# Patient Record
Sex: Male | Born: 1935 | Race: White | Hispanic: No | Marital: Married | Smoking: Former smoker
Health system: Southern US, Community
[De-identification: ages and names within clinical notes are randomized; demographics above are authoritative.]

## PROBLEM LIST (undated history)

## (undated) ENCOUNTER — Emergency Department (HOSPITAL_COMMUNITY): Admission: EM | Payer: Medicare Other | Source: Home / Self Care

## (undated) DIAGNOSIS — G4733 Obstructive sleep apnea (adult) (pediatric): Secondary | ICD-10-CM

## (undated) DIAGNOSIS — I219 Acute myocardial infarction, unspecified: Secondary | ICD-10-CM

## (undated) DIAGNOSIS — G4730 Sleep apnea, unspecified: Secondary | ICD-10-CM

## (undated) DIAGNOSIS — Z8719 Personal history of other diseases of the digestive system: Secondary | ICD-10-CM

## (undated) DIAGNOSIS — I82A12 Acute embolism and thrombosis of left axillary vein: Secondary | ICD-10-CM

## (undated) DIAGNOSIS — E7800 Pure hypercholesterolemia, unspecified: Secondary | ICD-10-CM

## (undated) DIAGNOSIS — I5030 Unspecified diastolic (congestive) heart failure: Secondary | ICD-10-CM

## (undated) DIAGNOSIS — G709 Myoneural disorder, unspecified: Secondary | ICD-10-CM

## (undated) DIAGNOSIS — I4891 Unspecified atrial fibrillation: Secondary | ICD-10-CM

## (undated) DIAGNOSIS — J069 Acute upper respiratory infection, unspecified: Secondary | ICD-10-CM

## (undated) DIAGNOSIS — N183 Chronic kidney disease, stage 3 unspecified: Secondary | ICD-10-CM

## (undated) DIAGNOSIS — J449 Chronic obstructive pulmonary disease, unspecified: Secondary | ICD-10-CM

## (undated) DIAGNOSIS — I2699 Other pulmonary embolism without acute cor pulmonale: Secondary | ICD-10-CM

## (undated) DIAGNOSIS — K254 Chronic or unspecified gastric ulcer with hemorrhage: Secondary | ICD-10-CM

## (undated) DIAGNOSIS — T7840XA Allergy, unspecified, initial encounter: Secondary | ICD-10-CM

## (undated) DIAGNOSIS — H269 Unspecified cataract: Secondary | ICD-10-CM

## (undated) DIAGNOSIS — R0602 Shortness of breath: Secondary | ICD-10-CM

## (undated) DIAGNOSIS — E119 Type 2 diabetes mellitus without complications: Secondary | ICD-10-CM

## (undated) DIAGNOSIS — E782 Mixed hyperlipidemia: Secondary | ICD-10-CM

## (undated) DIAGNOSIS — K219 Gastro-esophageal reflux disease without esophagitis: Secondary | ICD-10-CM

## (undated) DIAGNOSIS — C61 Malignant neoplasm of prostate: Secondary | ICD-10-CM

## (undated) DIAGNOSIS — Z5189 Encounter for other specified aftercare: Secondary | ICD-10-CM

## (undated) DIAGNOSIS — I5042 Chronic combined systolic (congestive) and diastolic (congestive) heart failure: Secondary | ICD-10-CM

## (undated) DIAGNOSIS — I2510 Atherosclerotic heart disease of native coronary artery without angina pectoris: Secondary | ICD-10-CM

## (undated) DIAGNOSIS — I10 Essential (primary) hypertension: Secondary | ICD-10-CM

## (undated) DIAGNOSIS — IMO0001 Reserved for inherently not codable concepts without codable children: Secondary | ICD-10-CM

## (undated) DIAGNOSIS — K269 Duodenal ulcer, unspecified as acute or chronic, without hemorrhage or perforation: Secondary | ICD-10-CM

## (undated) DIAGNOSIS — N200 Calculus of kidney: Secondary | ICD-10-CM

## (undated) HISTORY — DX: Obstructive sleep apnea (adult) (pediatric): G47.33

## (undated) HISTORY — DX: Allergy, unspecified, initial encounter: T78.40XA

## (undated) HISTORY — DX: Sleep apnea, unspecified: G47.30

## (undated) HISTORY — DX: Mixed hyperlipidemia: E78.2

## (undated) HISTORY — DX: Other pulmonary embolism without acute cor pulmonale: I26.99

## (undated) HISTORY — DX: Encounter for other specified aftercare: Z51.89

## (undated) HISTORY — DX: Gastro-esophageal reflux disease without esophagitis: K21.9

## (undated) HISTORY — DX: Chronic kidney disease, stage 3 unspecified: N18.30

## (undated) HISTORY — DX: Type 2 diabetes mellitus without complications: E11.9

## (undated) HISTORY — PX: CATARACT EXTRACTION W/ INTRAOCULAR LENS  IMPLANT, BILATERAL: SHX1307

## (undated) HISTORY — DX: Acute myocardial infarction, unspecified: I21.9

## (undated) HISTORY — PX: MELANOMA EXCISION: SHX5266

## (undated) HISTORY — DX: Chronic kidney disease, stage 3 (moderate): N18.3

## (undated) HISTORY — PX: SINUS ENDO W/FUSION: SHX777

## (undated) HISTORY — DX: Unspecified cataract: H26.9

## (undated) HISTORY — DX: Malignant neoplasm of prostate: C61

## (undated) HISTORY — DX: Chronic or unspecified gastric ulcer with hemorrhage: K25.4

## (undated) HISTORY — DX: Duodenal ulcer, unspecified as acute or chronic, without hemorrhage or perforation: K26.9

## (undated) HISTORY — DX: Unspecified atrial fibrillation: I48.91

## (undated) HISTORY — DX: Calculus of kidney: N20.0

---

## 1979-01-30 HISTORY — PX: URETEROSCOPY: SHX842

## 1984-05-31 HISTORY — PX: LOBECTOMY: SHX5089

## 1984-05-31 HISTORY — PX: LUNG SURGERY: SHX703

## 1990-05-31 DIAGNOSIS — C61 Malignant neoplasm of prostate: Secondary | ICD-10-CM

## 1990-05-31 HISTORY — DX: Malignant neoplasm of prostate: C61

## 2005-05-31 DIAGNOSIS — G4733 Obstructive sleep apnea (adult) (pediatric): Secondary | ICD-10-CM

## 2005-05-31 HISTORY — DX: Obstructive sleep apnea (adult) (pediatric): G47.33

## 2005-10-14 ENCOUNTER — Ambulatory Visit (HOSPITAL_COMMUNITY): Admission: RE | Admit: 2005-10-14 | Discharge: 2005-10-14 | Payer: Self-pay | Admitting: Urology

## 2007-10-20 ENCOUNTER — Encounter: Payer: Self-pay | Admitting: Internal Medicine

## 2008-01-18 ENCOUNTER — Encounter: Payer: Self-pay | Admitting: Internal Medicine

## 2008-02-29 ENCOUNTER — Encounter: Payer: Self-pay | Admitting: Internal Medicine

## 2008-04-04 ENCOUNTER — Encounter: Payer: Self-pay | Admitting: Internal Medicine

## 2008-04-11 ENCOUNTER — Encounter: Admission: RE | Admit: 2008-04-11 | Discharge: 2008-04-11 | Payer: Self-pay | Admitting: Internal Medicine

## 2008-05-13 ENCOUNTER — Ambulatory Visit: Payer: Self-pay | Admitting: Internal Medicine

## 2008-05-13 DIAGNOSIS — R599 Enlarged lymph nodes, unspecified: Secondary | ICD-10-CM | POA: Insufficient documentation

## 2008-05-13 DIAGNOSIS — K219 Gastro-esophageal reflux disease without esophagitis: Secondary | ICD-10-CM | POA: Insufficient documentation

## 2008-05-13 DIAGNOSIS — R0602 Shortness of breath: Secondary | ICD-10-CM | POA: Insufficient documentation

## 2008-05-13 DIAGNOSIS — R05 Cough: Secondary | ICD-10-CM

## 2008-05-13 DIAGNOSIS — G4733 Obstructive sleep apnea (adult) (pediatric): Secondary | ICD-10-CM | POA: Insufficient documentation

## 2008-05-13 DIAGNOSIS — R058 Other specified cough: Secondary | ICD-10-CM | POA: Insufficient documentation

## 2008-06-17 ENCOUNTER — Ambulatory Visit: Payer: Self-pay | Admitting: Gastroenterology

## 2008-06-17 ENCOUNTER — Ambulatory Visit: Payer: Self-pay | Admitting: Internal Medicine

## 2008-06-17 LAB — CONVERTED CEMR LAB
BUN: 17 mg/dL (ref 6–23)
Chloride: 105 meq/L (ref 96–112)
GFR calc non Af Amer: 58 mL/min
Glucose, Bld: 108 mg/dL — ABNORMAL HIGH (ref 70–99)
Potassium: 4.3 meq/L (ref 3.5–5.1)

## 2008-07-02 ENCOUNTER — Ambulatory Visit: Payer: Self-pay | Admitting: Gastroenterology

## 2008-07-03 ENCOUNTER — Telehealth: Payer: Self-pay | Admitting: Gastroenterology

## 2008-07-31 ENCOUNTER — Ambulatory Visit: Payer: Self-pay | Admitting: Gastroenterology

## 2008-07-31 DIAGNOSIS — K222 Esophageal obstruction: Secondary | ICD-10-CM | POA: Insufficient documentation

## 2008-08-05 ENCOUNTER — Ambulatory Visit: Payer: Self-pay | Admitting: Internal Medicine

## 2008-08-12 ENCOUNTER — Ambulatory Visit: Payer: Self-pay | Admitting: Internal Medicine

## 2008-08-12 DIAGNOSIS — J019 Acute sinusitis, unspecified: Secondary | ICD-10-CM | POA: Insufficient documentation

## 2008-09-03 ENCOUNTER — Encounter: Payer: Self-pay | Admitting: Internal Medicine

## 2008-10-15 ENCOUNTER — Encounter: Payer: Self-pay | Admitting: Internal Medicine

## 2008-11-11 ENCOUNTER — Telehealth: Payer: Self-pay | Admitting: Gastroenterology

## 2008-11-11 ENCOUNTER — Encounter: Payer: Self-pay | Admitting: Internal Medicine

## 2009-03-25 ENCOUNTER — Ambulatory Visit: Payer: Self-pay | Admitting: Internal Medicine

## 2009-03-25 LAB — CONVERTED CEMR LAB
BUN: 21 mg/dL (ref 6–23)
CO2: 27 meq/L (ref 19–32)
Chloride: 102 meq/L (ref 96–112)
Creatinine, Ser: 1.1 mg/dL (ref 0.4–1.5)

## 2009-03-26 ENCOUNTER — Ambulatory Visit: Payer: Self-pay | Admitting: Internal Medicine

## 2009-04-03 ENCOUNTER — Ambulatory Visit: Payer: Self-pay | Admitting: Internal Medicine

## 2009-04-15 ENCOUNTER — Encounter: Payer: Self-pay | Admitting: Internal Medicine

## 2009-06-17 ENCOUNTER — Ambulatory Visit (HOSPITAL_COMMUNITY): Admission: RE | Admit: 2009-06-17 | Discharge: 2009-06-17 | Payer: Self-pay | Admitting: Urology

## 2009-07-10 ENCOUNTER — Encounter: Payer: Self-pay | Admitting: Internal Medicine

## 2009-07-18 ENCOUNTER — Telehealth: Payer: Self-pay | Admitting: Internal Medicine

## 2009-07-18 ENCOUNTER — Encounter: Payer: Self-pay | Admitting: Internal Medicine

## 2009-09-10 ENCOUNTER — Ambulatory Visit: Payer: Self-pay | Admitting: Internal Medicine

## 2009-09-30 ENCOUNTER — Emergency Department (HOSPITAL_COMMUNITY): Admission: EM | Admit: 2009-09-30 | Discharge: 2009-09-30 | Payer: Self-pay | Admitting: Emergency Medicine

## 2009-10-08 ENCOUNTER — Encounter: Payer: Self-pay | Admitting: Internal Medicine

## 2009-10-10 ENCOUNTER — Telehealth: Payer: Self-pay | Admitting: Internal Medicine

## 2010-06-21 ENCOUNTER — Encounter: Payer: Self-pay | Admitting: Urology

## 2010-06-30 NOTE — Consult Note (Signed)
Summary: [facility]  [facility]   Imported By: Lester Melvindale 07/24/2009 07:46:47  _____________________________________________________________________  External Attachment:    Type:   Image     Comment:   External Document

## 2010-06-30 NOTE — Letter (Signed)
Summary: [facility]  [facility]   Imported By: Sherian Rein 11/03/2009 13:52:18  _____________________________________________________________________  External Attachment:    Type:   Image     Comment:   External Document

## 2010-06-30 NOTE — Progress Notes (Signed)
Summary: check on pt after ER visit  Phone Note Outgoing Call   Call placed by: Carron Curie CMA,  Oct 10, 2009 1:31 PM Call placed to: Patient Summary of Call: MR received an ER note stating pt came into [facility] ER on 10-01-09. MR wanted to check on pt. Pt states he went to ER because he had broken out in a rash and was itching, and thinks it was due to some meds. He was palced on prednisone and an antihist and staes he is doing much better. He also states his cough has imrpved since last OV as well.  Initial call taken by: Carron Curie CMA,  Oct 10, 2009 1:32 PM  Follow-up for Phone Call        thanks Follow-up by: Kalman Shan MD,  Oct 11, 2009 6:36 PM

## 2010-06-30 NOTE — Letter (Signed)
Summary: Flo Shanks MD  Flo Shanks MD   Imported By: Lester [place] 07/24/2009 07:51:13  _____________________________________________________________________  External Attachment:    Type:   Image     Comment:   External Document

## 2010-06-30 NOTE — Progress Notes (Signed)
Summary: return Dr.'s call  Phone Note From Other Clinic Call back at [phone]   Caller: Dr. Lazarus Salines Call For: Jon White Summary of Call: please call Dr. Lazarus Salines at office on Monday 07-21-2009 after 1:30pm.  Dr.wolicki aware MR not in office until Monday pm.  Offered to page MR for him. Initial call taken by: Eugene Gavia,  July 18, 2009 1:37 PM  Follow-up for Phone Call        will forward message to MR box to make him aware [address] [facility]  July 18, 2009 2:03 PM   Additional Follow-up for Phone Call Additional follow up Details #1::        I spoke to Dr. Lazarus Salines. His questionwas if sinus surgery would help his lungs. I reviewd past med hx on patient. His PFTs were normal but hi main issue sinus drainage exacerbating dyspna. DR Lazarus Salines said he will put that in context and decide on sinus surgery Additional Follow-up by: Kalman Shan MD,  July 18, 2009 4:54 PM

## 2010-06-30 NOTE — Assessment & Plan Note (Signed)
Summary: F/U pt requested this date///kp   Visit Type:  Follow-up Copy to:  Dr. Jolene Provost Primary Provider/Referring Provider:  Dr Georgianne Fick PMD, DR [facility], Dr. Arlyce Dice GI, Dr Bonneau Callas - Allergy  CC:  6 month follow-up.  Pt states sob and cough has improved. He states he is 90% better. [name] Kitchen  History of Present Illness: OV 09/02/2009: Followup for Multifactorial Cough (significant sinus drainage, GERD, and cough variant asthma),  Last visits March and November 2010. Since then has seen Dr. Lazarus Salines who is contemplating sinus surgery if medical Rx fails. He has continued nasal steroid ciclesonide, daily saline wash via nettpot. However, he did see Dr. Artesia Callas for allergy eval. Reportedly skin testing mildly positive for mold but otherwise negative. Was started on singulair. Afer that sinus drainage is "90% better". Able to cope with current levels of sinus drainage. Using tissue only 4-5 times per day as oppsed to >10-20 times per day. Re: GERD. He is not on PPI. Still having some cough and tickle in throat after he eats. Wants to get rid of it. Does not think this element of cough is related to sinus drainage. No other complaints. No dyspnea. Contnues with symbicort. Due to go back to [facility] for 5 months starting October 29, 2009  Current Medications (verified): 1)  Adult Aspirin Ec Low Strength 81 Mg Tbec (Aspirin) .... Once Daily 2)  Lipitor 10 Mg Tabs (Atorvastatin Calcium) .... Once Daily 3)  Symbicort 160-4.5 Mcg/act Aero (Budesonide-Formoterol Fumarate) .... 2 Puffs Twice Daily 4)  Omnaris 50 Mcg/act Susp (Ciclesonide) .... As Directed 5)  Singulair 10 Mg Tabs (Montelukast Sodium) .... Take 1 Tab By Mouth At Bedtime  Allergies (verified): 1)  ! 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  Spends winters in [place], [place] Has 2 kids in [place] Quit smoking only in 2009 Alcohol Use - yes Daily Caffeine Use  Risk Factors: Caffeine Use: 2 (06/17/2008)  Risk Factors: Smoking Status: quit (05/13/2008)  Past Medical History: #Sleep Apnea x on CPAP. Diagnosed in 2007. -> improved tolerance with chlorpheniramine 04/2008 #Prostate cancer 1992 x in remission. s/p XRT #Renal stones #Mediastinal Nodes > CT chest: 04/11/2008 at [place] imaging : Centrilobular emphysema,   Mild mediastinal and bihilar lymph nodes (9mm), 1cm subcarinal node (im 31), 2nd subcarinal node 9mm,  small hiatal hernia,  diffuse esophageal thickening > mediastinal nodes resolved on CT 03/26/2009  #LLL subpleural nodule > unchanged on CT 03/26/2009. 1 year stability  #Creat 1.3mg % on 04/04/2008  #Normal stress echo  #Allergy skin testing negative per hx...[name] KitchenMarland KitchenFeb 2011.[name] KitchenMarland KitchenDr Astor Callas  Past Surgical History: Reviewed history from 05/13/2008 and no changes required. Llung surgery 1986 - presumed from RLL. Segmentectomy. Hx is that of Solitary Nodule. Benign Nodule   Family History: Reviewed history from 06/17/2008 and no changes required. Family History of Prostate Cancer: 3 brothers/self No FH of Colon Cancer:  Social History: Reviewed history from 06/17/2008 and no changes required. 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  Spends winters in [place], [place] Has 2 kids in [place] Quit smoking only in 2009 Alcohol Use - yes Daily Caffeine Use  Review of Systems       The patient complains of shortness of breath with activity and non-productive cough.  The  patient denies shortness of breath at rest, productive cough, coughing up blood, chest pain, irregular heartbeats, acid heartburn, indigestion, loss of appetite, weight change, abdominal pain, difficulty swallowing, sore throat,  tooth/dental problems, headaches, nasal congestion/difficulty breathing through nose, sneezing, itching, ear ache, anxiety, depression, hand/feet swelling, joint stiffness or pain, rash, change in color of mucus, and fever.    Vital Signs:  Patient profile:   77 year old male Height:      68 inches Weight:      159.50 pounds BMI:     24.34 O2 Sat:      97 % on Room air Temp:     97.8 degrees F oral Pulse rate:   69 / minute BP sitting:   116 / 68  (right arm) Cuff size:   regular  Vitals Entered By: Carron Curie CMA (September 10, 2009 9:55 AM)  O2 Flow:  Room air CC: 6 month follow-up.  Pt states sob and cough has improved. He states he is 90% better.  Comments Medications reviewed with patient Carron Curie CMA  September 10, 2009 9:58 AM Daytime phone number verified with patient.    Physical Exam  General:  well developed, well nourished, in no acute distress Head:  normocephalic and atraumatic Eyes:  PERRLA/EOM intact; conjunctiva and sclera clear Ears:  TMs intact and clear with normal canals Nose:  no deformity, discharge, inflammation, or lesions Mouth:  dentures Melampatti Class II.   Neck:  no masses, thyromegaly, or abnormal cervical nodes Chest Wall:  no deformities noted Lungs:  clear bilaterally to auscultation and percussion Heart:  regular rate and rhythm, S1, S2 without murmurs, rubs, gallops, or clicks Abdomen:  bowel sounds positive; abdomen soft and non-tender without masses, or organomegaly Msk:  no deformity or scoliosis noted with normal posture Pulses:  pulses normal Extremities:  no clubbing, cyanosis, edema, or deformity noted Neurologic:  CN II-XII grossly intact with normal reflexes, coordination, muscle strength and tone Skin:  intact without lesions or rashes Cervical Nodes:  no significant adenopathy Axillary Nodes:  no significant adenopathy Psych:  alert and cooperative; normal mood and affect; normal attention span and  concentration   Impression & Recommendations:  Problem # 1:  COUGH (ICD-786.2) Assessment Improved  Etiologys a) elated to sinus drainage and mucus; b) Silent GERD  (he has hiatal hernia & esophagitis on ct chest 02/2008 + hx of hiccups + hx of intermittent dysphagia -.sp eso dilataition 07/2008; and c)  Cough variant asthma coexisting based on recurrence of chest tightness after dc of symbicort 07/2008  > On 09/10/2009: improved control after Dr. [place] Callas added singulair to regimen in Mid -feb 2011. Stil lhaving some cough that is likely due to GERD versus Habit cough. He is not on PPI and wonder why. He is not concerned about aspirin as cause and is continuing it  plan Sinus per Dr Lazarus Salines nad Dr. [place] Callas GI per Dr Arlyce Dice but will add PPI nexium Cough Variant asthma - contnue symbicort He will call in 1 month. If element of cough that is thought as due to GERD is betteer then we will continue PPI. Otherwise, will consider neurontin for habit cough Will get records from DR sharma  Orders: Est. Patient Level III (60454)  Medications Added to Medication List This Visit: 1)  Omnaris 50 Mcg/act Susp (Ciclesonide) .... As directed 2)  Singulair 10 Mg Tabs (Montelukast sodium) .... Take 1 tab by mouth at bedtime 3)  Nexium 40 Mg Cpdr (Esomeprazole magnesium) .... By  mouth daily. take one half hour before eating.  Other Orders: Prescription Created Electronically [phone])  Patient Instructions: 1)  continue your medicines 2)  add nexium to your current regimen 3)  take it on empty stomach 4)  follow GERD diet advice 5)  Call me in 1 month to see if cough even better 6)  Depending on your call I will advise you next step Prescriptions: NEXIUM 40 MG  CPDR (ESOMEPRAZOLE MAGNESIUM) By mouth daily. Take one half hour before eating.  #30 x 3   Entered and Authorized by:   Kalman Shan MD   Signed by:   Kalman Shan MD on 09/10/2009   Method used:   Electronically to        [facility]  [phone]* (retail)       [address]       [place], [place]  [address]       Ph: [phone] or [phone]       Fax: [phone]   RxID:   [id number]    Immunization History:  Pneumovax Immunization History:    Pneumovax:  pneumovax (08/30/2003)

## 2011-05-11 ENCOUNTER — Encounter: Payer: Self-pay | Admitting: Pulmonary Disease

## 2011-05-12 ENCOUNTER — Encounter: Payer: Self-pay | Admitting: Pulmonary Disease

## 2011-05-12 ENCOUNTER — Ambulatory Visit (INDEPENDENT_AMBULATORY_CARE_PROVIDER_SITE_OTHER): Payer: Medicare Other | Admitting: Pulmonary Disease

## 2011-05-12 VITALS — BP 108/68 | HR 67 | Temp 97.5°F | Ht 68.0 in | Wt 161.4 lb

## 2011-05-12 DIAGNOSIS — G4733 Obstructive sleep apnea (adult) (pediatric): Secondary | ICD-10-CM

## 2011-05-12 NOTE — Patient Instructions (Signed)
Will have your DME check your machine, mask fit, and possibly re-optimize your pressure with an "auto device".  Please call me after they download the auto device so that we can call and get them to fax the report.

## 2011-05-12 NOTE — Progress Notes (Signed)
Subjective:    Patient ID: Jon White, male    DOB: [birth year], 77 y.o.   MRN: [id number]  HPI The patient is a 77 year old male who I've been asked to see for management of obstructive sleep apnea.  The patient was diagnosed with mild sleep apnea in 2008, where he had an AHI of 11 events per hour.  He was started on CPAP optimized to 8 cm of water pressure, and did fairly well with the device.  About a year ago or more, his wife began to notice breakthrough snoring, and the patient began to feel unrested with inappropriate daytime sleepiness.  He believes that his machine is functioning properly, and he uses a full face mask with appropriate changing of cushions.  He does have a mask leak at times, but does not feel this is a big issue.  He typically will wear the CPAP at least 8 hours a night.  He does not feel rested in the mornings upon awakening, and notes significant daytime sleepiness with periods of inactivity during the day.  He will get sleepy reading or watching television if not stimulated.  He denies any sleepiness with driving.  The patient states that his weight is unchanged since his last study, and his Epworth score today is 8.  Sleep Questionnaire: What time do you typically go to bed?( Between what hours) 10 pm to 11 pm How long does it take you to fall asleep? 15 to 30 mins How many times during the night do you wake up? 2 What time do you get out of bed to start your day? 0900 Do you drive or operate heavy machinery in your occupation? No How much has your weight changed (up or down) over the past two years? (In pounds) 0 oz (0 kg) Have you ever had a sleep study before? Yes If yes, location of study? [facility] If yes, date of study? Feb 2008 Do you currently use CPAP? Yes If so, what pressure? 8 cm Do you wear oxygen at any time? No    Review of Systems  Constitutional: Negative for fever and unexpected weight change.  HENT: Positive for congestion, sneezing and  trouble swallowing. Negative for ear pain, nosebleeds, sore throat, rhinorrhea, dental problem, postnasal drip and sinus pressure.   Eyes: Negative for redness and itching.  Respiratory: Positive for shortness of breath. Negative for cough, chest tightness and wheezing.   Cardiovascular: Negative for palpitations and leg swelling.  Gastrointestinal: Negative for nausea and vomiting.  Genitourinary: Negative for dysuria.  Musculoskeletal: Positive for joint swelling.  Skin: Positive for rash.  Neurological: Negative for headaches.  Hematological: Does not bruise/bleed easily.  Psychiatric/Behavioral: Negative for dysphoric mood. The patient is not nervous/anxious.        Objective:   Physical Exam Constitutional:  Well developed, no acute distress  HENT:  Nares with narrowing, some septal deviation, obstruction due to debris and ?polyps?  Oropharynx without exudate, palate and uvula are elongated and thickened.   Eyes:  Perrla, eomi, no scleral icterus  Neck:  No JVD, no TMG  Cardiovascular:  Normal rate, regular rhythm, no rubs or gallops.  No murmurs        Intact distal pulses  Pulmonary :  Normal breath sounds, no stridor or respiratory distress   No rales, rhonchi, or wheezing  Abdominal:  Soft, nondistended, bowel sounds present.  No tenderness noted.   Musculoskeletal:  No significant lower extremity edema noted.  Lymph Nodes:  No cervical  lymphadenopathy noted  Skin:  No cyanosis noted  Neurologic:  Appears sleepy, but appropriate, moves all 4 extremities without obvious deficit.         Assessment & Plan:

## 2011-05-12 NOTE — Assessment & Plan Note (Signed)
The patient has a history of mild sleep apnea, and is being treated with CPAP.  He initially did very well with the device, but for at least the last year his wife has noticed breakthrough snoring, as well as increased daytime sleepiness.  It is unclear if this is due to a malfunction in his machine, a leak in the system, mask leaks, or possibly that he needs a higher pressure.  We'll have his DME check the functioning of his machine, as well as his mask fit.  If everything appears to be in order, will optimize his pressure for him at home with an automatic device for 2-3 weeks.  If he continues to have sleepiness during the day despite optimization of his sleep apnea treatment, would consider whether there are other issues leading to his daytime sleepiness.  He was found to have large numbers of leg jerks on his sleep study, and raises the question of a primary movement disorder of sleep.

## 2011-05-18 ENCOUNTER — Telehealth: Payer: Self-pay | Admitting: Pulmonary Disease

## 2011-05-18 NOTE — Telephone Encounter (Signed)
LMOAM for [facility] to return my call on status of order.

## 2011-05-18 NOTE — Telephone Encounter (Signed)
Per rhonda send to her and she will call [facility]

## 2011-05-19 NOTE — Telephone Encounter (Signed)
Spoke with Fayrene Fearing at [employer]. [facility] has been having problems with their fax. According to Fayrene Fearing he did not receive our order. I re-faxed the order and asked him to contact Mr. Cecena today to get this arranged. Fayrene Fearing stated that he would get this taken care of today. Called patient at the below number and went straight to voice mail. LMOAM for Mr. Coppedge to call me at [phone] if he didn't hear from [facility] by 3:00 pm today. Will follow back up with patient after 3:00. Fayrene Fearing with [facility] will send [place] a fax once the order has been addressed.

## 2011-05-20 NOTE — Telephone Encounter (Signed)
Spoke to pt he did hear from [facility]

## 2011-06-10 ENCOUNTER — Telehealth: Payer: Self-pay | Admitting: Pulmonary Disease

## 2011-06-10 DIAGNOSIS — G4733 Obstructive sleep apnea (adult) (pediatric): Secondary | ICD-10-CM

## 2011-06-10 NOTE — Telephone Encounter (Signed)
Pt is calling me to inform me [facility] did auto download so i can be on the look out for this.  LMOM for pt informing him I got this message and I will keep an eye out for his results and we will call him back.

## 2011-06-15 NOTE — Telephone Encounter (Signed)
Called [facility] at [phone] and [facility] requesting download as I still have not seen it come across my desk yet.

## 2011-06-16 NOTE — Telephone Encounter (Signed)
Received download from [facility] and put in [facility] VIP folder.   Called and spoke with pt's wife and informed her we received results and we will call her back once he reviews the results and let her know KC's recs.  Wife was ok with this.

## 2011-06-16 NOTE — Telephone Encounter (Signed)
Calling again in reference to previous message can be reached at [phone].Jon White

## 2011-06-17 NOTE — Telephone Encounter (Signed)
Please let pt know that his optimal pressure is 12cm.  Please let pcc know so they can send order to dme. Pt is to call us if he is not seeing improvement in his sleep on this pressure after 4 weeks.

## 2011-06-17 NOTE — Telephone Encounter (Signed)
Order sent to [facility] Spoke with pt and notified of recs per [facility] Pt verbalized understanding and is aware to call us after 4 wks if not improving with sleep.

## 2011-06-22 ENCOUNTER — Other Ambulatory Visit: Payer: Self-pay | Admitting: Pulmonary Disease

## 2011-06-22 DIAGNOSIS — G4733 Obstructive sleep apnea (adult) (pediatric): Secondary | ICD-10-CM

## 2011-06-30 ENCOUNTER — Other Ambulatory Visit (INDEPENDENT_AMBULATORY_CARE_PROVIDER_SITE_OTHER): Payer: Self-pay | Admitting: Otolaryngology

## 2011-06-30 DIAGNOSIS — J310 Chronic rhinitis: Secondary | ICD-10-CM | POA: Diagnosis not present

## 2011-06-30 DIAGNOSIS — J343 Hypertrophy of nasal turbinates: Secondary | ICD-10-CM | POA: Diagnosis not present

## 2011-06-30 DIAGNOSIS — J320 Chronic maxillary sinusitis: Secondary | ICD-10-CM | POA: Diagnosis not present

## 2011-06-30 DIAGNOSIS — J330 Polyp of nasal cavity: Secondary | ICD-10-CM | POA: Diagnosis not present

## 2011-07-05 ENCOUNTER — Ambulatory Visit
Admission: RE | Admit: 2011-07-05 | Discharge: 2011-07-05 | Disposition: A | Discharge status: Home / Self Care | Payer: Medicare Other | Source: Ambulatory Visit | Attending: Otolaryngology | Admitting: Otolaryngology

## 2011-07-05 DIAGNOSIS — J328 Other chronic sinusitis: Secondary | ICD-10-CM | POA: Diagnosis not present

## 2011-07-19 DIAGNOSIS — C61 Malignant neoplasm of prostate: Secondary | ICD-10-CM | POA: Diagnosis not present

## 2011-07-23 DIAGNOSIS — J343 Hypertrophy of nasal turbinates: Secondary | ICD-10-CM | POA: Diagnosis not present

## 2011-07-23 DIAGNOSIS — J342 Deviated nasal septum: Secondary | ICD-10-CM | POA: Diagnosis not present

## 2011-07-23 DIAGNOSIS — J330 Polyp of nasal cavity: Secondary | ICD-10-CM | POA: Diagnosis not present

## 2011-07-23 DIAGNOSIS — J320 Chronic maxillary sinusitis: Secondary | ICD-10-CM | POA: Diagnosis not present

## 2011-07-26 DIAGNOSIS — C61 Malignant neoplasm of prostate: Secondary | ICD-10-CM | POA: Diagnosis not present

## 2011-08-03 ENCOUNTER — Encounter (HOSPITAL_BASED_OUTPATIENT_CLINIC_OR_DEPARTMENT_OTHER): Payer: Self-pay | Admitting: *Deleted

## 2011-08-03 NOTE — Pre-Procedure Instructions (Signed)
BRING CPAP WITH YOU DOS AND ALL MEDS

## 2011-08-05 ENCOUNTER — Encounter (HOSPITAL_BASED_OUTPATIENT_CLINIC_OR_DEPARTMENT_OTHER)
Admission: RE | Admit: 2011-08-05 | Discharge: 2011-08-05 | Disposition: A | Discharge status: Home / Self Care | Payer: Medicare Other | Source: Ambulatory Visit | Attending: Otolaryngology | Admitting: Otolaryngology

## 2011-08-05 ENCOUNTER — Other Ambulatory Visit (HOSPITAL_COMMUNITY): Payer: Medicare Other

## 2011-08-05 ENCOUNTER — Other Ambulatory Visit: Payer: Self-pay

## 2011-08-05 ENCOUNTER — Ambulatory Visit
Admission: RE | Admit: 2011-08-05 | Discharge: 2011-08-05 | Disposition: A | Discharge status: Home / Self Care | Payer: Medicare Other | Source: Ambulatory Visit | Attending: Otolaryngology | Admitting: Otolaryngology

## 2011-08-05 ENCOUNTER — Other Ambulatory Visit (INDEPENDENT_AMBULATORY_CARE_PROVIDER_SITE_OTHER): Payer: Self-pay | Admitting: Otolaryngology

## 2011-08-05 DIAGNOSIS — Z01811 Encounter for preprocedural respiratory examination: Secondary | ICD-10-CM | POA: Diagnosis not present

## 2011-08-05 DIAGNOSIS — J342 Deviated nasal septum: Secondary | ICD-10-CM | POA: Diagnosis not present

## 2011-08-05 DIAGNOSIS — Z538 Procedure and treatment not carried out for other reasons: Secondary | ICD-10-CM | POA: Diagnosis not present

## 2011-08-05 DIAGNOSIS — Z01810 Encounter for preprocedural cardiovascular examination: Secondary | ICD-10-CM | POA: Diagnosis not present

## 2011-08-09 ENCOUNTER — Ambulatory Visit (HOSPITAL_BASED_OUTPATIENT_CLINIC_OR_DEPARTMENT_OTHER)
Admission: RE | Admit: 2011-08-09 | Discharge: 2011-08-09 | Disposition: A | Discharge status: Home / Self Care | Payer: Medicare Other | Source: Ambulatory Visit | Attending: Otolaryngology | Admitting: Otolaryngology

## 2011-08-09 ENCOUNTER — Encounter (HOSPITAL_BASED_OUTPATIENT_CLINIC_OR_DEPARTMENT_OTHER): Payer: Self-pay | Admitting: Anesthesiology

## 2011-08-09 ENCOUNTER — Other Ambulatory Visit: Payer: Self-pay | Admitting: Otolaryngology

## 2011-08-09 ENCOUNTER — Encounter (HOSPITAL_BASED_OUTPATIENT_CLINIC_OR_DEPARTMENT_OTHER)
Admission: RE | Disposition: A | Discharge status: Home / Self Care | Payer: Self-pay | Source: Ambulatory Visit | Attending: Otolaryngology

## 2011-08-09 ENCOUNTER — Encounter (HOSPITAL_BASED_OUTPATIENT_CLINIC_OR_DEPARTMENT_OTHER): Payer: Self-pay | Admitting: *Deleted

## 2011-08-09 DIAGNOSIS — Z538 Procedure and treatment not carried out for other reasons: Secondary | ICD-10-CM | POA: Insufficient documentation

## 2011-08-09 DIAGNOSIS — J342 Deviated nasal septum: Secondary | ICD-10-CM | POA: Insufficient documentation

## 2011-08-09 DIAGNOSIS — Z01810 Encounter for preprocedural cardiovascular examination: Secondary | ICD-10-CM | POA: Diagnosis not present

## 2011-08-09 HISTORY — DX: Chronic obstructive pulmonary disease, unspecified: J44.9

## 2011-08-09 SURGERY — SEPTOPLASTY, NOSE, WITH NASAL TURBINATE REDUCTION
Anesthesia: General | Laterality: Bilateral

## 2011-08-09 MED ORDER — LACTATED RINGERS IV SOLN: INTRAVENOUS | Status: DC

## 2011-08-09 SURGICAL SUPPLY — 65 items
ATTRACTOMAT 16X20 MAGNETIC DRP (DRAPES) IMPLANT
BLADE ROTATE RAD 12 4 M4 (BLADE) IMPLANT
BLADE ROTATE RAD 40 4 M4 (BLADE) IMPLANT
BLADE ROTATE TRICUT 4X13 M4 (BLADE) IMPLANT
BLADE SURG 15 STRL LF DISP TIS (BLADE) IMPLANT
BLADE SURG 15 STRL SS (BLADE)
BLADE TRICUT ROTATE M4 4 5PK (BLADE) IMPLANT
BUR HS RAD FRONTAL 3 (BURR) IMPLANT
CANISTER SUC SOCK COL 7 IN (MISCELLANEOUS) IMPLANT
CANISTER SUCTION 1200CC (MISCELLANEOUS) IMPLANT
CATH SINUS BALLN 7X16 (CATHETERS) IMPLANT
CATH SINUS BALLN RELIEV 6X16 (SINUPLASTY) IMPLANT
CATH SINUS GUIDE F-70 (CATHETERS) IMPLANT
CATH SINUS GUIDE M/110 (CATHETERS) IMPLANT
CATH SINUS IRRIGATION 2.0 (CATHETERS) IMPLANT
CLOTH BEACON ORANGE TIMEOUT ST (SAFETY) IMPLANT
COAGULATOR SUCT 8FR VV (MISCELLANEOUS) IMPLANT
DECANTER SPIKE VIAL GLASS SM (MISCELLANEOUS) IMPLANT
DEVICE INFLATION 20/61 (MISCELLANEOUS) IMPLANT
DRAPE SURG 17X23 STRL (DRAPES) IMPLANT
DRSG NASAL KENNEDY LMNT 8CM (GAUZE/BANDAGES/DRESSINGS) IMPLANT
DRSG NASOPORE 8CM (GAUZE/BANDAGES/DRESSINGS) IMPLANT
ELECT REM PT RETURN 9FT ADLT (ELECTROSURGICAL)
ELECTRODE REM PT RTRN 9FT ADLT (ELECTROSURGICAL) IMPLANT
GLOVE BIO SURGEON STRL SZ7.5 (GLOVE) IMPLANT
GOWN PREVENTION PLUS XLARGE (GOWN DISPOSABLE) IMPLANT
HANDLE SINUS GUIDE LP (INSTRUMENTS) IMPLANT
HANDPIECE HYDRODEBRIDER FRONT (BLADE) IMPLANT
HEMOSTAT SURGICEL 2X14 (HEMOSTASIS) IMPLANT
IV NS 1000ML (IV SOLUTION)
IV NS 1000ML BAXH (IV SOLUTION) IMPLANT
IV NS 500ML (IV SOLUTION)
IV NS 500ML BAXH (IV SOLUTION) IMPLANT
NEEDLE 27GAX1X1/2 (NEEDLE) IMPLANT
NEEDLE HYPO 25X1 1.5 SAFETY (NEEDLE) IMPLANT
NEEDLE SPNL 25GX3.5 QUINCKE BL (NEEDLE) IMPLANT
NS IRRIG 1000ML POUR BTL (IV SOLUTION) IMPLANT
PACK BASIN DAY SURGERY FS (CUSTOM PROCEDURE TRAY) IMPLANT
PACK ENT DAY SURGERY (CUSTOM PROCEDURE TRAY) IMPLANT
PAD ENT ADHESIVE 25PK (MISCELLANEOUS) IMPLANT
SHEATH ENDOSCRUB 0 DEG (SHEATH) IMPLANT
SHEATH ENDOSCRUB 30 DEG (SHEATH) IMPLANT
SLEEVE SCD COMPRESS KNEE MED (MISCELLANEOUS) IMPLANT
SOLUTION BUTLER CLEAR DIP (MISCELLANEOUS) IMPLANT
SPLINT NASAL DOYLE BI-VL (GAUZE/BANDAGES/DRESSINGS) IMPLANT
SPONGE GAUZE 2X2 8PLY STRL LF (GAUZE/BANDAGES/DRESSINGS) IMPLANT
SPONGE NEURO XRAY DETECT 1X3 (DISPOSABLE) IMPLANT
SUT CHROMIC 4 0 P 3 18 (SUTURE) IMPLANT
SUT ETHILON 3 0 PS 1 (SUTURE) IMPLANT
SUT PLAIN 4 0 ~~LOC~~ 1 (SUTURE) IMPLANT
SUT PROLENE 3 0 PS 2 (SUTURE) IMPLANT
SUT VIC AB 4-0 P-3 18XBRD (SUTURE) IMPLANT
SUT VIC AB 4-0 P3 18 (SUTURE)
SYR 3ML 23GX1 SAFETY (SYRINGE) IMPLANT
SYSTEM HYDRODEBRIDER (MISCELLANEOUS) IMPLANT
SYSTEM RELIEVA LUMA ILLUM (SINUPLASTY) IMPLANT
TOWEL OR 17X24 6PK STRL BLUE (TOWEL DISPOSABLE) IMPLANT
TRACKER ENT INSTRUMENT (MISCELLANEOUS) IMPLANT
TRACKER ENT PATIENT (MISCELLANEOUS) IMPLANT
TUBE CONNECTING 20X1/4 (TUBING) IMPLANT
TUBE SALEM SUMP 12R W/ARV (TUBING) IMPLANT
TUBE SALEM SUMP 16 FR W/ARV (TUBING) IMPLANT
TUBING STRAIGHTSHOT EPS 5PK (TUBING) IMPLANT
WATER STERILE IRR 1000ML POUR (IV SOLUTION) IMPLANT
YANKAUER SUCT BULB TIP NO VENT (SUCTIONS) IMPLANT

## 2011-08-09 NOTE — Progress Notes (Signed)
Patient cancelled at our facility by Dr. Gelene Mink due to airway safety.  Issues and concerns explained to patient and wife by Dr. Gelene Mink.  Patient and  Family understanding. Dr. Suszanne Conners informed by Dr. Gelene Mink.  Attempt to move patient to main OR schedule unsuccessful due to Dr. Suszanne Conners schedule.  Patient advised to call Dr. Suszanne Conners office later in day to reschedule surgery.

## 2011-08-09 NOTE — Consult Note (Signed)
In the course of my pre-op evaluation I concluded that Mr. Blann would require close post-op monitoring. Therefore, he is reccommended to be rescheduled at the main hospital where an appropriate unit exists for this need. Case discussed over the phone with Dr. Suszanne Conners. Family informed of need to delay to provide the patient with optimal care.  Aldona Lento, MD

## 2011-08-11 ENCOUNTER — Encounter (HOSPITAL_BASED_OUTPATIENT_CLINIC_OR_DEPARTMENT_OTHER): Payer: Self-pay | Admitting: Otolaryngology

## 2011-08-25 ENCOUNTER — Telehealth: Payer: Self-pay | Admitting: Pulmonary Disease

## 2011-08-25 DIAGNOSIS — G4733 Obstructive sleep apnea (adult) (pediatric): Secondary | ICD-10-CM

## 2011-08-25 NOTE — Telephone Encounter (Signed)
LMTCB for James 

## 2011-08-26 NOTE — Telephone Encounter (Signed)
Order sent to [facility] and Fayrene Fearing with [facility] aware.

## 2011-08-26 NOTE — Telephone Encounter (Signed)
Please have pcc check to see if insurance will allow a new machine.  If so, have them order my usual machine.

## 2011-08-26 NOTE — Telephone Encounter (Signed)
Pt is requesting a new cpap machine. States his is old and is not working well. Please advise if ok to place order. Carron Curie, CMA

## 2011-09-01 ENCOUNTER — Encounter (HOSPITAL_COMMUNITY): Payer: Self-pay | Admitting: Pharmacy Technician

## 2011-09-08 ENCOUNTER — Encounter (HOSPITAL_COMMUNITY): Payer: Self-pay

## 2011-09-08 ENCOUNTER — Encounter (HOSPITAL_COMMUNITY)
Admission: RE | Admit: 2011-09-08 | Discharge: 2011-09-08 | Disposition: A | Discharge status: Home / Self Care | Payer: Medicare Other | Source: Ambulatory Visit | Attending: Otolaryngology | Admitting: Otolaryngology

## 2011-09-08 DIAGNOSIS — J342 Deviated nasal septum: Secondary | ICD-10-CM | POA: Diagnosis not present

## 2011-09-08 DIAGNOSIS — Z01812 Encounter for preprocedural laboratory examination: Secondary | ICD-10-CM | POA: Diagnosis not present

## 2011-09-08 DIAGNOSIS — J339 Nasal polyp, unspecified: Secondary | ICD-10-CM | POA: Diagnosis not present

## 2011-09-08 DIAGNOSIS — J328 Other chronic sinusitis: Secondary | ICD-10-CM | POA: Diagnosis not present

## 2011-09-08 DIAGNOSIS — K219 Gastro-esophageal reflux disease without esophagitis: Secondary | ICD-10-CM | POA: Diagnosis not present

## 2011-09-08 DIAGNOSIS — J449 Chronic obstructive pulmonary disease, unspecified: Secondary | ICD-10-CM | POA: Diagnosis not present

## 2011-09-08 HISTORY — DX: Myoneural disorder, unspecified: G70.9

## 2011-09-08 HISTORY — DX: Acute upper respiratory infection, unspecified: J06.9

## 2011-09-08 HISTORY — DX: Reserved for inherently not codable concepts without codable children: IMO0001

## 2011-09-08 LAB — BASIC METABOLIC PANEL
BUN: 29 mg/dL — ABNORMAL HIGH (ref 6–23)
CO2: 24 mEq/L (ref 19–32)
Calcium: 9.7 mg/dL (ref 8.4–10.5)
Creatinine, Ser: 1.16 mg/dL (ref 0.50–1.35)
Glucose, Bld: 121 mg/dL — ABNORMAL HIGH (ref 70–99)
Sodium: 140 mEq/L (ref 135–145)

## 2011-09-08 LAB — CBC
MCH: 29.6 pg (ref 26.0–34.0)
MCHC: 33.9 g/dL (ref 30.0–36.0)
MCV: 87.4 fL (ref 78.0–100.0)
Platelets: 298 10*3/uL (ref 150–400)
RBC: 4.83 MIL/uL (ref 4.22–5.81)

## 2011-09-08 NOTE — Progress Notes (Signed)
Call to Selena Batten at Dr. Avel Sensor office,  Clarified order for consent.

## 2011-09-08 NOTE — Pre-Procedure Instructions (Signed)
20 Jon White  09/08/2011   Your procedure is scheduled on:  09/15/2011  Report to [facility] at 6:30 AM.  Call this number if you have problems the morning of surgery: [phone]   Remember:   Do not eat food:After Midnight.  May have clear liquids: up to 4 Hours before arrival.  Nothing after 2:30a.m.  Clear liquids include soda, tea, black coffee, apple or grape juice, broth.  Take these medicines the morning of surgery with A SIP OF WATER: inhaler & nasal spray   Do not wear jewelry, make-up or nail polish.  Do not wear lotions, powders, or perfumes. You may wear deodorant.  Do not shave 48 hours prior to surgery.  Do not bring valuables to the hospital.  Contacts, dentures or bridgework may not be worn into surgery.  Leave suitcase in the car. After surgery it may be brought to your room.  For patients admitted to the hospital, checkout time is 11:00 AM the day of discharge.   Patients discharged the day of surgery will not be allowed to drive home.  Name and phone number of your driver: with WIFE  Special Instructions: CHG Shower Use Special Wash: 1/2 bottle night before surgery and 1/2 bottle morning of surgery.   Please read over the following fact sheets that you were given: Pain Booklet, Coughing and Deep Breathing, MRSA Information and Surgical Site Infection Prevention

## 2011-09-08 NOTE — Progress Notes (Signed)
Call to AHannah Beat, PAC, reviewed case related to Dr. Avel Sensor order for anesth. Consult.

## 2011-09-09 ENCOUNTER — Emergency Department (HOSPITAL_COMMUNITY): Payer: Medicare Other

## 2011-09-09 ENCOUNTER — Emergency Department (HOSPITAL_COMMUNITY)
Admission: EM | Admit: 2011-09-09 | Discharge: 2011-09-10 | Disposition: A | Discharge status: Home / Self Care | Payer: Medicare Other | Attending: Emergency Medicine | Admitting: Emergency Medicine

## 2011-09-09 ENCOUNTER — Telehealth: Payer: Self-pay | Admitting: Pulmonary Disease

## 2011-09-09 ENCOUNTER — Encounter (HOSPITAL_COMMUNITY): Payer: Self-pay | Admitting: Emergency Medicine

## 2011-09-09 DIAGNOSIS — G4733 Obstructive sleep apnea (adult) (pediatric): Secondary | ICD-10-CM | POA: Diagnosis not present

## 2011-09-09 DIAGNOSIS — J449 Chronic obstructive pulmonary disease, unspecified: Secondary | ICD-10-CM | POA: Diagnosis not present

## 2011-09-09 DIAGNOSIS — D72829 Elevated white blood cell count, unspecified: Secondary | ICD-10-CM | POA: Diagnosis not present

## 2011-09-09 DIAGNOSIS — K5730 Diverticulosis of large intestine without perforation or abscess without bleeding: Secondary | ICD-10-CM | POA: Diagnosis not present

## 2011-09-09 DIAGNOSIS — Z87442 Personal history of urinary calculi: Secondary | ICD-10-CM | POA: Insufficient documentation

## 2011-09-09 DIAGNOSIS — Z87891 Personal history of nicotine dependence: Secondary | ICD-10-CM | POA: Insufficient documentation

## 2011-09-09 DIAGNOSIS — R1084 Generalized abdominal pain: Secondary | ICD-10-CM | POA: Insufficient documentation

## 2011-09-09 DIAGNOSIS — N269 Renal sclerosis, unspecified: Secondary | ICD-10-CM | POA: Diagnosis not present

## 2011-09-09 DIAGNOSIS — J438 Other emphysema: Secondary | ICD-10-CM | POA: Diagnosis not present

## 2011-09-09 DIAGNOSIS — J4489 Other specified chronic obstructive pulmonary disease: Secondary | ICD-10-CM | POA: Insufficient documentation

## 2011-09-09 DIAGNOSIS — R935 Abnormal findings on diagnostic imaging of other abdominal regions, including retroperitoneum: Secondary | ICD-10-CM | POA: Diagnosis not present

## 2011-09-09 LAB — URINALYSIS, ROUTINE W REFLEX MICROSCOPIC
Bilirubin Urine: NEGATIVE
Glucose, UA: NEGATIVE mg/dL
Ketones, ur: NEGATIVE mg/dL
Leukocytes, UA: NEGATIVE
Nitrite: NEGATIVE
Protein, ur: 30 mg/dL — AB
Specific Gravity, Urine: 1.023 (ref 1.005–1.030)
Urobilinogen, UA: 0.2 mg/dL (ref 0.0–1.0)
pH: 5.5 (ref 5.0–8.0)

## 2011-09-09 LAB — COMPREHENSIVE METABOLIC PANEL
ALT: 45 U/L (ref 0–53)
AST: 36 U/L (ref 0–37)
Albumin: 3.5 g/dL (ref 3.5–5.2)
Alkaline Phosphatase: 105 U/L (ref 39–117)
BUN: 26 mg/dL — ABNORMAL HIGH (ref 6–23)
CO2: 30 mEq/L (ref 19–32)
Calcium: 10.1 mg/dL (ref 8.4–10.5)
Chloride: 99 mEq/L (ref 96–112)
Creatinine, Ser: 1.18 mg/dL (ref 0.50–1.35)
GFR calc Af Amer: 67 mL/min — ABNORMAL LOW (ref >90)
GFR calc non Af Amer: 58 mL/min — ABNORMAL LOW (ref >90)
Glucose, Bld: 89 mg/dL (ref 70–99)
Potassium: 4.4 mEq/L (ref 3.5–5.1)
Sodium: 139 mEq/L (ref 135–145)
Total Bilirubin: 0.4 mg/dL (ref 0.3–1.2)
Total Protein: 8 g/dL (ref 6.0–8.3)

## 2011-09-09 LAB — URINE MICROSCOPIC-ADD ON

## 2011-09-09 LAB — CBC
HCT: 45.1 % (ref 39.0–52.0)
Hemoglobin: 15.2 g/dL (ref 13.0–17.0)
MCH: 30 pg (ref 26.0–34.0)
MCHC: 33.7 g/dL (ref 30.0–36.0)
MCV: 89.1 fL (ref 78.0–100.0)
Platelets: 345 10*3/uL (ref 150–400)
RBC: 5.06 MIL/uL (ref 4.22–5.81)
RDW: 15.8 % — ABNORMAL HIGH (ref 11.5–15.5)
WBC: 13.8 10*3/uL — ABNORMAL HIGH (ref 4.0–10.5)

## 2011-09-09 LAB — LIPASE, BLOOD: Lipase: 19 U/L (ref 11–59)

## 2011-09-09 MED ORDER — ONDANSETRON HCL 4 MG/2ML IJ SOLN
4.0000 mg | Freq: Once | INTRAMUSCULAR | Status: AC
  Administered 2011-09-09: 4 mg via INTRAVENOUS
  Filled 2011-09-09: qty 2

## 2011-09-09 MED ORDER — SODIUM CHLORIDE 0.9 % IV BOLUS (SEPSIS)
1000.0000 mL | Freq: Once | INTRAVENOUS | Status: AC
  Administered 2011-09-09: 1000 mL via INTRAVENOUS

## 2011-09-09 MED ORDER — MORPHINE SULFATE 4 MG/ML IJ SOLN
6.0000 mg | Freq: Once | INTRAMUSCULAR | Status: AC
  Administered 2011-09-09: 6 mg via INTRAVENOUS
  Filled 2011-09-09: qty 2

## 2011-09-09 NOTE — Telephone Encounter (Signed)
Will forward to Alida 

## 2011-09-09 NOTE — ED Notes (Signed)
MD at bedside. Dr. Kohut at bedside.  

## 2011-09-09 NOTE — ED Notes (Signed)
Pt alert, nad, c/o gen abd pain, onset several days ago, pt states "i took [place] a week aog, had a heck of a reaction, vomited everywhere", pt abd firm distended, denies no changes in bowel or bladder, pt resp even unlabored, skin pwd, denies n/v

## 2011-09-09 NOTE — ED Notes (Signed)
Pt states he has been a little nauseous, but denies vomiting.

## 2011-09-10 DIAGNOSIS — K5730 Diverticulosis of large intestine without perforation or abscess without bleeding: Secondary | ICD-10-CM | POA: Diagnosis not present

## 2011-09-10 DIAGNOSIS — N269 Renal sclerosis, unspecified: Secondary | ICD-10-CM | POA: Diagnosis not present

## 2011-09-10 DIAGNOSIS — R935 Abnormal findings on diagnostic imaging of other abdominal regions, including retroperitoneum: Secondary | ICD-10-CM | POA: Diagnosis not present

## 2011-09-10 MED ORDER — MORPHINE SULFATE 4 MG/ML IJ SOLN
4.0000 mg | Freq: Once | INTRAMUSCULAR | Status: AC
  Administered 2011-09-10: 4 mg via INTRAVENOUS
  Filled 2011-09-10: qty 1

## 2011-09-10 MED ORDER — IOHEXOL 300 MG/ML  SOLN
100.0000 mL | Freq: Once | INTRAMUSCULAR | Status: AC | PRN
  Administered 2011-09-10: 100 mL via INTRAVENOUS

## 2011-09-10 MED ORDER — FAMOTIDINE 20 MG PO TABS: 20.0000 mg | ORAL_TABLET | Freq: Two times a day (BID) | ORAL | Status: DC

## 2011-09-10 MED ORDER — ONDANSETRON HCL 4 MG/2ML IJ SOLN
4.0000 mg | Freq: Once | INTRAMUSCULAR | Status: AC
  Administered 2011-09-10: 4 mg via INTRAVENOUS
  Filled 2011-09-10: qty 2

## 2011-09-10 NOTE — ED Provider Notes (Signed)
History    78yM with abdominal pain. Gradual onset about 3 ago. Initially intermittent and now more or less constant. Achy and diffuse. No appreciable exacerbating or relieving factors. No fever or chills. Nausea, but no vomiting aside from about a week ago after taking an aleve and this was several days prior to presenting complaint. No urinary complaints. No diarrhea. No cp or sob. Denies hx of abdominal surgery. No fever or chills.  CSN: [id number]  Arrival date & time 09/09/11  1836   First MD Initiated Contact with Patient 09/09/11 2130      Chief Complaint  Patient presents with  . Abdominal Pain    (Consider location/radiation/quality/duration/timing/severity/associated sxs/prior treatment) HPI  Past Medical History  Diagnosis Date  . Prostate cancer 1992    in remission.  s/p XRT  . Emphysema   . Allergic rhinitis   . Asthma   . COPD (chronic obstructive pulmonary disease)   . Shortness of breath   . Recurrent upper respiratory infection (URI)     coughing from chronic sinusitis   . Normal cardiac stress test     reports stress test was several yrs. ago /w [employer]. Dr. Darlin Priestly, WNL   . OSA (obstructive sleep apnea) 2007    CPAP- report of settings on paper chart, seen by [facility] - seen 07/2011   . Renal stone     some passed spontaneous, & also had cystoscopy  . Arthritis     hands  . Neuromuscular disorder     reports shaking, somedays "can't hold spoon" - generalized  shakiness, is going to talk to MD about it at next appt.     Past Surgical History  Procedure Date  . Lung surgery 1986    presumed from RLL. Segmentectomy. Benign Nodule.   . Cataract extraction, bilateral     /w IOL  . Nasal septoplasty w/ turbinoplasty 09/15/2011    Procedure: NASAL SEPTOPLASTY WITH TURBINATE REDUCTION;  Surgeon: Darletta Moll, MD;  Location: [facility];  Service: ENT;  Laterality: Bilateral;  . Sinus endo w/fusion sch. 09/15/2011    Procedure: ENDOSCOPIC  SINUS SURGERY WITH FUSION NAVIGATION;  Surgeon: Darletta Moll, MD;  Location: [facility];  Service: ENT;  Laterality: Bilateral;  bilateral maxillary antrostomy, bilateral endoscopic ethmoidectomy, bilateral sphenoidectomy, bilateral frontal recess exploration    Family History  Problem Relation Age of Onset  . Prostate cancer Brother   . Prostate cancer Brother   . Prostate cancer Brother   . Asthma Father   . Heart disease Mother   . Anesthesia problems Neg Hx     History  Substance Use Topics  . Smoking status: Former Smoker -- 1.0 packs/day for 60 years    Types: Cigarettes    Quit date: 08/06/2011  . Smokeless tobacco: Not on file   Comment: states now he rarely smokes.  approx 2 to 3 cigs daily.   . Alcohol Use: Yes     SOCIAL      Review of Systems   Review of symptoms negative unless otherwise noted in HPI.   Allergies  Aleve and Nasonex  Home Medications   Current Outpatient Rx  Name Route Sig Dispense Refill  . ALBUTEROL SULFATE HFA 108 (90 BASE) MCG/ACT IN AERS Inhalation Inhale 2 puffs into the lungs every 6 (six) hours as needed. For shortness of breath    . ATORVASTATIN CALCIUM 10 MG PO TABS Oral Take 10 mg by mouth daily. Last taken 09/01/2011  to see if he went off med. If the rash- legs & arms would resolve.  Since stopping med., rash has cleared    . BUDESONIDE-FORMOTEROL FUMARATE 160-4.5 MCG/ACT IN AERO Inhalation Inhale 2 puffs into the lungs 2 (two) times daily.     [name] Kitchen HYDROXYZINE HCL 25 MG PO TABS Oral Take 25 mg by mouth 3 (three) times daily.     [name] Kitchen PREDNISONE (PAK) 10 MG PO TABS Oral Take 10 mg by mouth as directed. First dose was on 09/07/11    . AZELASTINE HCL 137 MCG/SPRAY NA SOLN Nasal Place 1 spray into the nose 2 (two) times daily. Use in each nostril as directed      BP 120/61  Pulse 94  Temp(Src) 98.3 F (36.8 C) (Oral)  Resp 15  Wt 157 lb (71.215 kg)  SpO2 96%  Physical Exam  Nursing note and vitals  reviewed. Constitutional: He appears well-developed and well-nourished. No distress.  HENT:  Head: Normocephalic and atraumatic.  Eyes: Conjunctivae are normal. Right eye exhibits no discharge. Left eye exhibits no discharge.  Neck: Neck supple.  Cardiovascular: Normal rate, regular rhythm and normal heart sounds.  Exam reveals no gallop and no friction rub.   No murmur heard. Pulmonary/Chest: Effort normal and breath sounds normal. No respiratory distress.  Abdominal: Soft. He exhibits no distension.       Abdomen is soft. Not firm as mentioned in triage note. Possibly somewhat distended, but per pt is usual appearance. No surgical scars noted or concerning skin lesions. Nontender. No mass palpated.  Genitourinary:       No cva tenderness.  Musculoskeletal: He exhibits no edema and no tenderness.  Neurological: He is alert.  Skin: Skin is warm and dry.  Psychiatric: He has a normal mood and affect. His behavior is normal. Thought content normal.    ED Course  Procedures (including critical care time)  Labs Reviewed  CBC - Abnormal; Notable for the following:    WBC 13.8 (*)    RDW 15.8 (*)    All other components within normal limits  URINALYSIS, ROUTINE W REFLEX MICROSCOPIC - Abnormal; Notable for the following:    Hgb urine dipstick TRACE (*)    Protein, ur 30 (*)    All other components within normal limits  COMPREHENSIVE METABOLIC PANEL - Abnormal; Notable for the following:    BUN 26 (*)    GFR calc non Af Amer 58 (*)    GFR calc Af Amer 67 (*)    All other components within normal limits  URINE MICROSCOPIC-ADD ON - Abnormal; Notable for the following:    Bacteria, UA FEW (*)    All other components within normal limits  LIPASE, BLOOD   Ct Abdomen Pelvis W Contrast  09/10/2011  *RADIOLOGY REPORT*  Clinical Data: Generalized abdominal pain onset several days ago. History prostate cancer and renal stones.  CT ABDOMEN AND PELVIS WITH CONTRAST  Technique:  Multidetector CT  imaging of the abdomen and pelvis was performed following the standard protocol during bolus administration of intravenous contrast.  Contrast: OMNIPAQUE IOHEXOL 300 MG/ML  SOLN  Comparison: No comparison abdominal/pelvic CT.  Comparison chest CT 03/26/2009.  Findings: Scarring lung bases with tiny nodule peripheral left lung base stable.  Small hiatal hernia.  Mild fullness at the level of the hiatal hernia may be related to changes of reflux rather than mass as this does not appear significantly different than the prior exam.  Mild thickening of the second portion  of the duodenum may be related to duodenitis with inflammation.  Diverticula lateral aspect of the second portion of the duodenum.  No findings of perforation.  Fatty liver without focal hepatic lesion.  No calcified gallstones.  Very small spleen without focal mass.  No pancreatic mass or adrenal lesion.  Scarring of the left kidney.  Fullness of the left collecting system.  No obstructing lesion identified.  This mild left renal collecting system fullness may be chronic.  Clinical correlation recommended.  Nonobstructing left renal calculus.  Bilateral renal cysts.  Sigmoid and descending colon diverticula without extraluminal inflammation.  Cecum displaced medially.  Appendix not visualized.  Atherosclerotic type changes of the aorta with mild ectasia without aneurysmal dilation.  Mild narrowing celiac artery. Atherosclerotic type changes iliac arteries.  Noncontrast filled views of the urinary bladder unremarkable. Prostate gland slightly enlarged.  Clinical and laboratory correlation recommended.  No adenopathy.  No bony destructive lesion.  IMPRESSION: Mild thickening of the second portion of the duodenum may be related to duodenitis with inflammation.  Scarring of the left kidney.  Fullness of the left collecting system.  No obstructing lesion identified.  This mild left renal collecting system fullness may be chronic.  Small hiatal hernia  with mild mucosal thickening may be related to reflux and unchanged from prior exam.  Descending colon and sigmoid colon diverticulosis.  Appendix not visualized.  Atherosclerotic type changes as detailed above.  Original Report Authenticated By: Fuller Canada, M.D.     1. Abdominal pain     1:23 AM  pt and wife updated on results. Mild pain returning but not as bad when presented. Small dose pain meds prior to DC. Repeat exam benign.   MDM  [phone] with abdominal pain. Labs relatively unremarkable aside from leukocytosis which improved from yesterday.  Apparently done as pre-op for upcoming sinus surgery. Benign exam but scanned given age and at increased risk for morbidity/mortality. Possible mild duodenitis. Will give trial of h2 blocker. Repeat abdominal exam prior to DC remains benign. Return precautions discussed. Outpt fu.         Raeford Razor, MD 09/10/11 [phone]

## 2011-09-10 NOTE — Discharge Instructions (Signed)
Abdominal Pain Abdominal pain can be caused by many things. Your caregiver decides the seriousness of your pain by an examination and possibly blood tests and X-rays. Many cases can be observed and treated at home. Most abdominal pain is not caused by a disease and will probably improve without treatment. However, in many cases, more time must pass before a clear cause of the pain can be found. Before that point, it may not be known if you need more testing, or if hospitalization or surgery is needed. HOME CARE INSTRUCTIONS   Do not take laxatives unless directed by your caregiver.   Take pain medicine only as directed by your caregiver.   Only take over-the-counter or prescription medicines for pain, discomfort, or fever as directed by your caregiver.   Try a clear liquid diet (broth, tea, or water) for as long as directed by your caregiver. Slowly move to a bland diet as tolerated.  SEEK IMMEDIATE MEDICAL CARE IF:   The pain does not go away.   You have a fever.   You keep throwing up (vomiting).   The pain is felt only in portions of the abdomen. Pain in the right side could possibly be appendicitis. In an adult, pain in the left lower portion of the abdomen could be colitis or diverticulitis.   You pass bloody or black tarry stools.  MAKE SURE YOU:   Understand these instructions.   Will watch your condition.   Will get help right away if you are not doing well or get worse.  Document Released: 02/24/2005 Document Revised: 05/06/2011 Document Reviewed: 01/03/2008 ExitCare Patient Information 2012 [employer].  RESOURCE GUIDE  Dental Problems  Patients with Medicaid: [facility]                      Dental [address]                                           [address] Phone:  [phone]                                                  Phone:  [phone]  If unable to pay or uninsured, contact:  [facility] or [place] [facility] to become qualified for the adult dental clinic.  Chronic Pain Problems Contact [facility]  [phone] Patients need to be referred by their primary care doctor.  Insufficient Money for Medicine Contact [employer]:  call "211" or [facility] 271-5999.  No Primary Care Doctor Call [facility]  832-8000 Other agencies that provide inexpensive medical care    [facility]  [phone]    [facility]  [phone]    [facility]  [phone]    Women[facility] Clinic  [phone]    [facility]  [phone]    [facility]  [phone]  Psychological Services [facility]  [phone] [facility]  [phone] [facility]   800 853-5163 (emergency services 641-4993)  Substance Abuse Resources [facility]  336-882-2125 [facility] 336-784-9470 [facility] 336-285-9073 [facility] 336-845-3988 Residential & Outpatient Substance Abuse Program  800-659-3381    Abuse/Neglect [place] Child Abuse Hotline (336) 641-3795 [place] Child Abuse Hotline 800-378-5315 (After Hours)  Emergency Shelter  [facility] (336) 271-5985  [facility] (336) 275-9566 [facility] (704) 372-4663  MRSA Hotline #:   832-7006    [place] Resources  [facility] [place]     [employer]                          [facility] [address] [place]                       [address]      [address]  [place]                                                [place]                            [place] Phone:  [phone]                                   Phone:  [phone]                 Phone:  [phone]  [facility] Phone:  [phone]  [place] Child Abuse Hotline (336) 342-1394 (336) 342-3537 (After  Hours)   

## 2011-09-10 NOTE — Consult Note (Signed)
Anesthesia:  Patient is a 77 year old male scheduled for nasal septoplasty, bilateral turbinate reduction, endoscopic sinus surgery on 09/15/11.  He was initially scheduled to have this done at [facility], but due to his pulmonary history was referred to the main OR at [facility].  Additional history includes former smoker, prostate cancer, COPD/emphysema, OSA with CPAP use (Dr. Shelle Iron), arthritis, kidney stones, RLL segmentectomy for a benign nodule '86.  Of note he was seen in the ED yesterday, 09/09/10, for abdominal pain and diagnosed with mild duodenitis by CT and was discharged home on H2 blocker.  PCP is Dr. Georgianne Fick.    Labs reviewed from 09/08/11 and 09/09/11.  WBC on 09/09/11 was 13.8K, but down from 15.1 on 09/08/11. Cr, H/H, PLT WNL.  CXR finding on 08/05/11 showed Resection of the posterior aspect of the right sixth rib and sutures from wedge resection in the lateral aspect of the right lower lobe are again noted. Lungs are hyperexpanded with flattening of the hemidiaphragms, increased retrosternal air space  and slight pruning of the pulmonary vasculature in the periphery, suggestive of underlying COPD. No focal airspace consolidation. No definite suspicious appearing pulmonary nodules or masses are identified. Heart size is normal. No evidence of pulmonary edema. Mediastinal contours are unremarkable.    EKG on 08/05/11 showed NSR, right axis deviation, pulmonary disease pattern.  It was felt stable from prior.  Reportedly he had a normal stress test at his PCP's office "several" years ago.  If no acute changes or worsening abdominal pain and remains stable from a cardiopulmonary standpoint, then anticipate he can proceed.

## 2011-09-10 NOTE — ED Notes (Signed)
Patient transported to CT 

## 2011-09-10 NOTE — Telephone Encounter (Signed)
I do not have a cmn for this patient, called [facility] and left msg for Fayrene Fearing to resend cmn,.  Pt sees Dr Shelle Iron for sleep.

## 2011-09-13 DIAGNOSIS — R109 Unspecified abdominal pain: Secondary | ICD-10-CM | POA: Diagnosis not present

## 2011-09-13 DIAGNOSIS — J329 Chronic sinusitis, unspecified: Secondary | ICD-10-CM | POA: Diagnosis not present

## 2011-09-13 DIAGNOSIS — K2980 Duodenitis without bleeding: Secondary | ICD-10-CM | POA: Diagnosis not present

## 2011-09-15 ENCOUNTER — Encounter (HOSPITAL_COMMUNITY): Payer: Self-pay | Admitting: Vascular Surgery

## 2011-09-15 ENCOUNTER — Ambulatory Visit (HOSPITAL_COMMUNITY)
Admission: RE | Admit: 2011-09-15 | Discharge: 2011-09-16 | Disposition: A | Discharge status: Home / Self Care | Payer: Medicare Other | Source: Ambulatory Visit | Attending: Otolaryngology | Admitting: Otolaryngology

## 2011-09-15 ENCOUNTER — Encounter (HOSPITAL_COMMUNITY): Payer: Self-pay | Admitting: *Deleted

## 2011-09-15 ENCOUNTER — Encounter (HOSPITAL_COMMUNITY): Payer: Self-pay | Admitting: General Practice

## 2011-09-15 ENCOUNTER — Encounter (HOSPITAL_COMMUNITY)
Admission: RE | Disposition: A | Discharge status: Home / Self Care | Payer: Self-pay | Source: Ambulatory Visit | Attending: Otolaryngology

## 2011-09-15 ENCOUNTER — Ambulatory Visit (HOSPITAL_COMMUNITY): Payer: Medicare Other | Admitting: Vascular Surgery

## 2011-09-15 DIAGNOSIS — J343 Hypertrophy of nasal turbinates: Secondary | ICD-10-CM | POA: Diagnosis not present

## 2011-09-15 DIAGNOSIS — J342 Deviated nasal septum: Secondary | ICD-10-CM | POA: Diagnosis not present

## 2011-09-15 DIAGNOSIS — J321 Chronic frontal sinusitis: Secondary | ICD-10-CM | POA: Diagnosis not present

## 2011-09-15 DIAGNOSIS — J328 Other chronic sinusitis: Secondary | ICD-10-CM | POA: Diagnosis not present

## 2011-09-15 DIAGNOSIS — J329 Chronic sinusitis, unspecified: Secondary | ICD-10-CM | POA: Diagnosis not present

## 2011-09-15 DIAGNOSIS — Z01812 Encounter for preprocedural laboratory examination: Secondary | ICD-10-CM | POA: Insufficient documentation

## 2011-09-15 DIAGNOSIS — J4489 Other specified chronic obstructive pulmonary disease: Secondary | ICD-10-CM | POA: Insufficient documentation

## 2011-09-15 DIAGNOSIS — J019 Acute sinusitis, unspecified: Secondary | ICD-10-CM

## 2011-09-15 DIAGNOSIS — J339 Nasal polyp, unspecified: Secondary | ICD-10-CM | POA: Diagnosis not present

## 2011-09-15 DIAGNOSIS — J449 Chronic obstructive pulmonary disease, unspecified: Secondary | ICD-10-CM | POA: Diagnosis not present

## 2011-09-15 DIAGNOSIS — J338 Other polyp of sinus: Secondary | ICD-10-CM | POA: Diagnosis not present

## 2011-09-15 DIAGNOSIS — K219 Gastro-esophageal reflux disease without esophagitis: Secondary | ICD-10-CM | POA: Diagnosis not present

## 2011-09-15 DIAGNOSIS — J320 Chronic maxillary sinusitis: Secondary | ICD-10-CM | POA: Diagnosis not present

## 2011-09-15 DIAGNOSIS — J323 Chronic sphenoidal sinusitis: Secondary | ICD-10-CM | POA: Diagnosis not present

## 2011-09-15 DIAGNOSIS — J322 Chronic ethmoidal sinusitis: Secondary | ICD-10-CM | POA: Diagnosis not present

## 2011-09-15 HISTORY — DX: Shortness of breath: R06.02

## 2011-09-15 HISTORY — PX: SPHENOIDECTOMY: SHX2421

## 2011-09-15 HISTORY — DX: Personal history of other diseases of the digestive system: Z87.19

## 2011-09-15 HISTORY — PX: MAXILLARY ANTROSTOMY: SHX2003

## 2011-09-15 HISTORY — PX: SEPTOPLASTY: SHX2393

## 2011-09-15 HISTORY — PX: SEPTOPLASTY: SUR1290

## 2011-09-15 HISTORY — PX: SINUS EXPLORATION: SHX5214

## 2011-09-15 HISTORY — PX: SINUS ENDO W/FUSION: SHX777

## 2011-09-15 HISTORY — DX: Pure hypercholesterolemia, unspecified: E78.00

## 2011-09-15 SURGERY — SINUS SURGERY, ENDOSCOPIC, USING COMPUTER-ASSISTED NAVIGATION
Anesthesia: General | Site: Nose | Laterality: Bilateral | Wound class: Clean Contaminated

## 2011-09-15 MED ORDER — ZOLPIDEM TARTRATE 5 MG PO TABS: 5.0000 mg | ORAL_TABLET | Freq: Every evening | ORAL | Status: DC | PRN

## 2011-09-15 MED ORDER — PROPOFOL 10 MG/ML IV BOLUS
INTRAVENOUS | Status: DC | PRN
  Administered 2011-09-15: 150 mg via INTRAVENOUS

## 2011-09-15 MED ORDER — KCL IN DEXTROSE-NACL 20-5-0.45 MEQ/L-%-% IV SOLN
INTRAVENOUS | Status: DC
  Administered 2011-09-15: 18:00:00 via INTRAVENOUS
  Filled 2011-09-15 (×3): qty 1000

## 2011-09-15 MED ORDER — ALBUTEROL SULFATE HFA 108 (90 BASE) MCG/ACT IN AERS
2.0000 | INHALATION_SPRAY | Freq: Four times a day (QID) | RESPIRATORY_TRACT | Status: DC | PRN
  Filled 2011-09-15: qty 6.7

## 2011-09-15 MED ORDER — FENTANYL CITRATE 0.05 MG/ML IJ SOLN: 50.0000 ug | INTRAMUSCULAR | Status: DC | PRN

## 2011-09-15 MED ORDER — PROMETHAZINE HCL 25 MG PO TABS: 25.0000 mg | ORAL_TABLET | Freq: Four times a day (QID) | ORAL | Status: DC | PRN

## 2011-09-15 MED ORDER — HYDROCODONE-ACETAMINOPHEN 5-325 MG PO TABS: 1.0000 | ORAL_TABLET | ORAL | Status: DC | PRN

## 2011-09-15 MED ORDER — SODIUM CHLORIDE 0.9 % IV SOLN
500.0000 mg | Freq: Four times a day (QID) | INTRAVENOUS | Status: DC
  Administered 2011-09-15 – 2011-09-16 (×3): 500 mg via INTRAVENOUS
  Filled 2011-09-15 (×6): qty 500

## 2011-09-15 MED ORDER — SODIUM CHLORIDE 0.9 % IR SOLN
Status: DC | PRN
  Administered 2011-09-15: 1000 mL

## 2011-09-15 MED ORDER — MORPHINE SULFATE 4 MG/ML IJ SOLN: 2.0000 mg | INTRAMUSCULAR | Status: DC | PRN

## 2011-09-15 MED ORDER — OXYMETAZOLINE HCL 0.05 % NA SOLN
NASAL | Status: DC | PRN
  Administered 2011-09-15: 1 via NASAL

## 2011-09-15 MED ORDER — PROMETHAZINE HCL 25 MG RE SUPP: 25.0000 mg | Freq: Four times a day (QID) | RECTAL | Status: DC | PRN

## 2011-09-15 MED ORDER — FAMOTIDINE 20 MG PO TABS
20.0000 mg | ORAL_TABLET | Freq: Two times a day (BID) | ORAL | Status: DC
  Administered 2011-09-15 – 2011-09-16 (×2): 20 mg via ORAL
  Filled 2011-09-15 (×3): qty 1

## 2011-09-15 MED ORDER — GLYCOPYRROLATE 0.2 MG/ML IJ SOLN
INTRAMUSCULAR | Status: DC | PRN
  Administered 2011-09-15: .6 mg via INTRAVENOUS

## 2011-09-15 MED ORDER — ONDANSETRON HCL 4 MG/2ML IJ SOLN
INTRAMUSCULAR | Status: DC | PRN
  Administered 2011-09-15: 4 mg via INTRAVENOUS

## 2011-09-15 MED ORDER — CEFAZOLIN SODIUM 1-5 GM-% IV SOLN
INTRAVENOUS | Status: AC
  Filled 2011-09-15: qty 100

## 2011-09-15 MED ORDER — LORAZEPAM 2 MG/ML IJ SOLN: 1.0000 mg | Freq: Once | INTRAMUSCULAR | Status: DC | PRN

## 2011-09-15 MED ORDER — BUDESONIDE-FORMOTEROL FUMARATE 160-4.5 MCG/ACT IN AERO
2.0000 | INHALATION_SPRAY | Freq: Two times a day (BID) | RESPIRATORY_TRACT | Status: DC
  Administered 2011-09-15 – 2011-09-16 (×2): 2 via RESPIRATORY_TRACT
  Filled 2011-09-15: qty 6

## 2011-09-15 MED ORDER — HYDROXYZINE HCL 25 MG PO TABS
25.0000 mg | ORAL_TABLET | Freq: Three times a day (TID) | ORAL | Status: DC
  Administered 2011-09-15 – 2011-09-16 (×3): 25 mg via ORAL
  Filled 2011-09-15 (×5): qty 1

## 2011-09-15 MED ORDER — ACETAMINOPHEN 650 MG RE SUPP: 650.0000 mg | RECTAL | Status: DC | PRN

## 2011-09-15 MED ORDER — ACETAMINOPHEN 160 MG/5ML PO SOLN
650.0000 mg | ORAL | Status: DC | PRN
Start: 2011-09-15 — End: 2011-09-16

## 2011-09-15 MED ORDER — MIDAZOLAM HCL 2 MG/2ML IJ SOLN: 1.0000 mg | INTRAMUSCULAR | Status: DC | PRN

## 2011-09-15 MED ORDER — BACITRACIN ZINC 500 UNIT/GM EX OINT
TOPICAL_OINTMENT | CUTANEOUS | Status: DC | PRN
  Administered 2011-09-15: 1 via TOPICAL

## 2011-09-15 MED ORDER — NEOSTIGMINE METHYLSULFATE 1 MG/ML IJ SOLN
INTRAMUSCULAR | Status: DC | PRN
  Administered 2011-09-15: 3 mg via INTRAVENOUS

## 2011-09-15 MED ORDER — LACTATED RINGERS IV SOLN
INTRAVENOUS | Status: DC | PRN
  Administered 2011-09-15 (×2): via INTRAVENOUS

## 2011-09-15 MED ORDER — HYDROMORPHONE HCL PF 1 MG/ML IJ SOLN
0.2500 mg | INTRAMUSCULAR | Status: DC | PRN
  Administered 2011-09-15 (×2): 0.5 mg via INTRAVENOUS

## 2011-09-15 MED ORDER — FENTANYL CITRATE 0.05 MG/ML IJ SOLN
INTRAMUSCULAR | Status: DC | PRN
  Administered 2011-09-15 (×2): 50 ug via INTRAVENOUS
  Administered 2011-09-15: 150 ug via INTRAVENOUS

## 2011-09-15 MED ORDER — CEFAZOLIN SODIUM 1-5 GM-% IV SOLN
INTRAVENOUS | Status: DC | PRN
  Administered 2011-09-15: 2 g via INTRAVENOUS

## 2011-09-15 MED ORDER — ROCURONIUM BROMIDE 100 MG/10ML IV SOLN
INTRAVENOUS | Status: DC | PRN
  Administered 2011-09-15: 40 mg via INTRAVENOUS

## 2011-09-15 MED ORDER — LIDOCAINE-EPINEPHRINE 1 %-1:100000 IJ SOLN
INTRAMUSCULAR | Status: DC | PRN
  Administered 2011-09-15: 20 mL

## 2011-09-15 MED ORDER — ATORVASTATIN CALCIUM 10 MG PO TABS
10.0000 mg | ORAL_TABLET | Freq: Every day | ORAL | Status: DC
  Administered 2011-09-15: 10 mg via ORAL
  Filled 2011-09-15 (×2): qty 1

## 2011-09-15 SURGICAL SUPPLY — 64 items
ADHESIVE PAD, ENT ×3 IMPLANT
BLADE RAD40 ROTATE 4M 4 5PK (BLADE) IMPLANT
BLADE RAD60 ROTATE M4 4 5PK (BLADE) IMPLANT
BLADE ROTATE TRICUT 4X13 M4 (BLADE) ×3 IMPLANT
BLADE TRICUT 4MM (BLADE) IMPLANT
BLADE TRICUT ROTATE M4 4 5PK (BLADE) IMPLANT
CANISTER SUCTION 2500CC (MISCELLANEOUS) ×3 IMPLANT
CATH SINUS BALLN 7X16 (CATHETERS) IMPLANT
CATH SINUS BALLN RELIEV 6X16 (SINUPLASTY) IMPLANT
CATH SINUS GUIDE F-70 (CATHETERS) IMPLANT
CATH SINUS GUIDE M/110 (CATHETERS) IMPLANT
CATH SINUS IRRIGATION 2.0 (CATHETERS) IMPLANT
CLOTH BEACON ORANGE TIMEOUT ST (SAFETY) ×3 IMPLANT
COAGULATOR SUCT SWTCH 10FR 6 (ELECTROSURGICAL) ×3 IMPLANT
COVER SURGICAL LIGHT HANDLE (MISCELLANEOUS) ×3 IMPLANT
DECANTER SPIKE VIAL GLASS SM (MISCELLANEOUS) ×3 IMPLANT
DEVICE INFLATION 20/61 (MISCELLANEOUS) IMPLANT
DRAPE CAMERA CLOSED 9X96 (DRAPES) IMPLANT
DRSG NASOPORE 8CM (GAUZE/BANDAGES/DRESSINGS) ×6 IMPLANT
ELECT COATED BLADE 2.86 ST (ELECTRODE) IMPLANT
ELECT REM PT RETURN 9FT ADLT (ELECTROSURGICAL) ×3
ELECTRODE REM PT RTRN 9FT ADLT (ELECTROSURGICAL) ×2 IMPLANT
FILTER ARTHROSCOPY CONVERTOR (FILTER) IMPLANT
FLOSEAL 10ML (HEMOSTASIS) IMPLANT
GAUZE SPONGE 2X2 8PLY STRL LF (GAUZE/BANDAGES/DRESSINGS) IMPLANT
GAUZE SPONGE 4X4 16PLY XRAY LF (GAUZE/BANDAGES/DRESSINGS) ×6 IMPLANT
GLOVE BIO SURGEON STRL SZ8 (GLOVE) ×3 IMPLANT
GLOVE BIOGEL PI IND STRL 8 (GLOVE) ×2 IMPLANT
GLOVE BIOGEL PI INDICATOR 8 (GLOVE) ×1
GLOVE ECLIPSE 6.5 STRL STRAW (GLOVE) ×3 IMPLANT
GLOVE ECLIPSE 7.5 STRL STRAW (GLOVE) ×3 IMPLANT
GOWN PREVENTION PLUS XLARGE (GOWN DISPOSABLE) ×3 IMPLANT
GOWN STRL NON-REIN LRG LVL3 (GOWN DISPOSABLE) ×6 IMPLANT
HANDLE SINUS GUIDE LP (INSTRUMENTS) IMPLANT
KIT BASIN OR (CUSTOM PROCEDURE TRAY) ×3 IMPLANT
KIT ROOM TURNOVER OR (KITS) ×3 IMPLANT
NEEDLE 27GAX1X1/2 (NEEDLE) ×3 IMPLANT
NEEDLE HYPO 25GX1X1/2 BEV (NEEDLE) IMPLANT
NEEDLE SPNL 25GX3.5 QUINCKE BL (NEEDLE) ×3 IMPLANT
NS IRRIG 1000ML POUR BTL (IV SOLUTION) ×3 IMPLANT
PACK EENT II TURBAN DRAPE (CUSTOM PROCEDURE TRAY) ×3 IMPLANT
PAD ARMBOARD 7.5X6 YLW CONV (MISCELLANEOUS) ×6 IMPLANT
PENCIL BUTTON HOLSTER BLD 10FT (ELECTRODE) IMPLANT
SOLUTION ANTI FOG 6CC (MISCELLANEOUS) ×3 IMPLANT
SPECIMEN JAR SMALL (MISCELLANEOUS) ×6 IMPLANT
SPLINT NASAL DOYLE BI-VL (GAUZE/BANDAGES/DRESSINGS) ×3 IMPLANT
SPONGE GAUZE 2X2 STER 10/PKG (GAUZE/BANDAGES/DRESSINGS)
SPONGE NEURO XRAY DETECT 1X3 (DISPOSABLE) ×3 IMPLANT
SUT CHROMIC 3 0 SH 27 (SUTURE) IMPLANT
SUT CHROMIC 4 0 S 4 (SUTURE) ×3 IMPLANT
SUT PLAIN 4 0 ~~LOC~~ 1 (SUTURE) ×3 IMPLANT
SUT PROLENE 2 0 FS (SUTURE) ×3 IMPLANT
SYR CONTROL 10ML LL (SYRINGE) IMPLANT
SYSTEM RELIEVA LUMA ILLUM (SINUPLASTY) IMPLANT
TOWEL OR 17X24 6PK STRL BLUE (TOWEL DISPOSABLE) ×3 IMPLANT
TOWEL OR 17X26 10 PK STRL BLUE (TOWEL DISPOSABLE) ×3 IMPLANT
TRACKER ENT INSTRUMENT (MISCELLANEOUS) ×3 IMPLANT
TRACKER ENT PATIENT (MISCELLANEOUS) ×3 IMPLANT
TRAY ENT MC OR (CUSTOM PROCEDURE TRAY) ×3 IMPLANT
TUBE SALEM SUMP 16 FR W/ARV (TUBING) ×3 IMPLANT
TUBING EXTENTION W/L.L. (IV SETS) ×3 IMPLANT
TUBING STRAIGHTSHOT EPS 5PK (TUBING) ×3 IMPLANT
WATER STERILE IRR 1000ML POUR (IV SOLUTION) ×3 IMPLANT
YANKAUER SUCT BULB TIP NO VENT (SUCTIONS) ×3 IMPLANT

## 2011-09-15 NOTE — Transfer of Care (Signed)
Immediate Anesthesia Transfer of Care Note  Patient: Jon White  Procedure(s) Performed: Procedure(s) (LRB): ENDOSCOPIC SINUS SURGERY WITH FUSION NAVIGATION (Bilateral) MAXILLARY ANTROSTOMY (Bilateral) SINUS EXPLORATION (Bilateral) SPHENOIDECTOMY (Bilateral) SEPTOPLASTY (Bilateral)  Patient Location: PACU  Anesthesia Type: General  Level of Consciousness: awake, alert  and oriented  Airway & Oxygen Therapy: Patient Spontanous Breathing and Patient connected to face mask oxygen  Post-op Assessment: Report given to PACU RN, Post -op Vital signs reviewed and stable and Patient moving all extremities X 4  Post vital signs: Reviewed and stable  Complications: No apparent anesthesia complications

## 2011-09-15 NOTE — Preoperative (Signed)
Beta Blockers   Reason not to administer Beta Blockers:Not Applicable 

## 2011-09-15 NOTE — H&P (Signed)
CC: Chronic nasal obstruction, pansinusitis, sinus and nasal polyps.  HPI: The patient presents today with his wife for a follow up evaluation. He was previously seen on 06/30/11. At that time, he was noted to have bilateral nasal polyposis with likely chronic rhinosinusitis. He was treated with nasal saline irrigation, Flonase nasal spray, and Prednisone dose pak for 6 days. He also underwent a paranasal sinus CT scan to evaluate the extent of the nasal polyposis and chronic sinusitis. The results show pan-opacification of all sinuses. The patient returns today reporting transient improvement in his nasal congestion while taking Prednisone. However, his chronic nasal obstruction recurred after completing the Prednisone. He has been treated with nasal saine irrigation, Flonase, Nasonex, and OTC antihistamines, but without significant improvement in his symptoms.  No facial pain, fever or purulent drainage noted. No other ENT, GI, or respiratory issue noted since the last visit.  No history of sinonasal surgery.  The patient's review of systems (constitutional, eyes, ENT, cardiovascular, respiratory, GI, musculoskeletal, skin, neurologic, psychiatric, endocrine, hematologic, allergic) is noted in the ROS questionnaire.  It is reviewed with the patient.    Past Medical History (Major events, hospitalizations, surgeries):  Lung surgery.     Known allergies: NKDA.     Ongoing medical problems: Cataracts, nasal congestion, chronic bronchitis, Prostate Cancer.     Family medical history: Heart disease.     Social history: The patient is married. He does drink alcohol ocassionally. He does smoke tobacco.  He denies the use of illegal drugs.  Exam: General: Communicates without difficulty, well nourished, no acute distress.  Head: Normocephalic, no evidence injury, no tenderness, facial buttresses intact without stepoff. Eyes: PERRL, EOMI. No scleral icterus, conjunctivae clear. Neuro: CN II exam reveals  vision grossly intact. No nystagmus at any point of gaze. Ears: Auricles well formed without lesions. Ear canals are intact without mass or lesion. No erythema or edema is appreciated. The TMs are intact without fluid. Nose: External evaluation reveals normal support and skin without lesions. Dorsum is intact. Anterior rhinoscopy reveals congested mucosa over anterior aspect of inferior turbinates and intact septum. No purulence noted. Oral:  Oral cavity and oropharynx are intact, symmetric, without erythema or edema.  Mucosa is moist without lesions. Neck: Full range of motion without pain. There is no significant lymphadenopathy. No masses palpable. Thyroid bed within normal limits to palpation. Parotid glands and submandibular glands equal bilaterally without mass. Trachea is midline. Neuro:  CN 2-12 grossly intact. Gait normal.   Procedure: Flexible Nasal Endoscopy: Risks, benefits, and alternatives of flexible endoscopy were explained to the patient. Specific mention was made of the risk of throat numbness with difficulty swallowing, possible bleeding from the nose and mouth, and pain from the procedure. The patient gave oral consent to proceed. The nasal cavities were decongested and anesthetised with a combination of oxymetazoline and 4% lidocaine solution. The flexible scope was inserted into the right nasal cavity. Endoscopy of the inferior and middle meatus was performed. The congested mucosa was as described above. Polyps were noted to be emanating from the middle meatus, nearly completely obstructing the nasal cavity. Olfactory cleft was obstructed by the polyps. Nasopharynx was clear. Turbinates were hypertrophied but without mass. Incomplete response to decongestion. The procedure was repeated on the contralateral side with similar findings. The patient tolerated the procedure well. Instructions were given to avoid eating or drinking for 2 hours.  Assessment: Persistent bilateral nasal/sinus  polyposis with severe nasoseptal deviation to the right and bilateral inferior turbinate hypertropy,  not responding to allergy treatment and systemic/topical steroids.  Follow up: In light of the patient's persistent symptoms, he will likely benefit from undergoing bilateral functional endoscopic sinus surgery with septoplasty and bilateral partial inferior turbinate resection. The risks, benefits, alternatives, and details of the procedure are also reviewed with the patient. Questions are invited and answered. The patient is interested in proceeding with the procedure.   Rosangela Fehrenbach Philomena Doheny, MD

## 2011-09-15 NOTE — Brief Op Note (Signed)
09/15/2011  12:12 PM  PATIENT:  [name]  78 y.o. male  PRE-OPERATIVE DIAGNOSIS:  Septal Deviation, Maxillary Sinusitis, Ethmoid Sinusitis, Frontal Sinusitis, Sphenoid Sinusitis, nasal and sinus polyposis  POST-OPERATIVE DIAGNOSIS:  Septal Deviation, Maxillary Sinusitis, Ethmoid Sinusitis, Frontal Sinusitis, Sphenoid Sinusitis, nasal and sinus polyposis  PROCEDURE:  Procedure(s) (LRB): 1) Bilateral endoscopic frontal sinusotomy with polyp removal 2) SEPTOPLASTY 3) Bilateral endoscopic maxillary antrostomy with polyp removal 4) Bilateral endoscopic total ethmoidectomy 5) Bilateral endoscopic sphenoidotomy with polyp removal 6) Fusion image-guided navigation system  SURGEON:  Surgeon(s) and Role:    * Darletta Moll, MD - Primary  PHYSICIAN ASSISTANT:   ASSISTANTS: none   ANESTHESIA:   general  EBL:  Total I/O In: 1100 [I.V.:1100] Out: 250 [Blood:250]  BLOOD ADMINISTERED:none  DRAINS: none   LOCAL MEDICATIONS USED:  LIDOCAINE   SPECIMEN:  Source of Specimen:  Bilateral nasal and sinus polyps  DISPOSITION OF SPECIMEN:  PATHOLOGY  COUNTS:  YES  TOURNIQUET:  * No tourniquets in log *  DICTATION: .Other Dictation: Dictation Number   PLAN OF CARE: Admit for overnight observation  PATIENT DISPOSITION:  PACU - hemodynamically stable.   Delay start of Pharmacological VTE agent (>24hrs) due to surgical blood loss or risk of bleeding: not applicable

## 2011-09-15 NOTE — Anesthesia Postprocedure Evaluation (Signed)
  Anesthesia Post-op Note  Patient: CANNEN DUPRAS  Procedure(s) Performed: Procedure(s) (LRB): ENDOSCOPIC SINUS SURGERY WITH FUSION NAVIGATION (Bilateral) MAXILLARY ANTROSTOMY (Bilateral) SINUS EXPLORATION (Bilateral) SPHENOIDECTOMY (Bilateral) SEPTOPLASTY (Bilateral)  Patient Location: PACU  Anesthesia Type: General  Level of Consciousness: awake  Airway and Oxygen Therapy: Patient Spontanous Breathing  Post-op Pain: mild  Post-op Assessment: Post-op Vital signs reviewed  Post-op Vital Signs: stable  Complications: No apparent anesthesia complications

## 2011-09-15 NOTE — Anesthesia Preprocedure Evaluation (Addendum)
Anesthesia Evaluation  Patient identified by MRN, date of birth, ID band Patient awake    Airway Mallampati: I TM Distance: >3 FB Neck ROM: Limited    Dental  (+) Edentulous Upper, Upper Dentures, Partial Lower, Poor Dentition and Missing   Pulmonary shortness of breath, asthma , sleep apnea and Continuous Positive Airway Pressure Ventilation , COPD COPD inhaler,    Pulmonary exam normal       Cardiovascular     Neuro/Psych  Neuromuscular disease    GI/Hepatic GERD-  ,  Endo/Other    Renal/GU      Musculoskeletal   Abdominal   Peds  Hematology   Anesthesia Other Findings   Reproductive/Obstetrics                          Anesthesia Physical Anesthesia Plan  ASA: III  Anesthesia Plan: General   Post-op Pain Management:    Induction: Intravenous  Airway Management Planned: Oral ETT  Additional Equipment:   Intra-op Plan:   Post-operative Plan: Extubation in OR  Informed Consent: I have reviewed the patients History and Physical, chart, labs and discussed the procedure including the risks, benefits and alternatives for the proposed anesthesia with the patient or authorized representative who has indicated his/her understanding and acceptance.     Plan Discussed with: CRNA and Surgeon  Anesthesia Plan Comments:        Anesthesia Quick Evaluation

## 2011-09-15 NOTE — Op Note (Signed)
Jon White, Jon White              ACCOUNT NO.:  [id number]  MEDICAL RECORD NO.:  [id number]  LOCATION:  5123                         FACILITY:  [facility]  PHYSICIAN:  Newman Pies, MD            DATE OF BIRTH:  [birth year]  DATE OF PROCEDURE:  09/15/2011 DATE OF DISCHARGE:                              OPERATIVE REPORT   SURGEON:  Newman Pies, MD  PREOPERATIVE DIAGNOSES: 1. Bilateral chronic pansinusitis. 2. Bilateral sinonasal polyposis. 3. Chronic nasal obstruction. 4. Severe nasal septal deviation.  POSTOPERATIVE DIAGNOSES: 1. Bilateral chronic pansinusitis. 2. Bilateral sinonasal polyposis. 2. Chronic nasal obstruction. 3. Severe nasal septal deviation.  PROCEDURE PERFORMED: 1. Bilateral endoscopic frontal sinusotomy with polyp removal. 2. Septoplasty. 3. Bilateral endoscopic total ethmoidectomy. 4. Bilateral endoscopic maxillary antrostomy with polyp removal. 5. Bilateral endoscopic sphenoidotomy with polyp removal. 6. Fusion image-guided navigation system.  ANESTHESIA:  General endotracheal tube anesthesia.  COMPLICATIONS:  None.  ESTIMATED BLOOD LOSS:  250 mL.  INDICATION FOR PROCEDURE:  The patient is a 78 year old male with a history of bilateral chronic sinusitis and bilateral chronic nasal obstructions.  On examination, he was noted to have a large nasal polyps bilaterally, nearly completely obstructed his nasal cavities bilaterally.  The patient was previously treated with antihistamine, topical and systemic steroids, multiple antibiotics, decongestant, as well as allergy evaluation and treatment by Dr. [place] Callas.  On CT scan, he continues to have bilateral pansinusitis with near-complete opacifications of all his sinuses.  Based on the above findings, the decision was made for the patient to undergo the above-stated procedures.  The risks, benefits, alternatives, and details of the procedure were discussed with the patient.  Questions were invited and answered.  Informed  consent was obtained.  DESCRIPTION OF PROCEDURE:  The patient was taken to the operating room and placed supine on the operating table.  General endotracheal tube anesthesia was administered by the anesthesiologist.  Preop IV antibiotics was given.  The patient was positioned and prepped and draped in a standard fashion for sinus surgery.  The Fusion navigation landmark was placed on the forehead.  The registration was successful. The Fusion image guidance system was functional throughout the case. Pledgets soaked with Afrin were placed in both nasal cavities for vasoconstriction.  The pledgets were subsequently removed.  Endoscopic evaluation of both nasal cavities revealed large nasal polyps bilaterally.  The septum was severely deviated to the right.  1% lidocaine with 1:100000 epinephrine was injected onto the nasal septum bilaterally.  A standard hemitransfixion incision was made on the left side.  The mucosal flap was elevated without difficulty.  A cartilaginous incision was made 1 cm superior to the caudal margin of the septum.  The mucosal flap was also elevated on the contralateral side.  The deviated portion of the cartilage and bony septum were removed.  The cartilage was morselized and replaced.  The septum was quilted with 4-0 plain gut sutures.  The hemitransfixion incision was closed with interrupted 4-0 chromic sutures.  Attention was then focused on the left nasal cavity.  Under the guidance of the rigid endoscope, large polyps were removed from the nasal cavity and the middle meatus.  The  polyps were removed with a combination of Blakesley forceps, Tru-Cut forceps, and microdebrider.  All polyps were sent to the Pathology Department for permanent histologic identifications.  The maxillary opening was then enlarged with a combination of backbiters and Tru-Cut forceps.  Large number of polyps were also removed from the maxillary sinus.  Both anterior and  posterior ethmoid cavities were then opened widely.  Polypoid tissue was also removed from both anterior and posterior ethmoid cavities.  It should be noted that the polyps were also completely filled up superior nasal cavity.  They were carefully removed.  The frontal recess was then enlarged.  The left frontal sinus was also filled with polypoid tissue. All polyps were removed with an angle-rigid endoscope and frontal sinus forceps.  Attention was then focused on the sphenoid sinus.  The sphenoid antrum was enlarged.  Polyp was removed from the sphenoid cavity as well.  The surgical sites were copiously irrigated.  Nasopore packing was placed lateral to the middle turbinate.  The same procedure was then repeated on the right side without exception.  Similar findings were also noted on the right side.  It should be noted that the right middle turbinate was missing, likely secondary to previous surgery.  Polyps were removed from the right nasal cavity, maxillary sinus, anterior and posterior ethmoids, frontal sinus, and sphenoid sinus.  All polyps were sent to the Pathology Department for permanent histologic identification.  Doyle splints were applied to both sides of the nasal septum.  They were secured in place with 2-0 Prolene sutures.  That concluded procedure for the patient.  The care of the patient was turned over to the anesthesiologist.  The patient was awakened from anesthesia without difficulty.  He was extubated and transferred to the recovery room in good condition.  OPERATIVE FINDINGS: 1. Severe nasal septal deviation to the right. 2. Bilateral pansinusitis with polyps filling both nasal cavities,     maxillary, ethmoid, frontal, and sphenoid sinuses.  SPECIMENS:  Bilateral nasal and sinus polyps.  FOLLOWUP CARE:  The patient will be observed overnight in the hospital secondary to his history of obstructive sleep apnea.  He will most likely be discharged home on  postop day #1.  He will be placed on Vicodin 1-2 tablets p.o. q.4-6 hours p.r.n. pain and amoxicillin 875 mg p.o. b.i.d. for 5 days.  The patient will follow up in my office in 5 days for splint removal.     Newman Pies, MD     ST/MEDQ  D:  09/15/2011  T:  09/15/2011  Job:  [id number]  cc:   Sidney Ace, M.D. [facility]

## 2011-09-16 ENCOUNTER — Telehealth: Payer: Self-pay | Admitting: Pulmonary Disease

## 2011-09-16 DIAGNOSIS — J328 Other chronic sinusitis: Secondary | ICD-10-CM | POA: Diagnosis not present

## 2011-09-16 DIAGNOSIS — K219 Gastro-esophageal reflux disease without esophagitis: Secondary | ICD-10-CM | POA: Diagnosis not present

## 2011-09-16 DIAGNOSIS — J339 Nasal polyp, unspecified: Secondary | ICD-10-CM | POA: Diagnosis not present

## 2011-09-16 DIAGNOSIS — J342 Deviated nasal septum: Secondary | ICD-10-CM | POA: Diagnosis not present

## 2011-09-16 DIAGNOSIS — Z01812 Encounter for preprocedural laboratory examination: Secondary | ICD-10-CM | POA: Diagnosis not present

## 2011-09-16 DIAGNOSIS — J449 Chronic obstructive pulmonary disease, unspecified: Secondary | ICD-10-CM | POA: Diagnosis not present

## 2011-09-16 NOTE — Discharge Summary (Signed)
Physician Discharge Summary  Patient ID: Jon White MRN: [id number] DOB/AGE: 1932/12/20 78 y.o.  Admit date: 09/15/2011 Discharge date: 09/16/2011  Admission Diagnoses: Chronic pansinusitis and polyposis, NSD, OSA  Discharge Diagnoses: Chronic pansinusitis and polyposis, NSD, OSA Active Problems:  * No active hospital problems. *    Discharged Condition: good  Hospital Course: Pt had an uneventful recovery overnight. D/C home on POD #1.  Consults: None  Significant Diagnostic Studies: none  Treatments: surgery: septoplasty and bilateral FESS  Discharge Exam: Blood pressure 118/70, pulse 63, temperature 97.5 F (36.4 C), temperature source Oral, resp. rate 17, height 5\' 8"  (1.727 m), weight 71.25 kg (157 lb 1.2 oz), SpO2 100.00%. Vision intact bilaterally No active bleeding from nose or mouth. Septal splints in place. No stridor  Disposition: 01-Home or Self Care  Discharge Orders    Future Orders Please Complete By Expires   Diet general      Increase activity slowly        Medication List  As of 09/16/2011  8:05 AM   STOP taking these medications         azelastine 137 MCG/SPRAY nasal spray               Resume these medications         atorvastatin 10 MG tablet   Commonly known as: LIPITOR   Take 10 mg by mouth daily. Last taken 09/01/2011 to see if he went off med. If the rash- legs & arms would resolve.  Since stopping med., rash has cleared      budesonide-formoterol 160-4.5 MCG/ACT inhaler   Commonly known as: SYMBICORT   Inhale 2 puffs into the lungs 2 (two) times daily.      famotidine 20 MG tablet   Commonly known as: PEPCID   Take 1 tablet (20 mg total) by mouth 2 (two) times daily.      hydrOXYzine 25 MG tablet   Commonly known as: ATARAX/VISTARIL   Take 25 mg by mouth 3 (three) times daily.      VENTOLIN HFA 108 (90 BASE) MCG/ACT inhaler   Generic drug: albuterol   Inhale 2 puffs into the lungs every 6 (six) hours as needed. For  shortness of breath            predniSONE 10 MG tablet         Signed: Miyako Oelke,SUI W 09/16/2011, 8:05 AM

## 2011-09-16 NOTE — Telephone Encounter (Signed)
I spoke with Jon White and she is going to look into this since order was sent 08/26/11. Pt aware we are working on this

## 2011-09-16 NOTE — Telephone Encounter (Signed)
Spoke with Fayrene Fearing @ [facility], who says pt has been evaluted for new cpap and ins is ok for new cpap, they have sent a smn for signature by MD. Burgess Amor is in Dr Shelle Iron to sign folder.  Pt does have a working cpap and is not going without, per [facility] this  has been explained that will be setup with new cpap when order is signed.  I called patient and informed pt of the same, he understands.  Will pull out smn to see if Dr Shelle Iron can sign so I can fax. Kandice Hams

## 2011-09-16 NOTE — Discharge Instructions (Signed)
POSTOPERATIVE INSTRUCTIONS FOR PATIENTS HAVING NASAL OR SINUS OPERATIONS ACTIVITY: Restrict activity at home for the first two days, resting as much as possible. Light activity is best. You may usually return to work within a week. You should refrain from nose blowing, strenuous activity, or heavy lifting greater than 20lbs for a total of three weeks after your operation.  If sneezing cannot be avoided, sneeze with your mouth open. DISCOMFORT: You may experience a dull headache and pressure along with nasal congestion and discharge. These symptoms may be worse during the first week after the operation but may last as long as two to four weeks.  Please take Tylenol or the pain medication that has been prescribed for you. Do not take aspirin or aspirin containing medications since they may cause bleeding.  You may experience symptoms of post nasal drainage, nasal congestion, headaches and fatigue for two or three months after your operation.  BLEEDING: You may have some blood tinged nasal drainage for approximately two weeks after the operation.  The discharge will be worse for the first week.  Please call our office at [phone] or go to the nearest hospital emergency room if you experience any of the following: heavy, bright red blood from your nose or mouth that lasts longer than ten minutes or coughing up or vomiting bright red blood or blood clots. GENERAL CONSIDERATIONS: 1. A gauze dressing will be placed on your upper lip to absorb any drainage after the operation. You may need to change this several times a day.  If you do not have very much drainage, you may remove the dressing.  Remember that you may gently wipe your nose with a tissue and sniff in, but DO NOT blow your nose. 2. Please keep all of your postoperative appointments.  Your final results after the operation will depend on proper follow-up.  The initial visit is usually four to seven days after the operation.  During this visit, the  remaining nasal packing and internal septal splints will be removed.  Your nasal and sinus cavities will be cleaned.  During the second visit, your nasal and sinus cavities will be cleaned again. Have someone drive you to your first two postoperative appointments. We suggest that you take your prescribed pain medication about  hour prior to each of these two appointments.  3. How you care for your nose after the operation will influence the results that you obtain.  You should follow all directions, take your medication as prescribed, and call our office [phone] with any problems or questions. 4. You may be more comfortable sleeping with your head elevated on two pillows. 5. Do not take any medications that we have not prescribed or recommended. WARNING SIGNS: if any of the following should occur, please call our office: 1. Bright red bleeding which lasts more than 10 minutes. 2. Persistent fever greater than 102F. 3. Persistent vomiting. 4. Severe and constant pain that is not relieved by prescribed pain medication. 5. Trauma to the nose. 6. Rash or unusual side effects from any medicines.  YOUR RETURN APPOINTMENT: 09/20/11

## 2011-09-16 NOTE — Progress Notes (Signed)
Discharge instructions/Med Rec Sheet reviewed w/ pt. Pt expressed understanding and copies given w/ prescriptions. Pt d/c'd in stable condition via w/c, accompanied by NT  

## 2011-09-17 ENCOUNTER — Encounter (HOSPITAL_COMMUNITY): Payer: Self-pay | Admitting: Otolaryngology

## 2011-09-20 DIAGNOSIS — E785 Hyperlipidemia, unspecified: Secondary | ICD-10-CM | POA: Diagnosis not present

## 2011-09-20 DIAGNOSIS — J323 Chronic sphenoidal sinusitis: Secondary | ICD-10-CM | POA: Diagnosis not present

## 2011-09-20 DIAGNOSIS — J320 Chronic maxillary sinusitis: Secondary | ICD-10-CM | POA: Diagnosis not present

## 2011-09-20 DIAGNOSIS — J322 Chronic ethmoidal sinusitis: Secondary | ICD-10-CM | POA: Diagnosis not present

## 2011-09-20 DIAGNOSIS — M549 Dorsalgia, unspecified: Secondary | ICD-10-CM | POA: Diagnosis not present

## 2011-09-20 DIAGNOSIS — M1990 Unspecified osteoarthritis, unspecified site: Secondary | ICD-10-CM | POA: Diagnosis not present

## 2011-09-20 DIAGNOSIS — R109 Unspecified abdominal pain: Secondary | ICD-10-CM | POA: Diagnosis not present

## 2011-09-20 DIAGNOSIS — J321 Chronic frontal sinusitis: Secondary | ICD-10-CM | POA: Diagnosis not present

## 2011-10-01 DIAGNOSIS — J323 Chronic sphenoidal sinusitis: Secondary | ICD-10-CM | POA: Diagnosis not present

## 2011-10-01 DIAGNOSIS — J321 Chronic frontal sinusitis: Secondary | ICD-10-CM | POA: Diagnosis not present

## 2011-10-01 DIAGNOSIS — J338 Other polyp of sinus: Secondary | ICD-10-CM | POA: Diagnosis not present

## 2011-10-01 DIAGNOSIS — J322 Chronic ethmoidal sinusitis: Secondary | ICD-10-CM | POA: Diagnosis not present

## 2011-10-12 DIAGNOSIS — R5383 Other fatigue: Secondary | ICD-10-CM | POA: Diagnosis not present

## 2011-10-12 DIAGNOSIS — R5381 Other malaise: Secondary | ICD-10-CM | POA: Diagnosis not present

## 2011-10-15 DIAGNOSIS — J3089 Other allergic rhinitis: Secondary | ICD-10-CM | POA: Diagnosis not present

## 2011-10-15 DIAGNOSIS — L259 Unspecified contact dermatitis, unspecified cause: Secondary | ICD-10-CM | POA: Diagnosis not present

## 2011-10-15 DIAGNOSIS — J45909 Unspecified asthma, uncomplicated: Secondary | ICD-10-CM | POA: Diagnosis not present

## 2011-10-15 DIAGNOSIS — R21 Rash and other nonspecific skin eruption: Secondary | ICD-10-CM | POA: Diagnosis not present

## 2011-10-19 DIAGNOSIS — L501 Idiopathic urticaria: Secondary | ICD-10-CM | POA: Diagnosis not present

## 2011-10-19 DIAGNOSIS — E785 Hyperlipidemia, unspecified: Secondary | ICD-10-CM | POA: Diagnosis not present

## 2011-10-19 DIAGNOSIS — J329 Chronic sinusitis, unspecified: Secondary | ICD-10-CM | POA: Diagnosis not present

## 2011-10-19 DIAGNOSIS — G4733 Obstructive sleep apnea (adult) (pediatric): Secondary | ICD-10-CM | POA: Diagnosis not present

## 2011-10-20 DIAGNOSIS — H6120 Impacted cerumen, unspecified ear: Secondary | ICD-10-CM | POA: Diagnosis not present

## 2011-10-20 DIAGNOSIS — J338 Other polyp of sinus: Secondary | ICD-10-CM | POA: Diagnosis not present

## 2011-10-20 DIAGNOSIS — J320 Chronic maxillary sinusitis: Secondary | ICD-10-CM | POA: Diagnosis not present

## 2011-10-20 DIAGNOSIS — J322 Chronic ethmoidal sinusitis: Secondary | ICD-10-CM | POA: Diagnosis not present

## 2011-10-30 DIAGNOSIS — I2699 Other pulmonary embolism without acute cor pulmonale: Secondary | ICD-10-CM

## 2011-10-30 HISTORY — DX: Other pulmonary embolism without acute cor pulmonale: I26.99

## 2011-11-18 DIAGNOSIS — Z87891 Personal history of nicotine dependence: Secondary | ICD-10-CM | POA: Diagnosis not present

## 2011-11-18 DIAGNOSIS — G4733 Obstructive sleep apnea (adult) (pediatric): Secondary | ICD-10-CM | POA: Diagnosis not present

## 2011-11-18 DIAGNOSIS — J4489 Other specified chronic obstructive pulmonary disease: Secondary | ICD-10-CM | POA: Diagnosis not present

## 2011-11-18 DIAGNOSIS — J90 Pleural effusion, not elsewhere classified: Secondary | ICD-10-CM | POA: Diagnosis not present

## 2011-11-18 DIAGNOSIS — I2699 Other pulmonary embolism without acute cor pulmonale: Secondary | ICD-10-CM | POA: Diagnosis not present

## 2011-11-18 DIAGNOSIS — J984 Other disorders of lung: Secondary | ICD-10-CM | POA: Diagnosis not present

## 2011-11-18 DIAGNOSIS — J441 Chronic obstructive pulmonary disease with (acute) exacerbation: Secondary | ICD-10-CM | POA: Diagnosis not present

## 2011-11-18 DIAGNOSIS — L501 Idiopathic urticaria: Secondary | ICD-10-CM | POA: Diagnosis present

## 2011-11-18 DIAGNOSIS — R0602 Shortness of breath: Secondary | ICD-10-CM | POA: Diagnosis not present

## 2011-11-18 DIAGNOSIS — M79609 Pain in unspecified limb: Secondary | ICD-10-CM | POA: Diagnosis not present

## 2011-11-18 DIAGNOSIS — I2789 Other specified pulmonary heart diseases: Secondary | ICD-10-CM | POA: Diagnosis present

## 2011-11-18 DIAGNOSIS — I824Y9 Acute embolism and thrombosis of unspecified deep veins of unspecified proximal lower extremity: Secondary | ICD-10-CM | POA: Diagnosis not present

## 2011-11-18 DIAGNOSIS — I82409 Acute embolism and thrombosis of unspecified deep veins of unspecified lower extremity: Secondary | ICD-10-CM | POA: Diagnosis not present

## 2011-11-18 DIAGNOSIS — R918 Other nonspecific abnormal finding of lung field: Secondary | ICD-10-CM | POA: Diagnosis not present

## 2011-11-18 DIAGNOSIS — R062 Wheezing: Secondary | ICD-10-CM | POA: Diagnosis not present

## 2011-11-18 DIAGNOSIS — J449 Chronic obstructive pulmonary disease, unspecified: Secondary | ICD-10-CM | POA: Diagnosis not present

## 2011-11-26 DIAGNOSIS — I2699 Other pulmonary embolism without acute cor pulmonale: Secondary | ICD-10-CM | POA: Diagnosis not present

## 2011-11-26 DIAGNOSIS — Z7901 Long term (current) use of anticoagulants: Secondary | ICD-10-CM | POA: Diagnosis not present

## 2011-12-03 DIAGNOSIS — Z7901 Long term (current) use of anticoagulants: Secondary | ICD-10-CM | POA: Diagnosis not present

## 2011-12-17 DIAGNOSIS — Z7901 Long term (current) use of anticoagulants: Secondary | ICD-10-CM | POA: Diagnosis not present

## 2011-12-22 DIAGNOSIS — I2699 Other pulmonary embolism without acute cor pulmonale: Secondary | ICD-10-CM | POA: Diagnosis not present

## 2011-12-31 DIAGNOSIS — Z7901 Long term (current) use of anticoagulants: Secondary | ICD-10-CM | POA: Diagnosis not present

## 2012-01-14 DIAGNOSIS — Z7901 Long term (current) use of anticoagulants: Secondary | ICD-10-CM | POA: Diagnosis not present

## 2012-01-18 ENCOUNTER — Telehealth: Payer: Self-pay | Admitting: Pulmonary Disease

## 2012-01-18 NOTE — Telephone Encounter (Signed)
Forward 3 pages from [facility] to Dr. Marcelyn Bruins for review on 01-18-12 ym

## 2012-01-25 DIAGNOSIS — Z961 Presence of intraocular lens: Secondary | ICD-10-CM | POA: Diagnosis not present

## 2012-01-25 DIAGNOSIS — H04129 Dry eye syndrome of unspecified lacrimal gland: Secondary | ICD-10-CM | POA: Diagnosis not present

## 2012-01-28 DIAGNOSIS — Z7901 Long term (current) use of anticoagulants: Secondary | ICD-10-CM | POA: Diagnosis not present

## 2012-02-01 DIAGNOSIS — L299 Pruritus, unspecified: Secondary | ICD-10-CM | POA: Diagnosis not present

## 2012-02-01 DIAGNOSIS — L500 Allergic urticaria: Secondary | ICD-10-CM | POA: Diagnosis not present

## 2012-02-01 DIAGNOSIS — Z8679 Personal history of other diseases of the circulatory system: Secondary | ICD-10-CM | POA: Diagnosis not present

## 2012-02-01 DIAGNOSIS — Z7901 Long term (current) use of anticoagulants: Secondary | ICD-10-CM | POA: Diagnosis not present

## 2012-02-01 DIAGNOSIS — L503 Dermatographic urticaria: Secondary | ICD-10-CM | POA: Diagnosis not present

## 2012-02-18 DIAGNOSIS — Z7901 Long term (current) use of anticoagulants: Secondary | ICD-10-CM | POA: Diagnosis not present

## 2012-02-24 DIAGNOSIS — L509 Urticaria, unspecified: Secondary | ICD-10-CM | POA: Diagnosis not present

## 2012-02-24 DIAGNOSIS — I2510 Atherosclerotic heart disease of native coronary artery without angina pectoris: Secondary | ICD-10-CM | POA: Diagnosis not present

## 2012-02-24 DIAGNOSIS — I10 Essential (primary) hypertension: Secondary | ICD-10-CM | POA: Diagnosis not present

## 2012-02-24 DIAGNOSIS — L503 Dermatographic urticaria: Secondary | ICD-10-CM | POA: Diagnosis not present

## 2012-02-24 DIAGNOSIS — Z23 Encounter for immunization: Secondary | ICD-10-CM | POA: Diagnosis not present

## 2012-02-29 DIAGNOSIS — Z7901 Long term (current) use of anticoagulants: Secondary | ICD-10-CM | POA: Diagnosis not present

## 2012-02-29 DIAGNOSIS — I2699 Other pulmonary embolism without acute cor pulmonale: Secondary | ICD-10-CM | POA: Diagnosis not present

## 2012-03-07 DIAGNOSIS — I2699 Other pulmonary embolism without acute cor pulmonale: Secondary | ICD-10-CM | POA: Diagnosis not present

## 2012-03-07 DIAGNOSIS — E785 Hyperlipidemia, unspecified: Secondary | ICD-10-CM | POA: Diagnosis not present

## 2012-03-07 DIAGNOSIS — I10 Essential (primary) hypertension: Secondary | ICD-10-CM | POA: Diagnosis not present

## 2012-04-14 DIAGNOSIS — E785 Hyperlipidemia, unspecified: Secondary | ICD-10-CM | POA: Diagnosis not present

## 2012-04-14 DIAGNOSIS — Z1331 Encounter for screening for depression: Secondary | ICD-10-CM | POA: Diagnosis not present

## 2012-04-14 DIAGNOSIS — G4733 Obstructive sleep apnea (adult) (pediatric): Secondary | ICD-10-CM | POA: Diagnosis not present

## 2012-04-14 DIAGNOSIS — Z0000 Encounter for general adult medical examination without abnormal findings: Secondary | ICD-10-CM | POA: Diagnosis not present

## 2012-04-14 DIAGNOSIS — M810 Age-related osteoporosis without current pathological fracture: Secondary | ICD-10-CM | POA: Diagnosis not present

## 2012-04-14 DIAGNOSIS — M1990 Unspecified osteoarthritis, unspecified site: Secondary | ICD-10-CM | POA: Diagnosis not present

## 2012-04-14 DIAGNOSIS — M549 Dorsalgia, unspecified: Secondary | ICD-10-CM | POA: Diagnosis not present

## 2012-04-17 DIAGNOSIS — C61 Malignant neoplasm of prostate: Secondary | ICD-10-CM | POA: Diagnosis not present

## 2012-04-24 DIAGNOSIS — M1990 Unspecified osteoarthritis, unspecified site: Secondary | ICD-10-CM | POA: Diagnosis not present

## 2012-04-24 DIAGNOSIS — E782 Mixed hyperlipidemia: Secondary | ICD-10-CM | POA: Diagnosis not present

## 2012-04-24 DIAGNOSIS — Z0000 Encounter for general adult medical examination without abnormal findings: Secondary | ICD-10-CM | POA: Diagnosis not present

## 2012-04-24 DIAGNOSIS — H908 Mixed conductive and sensorineural hearing loss, unspecified: Secondary | ICD-10-CM | POA: Diagnosis not present

## 2012-04-24 DIAGNOSIS — G4733 Obstructive sleep apnea (adult) (pediatric): Secondary | ICD-10-CM | POA: Diagnosis not present

## 2012-04-24 DIAGNOSIS — I2699 Other pulmonary embolism without acute cor pulmonale: Secondary | ICD-10-CM | POA: Diagnosis not present

## 2012-05-17 DIAGNOSIS — L259 Unspecified contact dermatitis, unspecified cause: Secondary | ICD-10-CM | POA: Diagnosis not present

## 2012-05-17 DIAGNOSIS — J45909 Unspecified asthma, uncomplicated: Secondary | ICD-10-CM | POA: Diagnosis not present

## 2012-05-17 DIAGNOSIS — R21 Rash and other nonspecific skin eruption: Secondary | ICD-10-CM | POA: Diagnosis not present

## 2012-05-17 DIAGNOSIS — J3089 Other allergic rhinitis: Secondary | ICD-10-CM | POA: Diagnosis not present

## 2012-05-17 NOTE — Progress Notes (Signed)
Subjective:    Patient ID: Jon White, male    DOB: 1933/05/10, 78 y.o.   MRN: [id number]  HPI Visit Type:  Follow-up Copy to:  Dr. Jolene Provost Primary Provider/Referring Provider:  Dr Georgianne Fick PMD, DR [facility] ENT, Dr. Arlyce Dice GI, Dr [place] Callas - Allergy   OV April 2011:  Fu copd/asthma: chronic cough and cough variant asthma.,  Last visits March and November 2010. Since then has seen Dr. Lazarus Salines who is contemplating sinus surgery if medical Rx fails. He has continued nasal steroid ciclesonide, daily saline wash via nettpot. However, he did see Dr. [place] Callas for allergy eval. Reportedly skin testing mildly positive for mold but otherwise negative. Was started on singulair. Afer that sinus drainage is "90% better". Able to cope with current levels of sinus drainage. Using tissue only 4-5 times per day as oppsed to >10-20 times per day. Re: GERD. He is not on PPI. Still having some cough and tickle in throat after he eats. Wants to get rid of it. Does not think this element of cough is related to sinus drainage. No other complaints. No dyspnea. Contnues with symbicort. Due to go back to [facility] for 5 months starting October 29, 2009  Problem # 1:  COUGH (ICD-786.2)   Etiologys a) elated to sinus drainage and mucus; b) Silent GERD  (he has hiatal hernia & esophagitis on ct chest 02/2008 + hx of hiccups + hx of intermittent dysphagia -.sp eso dilataition 07/2008; and c)  Cough variant asthma coexisting based on recurrence of chest tightness after dc of symbicort 07/2008  > On 09/10/2009: improved control after Dr. [place] Callas added singulair to regimen in Mid -feb 2011. Stil lhaving some cough that is likely due to GERD versus Habit cough. He is not on PPI and wonder why. He is not concerned about aspirin as cause and is continuing it  plan Sinus per Dr Lazarus Salines nad Dr. [place] Callas GI per Dr Arlyce Dice but will add PPI nexium Cough Variant asthma - contnue symbicort He will call in 1 month. If element of  cough that is thought as due to GERD is betteer then we will continue PPI. Otherwise, will consider neurontin for habit cough Will get records from DR sharma     Patient Instructions: 1)  continue your medicines 2)  add nexium to your current regimen 3)  take it on empty stomach 4)  follow GERD diet advice 5)  Call me in 1 month to see if cough even better 6)  Depending on your call I will advise you next step   OV 05/18/2012  Not seen in > 2 years. He has been referred back for new problem of PE. IN November 18, 2011 one week after long car trip to [place] he was helping someone out with some heavy lifting at camp site and then became acutely unwell. Rushed to [facility] ER. Where per hx andreview of recoords: He was diagnosed with DVT left popliteal veint and RLL Sup Segment PE on CT June 201, 2013. Other tests showed: trop < 0.04, ddimer 5.6,  ECHO 11/19/11 - Normal LVEF. Mild TR, Mild PHA. Mild LVH. Rx as PE for 3-4 months with coumadin. Currently dyspneic ever since PE - simple activitis make him dyspneic. Gained 8#. Also 6 days in hospital. Walking desaturation test on 05/18/2012 185 feet x 3 laps:  did NOT desaturate. Rest pulse ox was 96%, final pulse ox was 91%. HR response 71/min at rest to 89/min at peak exertion.  Of note, CT June 2013 was significant for other findings that include: a) emphysema; b)   abnormal esophagus, hiatal hernia - rec endoscopy, and c) RML and R Anterior Base infiltrates - rad recommending followup    Review of Systems  Constitutional: Negative for fever and unexpected weight change.  HENT: Negative for ear pain, nosebleeds, congestion, sore throat, rhinorrhea, sneezing, trouble swallowing, dental problem, postnasal drip and sinus pressure.   Eyes: Negative for redness and itching.  Respiratory: Positive for shortness of breath. Negative for cough, chest tightness and wheezing.   Cardiovascular: Negative for palpitations and leg swelling.   Gastrointestinal: Negative for nausea and vomiting.  Genitourinary: Negative for dysuria.  Musculoskeletal: Negative for joint swelling.  Skin: Negative for rash.  Neurological: Negative for headaches.  Hematological: Does not bruise/bleed easily.  Psychiatric/Behavioral: Negative for dysphoric mood. The patient is not nervous/anxious.       Current outpatient prescriptions:albuterol (VENTOLIN HFA) 108 (90 BASE) MCG/ACT inhaler, Inhale 2 puffs into the lungs every 6 (six) hours as needed. For shortness of breath, Disp: , Rfl: ;  atorvastatin (LIPITOR) 10 MG tablet, Take 10 mg by mouth daily. Last taken 09/01/2011 to see if he went off med. If the rash- legs & arms would resolve.  Since stopping med., rash has cleared, Disp: , Rfl:  budesonide-formoterol (SYMBICORT) 160-4.5 MCG/ACT inhaler, Inhale 2 puffs into the lungs 2 (two) times daily. , Disp: , Rfl: ;  cetirizine (ZYRTEC) 10 MG tablet, Take 10 mg by mouth daily., Disp: , Rfl: ;  diphenhydrAMINE (SOMINEX) 25 MG tablet, Take 25 mg by mouth at bedtime as needed., Disp: , Rfl: ;  famotidine (PEPCID) 20 MG tablet, Take 1 tablet (20 mg total) by mouth 2 (two) times daily., Disp: 30 tablet, Rfl: 0  Objective:   Physical Exam Physical Exam Filed Vitals:   05/18/12 1522  BP: 140/80  Pulse: 83  Temp: 98.2 F (36.8 C)  TempSrc: Oral  Height: 5\' 8"  (1.727 m)  Weight: 165 lb (74.844 kg)  SpO2: 97%     General:  well developed, well nourished, in no acute distress Head:  normocephalic and atraumatic Eyes:  PERRLA/EOM intact; conjunctiva and sclera clear Ears:  TMs intact and clear with normal canals Nose:  no deformity, discharge, inflammation, or lesions Mouth:  dentures Melampatti Class II.   Neck:  no masses, thyromegaly, or abnormal cervical nodes Chest Wall:  no deformities noted Lungs:  clear bilaterally to auscultation and percussion Heart:  regular rate and rhythm, S1, S2 without murmurs, rubs, gallops, or clicks Abdomen:  bowel  sounds positive; abdomen soft and non-tender without masses, or organomegaly Msk:  no deformity or scoliosis noted with normal posture Pulses:  pulses normal Extremities:  no clubbing, cyanosis, edema, or deformity noted Neurologic:  CN II-XII grossly intact with normal reflexes, coordination, muscle strength and tone Skin:  intact without lesions or rashes Cervical Nodes:  no significant adenopathy Axillary Nodes:  no significant adenopathy Psych:  alert and cooperative; normal mood and affect; normal attention span and concentration       Assessment & Plan:

## 2012-05-18 ENCOUNTER — Other Ambulatory Visit (INDEPENDENT_AMBULATORY_CARE_PROVIDER_SITE_OTHER): Payer: Medicare Other

## 2012-05-18 ENCOUNTER — Encounter: Payer: Self-pay | Admitting: Internal Medicine

## 2012-05-18 ENCOUNTER — Ambulatory Visit (INDEPENDENT_AMBULATORY_CARE_PROVIDER_SITE_OTHER): Payer: Medicare Other | Admitting: Internal Medicine

## 2012-05-18 VITALS — BP 140/80 | HR 83 | Temp 98.2°F | Ht 68.0 in | Wt 165.0 lb

## 2012-05-18 DIAGNOSIS — I2699 Other pulmonary embolism without acute cor pulmonale: Secondary | ICD-10-CM

## 2012-05-18 DIAGNOSIS — R0602 Shortness of breath: Secondary | ICD-10-CM | POA: Diagnosis not present

## 2012-05-18 DIAGNOSIS — J449 Chronic obstructive pulmonary disease, unspecified: Secondary | ICD-10-CM | POA: Diagnosis not present

## 2012-05-18 DIAGNOSIS — R0609 Other forms of dyspnea: Secondary | ICD-10-CM | POA: Diagnosis not present

## 2012-05-18 DIAGNOSIS — R0600 Dyspnea, unspecified: Secondary | ICD-10-CM

## 2012-05-18 DIAGNOSIS — I2782 Chronic pulmonary embolism: Secondary | ICD-10-CM

## 2012-05-18 DIAGNOSIS — R0989 Other specified symptoms and signs involving the circulatory and respiratory systems: Secondary | ICD-10-CM | POA: Diagnosis not present

## 2012-05-18 LAB — BASIC METABOLIC PANEL
CO2: 27 mEq/L (ref 19–32)
Chloride: 103 mEq/L (ref 96–112)
Creatinine, Ser: 1.5 mg/dL (ref 0.4–1.5)
Potassium: 4.3 mEq/L (ref 3.5–5.1)
Sodium: 139 mEq/L (ref 135–145)

## 2012-05-18 NOTE — Patient Instructions (Addendum)
In order to sort out your shortness of breath have CT angio chest, echo and PFT  REturn for followup after above Will refer you to coumadin clinic

## 2012-05-19 ENCOUNTER — Ambulatory Visit (INDEPENDENT_AMBULATORY_CARE_PROVIDER_SITE_OTHER)
Admission: RE | Admit: 2012-05-19 | Discharge: 2012-05-19 | Disposition: A | Discharge status: Home / Self Care | Payer: Medicare Other | Source: Ambulatory Visit | Attending: Internal Medicine | Admitting: Internal Medicine

## 2012-05-19 ENCOUNTER — Ambulatory Visit (INDEPENDENT_AMBULATORY_CARE_PROVIDER_SITE_OTHER): Payer: Medicare Other | Admitting: General Practice

## 2012-05-19 DIAGNOSIS — Z7901 Long term (current) use of anticoagulants: Secondary | ICD-10-CM | POA: Diagnosis not present

## 2012-05-19 DIAGNOSIS — I2699 Other pulmonary embolism without acute cor pulmonale: Secondary | ICD-10-CM

## 2012-05-19 DIAGNOSIS — R0600 Dyspnea, unspecified: Secondary | ICD-10-CM

## 2012-05-19 DIAGNOSIS — I2782 Chronic pulmonary embolism: Secondary | ICD-10-CM | POA: Insufficient documentation

## 2012-05-19 DIAGNOSIS — R0609 Other forms of dyspnea: Secondary | ICD-10-CM

## 2012-05-19 DIAGNOSIS — J449 Chronic obstructive pulmonary disease, unspecified: Secondary | ICD-10-CM

## 2012-05-19 LAB — POCT INR: INR: 1

## 2012-05-19 MED ORDER — WARFARIN SODIUM 2 MG PO TABS: 2.0000 mg | ORAL_TABLET | Freq: Every day | ORAL | Status: DC

## 2012-05-19 MED ORDER — IOHEXOL 350 MG/ML SOLN
80.0000 mL | Freq: Once | INTRAVENOUS | Status: AC | PRN
  Administered 2012-05-19: 80 mL via INTRAVENOUS

## 2012-05-22 ENCOUNTER — Telehealth: Payer: Self-pay | Admitting: Internal Medicine

## 2012-05-22 NOTE — Telephone Encounter (Signed)
I spoke with the pt and advised of results. He states he has had stones in the past and will call his urologists today. Carron Curie, CMA

## 2012-05-22 NOTE — Telephone Encounter (Signed)
CT shows a) no clot but b) has small node in chest and emphysema - all old and unchnaged. However, has 2 mm stone in upper pole left kidney with minimal left renal collecting system dilatation noted; This is called hydronephrosis and he should talk to his urologist. If he is having urinary symptoms, fever then he should call them urgently

## 2012-05-23 ENCOUNTER — Ambulatory Visit (HOSPITAL_COMMUNITY): Payer: Medicare Other | Attending: Internal Medicine | Admitting: Radiology

## 2012-05-23 DIAGNOSIS — K219 Gastro-esophageal reflux disease without esophagitis: Secondary | ICD-10-CM | POA: Diagnosis not present

## 2012-05-23 DIAGNOSIS — R0609 Other forms of dyspnea: Secondary | ICD-10-CM | POA: Diagnosis not present

## 2012-05-23 DIAGNOSIS — J449 Chronic obstructive pulmonary disease, unspecified: Secondary | ICD-10-CM

## 2012-05-23 DIAGNOSIS — G4733 Obstructive sleep apnea (adult) (pediatric): Secondary | ICD-10-CM | POA: Diagnosis not present

## 2012-05-23 DIAGNOSIS — Z87891 Personal history of nicotine dependence: Secondary | ICD-10-CM | POA: Diagnosis not present

## 2012-05-23 DIAGNOSIS — R0600 Dyspnea, unspecified: Secondary | ICD-10-CM

## 2012-05-23 DIAGNOSIS — I2699 Other pulmonary embolism without acute cor pulmonale: Secondary | ICD-10-CM

## 2012-05-23 DIAGNOSIS — R0989 Other specified symptoms and signs involving the circulatory and respiratory systems: Secondary | ICD-10-CM | POA: Insufficient documentation

## 2012-05-23 NOTE — Progress Notes (Signed)
Echocardiogram performed.  

## 2012-05-26 ENCOUNTER — Ambulatory Visit (INDEPENDENT_AMBULATORY_CARE_PROVIDER_SITE_OTHER): Payer: Medicare Other | Admitting: General Practice

## 2012-05-26 DIAGNOSIS — Z7901 Long term (current) use of anticoagulants: Secondary | ICD-10-CM

## 2012-05-26 DIAGNOSIS — I2699 Other pulmonary embolism without acute cor pulmonale: Secondary | ICD-10-CM

## 2012-05-26 DIAGNOSIS — I2782 Chronic pulmonary embolism: Secondary | ICD-10-CM | POA: Diagnosis not present

## 2012-05-26 LAB — POCT INR: INR: 2.2

## 2012-05-29 NOTE — Assessment & Plan Note (Signed)
HE is still dyspneic following PE. IS this due to recurrent PE, or CTEPH or deconditioning ?  To sort this, get ECHO, CT chest and reassess

## 2012-05-29 NOTE — Assessment & Plan Note (Signed)
He has been treated only for 3-4 months with anti coagulation following first episode of PE. Review of records shows negative hypercoag profile. Etipology likely was travel related but there wwere several breaks during trip. However, I have explained to him if etiology was idiopathic then there is 25% recurrence rate in first four years and any recurrence would commit him to life long anticoagulation. BAsed on this I have recommended 1-4 years of anticoagulation with first 1-2 years at INR Goal > 2 and the final part of it at INR goal > 1.5. We will reassess risk q6 months. He is agreeable to plan. Understands risk of bleed with prolonged coumadin  > 50% of this > 25 min visit spent in face to face counseling (15 min visit converted to 25 min)

## 2012-06-02 ENCOUNTER — Ambulatory Visit: Payer: Medicare Other | Admitting: Nurse Practitioner

## 2012-06-05 ENCOUNTER — Other Ambulatory Visit: Payer: Self-pay | Admitting: General Practice

## 2012-06-07 ENCOUNTER — Encounter: Payer: Self-pay | Admitting: *Deleted

## 2012-06-07 ENCOUNTER — Ambulatory Visit (INDEPENDENT_AMBULATORY_CARE_PROVIDER_SITE_OTHER): Payer: Medicare Other | Admitting: Physician Assistant

## 2012-06-07 VITALS — BP 136/68 | HR 72 | Ht 68.0 in | Wt 163.0 lb

## 2012-06-07 DIAGNOSIS — Z7901 Long term (current) use of anticoagulants: Secondary | ICD-10-CM | POA: Diagnosis not present

## 2012-06-07 DIAGNOSIS — J449 Chronic obstructive pulmonary disease, unspecified: Secondary | ICD-10-CM | POA: Diagnosis not present

## 2012-06-07 DIAGNOSIS — Z1211 Encounter for screening for malignant neoplasm of colon: Secondary | ICD-10-CM | POA: Diagnosis not present

## 2012-06-07 NOTE — Progress Notes (Signed)
Subjective:    Patient ID: Jon White, male    DOB: 1933/01/24, 79 y.o.   MRN: [id number]  HPI  Terrelle is a very nice 79 year old white male known previously to Dr. Oscar La with history of chronic GERD and esophageal stricture. He was last seen in 2012. He had EGD with esophageal dilation in 2010 for a distal stricture. He is referred today by his primary care physician Dr. Nicholos Johns  regarding colon neoplasia screening. Patient relates that he had one colonoscopy done approximately 14 years ago while living in [place] and was told this was a normal exam with no polyps found He currently has no GI symptoms, specifically no problems with changes in bowel habits constipation diarrhea melena or hematochezia. His history is pertinent for diagnosis of pulmonary embolism apparently bilateral in June of 2013. This occurred while he was in [place], he was hospitalized and placed on Coumadin. Apparently the Coumadin was stopped in October. He has since had reevaluation with pulmonologist Dr. Marchelle Gearing  and was placed back on Coumadin, he is advised to stay on Coumadin for one to 2 years at least and will be followed on a every 6 month basis . Patient relates that he has had any increase in shortness of breath since the pulmonary embolism which has been fairly persistent. He also has a diagnosis of COPD and says he has not been doing as well over the past several days with increased shortness of breath with exertion increase in sputum etc.  Family history is negative for colon cancer    Review of Systems  Constitutional: Negative.   HENT: Negative.   Eyes: Negative.   Respiratory: Positive for cough, shortness of breath and wheezing.   Cardiovascular: Negative.   Gastrointestinal: Negative.   Genitourinary: Negative.   Musculoskeletal: Negative.   Neurological: Negative.   Hematological: Negative.   Psychiatric/Behavioral: Negative.    Outpatient Prescriptions Prior to Visit  Medication Sig  Dispense Refill  . albuterol (VENTOLIN HFA) 108 (90 BASE) MCG/ACT inhaler Inhale 2 puffs into the lungs every 6 (six) hours as needed. For shortness of breath      . atorvastatin (LIPITOR) 10 MG tablet Take 10 mg by mouth daily. Last taken 09/01/2011 to see if he went off med. If the rash- legs & arms would resolve.  Since stopping med., rash has cleared      . budesonide-formoterol (SYMBICORT) 160-4.5 MCG/ACT inhaler Inhale 2 puffs into the lungs 2 (two) times daily.       . cetirizine (ZYRTEC) 10 MG tablet Take 10 mg by mouth daily.      . famotidine (PEPCID) 20 MG tablet Take 1 tablet (20 mg total) by mouth 2 (two) times daily.  30 tablet  0  . warfarin (COUMADIN) 2 MG tablet Take 1 tablet (2 mg total) by mouth daily. Take as directed by coumadin clinic.  60 tablet  3  . [DISCONTINUED] diphenhydrAMINE (SOMINEX) 25 MG tablet Take 25 mg by mouth at bedtime as needed.       Last reviewed on 06/07/2012 11:27 AM by Sammuel Cooper, PA  Allergies  Allergen Reactions  . Mometasone Furoate Other (See Comments)    CAUSED RED,ITCHY EYES; "lost my eyelashes" and Omnaris--nose bleed.  . Naproxen Sodium Nausea And Vomiting    "thought I was going to die"   Patient Active Problem List  Diagnosis  . ACUTE SINUSITIS, UNSPECIFIED  . STRICTURE AND STENOSIS OF ESOPHAGUS  . G E R D  .  OSA (obstructive sleep apnea)  . ENLARGEMENT OF LYMPH NODES  . SHORTNESS OF BREATH (SOB)  . COUGH  . PE (pulmonary thromboembolism)  . Chronic pulmonary embolism  . COPD (chronic obstructive pulmonary disease)   family history includes Asthma in his father; Heart disease in his mother; and Prostate cancer in his brothers.  There is no history of Anesthesia problems.     History  Substance Use Topics  . Smoking status: Former Smoker -- 1.0 packs/day for 60 years    Types: Cigarettes    Quit date: 08/06/2011  . Smokeless tobacco: Never Used  . Alcohol Use: 2.4 oz/week    4 Cans of beer per week     Comment: 09/15/11  "during summer"     Objective:   Physical Exam  well-developed elderly white male in no acute distress, accompanied by his wife, both quite pl; nontraumatic normocephalic EOMI PERRLA sclera anicteric, Neck; supple no JVD, Cardiovascular; regular rate and rhythm with S1-S2 no murmur or gallop, Pulmonary; bilateral wheezes, Abdomen soft nontender nondistended bowel sounds active there is no palpable mass or hepatosplenomegaly, Rectal; exam not done, Extremities; no clubbing cyanosis or edema skin warm and dry, Psych; mood and affect normal and appropriate        Assessment & Plan:  #3  79 year old male referred for colon neoplasia screening. Patient had a normal colonoscopy for 13-14 years ago and has no family history of colon cancer. He is currently asymptomatic.  #2 recent pulmonary embolism-June 2013, with plan for anticoagulation with Coumadin for one to 2 years. #3 COPD-increased exertional dyspnea- #4 chronic GERD with history of esophageal stricture-currently asymptomatic  Plan; Discussion with patient and his wife regarding risk/ benefit of colonoscopy, and particularly of increased risk  With stopping anti-  coagulation at this point for a screening exam. Patient will not be scheduled for colonoscopy at this time, would wait at least a year out from his pulmonary embolism  Before considering interrupting his anticoagulation. Pt  and wife are in agreement with this plan and we will plan a followup office visit in the fall of 2014.

## 2012-06-07 NOTE — Progress Notes (Signed)
Reviewed and agree with management. Conlee Sliter D. Brendt Dible, M.D., FACG  

## 2012-06-07 NOTE — Patient Instructions (Addendum)
We will send you a reminder letter to have a follow up visit with Dr. Melvia Heaps in the fall of this year, 2014. We will not be scheduling a colonoscopy at this time.

## 2012-06-09 ENCOUNTER — Ambulatory Visit (INDEPENDENT_AMBULATORY_CARE_PROVIDER_SITE_OTHER): Payer: Medicare Other | Admitting: General Practice

## 2012-06-09 DIAGNOSIS — Z7901 Long term (current) use of anticoagulants: Secondary | ICD-10-CM | POA: Diagnosis not present

## 2012-06-09 DIAGNOSIS — I2699 Other pulmonary embolism without acute cor pulmonale: Secondary | ICD-10-CM

## 2012-06-09 DIAGNOSIS — I2782 Chronic pulmonary embolism: Secondary | ICD-10-CM | POA: Diagnosis not present

## 2012-06-09 LAB — POCT INR: INR: 2.9

## 2012-06-12 ENCOUNTER — Ambulatory Visit (INDEPENDENT_AMBULATORY_CARE_PROVIDER_SITE_OTHER): Payer: Medicare Other | Admitting: Internal Medicine

## 2012-06-12 ENCOUNTER — Encounter: Payer: Self-pay | Admitting: Internal Medicine

## 2012-06-12 VITALS — BP 100/64 | HR 74 | Temp 97.8°F | Ht 68.0 in | Wt 161.0 lb

## 2012-06-12 DIAGNOSIS — R0602 Shortness of breath: Secondary | ICD-10-CM

## 2012-06-12 DIAGNOSIS — R0989 Other specified symptoms and signs involving the circulatory and respiratory systems: Secondary | ICD-10-CM | POA: Diagnosis not present

## 2012-06-12 DIAGNOSIS — R0600 Dyspnea, unspecified: Secondary | ICD-10-CM

## 2012-06-12 DIAGNOSIS — J449 Chronic obstructive pulmonary disease, unspecified: Secondary | ICD-10-CM

## 2012-06-12 DIAGNOSIS — R0609 Other forms of dyspnea: Secondary | ICD-10-CM

## 2012-06-12 DIAGNOSIS — I2699 Other pulmonary embolism without acute cor pulmonale: Secondary | ICD-10-CM

## 2012-06-12 LAB — PULMONARY FUNCTION TEST

## 2012-06-12 MED ORDER — DOXYCYCLINE HYCLATE 100 MG PO TABS: 100.0000 mg | ORAL_TABLET | Freq: Two times a day (BID) | ORAL | Status: DC

## 2012-06-12 MED ORDER — PREDNISONE 10 MG PO TABS: ORAL_TABLET | ORAL | Status: DC

## 2012-06-12 NOTE — Progress Notes (Signed)
PFT done today. 

## 2012-06-12 NOTE — Progress Notes (Signed)
Subjective:    Patient ID: Jon White, male    DOB: 03/02/1933, 79 y.o.   MRN: [id number]  HPI Visit Type:  Follow-up Copy to:  Dr. Jolene Provost Primary Provider/Referring Provider:  Dr Georgianne Fick PMD, DR [facility] ENT, Dr. Arlyce Dice GI, Dr Preston Callas - Allergy   OV April 2011:  Fu copd/asthma: chronic cough and cough variant asthma.,  Last visits March and November 2010. Since then has seen Dr. Lazarus Salines who is contemplating sinus surgery if medical Rx fails. He has continued nasal steroid ciclesonide, daily saline wash via nettpot. However, he did see Dr. [place] Callas for allergy eval. Reportedly skin testing mildly positive for mold but otherwise negative. Was started on singulair. Afer that sinus drainage is "90% better". Able to cope with current levels of sinus drainage. Using tissue only 4-5 times per day as oppsed to >10-20 times per day. Re: GERD. He is not on PPI. Still having some cough and tickle in throat after he eats. Wants to get rid of it. Does not think this element of cough is related to sinus drainage. No other complaints. No dyspnea. Contnues with symbicort. Due to go back to [facility] for 5 months starting October 29, 2009  Problem # 1:  COUGH (ICD-786.2)   Etiologys a) elated to sinus drainage and mucus; b) Silent GERD  (he has hiatal hernia & esophagitis on ct chest 02/2008 + hx of hiccups + hx of intermittent dysphagia -.sp eso dilataition 07/2008; and c)  Cough variant asthma coexisting based on recurrence of chest tightness after dc of symbicort 07/2008  > On 09/10/2009: improved control after Dr. [place] Callas added singulair to regimen in Mid -feb 2011. Stil lhaving some cough that is likely due to GERD versus Habit cough. He is not on PPI and wonder why. He is not concerned about aspirin as cause and is continuing it  plan Sinus per Dr Lazarus Salines nad Dr.  Callas GI per Dr Arlyce Dice but will add PPI nexium Cough Variant asthma - contnue symbicort He will call in 1 month. If element of  cough that is thought as due to GERD is betteer then we will continue PPI. Otherwise, will consider neurontin for habit cough Will get records from DR sharma     Patient Instructions: 1)  continue your medicines 2)  add nexium to your current regimen 3)  take it on empty stomach 4)  follow GERD diet advice 5)  Call me in 1 month to see if cough even better 6)  Depending on your call I will advise you next step   OV 05/18/2012  Not seen in > 2 years. He has been referred back for new problem of PE. IN November 18, 2011 one week after long car trip to [place] he was helping someone out with some heavy lifting at camp site and then became acutely unwell. Rushed to [facility] ER. Where per hx andreview of recoords: He was diagnosed with DVT left popliteal veint and RLL Sup Segment PE on CT June 201, 2013. Other tests showed: trop < 0.04, ddimer 5.6,  ECHO 11/19/11 - Normal LVEF. Mild TR, Mild PHA. Mild LVH. Rx as PE for 3-4 months with coumadin. Currently dyspneic ever since PE - simple activitis make him dyspneic. Gained 8#. Also 6 days in hospital. Walking desaturation test on 05/18/2012 185 feet x 3 laps:  did NOT desaturate. Rest pulse ox was 96%, final pulse ox was 91%. HR response 71/min at rest to 89/min at peak exertion.  Of note, CT June 2013 in [facility] per their report (image n/a)  was significant for other findings that include: a) emphysema; b)   abnormal esophagus, hiatal hernia - rec endoscopy, and c) RML and R Anterior Base infiltrates - rad recommending followup   REC In order to sort out your shortness of breath have CT angio chest, echo and PFT  REturn for followup after above  Will refer you to coumadin clinic  OV 06/12/12 Presents with wife. Here to review test results. He is now back on coumadin with INR > 2. Still no change in dyspnea. This is the dyspnea that got worse after the PE diagnosis. Reports no new complaints but main issue is persistent dyspnea class 2-3 along  with cough and a severe inability / struggle to bring out sputumwhich is scanty and yellopw. This has made voice hoarse past few weeks. Otherwise all same and he is frustrated. Reviewed meds: compliant with symbicort. He is not on spiriva; tried it bfore but quit due to urinary incontinence. He feels nebs would be better choice for him but willing to try tudorza    CT shows a) no clot but b) has small node in chest and emphysema - all old and unchnaged. However, has 2 mm stone in upper pole left kidney with minimal left renal collecting system dilatation noted; This is called hydronephrosis and he has contacted his urologist about it.   ECHO 1224/13  - PA pressure could not be estimated but essentially nromal Echo so PAP thought to be normal  PFT  06/12/12 PFT FVC fev1 ratio BD fev1 TLC DLCO coments  06/12/2012  2.7L/70% 1.1L/42% 39(67) 38% 6.8L/114% 9.7/56% Copd with asthma component. Huge BD response    Past, Family, Social reviewed: no change since last visit  Review of Systems  Constitutional: Negative for fever and unexpected weight change.  HENT: Negative for ear pain, nosebleeds, congestion, sore throat, rhinorrhea, sneezing, trouble swallowing, dental problem, postnasal drip and sinus pressure.   Eyes: Negative for redness and itching.  Respiratory: Positive for cough. Negative for chest tightness, shortness of breath and wheezing.   Cardiovascular: Negative for palpitations and leg swelling.  Gastrointestinal: Negative for nausea and vomiting.  Genitourinary: Negative for dysuria.  Musculoskeletal: Negative for joint swelling.  Skin: Negative for rash.  Neurological: Negative for headaches.  Hematological: Does not bruise/bleed easily.  Psychiatric/Behavioral: Negative for dysphoric mood. The patient is not nervous/anxious.   Current outpatient prescriptions:albuterol (VENTOLIN HFA) 108 (90 BASE) MCG/ACT inhaler, Inhale 2 puffs into the lungs every 6 (six) hours as needed. For  shortness of breath, Disp: , Rfl: ;  atorvastatin (LIPITOR) 10 MG tablet, Take 10 mg by mouth daily. Last taken 09/01/2011 to see if he went off med. If the rash- legs & arms would resolve.  Since stopping med., rash has cleared, Disp: , Rfl:  budesonide-formoterol (SYMBICORT) 160-4.5 MCG/ACT inhaler, Inhale 2 puffs into the lungs 2 (two) times daily. , Disp: , Rfl: ;  Calcium Carbonate-Vitamin D (CALTRATE 600+D) 600-400 MG-UNIT per tablet, Take 1 tablet by mouth 2 (two) times daily. With meals., Disp: , Rfl: ;  cetirizine (ZYRTEC) 10 MG tablet, Take 10 mg by mouth daily., Disp: , Rfl:  famotidine (PEPCID) 20 MG tablet, Take 1 tablet (20 mg total) by mouth 2 (two) times daily., Disp: 30 tablet, Rfl: 0;  fluocinonide cream (LIDEX) 0.05 %, Apply 1 application topically daily. To infected area., Disp: , Rfl: ;  fluticasone (FLONASE) 50 MCG/ACT nasal spray, Place 2  sprays into the nose daily. Each nostril once a day., Disp: , Rfl:  warfarin (COUMADIN) 2 MG tablet, Take 1 tablet (2 mg total) by mouth daily. Take as directed by coumadin clinic., Disp: 60 tablet, Rfl: 3      Objective:   Physical Exam General:  well developed, well nourished, in no acute distress Head:  normocephalic and atraumatic Eyes:  PERRLA/EOM intact; conjunctiva and sclera clear Ears:  TMs intact and clear with normal canals Nose:  no deformity, discharge, inflammation, or lesions Mouth:  dentures Melampatti Class II.   Neck:  no masses, thyromegaly, or abnormal cervical nodes Chest Wall:  no deformities noted Lungs:  Some wheeze + Heart:  regular rate and rhythm, S1, S2 without murmurs, rubs, gallops, or clicks Abdomen:  bowel sounds positive; abdomen soft and non-tender without masses, or organomegaly Msk:  no deformity or scoliosis noted with normal posture Pulses:  pulses normal Extremities:  no clubbing, cyanosis, edema, or deformity noted Neurologic:  CN II-XII grossly intact with normal reflexes, coordination, muscle  strength and tone Skin:  intact without lesions or rashes Cervical Nodes:  no significant adenopathy Axillary Nodes:  no significant adenopathy Psych:  alert and cooperative; normal mood and affect; normal attention span and concentration         Assessment & Plan:

## 2012-06-12 NOTE — Patient Instructions (Addendum)
The reasoon you got more short of breath after blood clot treatment is not because of blood clot issues but becasuse asthma/copd is acting up significantly Take doxycycline 100mg po twice daily x 5 days; take after meals and avoid sunlight Please take Take prednisone 40mg once daily x 3 days, then 30mg once daily x 3 days, then 20mg once daily x 3 days, then prednisone 10mg once daily  x 3 days and stop Continue symbicort 2 puff twice daily Try tudorza 1 puff twice a day  - learn technique. Monitor for urinary retention. IF this is a problem, call us immediately and stop the drug Call me in 2 weeks to report response I will see you in 4 weeks - 6 weeks to see progress IF above strategy does not work , we can try BREO mdi or nebs At followup do spirometry 

## 2012-06-13 NOTE — Assessment & Plan Note (Signed)
The reasoon you got more short of breath after blood clot treatment is not because of blood clot issues but becasuse asthma/copd is acting up significantly Take doxycycline 100mg  po twice daily x 5 days; take after meals and avoid sunlight Please take Take prednisone 40mg  once daily x 3 days, then 30mg  once daily x 3 days, then 20mg  once daily x 3 days, then prednisone 10mg  once daily  x 3 days and stop Continue symbicort 2 puff twice daily Try tudorza 1 puff twice a day  - learn technique. Monitor for urinary retention. IF this is a problem, call us immediately and stop the drug Call me in 2 weeks to report response I will see you in 4 weeks - 6 weeks to see progress IF above strategy does not work , we can try BREO mdi or nebs At followup do spirometry

## 2012-06-26 ENCOUNTER — Telehealth: Payer: Self-pay | Admitting: Internal Medicine

## 2012-06-26 MED ORDER — ACLIDINIUM BROMIDE 400 MCG/ACT IN AEPB: 1.0000 | INHALATION_SPRAY | Freq: Two times a day (BID) | RESPIRATORY_TRACT | Status: DC

## 2012-06-26 NOTE — Telephone Encounter (Signed)
Spoke with patient, patient told to give a 2week update on how meds were working for him.  Patient states that the [employer] is working very well, decrease in his SOB and so far it is not causing any urinary problems.  Patient also states that the prednisone is working well and "most of the phlegm is gone."   Last OV: 06/12/12 Patient Instructions     The reasoon you got more short of breath after blood clot treatment is not because of blood clot issues but becasuse asthma/copd is acting up significantly  Take doxycycline 100mg  po twice daily x 5 days; take after meals and avoid sunlight  Please take Take prednisone 40mg  once daily x 3 days, then 30mg  once daily x 3 days, then 20mg  once daily x 3 days, then prednisone 10mg  once daily x 3 days and stop  Continue symbicort 2 puff twice daily  Try tudorza 1 puff twice a day - learn technique. Monitor for urinary retention. IF this is a problem, call us immediately and stop the drug  Call me in 2 weeks to report response  I will see you in 4 weeks - 6 weeks to see progress  IF above strategy does not work , we can try BREO mdi or nebs  At followup do spirometry    Patient scheduled for F/U:07/11/11 @1130am   Will forward to MR as [facility]

## 2012-06-26 NOTE — Telephone Encounter (Signed)
Pt advised and rx sent. Jennifer Castillo, CMA  

## 2012-06-26 NOTE — Telephone Encounter (Signed)
Ok will need to prescribe tudorza for him. I think he went with sample. Will likely need priuor auth - sprivia caused urinary retention

## 2012-06-28 ENCOUNTER — Telehealth: Payer: Self-pay | Admitting: Internal Medicine

## 2012-06-28 NOTE — Telephone Encounter (Signed)
I spoke with pharmacy and they provided PA # [phone]. I will call and initiate PA.Makina Skow Yancey Flemings, CMA

## 2012-06-28 NOTE — Telephone Encounter (Signed)
He had urinary retention with Spiriva and therefore cannot use it. He needs TUDORZA.

## 2012-06-28 NOTE — Telephone Encounter (Signed)
Insurance does not cover tudorza but will cover spiriva, is this an ok alternative? Pt states tudorza has been working well for him and has noticed an improvement in his breathing.  Please advise. Carron Curie, CMA

## 2012-06-30 NOTE — Telephone Encounter (Signed)
Called for PA at [phone].  Member # [id number]. Jon White has been APPROVED until 05/30/13.  Pharmacy has been notified. Patient has been notified.

## 2012-07-04 ENCOUNTER — Ambulatory Visit (INDEPENDENT_AMBULATORY_CARE_PROVIDER_SITE_OTHER): Payer: Medicare Other | Admitting: General Practice

## 2012-07-04 DIAGNOSIS — I2782 Chronic pulmonary embolism: Secondary | ICD-10-CM

## 2012-07-04 DIAGNOSIS — I2699 Other pulmonary embolism without acute cor pulmonale: Secondary | ICD-10-CM

## 2012-07-04 DIAGNOSIS — Z7901 Long term (current) use of anticoagulants: Secondary | ICD-10-CM | POA: Diagnosis not present

## 2012-07-04 LAB — POCT INR: INR: 3.3

## 2012-07-10 ENCOUNTER — Ambulatory Visit (INDEPENDENT_AMBULATORY_CARE_PROVIDER_SITE_OTHER): Payer: Medicare Other | Admitting: Internal Medicine

## 2012-07-10 ENCOUNTER — Encounter: Payer: Self-pay | Admitting: Internal Medicine

## 2012-07-10 VITALS — BP 120/72 | HR 74 | Temp 98.4°F | Ht 68.0 in | Wt 167.4 lb

## 2012-07-10 DIAGNOSIS — R0602 Shortness of breath: Secondary | ICD-10-CM

## 2012-07-10 DIAGNOSIS — I2699 Other pulmonary embolism without acute cor pulmonale: Secondary | ICD-10-CM

## 2012-07-10 DIAGNOSIS — I2782 Chronic pulmonary embolism: Secondary | ICD-10-CM | POA: Diagnosis not present

## 2012-07-10 DIAGNOSIS — J449 Chronic obstructive pulmonary disease, unspecified: Secondary | ICD-10-CM | POA: Diagnosis not present

## 2012-07-10 NOTE — Patient Instructions (Addendum)
#  COPD Continue symbicort 2 puff twice daily  tudorza 1 puff twice a day   Use albuterol as needed I recommend you  take N acetylcysteine 600 mg twice a day.   - There is research from [place] published in [place] journal that shows if you takes N-acetylcysteine  cuts down acute exacerbation COPD by 20%.  The drug functions by being an anti-oxidant.  This drug will not make Jon White  feel any better but will make the chances of this having an exacerbation less likely The drug is cheap and is over-the-counter at [facility] vitamin store or [employer]. There are no side effects. I have experience with with the drug with pulmonary fibrosis patients so I recommend it strongly  REturn in October 2014 or sooner if needed Ensure fu in [place] during summer 2014  #Sinus draingae  - use netti pot or 3% neill-med saline spray daily at night for nose - continue other measures advised by Dr Suszanne Conners  #[place]ol[place]up  - Oct 2014

## 2012-07-10 NOTE — Progress Notes (Signed)
Subjective:    Patient ID: Jon White, male    DOB: [birth year], 79 y.o.   MRN: [id number]  HPI Visit Type:  Follow-up Copy to:  Dr. Jolene Provost Primary Provider/Referring Provider:  Dr Georgianne Fick PMD, DR [facility] ENT, Dr. Arlyce Dice GI, Dr [place] Callas - Allergy   OV April 2011:  Fu copd/asthma: chronic cough and cough variant asthma.,  Last visits March and November 2010. Since then has seen Dr. Lazarus Salines who is contemplating sinus surgery if medical Rx fails. He has continued nasal steroid ciclesonide, daily saline wash via nettpot. However, he did see Dr. [place] Callas for allergy eval. Reportedly skin testing mildly positive for mold but otherwise negative. Was started on singulair. Afer that sinus drainage is "90% better". Able to cope with current levels of sinus drainage. Using tissue only 4-5 times per day as oppsed to >10-20 times per day. Re: GERD. He is not on PPI. Still having some cough and tickle in throat after he eats. Wants to get rid of it. Does not think this element of cough is related to sinus drainage. No other complaints. No dyspnea. Contnues with symbicort. Due to go back to [facility] for 5 months starting October 29, 2009  Problem # 1:  COUGH (ICD-786.2)   Etiologys a) elated to sinus drainage and mucus; b) Silent GERD  (he has hiatal hernia & esophagitis on ct chest 02/2008 + hx of hiccups + hx of intermittent dysphagia -.sp eso dilataition 07/2008; and c)  Cough variant asthma coexisting based on recurrence of chest tightness after dc of symbicort 07/2008  > On 09/10/2009: improved control after Dr. [place] Callas added singulair to regimen in Mid -feb 2011. Stil lhaving some cough that is likely due to GERD versus Habit cough. He is not on PPI and wonder why. He is not concerned about aspirin as cause and is continuing it  plan Sinus per Dr Lazarus Salines nad Dr.  Callas GI per Dr Arlyce Dice but will add PPI nexium Cough Variant asthma - contnue symbicort He will call in 1 month. If element of  cough that is thought as due to GERD is betteer then we will continue PPI. Otherwise, will consider neurontin for habit cough Will get records from DR sharma     Patient Instructions: 1)  continue your medicines 2)  add nexium to your current regimen 3)  take it on empty stomach 4)  follow GERD diet advice 5)  Call me in 1 month to see if cough even better 6)  Depending on your call I will advise you next step   OV 05/18/2012  Not seen in > 2 years. He has been referred back for new problem of PE. IN November 18, 2011 one week after long car trip to [place] he was helping someone out with some heavy lifting at camp site and then became acutely unwell. Rushed to [facility] ER. Where per hx andreview of recoords: He was diagnosed with DVT left popliteal veint and RLL Sup Segment PE on CT June 201, 2013. Other tests showed: trop < 0.04, ddimer 5.6,  ECHO 11/19/11 - Normal LVEF. Mild TR, Mild PHA. Mild LVH. Rx as PE for 3-4 months with coumadin. Currently dyspneic ever since PE - simple activitis make him dyspneic. Gained 8#. Also 6 days in hospital. Walking desaturation test on 05/18/2012 185 feet x 3 laps:  did NOT desaturate. Rest pulse ox was 96%, final pulse ox was 91%. HR response 71/min at rest to 89/min at peak exertion.  Of note, CT June 2013 in [facility] per their report (image n/a)  was significant for other findings that include: a) emphysema; b)   abnormal esophagus, hiatal hernia - rec endoscopy, and c) RML and R Anterior Base infiltrates - rad recommending followup   REC In order to sort out your shortness of breath have CT angio chest, echo and PFT  REturn for followup after above  Will refer you to coumadin clinic  OV 06/12/12 Presents with wife. Here to review test results. He is now back on coumadin with INR > 2. Still no change in dyspnea. This is the dyspnea that got worse after the PE diagnosis. Reports no new complaints but main issue is persistent dyspnea class 2-3 along  with cough and a severe inability / struggle to bring out sputumwhich is scanty and yellopw. This has made voice hoarse past few weeks. Otherwise all same and he is frustrated. Reviewed meds: compliant with symbicort. He is not on spiriva; tried it bfore but quit due to urinary incontinence. He feels nebs would be better choice for him but willing to try tudorza    CT shows a) no clot but b) has small node in chest and emphysema - all old and unchnaged. However, has 2 mm stone in upper pole left kidney with minimal left renal collecting system dilatation noted; This is called hydronephrosis and he has contacted his urologist about it.   ECHO 1224/13  - PA pressure could not be estimated but essentially nromal Echo so PAP thought to be normal  PFT  06/12/12 PFT FVC fev1 ratio BD fev1 TLC DLCO coments  06/12/2012  2.7L/70% 1.1L/42% 39(67) 38% 6.8L/114% 9.7/56% Copd with asthma component. Huge BD response  07/10/2012   3.8 L/92%   1.97 L/64%   52      almost 1 L improvement since starting  tudorza    Past, Family, Social reviewed: no change since last visit  REC The reasoon you got more short of breath after blood clot treatment is not because of blood clot issues but becasuse asthma/copd is acting up significantly  Take doxycycline 100mg  po twice daily x 5 days; take after meals and avoid sunlight  Please take Take prednisone 40mg  once daily x 3 days, then 30mg  once daily x 3 days, then 20mg  once daily x 3 days, then prednisone 10mg  once daily x 3 days and stop  Continue symbicort 2 puff twice daily  Try tudorza 1 puff twice a day - learn technique. Monitor for urinary retention. IF this is a problem, call us immediately and stop the drug  Call me in 2 weeks to report response  I will see you in 4 weeks - 6 weeks to see progress  IF above strategy does not work , we can try BREO mdi or nebs  At followup do spirometry    OV 07/10/2012 - [name] returns for followup. After startinng  Carlos American he feels 80% better. On his spirometry his FEV1 has improved almost a liter from 1.1 L to 1.97 L/64%. He continue Symbicort. He still has some residual mucus production that is blotchy postnasal drip and yellow color early in the morning. He uses his nasal steroids diligently but does not use nasal saline spray regularly. He never used Nettie pot or hypertonic saline spray   - There no new issues and for the spring 2014 he plans to go to [place] as part of his annual spring to summer visit  PFT  06/12/12 PFT FVC fev1 ratio BD fev1 TLC DLCO coments  06/12/2012  2.7L/70% 1.1L/42% 39(67) 38% 6.8L/114% 9.7/56% Copd with asthma component. Huge BD response  07/10/2012   3.8 L/92%   1.97 L/64%   52      almost 1 L improvement since starting  tudorza        Review of Systems  Constitutional: Negative for fever and unexpected weight change.  HENT: Negative for ear pain, nosebleeds, congestion, sore throat, rhinorrhea, sneezing, trouble swallowing, dental problem, postnasal drip and sinus pressure.   Eyes: Negative for redness and itching.  Respiratory: Negative for cough, chest tightness, shortness of breath and wheezing.   Cardiovascular: Negative for palpitations and leg swelling.  Gastrointestinal: Negative for nausea and vomiting.  Genitourinary: Negative for dysuria.  Musculoskeletal: Negative for joint swelling.  Skin: Negative for rash.  Neurological: Negative for headaches.  Hematological: Does not bruise/bleed easily.  Psychiatric/Behavioral: Negative for dysphoric mood. The patient is not nervous/anxious.        Objective:   Physical Exam General:  well developed, well nourished, in no acute distress Head:  normocephalic and atraumatic Eyes:  PERRLA/EOM intact; conjunctiva and sclera clear Ears:  TMs intact and clear with normal canals Nose:  no deformity, discharge, inflammation, or lesions Mouth:  dentures Melampatti Class II.   Neck:  no masses, thyromegaly, or  abnormal cervical nodes Chest Wall:  no deformities noted Lungs: No wheezes this visit all resolved  Heart:  regular rate and rhythm, S1, S2 without murmurs, rubs, gallops, or clicks Abdomen:  bowel sounds positive; abdomen soft and non-tender without masses, or organomegaly Msk:  no deformity or scoliosis noted with normal posture Pulses:  pulses normal Extremities:  no clubbing, cyanosis, edema, or deformity noted Neurologic:  CN II-XII grossly intact with normal reflexes, coordination, muscle strength and tone Skin:  intact without lesions or rashes Cervical Nodes:  no significant adenopathy Axillary Nodes:  no significant adenopathy Psych:  alert and cooperative; normal mood and affect; normal attention span and concentration         Assessment & Plan:

## 2012-07-11 NOTE — Assessment & Plan Note (Signed)
#  COPD Continue symbicort 2 puff twice daily  tudorza 1 puff twice a day   Use albuterol as needed I recommend you  take N acetylcysteine 600 mg twice a day.   - There is research from [place] published in [place] journal that shows if you takes N-acetylcysteine  cuts down acute exacerbation COPD by 20%.  The drug functions by being an anti-oxidant.  This drug will not make Jon White  feel any better but will make the chances of this having an exacerbation less likely The drug is cheap and is over-the-counter at [facility] vitamin store or [employer]. There are no side effects. I have experience with with the drug with pulmonary fibrosis patients so I recommend it strongly  REturn in October 2014 or sooner if needed Ensure fu in [place] during summer 2014 #Folllowup  - Oct 2014

## 2012-07-11 NOTE — Assessment & Plan Note (Signed)
I have recommended 1-4 years of anticoagulation with first 1-2 years at INR Goal > 2 and the final part of it at INR goal > 1.5. We will reassess risk q6 months

## 2012-07-25 ENCOUNTER — Ambulatory Visit (INDEPENDENT_AMBULATORY_CARE_PROVIDER_SITE_OTHER): Payer: Medicare Other | Admitting: General Practice

## 2012-07-25 DIAGNOSIS — Z7901 Long term (current) use of anticoagulants: Secondary | ICD-10-CM | POA: Diagnosis not present

## 2012-07-25 DIAGNOSIS — I2782 Chronic pulmonary embolism: Secondary | ICD-10-CM | POA: Diagnosis not present

## 2012-07-25 DIAGNOSIS — I2699 Other pulmonary embolism without acute cor pulmonale: Secondary | ICD-10-CM

## 2012-08-15 ENCOUNTER — Ambulatory Visit (INDEPENDENT_AMBULATORY_CARE_PROVIDER_SITE_OTHER): Payer: Medicare Other | Admitting: General Practice

## 2012-08-15 DIAGNOSIS — Z7901 Long term (current) use of anticoagulants: Secondary | ICD-10-CM

## 2012-08-15 DIAGNOSIS — I2782 Chronic pulmonary embolism: Secondary | ICD-10-CM | POA: Diagnosis not present

## 2012-08-15 DIAGNOSIS — I2699 Other pulmonary embolism without acute cor pulmonale: Secondary | ICD-10-CM

## 2012-08-21 ENCOUNTER — Other Ambulatory Visit: Payer: Self-pay | Admitting: General Practice

## 2012-08-21 MED ORDER — WARFARIN SODIUM 2 MG PO TABS: ORAL_TABLET | ORAL | Status: DC

## 2012-09-05 ENCOUNTER — Ambulatory Visit (INDEPENDENT_AMBULATORY_CARE_PROVIDER_SITE_OTHER): Payer: Medicare Other | Admitting: General Practice

## 2012-09-05 DIAGNOSIS — I2699 Other pulmonary embolism without acute cor pulmonale: Secondary | ICD-10-CM

## 2012-09-05 DIAGNOSIS — I2782 Chronic pulmonary embolism: Secondary | ICD-10-CM | POA: Diagnosis not present

## 2012-09-05 DIAGNOSIS — Z7901 Long term (current) use of anticoagulants: Secondary | ICD-10-CM

## 2012-09-19 ENCOUNTER — Other Ambulatory Visit: Payer: Self-pay | Admitting: Internal Medicine

## 2012-09-21 ENCOUNTER — Other Ambulatory Visit: Payer: Self-pay | Admitting: General Practice

## 2012-09-21 MED ORDER — WARFARIN SODIUM 2 MG PO TABS: ORAL_TABLET | ORAL | Status: DC

## 2012-10-03 ENCOUNTER — Ambulatory Visit (INDEPENDENT_AMBULATORY_CARE_PROVIDER_SITE_OTHER): Payer: Medicare Other | Admitting: General Practice

## 2012-10-03 DIAGNOSIS — I2699 Other pulmonary embolism without acute cor pulmonale: Secondary | ICD-10-CM

## 2012-10-03 DIAGNOSIS — Z7901 Long term (current) use of anticoagulants: Secondary | ICD-10-CM | POA: Diagnosis not present

## 2012-10-03 DIAGNOSIS — G4733 Obstructive sleep apnea (adult) (pediatric): Secondary | ICD-10-CM | POA: Diagnosis not present

## 2012-10-03 DIAGNOSIS — M1990 Unspecified osteoarthritis, unspecified site: Secondary | ICD-10-CM | POA: Diagnosis not present

## 2012-10-03 DIAGNOSIS — E782 Mixed hyperlipidemia: Secondary | ICD-10-CM | POA: Diagnosis not present

## 2012-10-03 DIAGNOSIS — I2782 Chronic pulmonary embolism: Secondary | ICD-10-CM | POA: Diagnosis not present

## 2012-10-03 LAB — POCT INR: INR: 2

## 2012-10-04 DIAGNOSIS — J330 Polyp of nasal cavity: Secondary | ICD-10-CM | POA: Diagnosis not present

## 2012-10-04 DIAGNOSIS — H6120 Impacted cerumen, unspecified ear: Secondary | ICD-10-CM | POA: Diagnosis not present

## 2012-10-04 DIAGNOSIS — J320 Chronic maxillary sinusitis: Secondary | ICD-10-CM | POA: Diagnosis not present

## 2012-10-04 DIAGNOSIS — D2320 Other benign neoplasm of skin of unspecified ear and external auricular canal: Secondary | ICD-10-CM | POA: Diagnosis not present

## 2012-10-10 DIAGNOSIS — C61 Malignant neoplasm of prostate: Secondary | ICD-10-CM | POA: Diagnosis not present

## 2012-10-10 DIAGNOSIS — R635 Abnormal weight gain: Secondary | ICD-10-CM | POA: Diagnosis not present

## 2012-10-10 DIAGNOSIS — G4733 Obstructive sleep apnea (adult) (pediatric): Secondary | ICD-10-CM | POA: Diagnosis not present

## 2012-10-10 DIAGNOSIS — E782 Mixed hyperlipidemia: Secondary | ICD-10-CM | POA: Diagnosis not present

## 2012-10-12 DIAGNOSIS — C44221 Squamous cell carcinoma of skin of unspecified ear and external auricular canal: Secondary | ICD-10-CM | POA: Diagnosis not present

## 2012-10-12 DIAGNOSIS — D485 Neoplasm of uncertain behavior of skin: Secondary | ICD-10-CM | POA: Diagnosis not present

## 2012-10-16 DIAGNOSIS — C61 Malignant neoplasm of prostate: Secondary | ICD-10-CM | POA: Diagnosis not present

## 2012-10-16 DIAGNOSIS — N200 Calculus of kidney: Secondary | ICD-10-CM | POA: Diagnosis not present

## 2012-10-31 ENCOUNTER — Telehealth: Payer: Self-pay | Admitting: Internal Medicine

## 2012-10-31 ENCOUNTER — Ambulatory Visit (INDEPENDENT_AMBULATORY_CARE_PROVIDER_SITE_OTHER): Payer: Medicare Other | Admitting: Family Medicine

## 2012-10-31 DIAGNOSIS — I2699 Other pulmonary embolism without acute cor pulmonale: Secondary | ICD-10-CM

## 2012-10-31 DIAGNOSIS — Z7901 Long term (current) use of anticoagulants: Secondary | ICD-10-CM | POA: Diagnosis not present

## 2012-10-31 NOTE — Telephone Encounter (Signed)
LMTCB

## 2012-11-01 MED ORDER — ACLIDINIUM BROMIDE 400 MCG/ACT IN AEPB: 1.0000 | INHALATION_SPRAY | Freq: Two times a day (BID) | RESPIRATORY_TRACT | Status: DC

## 2012-11-01 MED ORDER — BUDESONIDE-FORMOTEROL FUMARATE 160-4.5 MCG/ACT IN AERO: 2.0000 | INHALATION_SPRAY | Freq: Two times a day (BID) | RESPIRATORY_TRACT | Status: DC

## 2012-11-01 NOTE — Telephone Encounter (Signed)
Pt aware samples left upfront for pick up. Nothing further was needed

## 2012-11-30 DIAGNOSIS — Z7901 Long term (current) use of anticoagulants: Secondary | ICD-10-CM | POA: Diagnosis not present

## 2012-12-07 ENCOUNTER — Encounter: Payer: Self-pay | Admitting: Gastroenterology

## 2012-12-19 DIAGNOSIS — Z7901 Long term (current) use of anticoagulants: Secondary | ICD-10-CM | POA: Diagnosis not present

## 2013-01-02 DIAGNOSIS — Z7901 Long term (current) use of anticoagulants: Secondary | ICD-10-CM | POA: Diagnosis not present

## 2013-01-23 DIAGNOSIS — Z961 Presence of intraocular lens: Secondary | ICD-10-CM | POA: Diagnosis not present

## 2013-01-25 DIAGNOSIS — Z5181 Encounter for therapeutic drug level monitoring: Secondary | ICD-10-CM | POA: Diagnosis not present

## 2013-01-30 ENCOUNTER — Other Ambulatory Visit: Payer: Self-pay | Admitting: General Practice

## 2013-01-30 DIAGNOSIS — Z7901 Long term (current) use of anticoagulants: Secondary | ICD-10-CM | POA: Diagnosis not present

## 2013-01-30 DIAGNOSIS — Z23 Encounter for immunization: Secondary | ICD-10-CM | POA: Diagnosis not present

## 2013-01-30 MED ORDER — WARFARIN SODIUM 2 MG PO TABS: ORAL_TABLET | ORAL | Status: DC

## 2013-02-13 DIAGNOSIS — Z7901 Long term (current) use of anticoagulants: Secondary | ICD-10-CM | POA: Diagnosis not present

## 2013-02-27 DIAGNOSIS — Z7901 Long term (current) use of anticoagulants: Secondary | ICD-10-CM | POA: Diagnosis not present

## 2013-03-19 ENCOUNTER — Ambulatory Visit (INDEPENDENT_AMBULATORY_CARE_PROVIDER_SITE_OTHER): Payer: Medicare Other | Admitting: Internal Medicine

## 2013-03-19 ENCOUNTER — Encounter: Payer: Self-pay | Admitting: Internal Medicine

## 2013-03-19 VITALS — BP 108/62 | HR 62 | Ht 68.0 in | Wt 175.0 lb

## 2013-03-19 DIAGNOSIS — J310 Chronic rhinitis: Secondary | ICD-10-CM

## 2013-03-19 DIAGNOSIS — I2699 Other pulmonary embolism without acute cor pulmonale: Secondary | ICD-10-CM | POA: Diagnosis not present

## 2013-03-19 DIAGNOSIS — J329 Chronic sinusitis, unspecified: Secondary | ICD-10-CM | POA: Diagnosis not present

## 2013-03-19 DIAGNOSIS — J449 Chronic obstructive pulmonary disease, unspecified: Secondary | ICD-10-CM

## 2013-03-19 NOTE — Progress Notes (Signed)
Subjective:    Patient ID: Jon White, male    DOB: 1933/04/19, 79 y.o.   MRN: [id number]  HPI Visit Type:  Follow-up Copy to:  Dr. Jolene White Primary Provider/Referring Provider:  Dr Jon White PMD, DR Jon White ENT, Dr. Arlyce White GI, Dr Jon White - Allergy   OV April 2011:  Fu copd/asthma: chronic cough and cough variant asthma.,  Last visits March and November 2010. Since then has seen Dr. Lazarus White who is contemplating sinus surgery if medical Rx fails. He has continued nasal steroid ciclesonide, daily saline wash via nettpot. However, he did see Dr. Ranger White for allergy eval. Reportedly skin testing mildly positive for mold but otherwise negative. Was started on singulair. Afer that sinus drainage is "90% better". Able to cope with current levels of sinus drainage. Using tissue only 4-5 times per day as oppsed to >10-20 times per day. Re: GERD. He is not on PPI. Still having some cough and tickle in throat after he eats. Wants to get rid of it. Does not think this element of cough is related to sinus drainage. No other complaints. No dyspnea. Contnues with symbicort. Due to go back to [facility] for 5 months starting October 29, 2009  Problem # 1:  COUGH (ICD-786.2)   Etiologys a) elated to sinus drainage and mucus; b) Silent GERD  (he has hiatal hernia & esophagitis on ct chest 02/2008 + hx of hiccups + hx of intermittent dysphagia -.sp eso dilataition 07/2008; and c)  Cough variant asthma coexisting based on recurrence of chest tightness after dc of symbicort 07/2008  > On 09/10/2009: improved control after Dr. [place] White added singulair to regimen in Mid -feb 2011. Stil lhaving some cough that is likely due to GERD versus Habit cough. He is not on PPI and wonder why. He is not concerned about aspirin as cause and is continuing it  plan Sinus per Dr Jon White nad Dr. [place] White GI per Dr Jon White but will add PPI nexium Cough Variant asthma - contnue symbicort He will call in 1 month. If element of  cough that is thought as due to GERD is betteer then we will continue PPI. Otherwise, will consider neurontin for habit cough Will get records from DR Jon White     Patient Instructions: 1)  continue your medicines 2)  add nexium to your current regimen 3)  take it on empty stomach 4)  follow GERD diet advice 5)  Call me in 1 month to see if cough even better 6)  Depending on your call I will advise you next step   OV 05/18/2012  Not seen in > 2 years. He has been referred back for new problem of PE. IN November 18, 2011 one week after long car trip to [place] he was helping someone out with some heavy lifting at camp site and then became acutely unwell. Rushed to [facility] ER. Where per hx andreview of recoords: He was diagnosed with DVT left popliteal veint and RLL Sup Segment PE on CT June 201, 2013. Other tests showed: trop < 0.04, ddimer 5.6,  ECHO 11/19/11 - Normal LVEF. Mild TR, Mild PHA. Mild LVH. Rx as PE for 3-4 months with coumadin. Currently dyspneic ever since PE - simple activitis make him dyspneic. Gained 8#. Also 6 days in hospital. Walking desaturation test on 05/18/2012 185 feet x 3 laps:  did NOT desaturate. Rest pulse ox was 96%, final pulse ox was 91%. HR response 71/min at rest to 89/min at peak exertion.  Of note, CT June 2013 in [place] per their report (image n/a)  was significant for other findings that include: a) emphysema; b)   abnormal esophagus, hiatal hernia - rec endoscopy, and c) RML and R Anterior Base infiltrates - rad recommending followup   REC In order to sort out your shortness of breath have CT angio chest, echo and PFT  REturn for followup after above  Will refer you to coumadin clinic  OV 06/12/12 Presents with wife. Here to review test results. He is now back on coumadin with INR > 2. Still no change in dyspnea. This is the dyspnea that got worse after the PE diagnosis. Reports no new complaints but main issue is persistent dyspnea class 2-3 along  with cough and a severe inability / struggle to bring out sputumwhich is scanty and yellopw. This has made voice hoarse past few weeks. Otherwise all same and he is frustrated. Reviewed meds: compliant with symbicort. He is not on spiriva; tried it bfore but quit due to urinary incontinence. He feels nebs would be better choice for him but willing to try tudorza    CT shows a) no clot but b) has small node in chest and emphysema - all old and unchnaged. However, has 2 mm stone in upper pole left kidney with minimal left renal collecting system dilatation noted; This is called hydronephrosis and he has contacted his urologist about it.   ECHO 1224/13  - PA pressure could not be estimated but essentially nromal Echo so PAP thought to be normal  PFT  06/12/12 PFT FVC fev1 ratio BD fev1 TLC DLCO coments  06/12/2012  2.7L/70% 1.1L/42% 39(67) 38% 6.8L/114% 9.7/56% Copd with asthma component. Huge BD response  07/10/2012   3.8 L/92%   1.97 L/64%   52      almost 1 L improvement since starting  tudorza    Past, Family, Social reviewed: no change since last visit  REC The reasoon you got more short of breath after blood clot treatment is not because of blood clot issues but becasuse asthma/copd is acting up significantly  Take doxycycline 100mg  po twice daily x 5 days; take after meals and avoid sunlight  Please take Take prednisone 40mg  once daily x 3 days, then 30mg  once daily x 3 days, then 20mg  once daily x 3 days, then prednisone 10mg  once daily x 3 days and stop  Continue symbicort 2 puff twice daily  Try tudorza 1 puff twice a day - learn technique. Monitor for urinary retention. IF this is a problem, call us immediately and stop the drug  Call me in 2 weeks to report response  I will see you in 4 weeks - 6 weeks to see progress  IF above strategy does not work , we can try BREO mdi or nebs  At followup do spirometry    OV 07/10/2012 - Jon White returns for followup. After startinng  Carlos American he feels 80% better. On his spirometry his FEV1 has improved almost a liter from 1.1 L to 1.97 L/64%. He continue Symbicort. He still has some residual mucus production that is blotchy postnasal drip and yellow color early in the morning. He uses his nasal steroids diligently but does not use nasal saline spray regularly. He never used Nettie pot or hypertonic saline spray   - There no new issues and for the spring 2014 he plans to go to [place] as part of his annual spring to summer visit  PFT  06/12/12 PFT FVC fev1 ratio BD fev1 TLC DLCO coments  06/12/2012  2.7L/70% 1.1L/42% 39(67) 38% 6.8L/114% 9.7/56% Copd with asthma component. Huge BD response  07/10/2012   3.8 L/92%   1.97 L/64%   52      almost 1 L improvement since starting  tudorza    #COPD  Continue symbicort 2 puff twice daily  tudorza 1 puff twice a day  Use albuterol as needed  I recommend you take N acetylcysteine 600 mg twice a day.  - There is research from [place] published in [place] journal that shows if you takes N-acetylcysteine cuts down acute exacerbation COPD by 20%. The drug functions by being an anti-oxidant. This drug will not make Jon White feel any better but will make the chances of this having an exacerbation less likely The drug is cheap and is over-the-counter at [facility] vitamin store or [employer]. There are no side effects. I have experience with with the drug with pulmonary fibrosis patients so I recommend it strongly  REturn in October 2014 or sooner if needed  Ensure fu in [place] during summer 2014  #Sinus draingae  - use netti pot or 3% neill-med saline spray daily at night for nose  - continue other measures advised by Dr Suszanne Conners  #Folllowup  - Oct 2014     OV 03/19/2013  COPD/asthma,, pulmonary embolism, chronic rhinosinusitis followup  He is back from [place] for the winter. He has been doing well. He developed urinary retention again with the second anti-cholinergic  inhaler namely tud orza. So he is discontinuing himself on Symbicort. No deterioration in respiratory symptoms. He's had a flu shot. Overall COPD stable and cat Score is 14  In terms of chronic rhinosinusitis this is unchanged. He is not fully compliant with his saline and nasal steroids.   Labs: for pulm embolism he continues on Coumadin. Last checked in June 2014 at our Coumadin clinic INR was therapeutic. He is been checking it in [place] and according to his history it has been therapeutic. He plans to re\re register with our Coumadin clinic  CAT COPD Symptom & Quality of Life Score ([employer] trademark) 0 is no burden. 5 is highest burden 03/19/2013   Never Cough -> Cough all the time 2  No phlegm in chest -> Chest is full of phlegm 4  No chest tightness -> Chest feels very tight 2  No dyspnea for 1 flight stairs/hill -> Very dyspneic for 1 flight of stairs 2  No limitations for ADL at home -> Very limited with ADL at home 1  Confident leaving home -> Not at all confident leaving home 0  Sleep soundly -> Do not sleep soundly because of lung condition 1  Lots of Energy -> No energy at all 2  TOTAL Score (max 40)  14      Review of Systems  Constitutional: Negative for fever and unexpected weight change.  HENT: Negative for congestion, dental problem, ear pain, nosebleeds, postnasal drip, rhinorrhea, sinus pressure, sneezing, sore throat and trouble swallowing.   Eyes: Negative for redness and itching.  Respiratory: Negative for cough, chest tightness, shortness of breath and wheezing.   Cardiovascular: Negative for palpitations and leg swelling.  Gastrointestinal: Negative for nausea and vomiting.  Genitourinary: Negative for dysuria.  Musculoskeletal: Negative for joint swelling.  Skin: Negative for rash.  Neurological: Negative for headaches.  Hematological: Does not bruise/bleed easily.  Psychiatric/Behavioral: Negative for dysphoric mood. The patient is not nervous/anxious.  Objective:   Physical Exam General:  well developed, well nourished, in no acute distress Head:  normocephalic and atraumatic Eyes:  PERRLA/EOM intact; conjunctiva and sclera clear Ears:  TMs intact and clear with normal canals Nose:  no deformity, discharge, inflammation, or lesions Mouth:  dentures Melampatti Class II.   Neck:  no masses, thyromegaly, or abnormal cervical nodes Chest Wall:  no deformities noted Lungs: No wheezes Heart:  regular rate and rhythm, S1, S2 without murmurs, rubs, gallops, or clicks Abdomen:  bowel sounds positive; abdomen soft and non-tender without masses, or organomegaly Msk:  no deformity or scoliosis noted with normal posture Pulses:  pulses normal Extremities:  no clubbing, cyanosis, edema, or deformity noted Neurologic:  CN II-XII grossly intact with normal reflexes, coordination, muscle strength and tone Skin:  intact without lesions or rashes Cervical Nodes:  no significant adenopathy Axillary Nodes:  no significant adenopathy Psych:  alert and cooperative; normal mood and affect; normal attention span and concentration         Assessment & Plan:

## 2013-03-19 NOTE — Patient Instructions (Addendum)
#  COPD/asthma - too bad you had urinary retention with tudorza, CMA Wil mark it as allergy - STop symbicort  - Try ANORO 1 puff once daily, learn technique - Add QVAR 2 puff, twice daily -Use albuterol as needed -I recommend you  take N acetylcysteine 600 mg twice a day.  - glad you had flu shot already   #Sinus draingae  - use netti pot or 3% neill-med saline spray daily at night for nose - continue other measures advised by Dr Suszanne Conners - you need to be regular about this  #Pulmonray embolism  - continue coumadin per coumadin clinikc - you need to re-register with them - at followup will need to establish stop date plan but my inclination is 2-4 years full dose  #Folllowup  - 3 weeks with NP with spirometry at followup to see response to new regimen and to monitor for urinary retentions

## 2013-03-21 DIAGNOSIS — J310 Chronic rhinitis: Secondary | ICD-10-CM | POA: Insufficient documentation

## 2013-03-21 DIAGNOSIS — J329 Chronic sinusitis, unspecified: Secondary | ICD-10-CM | POA: Insufficient documentation

## 2013-03-21 NOTE — Assessment & Plan Note (Signed)
#  Sinus draingae  - use netti pot or 3% neill-med saline spray daily at night for nose - continue other measures advised by Dr Suszanne Conners - you need to be regular about this

## 2013-03-21 NOTE — Assessment & Plan Note (Signed)
#  Pulmonray embolism  - continue coumadin per coumadin clinikc - you need to re-register with them - at followup will need to establish stop date plan but my inclination is 2-4 years full dose

## 2013-03-21 NOTE — Assessment & Plan Note (Addendum)
#  COPD/asthma - too bad you had urinary retention with tudorza, CMA Wil mark it as allergy - STop symbicort  - Try ANORO 1 puff once daily, learn technique - Add QVAR 2 puff, twice daily -Use albuterol as needed   #Folllowup  - 3 weeks with NP with spirometry at followup to see response to new regimen and to monitor for urinary retentions  -I recommend you  take N acetylcysteine 600 mg twice a day.  - glad you had flu shot already

## 2013-03-27 ENCOUNTER — Ambulatory Visit (INDEPENDENT_AMBULATORY_CARE_PROVIDER_SITE_OTHER): Payer: Medicare Other | Admitting: General Practice

## 2013-03-27 DIAGNOSIS — I2699 Other pulmonary embolism without acute cor pulmonale: Secondary | ICD-10-CM | POA: Diagnosis not present

## 2013-03-27 DIAGNOSIS — Z7901 Long term (current) use of anticoagulants: Secondary | ICD-10-CM

## 2013-04-06 ENCOUNTER — Telehealth: Payer: Self-pay | Admitting: Internal Medicine

## 2013-04-06 MED ORDER — BECLOMETHASONE DIPROPIONATE 80 MCG/ACT IN AERS: 2.0000 | INHALATION_SPRAY | Freq: Two times a day (BID) | RESPIRATORY_TRACT | Status: DC

## 2013-04-06 NOTE — Telephone Encounter (Signed)
Pt is calling back to check on status of the QVar Rx.  Pt is completely out of inhalers & needs something done TODAY, please.  Antionette Fairy

## 2013-04-06 NOTE — Telephone Encounter (Signed)
I spoke with pt. Aware rx has been called in. Nothing further needed

## 2013-04-09 ENCOUNTER — Encounter: Payer: Self-pay | Admitting: Adult Health

## 2013-04-09 ENCOUNTER — Ambulatory Visit (INDEPENDENT_AMBULATORY_CARE_PROVIDER_SITE_OTHER): Payer: Medicare Other | Admitting: Adult Health

## 2013-04-09 VITALS — BP 134/86 | HR 83 | Temp 98.3°F | Ht 68.0 in | Wt 171.2 lb

## 2013-04-09 DIAGNOSIS — R0602 Shortness of breath: Secondary | ICD-10-CM

## 2013-04-09 DIAGNOSIS — J449 Chronic obstructive pulmonary disease, unspecified: Secondary | ICD-10-CM

## 2013-04-09 NOTE — Patient Instructions (Signed)
#  COPD/asthma Continue on ANORO 1 puff once daily Continue on QVAR 2 puff, twice daily Use albuterol as needed follow up Dr. Marchelle Gearing in 3 months and As needed

## 2013-04-09 NOTE — Assessment & Plan Note (Signed)
Compensated on present regimen  No urinary retention on ANORO  Spirometry with minimal change  Plan  COPD/asthma Continue on ANORO 1 puff once daily Continue on QVAR 2 puff, twice daily Use albuterol as needed follow up Dr. Marchelle Gearing in 3 months and As needed

## 2013-04-09 NOTE — Progress Notes (Signed)
Subjective:    Patient ID: Jon White, male    DOB: 03/12/1933, 80 .o.   MRN: [id number]  HPI Visit Type:  Follow-up Copy to:  Dr. Jolene Provost Primary Provider/Referring Provider:  Dr Georgianne Fick PMD, DR [facility] ENT, Dr. Arlyce Dice GI, Dr Norco Callas - Allergy   OV April 2011:  Fu copd/asthma: chronic cough and cough variant asthma.,  Last visits March and November 2010. Since then has seen Dr. Lazarus Salines who is contemplating sinus surgery if medical Rx fails. He has continued nasal steroid ciclesonide, daily saline wash via nettpot. However, he did see Dr. Cromwell Callas for allergy eval. Reportedly skin testing mildly positive for mold but otherwise negative. Was started on singulair. Afer that sinus drainage is "90% better". Able to cope with current levels of sinus drainage. Using tissue only 4-5 times per day as oppsed to >10-20 times per day. Re: GERD. He is not on PPI. Still having some cough and tickle in throat after he eats. Wants to get rid of it. Does not think this element of cough is related to sinus drainage. No other complaints. No dyspnea. Contnues with symbicort. Due to go back to [facility] for 5 months starting October 29, 2009  Problem # 1:  COUGH (ICD-786.2)   Etiologys a) elated to sinus drainage and mucus; b) Silent GERD  (he has hiatal hernia & esophagitis on ct chest 02/2008 + hx of hiccups + hx of intermittent dysphagia -.sp eso dilataition 07/2008; and c)  Cough variant asthma coexisting based on recurrence of chest tightness after dc of symbicort 07/2008  > On 09/10/2009: improved control after Dr. Rudy Callas added singulair to regimen in Mid -feb 2011. Stil lhaving some cough that is likely due to GERD versus Habit cough. He is not on PPI and wonder why. He is not concerned about aspirin as cause and is continuing it  plan Sinus per Dr Lazarus Salines nad Dr.  Callas GI per Dr Arlyce Dice but will add PPI nexium Cough Variant asthma - contnue symbicort He will call in 1 month. If element of  cough that is thought as due to GERD is betteer then we will continue PPI. Otherwise, will consider neurontin for habit cough Will get records from DR sharma     Patient Instructions: 1)  continue your medicines 2)  add nexium to your current regimen 3)  take it on empty stomach 4)  follow GERD diet advice 5)  Call me in 1 month to see if cough even better 6)  Depending on your call I will advise you next step   OV 05/18/2012  Not seen in > 2 years. He has been referred back for new problem of PE. IN November 18, 2011 one week after long car trip to [place] he was helping someone out with some heavy lifting at camp site and then became acutely unwell. Rushed to [facility] ER. Where per hx andreview of recoords: He was diagnosed with DVT left popliteal veint and RLL Sup Segment PE on CT June 201, 2013. Other tests showed: trop < 0.04, ddimer 5.6,  ECHO 11/19/11 - Normal LVEF. Mild TR, Mild PHA. Mild LVH. Rx as PE for 3-4 months with coumadin. Currently dyspneic ever since PE - simple activitis make him dyspneic. Gained 8#. Also 6 days in hospital. Walking desaturation test on 05/18/2012 185 feet x 3 laps:  did NOT desaturate. Rest pulse ox was 96%, final pulse ox was 91%. HR response 71/min at rest to 89/min at peak exertion.  Of note, [facility] June 2013 [facility] per their report (image n/a)  was significant for other findings that include: a) emphysema; b)   abnormal esophagus, hiatal hernia - rec endoscopy, and c) RML and R Anterior Base infiltrates - rad recommending followup   REC In order to sort out your shortness of breath have CT angio chest, echo and PFT  REturn for followup after above  Will refer you to coumadin clinic  OV 06/12/12 Presents with wife. Here to review test results. He is now back on coumadin with INR > 2. Still no change in dyspnea. This is the dyspnea that got worse after the PE diagnosis. Reports no new complaints but main issue is persistent dyspnea class 2-3 along  with cough and a severe inability / struggle to bring out sputumwhich is scanty and yellopw. This has made voice hoarse past few weeks. Otherwise all same and he is frustrated. Reviewed meds: compliant with symbicort. He is not on spiriva; tried it bfore but quit due to urinary incontinence. He feels nebs would be better choice for him but willing to try tudorza    CT shows a) no clot but b) has small node in chest and emphysema - all old and unchnaged. However, has 2 mm stone in upper pole left kidney with minimal left renal collecting system dilatation noted; This is called hydronephrosis and he has contacted his urologist about it.   ECHO 1224/13  - PA pressure could not be estimated but essentially nromal Echo so PAP thought to be normal  PFT  06/12/12 PFT FVC fev1 ratio BD fev1 TLC DLCO coments  06/12/2012  2.7L/70% 1.1L/42% 39(67) 38% 6.8L/114% 9.7/56% Copd with asthma component. Huge BD response  07/10/2012   3.8 L/92%   1.97 L/64%   52      almost 1 L improvement since starting  tudorza    Past, Family, Social reviewed: no change since last visit  REC The reasoon you got more short of breath after blood clot treatment is not because of blood clot issues but becasuse asthma/copd is acting up significantly  Take doxycycline 100mg  po twice daily x 5 days; take after meals and avoid sunlight  Please take Take prednisone 40mg  once daily x 3 days, then 30mg  once daily x 3 days, then 20mg  once daily x 3 days, then prednisone 10mg  once daily x 3 days and stop  Continue symbicort 2 puff twice daily  Try tudorza 1 puff twice a day - learn technique. Monitor for urinary retention. IF this is a problem, call us immediately and stop the drug  Call me in 2 weeks to report response  I will see you in 4 weeks - 6 weeks to see progress  IF above strategy does not work , we can try BREO mdi or nebs  At followup do spirometry    OV 07/10/2012 - [name] returns for followup. After startinng  Carlos [employer] he feels 80% better. On his spirometry his FEV1 has improved almost a liter from 1.1 L to 1.97 L/64%. He continue Symbicort. He still has some residual mucus production that is blotchy postnasal drip and yellow color early in the morning. He uses his nasal steroids diligently but does not use nasal saline spray regularly. He never used Nettie pot or hypertonic saline spray   - There no new issues and for the spring 2014 he plans to go to [place] as part of his annual spring to summer visit  PFT  06/12/12 PFT FVC fev1 ratio BD fev1 TLC DLCO coments  06/12/2012  2.7L/70% 1.1L/42% 39(67) 38% 6.8L/114% 9.7/56% Copd with asthma component. Huge BD response  07/10/2012   3.8 L/92%   1.97 L/64%   52      almost 1 L improvement since starting  tudorza    #COPD  Continue symbicort 2 puff twice daily  tudorza 1 puff twice a day  Use albuterol as needed  I recommend you take N acetylcysteine 600 mg twice a day.  - There is research from [place] published in [employer] journal that shows if you takes N-acetylcysteine cuts down acute exacerbation COPD by 20%. The drug functions by being an anti-oxidant. This drug will not make [name] feel any better but will make the chances of this having an exacerbation less likely The drug is cheap and is over-the-counter at [facility] vitamin store or [employer]. There are no side effects. I have experience with with the drug with pulmonary fibrosis patients so I recommend it strongly  REturn in October 2014 or sooner if needed  Ensure fu in [place] during summer 2014  #Sinus draingae  - use netti pot or 3% neill-med saline spray daily at night for nose  - continue other measures advised by Dr Suszanne Conners  #Folllowup  - Oct 2014     OV 03/19/2013  COPD/asthma,, pulmonary embolism, chronic rhinosinusitis followup  He is back from [place] for the winter. He has been doing well. He developed urinary retention again with the second anti-cholinergic  inhaler namely tud orza. So he is discontinuing himself on Symbicort. No deterioration in respiratory symptoms. He's had a flu shot. Overall COPD stable and cat Score is 14  In terms of chronic rhinosinusitis this is unchanged. He is not fully compliant with his saline and nasal steroids.   Labs: for pulm embolism he continues on Coumadin. Last checked in June 2014 at our Coumadin clinic INR was therapeutic. He is been checking it in [place] and according to his history it has been therapeutic. He plans to re\re register with our Coumadin clinic  CAT COPD Symptom & Quality of Life Score ([employer] trademark) 0 is no burden. 5 is highest burden 03/19/2013   Never Cough -> Cough all the time 2  No phlegm in chest -> Chest is full of phlegm 4  No chest tightness -> Chest feels very tight 2  No dyspnea for 1 flight stairs/hill -> Very dyspneic for 1 flight of stairs 2  No limitations for ADL at home -> Very limited with ADL at home 1  Confident leaving home -> Not at all confident leaving home 0  Sleep soundly -> Do not sleep soundly because of lung condition 1  Lots of Energy -> No energy at all 2  TOTAL Score (max 40)  14    04/09/2013 Follow up for COPD  Patient returns for a followup for COPD. Last visit. Patient was changed from Advair to [facility] and QVAR due to urinary retention on Tudorza  Patient says that he has felt improved with decreased shortness of breath. No flare in cough or shortness of breath. Patient denies any chest pain, orthopnea, PND, or leg swelling.  Patient denies any urinary retention or urinary urgency.  spirometry today shows FEV1 at  1.7 liters, 56% , ratio 52%     Review of Systems  Constitutional: Negative for fever and unexpected weight change.  HENT: Negative for congestion, dental problem, ear pain, nosebleeds, postnasal drip,  rhinorrhea, sinus pressure, sneezing, sore throat and trouble swallowing.   Eyes: Negative for redness and itching.  Respiratory:  Negative for cough, chest tightness, shortness of breath and wheezing.   Cardiovascular: Negative for palpitations and leg swelling.  Gastrointestinal: Negative for nausea and vomiting.  Genitourinary: Negative for dysuria.  Musculoskeletal: Negative for joint swelling.  Skin: Negative for rash.  Neurological: Negative for headaches.  Hematological: Does not bruise/bleed easily.  Psychiatric/Behavioral: Negative for dysphoric mood. The patient is not nervous/anxious.        Objective:   Physical Exam General:  well developed, well nourished, in no acute distress Head:  normocephalic and atraumatic Eyes:  PERRLA/EOM intact; conjunctiva and sclera clear Ears:  TMs intact and clear with normal canals Nose:  no deformity, discharge, inflammation, or lesions Mouth:  dentures Melampatti Class II.   Neck:  no masses, thyromegaly, or abnormal cervical nodes Chest Wall:  no deformities noted Lungs: No wheezes Heart:  regular rate and rhythm, S1, S2 without murmurs, rubs, gallops, or clicks Abdomen:  bowel sounds positive; abdomen soft and non-tender without masses, or organomegaly Msk:  no deformity or scoliosis noted with normal posture Pulses:  pulses normal Extremities:  no clubbing, cyanosis, edema, or deformity noted Neurologic:  CN II-XII grossly intact with normal reflexes, coordination, muscle strength and tone Skin:  intact without lesions or rashes Cervical Nodes:  no significant adenopathy Axillary Nodes:  no significant adenopathy Psych:  alert and cooperative; normal mood and affect; normal attention span and concentration         Assessment & Plan:

## 2013-04-10 ENCOUNTER — Telehealth: Payer: Self-pay | Admitting: Adult Health

## 2013-04-10 DIAGNOSIS — J330 Polyp of nasal cavity: Secondary | ICD-10-CM | POA: Diagnosis not present

## 2013-04-10 DIAGNOSIS — J310 Chronic rhinitis: Secondary | ICD-10-CM | POA: Diagnosis not present

## 2013-04-10 MED ORDER — UMECLIDINIUM-VILANTEROL 62.5-25 MCG/INH IN AEPB: 1.0000 | INHALATION_SPRAY | Freq: Every day | RESPIRATORY_TRACT | Status: DC

## 2013-04-10 MED ORDER — BECLOMETHASONE DIPROPIONATE 80 MCG/ACT IN AERS: 2.0000 | INHALATION_SPRAY | Freq: Two times a day (BID) | RESPIRATORY_TRACT | Status: DC

## 2013-04-10 NOTE — Telephone Encounter (Signed)
Spoke with pt and advised that rx for Anoro and Qvar were sent to pharmacy.

## 2013-04-24 ENCOUNTER — Ambulatory Visit (INDEPENDENT_AMBULATORY_CARE_PROVIDER_SITE_OTHER): Payer: Medicare Other | Admitting: General Practice

## 2013-04-24 DIAGNOSIS — I2699 Other pulmonary embolism without acute cor pulmonale: Secondary | ICD-10-CM

## 2013-04-24 DIAGNOSIS — Z7901 Long term (current) use of anticoagulants: Secondary | ICD-10-CM

## 2013-04-24 LAB — POCT INR: INR: 1.5

## 2013-04-24 NOTE — Progress Notes (Signed)
Pre-visit discussion using our clinic review tool. No additional management support is needed unless otherwise documented below in the visit note.  

## 2013-05-04 ENCOUNTER — Ambulatory Visit (INDEPENDENT_AMBULATORY_CARE_PROVIDER_SITE_OTHER): Payer: Medicare Other | Admitting: General Practice

## 2013-05-04 DIAGNOSIS — Z7901 Long term (current) use of anticoagulants: Secondary | ICD-10-CM

## 2013-05-04 DIAGNOSIS — I2699 Other pulmonary embolism without acute cor pulmonale: Secondary | ICD-10-CM

## 2013-05-04 LAB — POCT INR: INR: 2.3

## 2013-05-04 NOTE — Progress Notes (Signed)
Pre-visit discussion using our clinic review tool. No additional management support is needed unless otherwise documented below in the visit note.  

## 2013-05-15 DIAGNOSIS — J45909 Unspecified asthma, uncomplicated: Secondary | ICD-10-CM | POA: Diagnosis not present

## 2013-05-15 DIAGNOSIS — R21 Rash and other nonspecific skin eruption: Secondary | ICD-10-CM | POA: Diagnosis not present

## 2013-05-15 DIAGNOSIS — L259 Unspecified contact dermatitis, unspecified cause: Secondary | ICD-10-CM | POA: Diagnosis not present

## 2013-05-15 DIAGNOSIS — J3089 Other allergic rhinitis: Secondary | ICD-10-CM | POA: Diagnosis not present

## 2013-05-22 ENCOUNTER — Telehealth: Payer: Self-pay | Admitting: Internal Medicine

## 2013-05-22 NOTE — Telephone Encounter (Signed)
Feno at Dr Gary Fleet office 05/15/13 was 16 and low prob for eos asthma  Dr. Kalman Shan, M.D., [facility].C.P Pulmonary and Critical Care Medicine Staff Physician [facility] [place] [facility] Pager: [phone], If no answer or between  15:00h - 7:00h: call [phone]  05/22/2013 9:24 PM

## 2013-06-01 ENCOUNTER — Ambulatory Visit (INDEPENDENT_AMBULATORY_CARE_PROVIDER_SITE_OTHER): Payer: Medicare Other | Admitting: General Practice

## 2013-06-01 DIAGNOSIS — Z7901 Long term (current) use of anticoagulants: Secondary | ICD-10-CM

## 2013-06-01 DIAGNOSIS — I2699 Other pulmonary embolism without acute cor pulmonale: Secondary | ICD-10-CM

## 2013-06-01 LAB — POCT INR: INR: 2.8

## 2013-06-01 NOTE — Progress Notes (Signed)
Pre-visit discussion using our clinic review tool. No additional management support is needed unless otherwise documented below in the visit note.  

## 2013-06-11 ENCOUNTER — Other Ambulatory Visit: Payer: Self-pay | Admitting: Adult Health

## 2013-06-11 MED ORDER — UMECLIDINIUM-VILANTEROL 62.5-25 MCG/INH IN AEPB: 1.0000 | INHALATION_SPRAY | Freq: Every day | RESPIRATORY_TRACT | Status: DC

## 2013-06-11 NOTE — Telephone Encounter (Signed)
Received faxed refill request from [facility] for [employer] from pharmacy states : "patient is requesting this be changed to the inhaler with 30 inhalations, as this only only provides 7 inhalations.  Please advise and fax back with approval, directions, etc.  Thank you!"  Per pt's chart, he has called b/c rx had not reached the pharmacy from the 11.10.14 ov w/ TP It appears that the sample quantity of the Anoro was sent in error (#14) Rx clarification faxed back to [facility] and med list updated  Will sign off.

## 2013-07-02 ENCOUNTER — Ambulatory Visit (INDEPENDENT_AMBULATORY_CARE_PROVIDER_SITE_OTHER): Payer: Medicare Other | Admitting: General Practice

## 2013-07-02 DIAGNOSIS — Z5181 Encounter for therapeutic drug level monitoring: Secondary | ICD-10-CM

## 2013-07-02 DIAGNOSIS — I2699 Other pulmonary embolism without acute cor pulmonale: Secondary | ICD-10-CM | POA: Diagnosis not present

## 2013-07-02 DIAGNOSIS — Z7901 Long term (current) use of anticoagulants: Secondary | ICD-10-CM | POA: Diagnosis not present

## 2013-07-02 LAB — POCT INR: INR: 2.9

## 2013-07-02 NOTE — Progress Notes (Signed)
Pre-visit discussion using our clinic review tool. No additional management support is needed unless otherwise documented below in the visit note.  

## 2013-07-09 ENCOUNTER — Ambulatory Visit (INDEPENDENT_AMBULATORY_CARE_PROVIDER_SITE_OTHER): Payer: Medicare Other | Admitting: Internal Medicine

## 2013-07-09 ENCOUNTER — Encounter: Payer: Self-pay | Admitting: Internal Medicine

## 2013-07-09 VITALS — BP 120/72 | HR 80 | Ht 68.0 in | Wt 170.4 lb

## 2013-07-09 DIAGNOSIS — J449 Chronic obstructive pulmonary disease, unspecified: Secondary | ICD-10-CM | POA: Diagnosis not present

## 2013-07-09 DIAGNOSIS — I2699 Other pulmonary embolism without acute cor pulmonale: Secondary | ICD-10-CM | POA: Diagnosis not present

## 2013-07-09 NOTE — Progress Notes (Signed)
Subjective:    Patient ID: JULEN TU, male    DOB: 06-Nov-1932, 80 y.o.   MRN: [id number]  HPI  isit Type:  Follow-up Copy to:  Dr. Emilee Hero Primary Provider/Referring Provider:  Dr Merrilee Seashore PMD, DR [facility] ENT, Dr. Deatra Ina GI, Dr Donneta Romberg - Allergy   reports that he quit smoking about 23 months ago. His smoking use included Cigarettes. He has a 60 pack-year smoking history. He has never used smokeless tobacco.   OV April 2011:  Fu copd/asthma: chronic cough and cough variant asthma.,  Last visits March and November 2010. Since then has seen Dr. Erik Obey who is contemplating sinus surgery if medical Rx fails. He has continued nasal steroid ciclesonide, daily saline wash via nettpot. However, he did see Dr. Donneta Romberg for allergy eval. Reportedly skin testing mildly positive for mold but otherwise negative. Was started on singulair. Afer that sinus drainage is "90% better". Able to cope with current levels of sinus drainage. Using tissue only 4-5 times per day as oppsed to >10-20 times per day. Re: GERD. He is not on PPI. Still having some cough and tickle in throat after he eats. Wants to get rid of it. Does not think this element of cough is related to sinus drainage. No other complaints. No dyspnea. Contnues with symbicort. Due to go back to [facility] for 5 months starting October 29, 2009  Problem # 1:  COUGH (ICD-786.2)   Etiologys a) elated to sinus drainage and mucus; b) Silent GERD  (he has hiatal hernia & esophagitis on ct chest 02/2008 + hx of hiccups + hx of intermittent dysphagia -.sp eso dilataition 07/2008; and c)  Cough variant asthma coexisting based on recurrence of chest tightness after dc of symbicort 07/2008  > On 09/10/2009: improved control after Dr. Donneta Romberg added singulair to regimen in Mid -feb 2011. Stil lhaving some cough that is likely due to GERD versus Habit cough. He is not on PPI and wonder why. He is not concerned about aspirin as cause and is continuing  it  plan Sinus per Dr Erik Obey nad Dr. Donneta Romberg GI per Dr Deatra Ina but will add PPI nexium Cough Variant asthma - contnue symbicort He will call in 1 month. If element of cough that is thought as due to GERD is betteer then we will continue PPI. Otherwise, will consider neurontin for habit cough Will get records from DR sharma     Patient Instructions: 1)  continue your medicines 2)  add nexium to your current regimen 3)  take it on empty stomach 4)  follow GERD diet advice 5)  Call me in 1 month to see if cough even better 6)  Depending on your call I will advise you next step   OV 05/18/2012  Not seen in > 2 years. He has been referred back for new problem of PE. IN November 18, 2011 one week after long car trip to [place] he was helping someone out with some heavy lifting at camp site and then became acutely unwell. Rushed to [facility] ER. Where per hx andreview of recoords: He was diagnosed with DVT left popliteal veint and RLL Sup Segment PE on CT June 201, 2013. Other tests showed: trop < 0.04, ddimer 5.6,  ECHO 11/19/11 - Normal LVEF. Mild TR, Mild PHA. Mild LVH. Rx as PE for 3-4 months with coumadin. Currently dyspneic ever since PE - simple activitis make him dyspneic. Gained 8#. Also 6 days in hospital. Walking desaturation test  on 05/18/2012 185 feet x 3 laps:  did NOT desaturate. Rest pulse ox was 96%, final pulse ox was 91%. HR response 71/min at rest to 89/min at peak exertion.   Of note, CT June 2013 in [facility] per their report (image n/a)  was significant for other findings that include: a) emphysema; b)   abnormal esophagus, hiatal hernia - rec endoscopy, and c) RML and R Anterior Base infiltrates - rad recommending followup   REC In order to sort out your shortness of breath have CT angio chest, echo and PFT  REturn for followup after above  Will refer you to coumadin clinic  OV 06/12/12 Presents with wife. Here to review test results. He is now back on coumadin with INR >  2. Still no change in dyspnea. This is the dyspnea that got worse after the PE diagnosis. Reports no new complaints but main issue is persistent dyspnea class 2-3 along with cough and a severe inability / struggle to bring out sputumwhich is scanty and yellopw. This has made voice hoarse past few weeks. Otherwise all same and he is frustrated. Reviewed meds: compliant with symbicort. He is not on spiriva; tried it bfore but quit due to urinary incontinence. He feels nebs would be better choice for him but willing to try tudorza    CT shows a) no clot but b) has small node in chest and emphysema - all old and unchnaged. However, has 2 mm stone in upper pole left kidney with minimal left renal collecting system dilatation noted; This is called hydronephrosis and he has contacted his urologist about it.   ECHO 1224/13  - PA pressure could not be estimated but essentially nromal Echo so PAP thought to be normal  PFT  06/12/12 PFT FVC fev1 ratio BD fev1 TLC DLCO coments  06/12/2012  2.7L/70% 1.1L/42% 39(67) 38% 6.8L/114% 9.7/56% Copd with asthma component. Huge BD response  07/10/2012   3.8 L/92%   1.97 L/64%   52      almost 1 L improvement since starting  tudorza    Past, Family, Social reviewed: no change since last visit  REC The reasoon you got more short of breath after blood clot treatment is not because of blood clot issues but becasuse asthma/copd is acting up significantly  Take doxycycline 100mg  po twice daily x 5 days; take after meals and avoid sunlight  Please take Take prednisone 40mg  once daily x 3 days, then 30mg  once daily x 3 days, then 20mg  once daily x 3 days, then prednisone 10mg  once daily x 3 days and stop  Continue symbicort 2 puff twice daily  Try tudorza 1 puff twice a day - learn technique. Monitor for urinary retention. IF this is a problem, call us immediately and stop the drug  Call me in 2 weeks to report response  I will see you in 4 weeks - 6 weeks to see progress   IF above strategy does not work , we can try BREO mdi or nebs  At followup do spirometry    OV 07/10/2012 - [name] returns for followup. After startinng [name] he feels 80% better. On his spirometry his FEV1 has improved almost a liter from 1.1 L to 1.97 L/64%. He continue Symbicort. He still has some residual mucus production that is blotchy postnasal drip and yellow color early in the morning. He uses his nasal steroids diligently but does not use nasal saline spray regularly. He never used Nettie pot or hypertonic  saline spray   - There no new issues and for the spring 2014 he plans to go to [place] as part of his annual spring to summer visit  PFT  06/12/12 PFT FVC fev1 ratio BD fev1 TLC DLCO coments  06/12/2012  2.7L/70% 1.1L/42% 39(67) 38% 6.8L/114% 9.7/56% Copd with asthma component. Huge BD response  07/10/2012   3.8 L/92%   1.97 L/64%   52      almost 1 L improvement since starting  tudorza    #COPD  Continue symbicort 2 puff twice daily  tudorza 1 puff twice a day  Use albuterol as needed  I recommend you take N acetylcysteine 600 mg twice a day.  - There is research from [place] published in [place] that shows if you takes N-acetylcysteine cuts down acute exacerbation COPD by 20%. The drug functions by being an anti-oxidant. This drug will not make [name] feel any better but will make the chances of this having an exacerbation less likely The drug is cheap and is over-the-counter at [facility] vitamin store or [employer]. There are no side effects. I have experience with with the drug with pulmonary fibrosis patients so I recommend it strongly  REturn in October 2014 or sooner if needed  Ensure fu in [place] during summer 2014  #Sinus draingae  - use netti pot or 3% neill-med saline spray daily at night for nose  - continue other measures advised by Dr Benjamine Mola  #Folllowup  - Oct 2014     OV 03/19/2013  COPD/asthma,, pulmonary embolism,  chronic rhinosinusitis followup  He is back from [place] for the winter. He has been doing well. He developed urinary retention again with the second anti-cholinergic inhaler namely tud orza. So he is discontinuing himself on Symbicort. No deterioration in respiratory symptoms. He's had a flu shot. Overall COPD stable and cat Score is 14  In terms of chronic rhinosinusitis this is unchanged. He is not fully compliant with his saline and nasal steroids.   Labs: for pulm embolism he continues on Coumadin. Last checked in June 2014 at our Coumadin clinic INR was therapeutic. He is been checking it in [place] and according to his history it has been therapeutic. He plans to re\re register with our Coumadin clinic     04/09/2013 Follow up for COPD  Patient returns for a followup for COPD. Last visit. Patient was changed from [place] to [place] due to urinary retention on Tudorza  Patient says that he has felt improved with decreased shortness of breath. No flare in cough or shortness of breath. Patient denies any chest pain, orthopnea, PND, or leg swelling.  Patient denies any urinary retention or urinary urgency.  spirometry today shows FEV1 at  1.7 liters, 56% , ratio 52%   OV 07/09/2013  Chief Complaint  Patient presents with  . COPD    follow-up. Pt denies any increase in SOB or cough at this time.     Followup COPD:: Feno at Dr Remus Blake office 05/15/13 was 73 and low prob for eos asthma. He did not tolerate  ANORO DUE to double vision and is back on [place]  and Symbicort. He feels this combination was really well for him. COPD cat score is currently 12 and his symptoms are detailed below  Pulmonary embolism: In June 2015 it'll be 2 years and she suffered pulmonary embolus and following road from [place] to [place]. He is been compliant with Coumadin. Most  recent INR 07/02/2013 is greater than 2. There no bleeding episodes. However he is frustrated taking anti-coagulation and  would like to come off. He is agreeable to taking Coumadin for INR greater than 1.5 starting June 2015 to continue to keep the risk of recurrent PE low  Chronic rhinosinusitis:: This is due to a polyp. He is followed by ENT Dr. Elgie Congo. He did have recent prednisone burst and this really helped. He says that his quality-of-life is mostly restricted because of rhinosinusitis. However he is at baseline it is not any worse  Imaging:  Last CT chest 05/19/12  - IMPRESSION:  No evidence of pulmonary embolism.  Changes of COPD and prior right lower lobe surgery.  Stable 4 mm left lower lobe nodule.  Question right renal cyst with 2 mm left kidney stone and minimal  left hydronephrosis.  Original Report Authenticated By: Lavonia Dana, M.D.     CAT COPD Symptom & Quality of Life Score ([place]) 0 is no burden. 5 is highest burden 03/19/2013  07/09/2013   Never Cough -> Cough all the time 2 2  No phlegm in chest -> Chest is full of phlegm 4 2  No chest tightness -> Chest feels very tight 2 1  No dyspnea for 1 flight stairs/hill -> Very dyspneic for 1 flight of stairs 2 2  No limitations for ADL at home -> Very limited with ADL at home 1 1  Confident leaving home -> Not at all confident leaving home 0 0  Sleep soundly -> Do not sleep soundly because of lung condition 1 1  Lots of Energy -> No energy at all 2 3  TOTAL Score (max 40)  14 12   Review of Systems  Constitutional: Negative for fever and unexpected weight change.  HENT: Negative for congestion, dental problem, ear pain, nosebleeds, postnasal drip, rhinorrhea, sinus pressure, sneezing, sore throat and trouble swallowing.   Eyes: Negative for redness and itching.  Respiratory: Negative for cough, chest tightness, shortness of breath and wheezing.   Cardiovascular: Negative for palpitations and leg swelling.  Gastrointestinal: Negative for nausea and vomiting.  Genitourinary: Negative for dysuria.  Musculoskeletal: Negative for  joint swelling.  Skin: Negative for rash.  Neurological: Negative for headaches.  Hematological: Does not bruise/bleed easily.  Psychiatric/Behavioral: Negative for dysphoric mood. The patient is not nervous/anxious.    Current outpatient prescriptions:Aclidinium Bromide (TUDORZA PRESSAIR) 400 MCG/ACT AEPB, Inhale 1 puff into the lungs 2 (two) times daily., Disp: , Rfl: ;  albuterol (VENTOLIN HFA) 108 (90 BASE) MCG/ACT inhaler, Inhale 2 puffs into the lungs every 6 (six) hours as needed. For shortness of breath, Disp: , Rfl:  atorvastatin (LIPITOR) 10 MG tablet, Take 10 mg by mouth daily. Last taken 09/01/2011 to see if he went off med. If the rash- legs & arms would resolve.  Since stopping med., rash has cleared, Disp: , Rfl: ;  budesonide-formoterol (SYMBICORT) 160-4.5 MCG/ACT inhaler, Inhale 2 puffs into the lungs 2 (two) times daily., Disp: 2 Inhaler, Rfl: 0 Calcium Carbonate-Vitamin D (CALTRATE 600+D) 600-400 MG-UNIT per tablet, Take 1 tablet by mouth 2 (two) times daily. With meals., Disp: , Rfl: ;  cetirizine (ZYRTEC) 10 MG tablet, Take 10 mg by mouth daily., Disp: , Rfl: ;  famotidine (PEPCID) 20 MG tablet, Take 1 tablet (20 mg total) by mouth 2 (two) times daily., Disp: 30 tablet, Rfl: 0;  fluocinonide cream (LIDEX) 6.71 %, Apply 1 application topically daily. To infected area., Disp: , Rfl:  fluticasone (  FLONASE) 50 MCG/ACT nasal spray, Place 2 sprays into the nose daily. Each nostril once a day., Disp: , Rfl: ;  warfarin (COUMADIN) 2 MG tablet, Take as directed by coumadin clinic., Disp: 60 tablet, Rfl: 2     Objective:   Physical Exam   eneral:  well developed, well nourished, in no acute distress Head:  normocephalic and atraumatic Eyes:  PERRLA/EOM intact; conjunctiva and sclera clear Ears:  TMs intact and clear with normal canals Nose:  no deformity, discharge, inflammation, or lesions Mouth:  dentures Melampatti Class II.   Neck:  no masses, thyromegaly, or abnormal cervical  nodes Chest Wall:  no deformities noted Lungs: No wheezes Heard:  regular rate and rhythm, S1, S2 without murmurs, rubs, gallops, or clicks Abdomen:  bowel sounds positive; abdomen soft and non-tender without masses, or organomegaly Msk:  no deformity or scoliosis noted with normal posture Pulses:  pulses normal Extremities:  no clubbing, cyanosis, edema, or deformity noted Neurologic:  CN II-XII grossly intact with normal reflexes, coordination, muscle strength and tone Skin:  intact without lesions or rashes Cervical Nodes:  no significant adenopathy Axillary Nodes:  no significant adenopathy Psych:  alert and cooperative; normal mood and affect; normal attention span and concentration       Assessment & Plan:

## 2013-07-09 NOTE — Patient Instructions (Addendum)
Continuye your medications REturn n end May or early June 2015 to discuss reducing your coumadin dose COPD CAT score at followupo AT followup discuss low Dose CT scan chest  - last CT dec 2013 and he is due for one anytime 2015

## 2013-07-10 DIAGNOSIS — H6120 Impacted cerumen, unspecified ear: Secondary | ICD-10-CM | POA: Diagnosis not present

## 2013-07-10 DIAGNOSIS — J310 Chronic rhinitis: Secondary | ICD-10-CM | POA: Diagnosis not present

## 2013-07-10 DIAGNOSIS — J330 Polyp of nasal cavity: Secondary | ICD-10-CM | POA: Diagnosis not present

## 2013-07-18 DIAGNOSIS — E782 Mixed hyperlipidemia: Secondary | ICD-10-CM | POA: Diagnosis not present

## 2013-07-18 DIAGNOSIS — Z0000 Encounter for general adult medical examination without abnormal findings: Secondary | ICD-10-CM | POA: Diagnosis not present

## 2013-07-21 NOTE — Assessment & Plan Note (Signed)
Return June 2015 ro discuss reducing target INR to> 1.5

## 2013-07-21 NOTE — Assessment & Plan Note (Signed)
strable continue meds

## 2013-08-06 ENCOUNTER — Ambulatory Visit (INDEPENDENT_AMBULATORY_CARE_PROVIDER_SITE_OTHER): Payer: Medicare Other | Admitting: General Practice

## 2013-08-06 DIAGNOSIS — I2699 Other pulmonary embolism without acute cor pulmonale: Secondary | ICD-10-CM

## 2013-08-06 DIAGNOSIS — Z7901 Long term (current) use of anticoagulants: Secondary | ICD-10-CM

## 2013-08-06 LAB — POCT INR: INR: 3.3

## 2013-08-06 NOTE — Progress Notes (Signed)
Pre visit review using our clinic review tool, if applicable. No additional management support is needed unless otherwise documented below in the visit note. 

## 2013-08-07 DIAGNOSIS — R7301 Impaired fasting glucose: Secondary | ICD-10-CM | POA: Diagnosis not present

## 2013-08-07 DIAGNOSIS — E782 Mixed hyperlipidemia: Secondary | ICD-10-CM | POA: Diagnosis not present

## 2013-08-14 DIAGNOSIS — G252 Other specified forms of tremor: Secondary | ICD-10-CM | POA: Diagnosis not present

## 2013-08-14 DIAGNOSIS — J449 Chronic obstructive pulmonary disease, unspecified: Secondary | ICD-10-CM | POA: Diagnosis not present

## 2013-08-14 DIAGNOSIS — G250 Essential tremor: Secondary | ICD-10-CM | POA: Diagnosis not present

## 2013-08-14 DIAGNOSIS — R7301 Impaired fasting glucose: Secondary | ICD-10-CM | POA: Diagnosis not present

## 2013-08-14 DIAGNOSIS — E782 Mixed hyperlipidemia: Secondary | ICD-10-CM | POA: Diagnosis not present

## 2013-08-16 ENCOUNTER — Other Ambulatory Visit: Payer: Self-pay

## 2013-08-16 DIAGNOSIS — L821 Other seborrheic keratosis: Secondary | ICD-10-CM | POA: Diagnosis not present

## 2013-08-16 DIAGNOSIS — L819 Disorder of pigmentation, unspecified: Secondary | ICD-10-CM | POA: Diagnosis not present

## 2013-08-16 DIAGNOSIS — D044 Carcinoma in situ of skin of scalp and neck: Secondary | ICD-10-CM | POA: Diagnosis not present

## 2013-08-16 DIAGNOSIS — C44319 Basal cell carcinoma of skin of other parts of face: Secondary | ICD-10-CM | POA: Diagnosis not present

## 2013-08-16 DIAGNOSIS — D485 Neoplasm of uncertain behavior of skin: Secondary | ICD-10-CM | POA: Diagnosis not present

## 2013-08-16 DIAGNOSIS — L570 Actinic keratosis: Secondary | ICD-10-CM | POA: Diagnosis not present

## 2013-08-22 ENCOUNTER — Telehealth: Payer: Self-pay | Admitting: Internal Medicine

## 2013-08-22 NOTE — Telephone Encounter (Signed)
Spoke with the pt  He states wants to know what he can take otc for pain  He hurt his wrist somehow and unsure of what to take since he is on blood thinner I advised tylenol should be fine, but will send msg to MR to see what he would prefer  Pt verbalized understanding  Please advise, thanks!

## 2013-08-23 NOTE — Telephone Encounter (Signed)
Spoke with the pt and notified of recs per MR He verbalized understanding and denied any questions  He will check with CC at next visit  Nothing further needed per pt

## 2013-08-23 NOTE — Telephone Encounter (Signed)
His coumadin clnic should be able to give him more precise advise. So , please forward to them as well   Tylenol ok but should be less than 4gm per day 0 not more than 5-7 days  Tramadol 50mg  tid prn  Is ok  Ibuprofen 400mg  tid prn but not more than 5 - 7 days is iok  Again take them all prn to minimize side effects

## 2013-08-26 ENCOUNTER — Emergency Department (HOSPITAL_COMMUNITY): Payer: Medicare Other

## 2013-08-26 ENCOUNTER — Encounter (HOSPITAL_COMMUNITY): Payer: Self-pay | Admitting: Emergency Medicine

## 2013-08-26 ENCOUNTER — Emergency Department (HOSPITAL_COMMUNITY)
Admission: EM | Admit: 2013-08-26 | Discharge: 2013-08-26 | Disposition: A | Discharge status: Home / Self Care | Payer: Medicare Other | Source: Home / Self Care | Attending: Emergency Medicine | Admitting: Emergency Medicine

## 2013-08-26 DIAGNOSIS — Z86711 Personal history of pulmonary embolism: Secondary | ICD-10-CM | POA: Insufficient documentation

## 2013-08-26 DIAGNOSIS — R109 Unspecified abdominal pain: Secondary | ICD-10-CM | POA: Diagnosis not present

## 2013-08-26 DIAGNOSIS — J438 Other emphysema: Secondary | ICD-10-CM | POA: Diagnosis not present

## 2013-08-26 DIAGNOSIS — Z9981 Dependence on supplemental oxygen: Secondary | ICD-10-CM

## 2013-08-26 DIAGNOSIS — J441 Chronic obstructive pulmonary disease with (acute) exacerbation: Secondary | ICD-10-CM

## 2013-08-26 DIAGNOSIS — R0602 Shortness of breath: Secondary | ICD-10-CM

## 2013-08-26 DIAGNOSIS — E7800 Pure hypercholesterolemia, unspecified: Secondary | ICD-10-CM | POA: Insufficient documentation

## 2013-08-26 DIAGNOSIS — Z79899 Other long term (current) drug therapy: Secondary | ICD-10-CM | POA: Insufficient documentation

## 2013-08-26 DIAGNOSIS — M1990 Unspecified osteoarthritis, unspecified site: Secondary | ICD-10-CM | POA: Insufficient documentation

## 2013-08-26 DIAGNOSIS — IMO0002 Reserved for concepts with insufficient information to code with codable children: Secondary | ICD-10-CM | POA: Insufficient documentation

## 2013-08-26 DIAGNOSIS — Z87891 Personal history of nicotine dependence: Secondary | ICD-10-CM | POA: Insufficient documentation

## 2013-08-26 DIAGNOSIS — Z8669 Personal history of other diseases of the nervous system and sense organs: Secondary | ICD-10-CM | POA: Insufficient documentation

## 2013-08-26 DIAGNOSIS — G4733 Obstructive sleep apnea (adult) (pediatric): Secondary | ICD-10-CM | POA: Insufficient documentation

## 2013-08-26 DIAGNOSIS — E782 Mixed hyperlipidemia: Secondary | ICD-10-CM | POA: Insufficient documentation

## 2013-08-26 DIAGNOSIS — J45901 Unspecified asthma with (acute) exacerbation: Secondary | ICD-10-CM

## 2013-08-26 DIAGNOSIS — Z87442 Personal history of urinary calculi: Secondary | ICD-10-CM | POA: Insufficient documentation

## 2013-08-26 DIAGNOSIS — J439 Emphysema, unspecified: Secondary | ICD-10-CM

## 2013-08-26 DIAGNOSIS — Z7901 Long term (current) use of anticoagulants: Secondary | ICD-10-CM

## 2013-08-26 DIAGNOSIS — Z9889 Other specified postprocedural states: Secondary | ICD-10-CM | POA: Insufficient documentation

## 2013-08-26 DIAGNOSIS — J449 Chronic obstructive pulmonary disease, unspecified: Secondary | ICD-10-CM | POA: Diagnosis not present

## 2013-08-26 DIAGNOSIS — F411 Generalized anxiety disorder: Secondary | ICD-10-CM | POA: Insufficient documentation

## 2013-08-26 DIAGNOSIS — Z8545 Personal history of malignant neoplasm of unspecified male genital organ: Secondary | ICD-10-CM | POA: Insufficient documentation

## 2013-08-26 DIAGNOSIS — L509 Urticaria, unspecified: Secondary | ICD-10-CM | POA: Insufficient documentation

## 2013-08-26 DIAGNOSIS — K265 Chronic or unspecified duodenal ulcer with perforation: Secondary | ICD-10-CM | POA: Diagnosis not present

## 2013-08-26 DIAGNOSIS — N183 Chronic kidney disease, stage 3 unspecified: Secondary | ICD-10-CM | POA: Insufficient documentation

## 2013-08-26 LAB — BASIC METABOLIC PANEL
BUN: 28 mg/dL — AB (ref 6–23)
CALCIUM: 9.3 mg/dL (ref 8.4–10.5)
CO2: 22 mEq/L (ref 19–32)
CREATININE: 1.58 mg/dL — AB (ref 0.50–1.35)
Chloride: 101 mEq/L (ref 96–112)
GFR calc Af Amer: 47 mL/min — ABNORMAL LOW (ref >90)
GFR calc non Af Amer: 40 mL/min — ABNORMAL LOW (ref >90)
GLUCOSE: 166 mg/dL — AB (ref 70–99)
Potassium: 4.5 mEq/L (ref 3.7–5.3)
Sodium: 143 mEq/L (ref 137–147)

## 2013-08-26 LAB — I-STAT TROPONIN, ED: Troponin i, poc: 0 ng/mL (ref 0.00–0.08)

## 2013-08-26 LAB — CBC WITH DIFFERENTIAL/PLATELET
Basophils Absolute: 0 10*3/uL (ref 0.0–0.1)
Basophils Relative: 0 % (ref 0–1)
EOS PCT: 1 % (ref 0–5)
Eosinophils Absolute: 0.1 10*3/uL (ref 0.0–0.7)
HEMATOCRIT: 49.4 % (ref 39.0–52.0)
HEMOGLOBIN: 17.2 g/dL — AB (ref 13.0–17.0)
LYMPHS ABS: 1.7 10*3/uL (ref 0.7–4.0)
LYMPHS PCT: 12 % (ref 12–46)
MCH: 31.9 pg (ref 26.0–34.0)
MCHC: 34.8 g/dL (ref 30.0–36.0)
MCV: 91.7 fL (ref 78.0–100.0)
MONO ABS: 1 10*3/uL (ref 0.1–1.0)
MONOS PCT: 7 % (ref 3–12)
NEUTROS ABS: 11.5 10*3/uL — AB (ref 1.7–7.7)
Neutrophils Relative %: 80 % — ABNORMAL HIGH (ref 43–77)
Platelets: 231 10*3/uL (ref 150–400)
RBC: 5.39 MIL/uL (ref 4.22–5.81)
RDW: 15.5 % (ref 11.5–15.5)
WBC: 14.3 10*3/uL — AB (ref 4.0–10.5)

## 2013-08-26 LAB — PROTIME-INR
INR: 1.68 — ABNORMAL HIGH (ref 0.00–1.49)
PROTHROMBIN TIME: 19.3 s — AB (ref 11.6–15.2)

## 2013-08-26 MED ORDER — PREDNISONE 20 MG PO TABS
60.0000 mg | ORAL_TABLET | Freq: Once | ORAL | Status: AC
  Administered 2013-08-26: 60 mg via ORAL
  Filled 2013-08-26: qty 3

## 2013-08-26 MED ORDER — SODIUM CHLORIDE 0.9 % IV BOLUS (SEPSIS)
1000.0000 mL | Freq: Once | INTRAVENOUS | Status: AC
  Administered 2013-08-26: 1000 mL via INTRAVENOUS

## 2013-08-26 MED ORDER — IOHEXOL 350 MG/ML SOLN
80.0000 mL | Freq: Once | INTRAVENOUS | Status: AC | PRN
  Administered 2013-08-26: 80 mL via INTRAVENOUS

## 2013-08-26 NOTE — ED Provider Notes (Signed)
CSN: [id number]     Arrival date & time 08/26/13  1618 History   First MD Initiated Contact with Patient 08/26/13 1630     No chief complaint on file.    (Consider location/radiation/quality/duration/timing/severity/associated sxs/prior Treatment) HPI Comments: Pt states that he took some ibuprofen and acetaminophen today for arthritis and states that about 30 minutes after had sudden onset SOB and later developed hives. Pt states that he has had hives before and that they didn't worry him. States he had SOB, but no chest pani. No f/c, abd pain, n/v/d. Has hx of PE but is on Coumadin and follows with Pulm. States that he has gotten hives before when he gets anxious and has seen a doctor for them who recommends symptomatic treatment. States initally felt worse than he does now but is now feeling a little better, but not at baseline. Rash is klocalized to arms, legs, and is mildly itchy, not painful, not in mouth or eyes.   Patient is a 80 y.o. male presenting with shortness of breath. The history is provided by the patient.  Shortness of Breath Severity:  Moderate Onset quality:  Sudden Duration:  2 hours Timing:  Constant Progression:  Partially resolved Chronicity:  New (pt has hx of PE in 2013 and has chronic dyspnea after PE, not on home o2, but states that dyspnea acutely worsed today) Context: not activity, not known allergens and not URI   Relieved by:  Oxygen Worsened by:  Nothing tried Ineffective treatments:  Oxygen Associated symptoms: rash   Associated symptoms: no abdominal pain, no chest pain, no cough, no fever, no hemoptysis, no sputum production, no syncope, no vomiting and no wheezing   Risk factors: hx of cancer, hx of PE/DVT, obesity and tobacco use   Risk factors: no recent alcohol use     Past Medical History  Diagnosis Date  . Prostate cancer 1992    in remission.  s/p XRT  . Emphysema   . Allergic rhinitis   . Asthma   . COPD (chronic obstructive pulmonary  disease)   . Recurrent upper respiratory infection (URI)     coughing from chronic sinusitis   . Normal cardiac stress test     reports stress test was several yrs. ago /w [employer]. Dr. Rogelia Mire, WNL   . OSA (obstructive sleep apnea) 2007    CPAP- report of settings on paper chart, seen by [facility] - seen 07/2011   . Renal stone     some passed spontaneous, & also had cystoscopy  . Arthritis     hands  . Neuromuscular disorder     reports shaking, somedays "can't hold spoon" - generalized  shakiness, is going to talk to MD about it at next appt.   . High cholesterol   . Shortness of breath on exertion   . H/O hiatal hernia   . Osteoarthritis, generalized   . Chronic renal insufficiency, stage III (moderate)   . Mixed hyperlipidemia   . PE (pulmonary embolism) 10/2011   Past Surgical History  Procedure Laterality Date  . Lung surgery  1986    presumed from RLL. Segmentectomy. Benign Nodule.   . Sinus endo w/fusion  sch. 09/15/2011    Procedure: ENDOSCOPIC SINUS SURGERY WITH FUSION NAVIGATION;  Surgeon: Ascencion Dike, MD;  Location: [place];  Service: ENT;  Laterality: Bilateral;  bilateral maxillary antrostomy, bilateral endoscopic ethmoidectomy, bilateral sphenoidectomy, bilateral frontal recess exploration  . Septoplasty  09/15/11  . Cataract extraction w/  intraocular lens  implant, bilateral  ~ 2000  . Ureteroscopy  1980's  . Sinus endo w/fusion  09/15/2011    Procedure: ENDOSCOPIC SINUS SURGERY WITH FUSION NAVIGATION;  Surgeon: Ascencion Dike, MD;  Location: [place];  Service: ENT;  Laterality: Bilateral;  . Maxillary antrostomy  09/15/2011    Procedure: MAXILLARY ANTROSTOMY;  Surgeon: Ascencion Dike, MD;  Location: [facility] OR;  Service: ENT;  Laterality: Bilateral;  TOTAL MAXILLARY ANTROSTOMY  . Sinus exploration  09/15/2011    Procedure: SINUS EXPLORATION;  Surgeon: Ascencion Dike, MD;  Location: [facility] OR;  Service: ENT;  Laterality: Bilateral;  FRONTAL RECESS EXPLORATION  .  Sphenoidectomy  09/15/2011    Procedure: Coralee Pesa;  Surgeon: Ascencion Dike, MD;  Location: [place];  Service: ENT;  Laterality: Bilateral;  . Septoplasty  09/15/2011    Procedure: SEPTOPLASTY;  Surgeon: Ascencion Dike, MD;  Location: [facility] OR;  Service: ENT;  Laterality: Bilateral;   Family History  Problem Relation Age of Onset  . Prostate cancer Brother   . Prostate cancer Brother   . Prostate cancer Brother   . Asthma Father   . Heart disease Mother     MI age 34  . Anesthesia problems Neg Hx    History  Substance Use Topics  . Smoking status: Former Smoker -- 1.00 packs/day for 60 years    Types: Cigarettes    Quit date: 08/06/2011  . Smokeless tobacco: Never Used  . Alcohol Use: 2.4 oz/week    4 Cans of beer per week     Comment: 09/15/11 "during summer"    Review of Systems  Constitutional: Negative for fever, activity change and appetite change.  HENT: Negative for congestion and rhinorrhea.   Eyes: Negative for discharge and itching.  Respiratory: Positive for shortness of breath. Negative for cough, hemoptysis, sputum production and wheezing.   Cardiovascular: Negative for chest pain and syncope.  Gastrointestinal: Negative for nausea, vomiting, abdominal pain, diarrhea and constipation.  Genitourinary: Negative for hematuria, decreased urine volume and difficulty urinating.  Skin: Positive for rash. Negative for wound.  Neurological: Negative for syncope, weakness and numbness.  All other systems reviewed and are negative.      Allergies  Mometasone furoate; Naproxen sodium; Anoro ellipta; and Tudorza  Home Medications   Current Outpatient Rx  Name  Route  Sig  Dispense  Refill  . Aclidinium Bromide (TUDORZA PRESSAIR) 400 MCG/ACT AEPB   Inhalation   Inhale 1 puff into the lungs 2 (two) times daily.         [name] Kitchen albuterol (VENTOLIN HFA) 108 (90 BASE) MCG/ACT inhaler   Inhalation   Inhale 2 puffs into the lungs every 6 (six) hours as needed for wheezing or  shortness of breath.          [name] Kitchen atorvastatin (LIPITOR) 10 MG tablet   Oral   Take 10 mg by mouth daily.          . budesonide-formoterol (SYMBICORT) 160-4.5 MCG/ACT inhaler   Inhalation   Inhale 2 puffs into the lungs 2 (two) times daily.   2 Inhaler   0   . Calcium Carbonate-Vitamin D (CALTRATE 600+D) 600-400 MG-UNIT per tablet   Oral   Take 1 tablet by mouth 2 (two) times daily. With meals.         . cetirizine (ZYRTEC) 10 MG tablet   Oral   Take 10 mg by mouth daily as needed for allergies.          [name] Kitchen  famotidine (PEPCID) 20 MG tablet   Oral   Take 20 mg by mouth 2 (two) times daily as needed for heartburn or indigestion.         . fluocinonide cream (LIDEX) 0.05 %   Topical   Apply 1 application topically daily. To infected area.         . fluticasone (FLONASE) 50 MCG/ACT nasal spray   Nasal   Place 2 sprays into the nose daily. Each nostril once a day.         . Multiple Vitamin (MULTIVITAMIN WITH MINERALS) TABS tablet   Oral   Take 1 tablet by mouth daily.         [name] Kitchen warfarin (COUMADIN) 4 MG tablet   Oral   Take 4 mg by mouth every evening.          BP 122/68  Pulse 82  Temp(Src) 98.5 F (36.9 C) (Oral)  Resp 18  SpO2 97% Physical Exam  Vitals reviewed. Constitutional: He is oriented to person, place, and time. He appears well-developed and well-nourished. No distress.  HENT:  Head: Normocephalic and atraumatic.  Mouth/Throat: Oropharynx is clear and moist. No oropharyngeal exudate.  Eyes: Conjunctivae and EOM are normal. Pupils are equal, round, and reactive to light. Right eye exhibits no discharge. Left eye exhibits no discharge. No scleral icterus.  Neck: Normal range of motion. Neck supple.  Cardiovascular: Normal rate, regular rhythm, normal heart sounds and intact distal pulses.  Exam reveals no gallop and no friction rub.   No murmur heard. Pulmonary/Chest: Breath sounds normal. He is in respiratory distress (mild tachypnea). He  has no wheezes. He has no rales.  Lungs CTAB, no wheezing, no stridor   Abdominal: Soft. He exhibits no distension and no mass. There is no tenderness.  Musculoskeletal: Normal range of motion.  Neurological: He is alert and oriented to person, place, and time. No cranial nerve deficit. He exhibits normal muscle tone. Coordination normal.  Skin: Skin is warm. Rash (diffuse urticaria, non tender, blanchable, no desquamification, not in mouth or eyes) noted. He is not diaphoretic.    ED Course  Procedures (including critical care time) Labs Review Labs Reviewed  CBC WITH DIFFERENTIAL - Abnormal; Notable for the following:    WBC 14.3 (*)    Hemoglobin 17.2 (*)    Neutrophils Relative % 80 (*)    Neutro Abs 11.5 (*)    All other components within normal limits  BASIC METABOLIC PANEL - Abnormal; Notable for the following:    Glucose, Bld 166 (*)    BUN 28 (*)    Creatinine, Ser 1.58 (*)    GFR calc non Af Amer 40 (*)    GFR calc Af Amer 47 (*)    All other components within normal limits  PROTIME-INR - Abnormal; Notable for the following:    Prothrombin Time 19.3 (*)    INR 1.68 (*)    All other components within normal limits  I-STAT TROPOININ, ED   Imaging Review Dg Chest 2 View  08/26/2013   CLINICAL DATA:  Shortness of breath. Hiatus and possible allergic reaction. COPD. Right lower lobectomy 20 years ago.  EXAM: CHEST  2 VIEW  COMPARISON:  CT ANGIO CHEST W/CM &/OR WO/CM dated 05/19/2012; DG CHEST 2 VIEW dated 08/05/2011  FINDINGS: Severe emphysema. Prior right lung surgery with osteotomy of the right sixth rib. Cardiac and mediastinal margins appear normal.  No pleural effusion.  The lungs appear otherwise clear.  IMPRESSION: 1. Severe  emphysema. 2. Postoperative findings in the right chest.   Electronically Signed   By: Sherryl Barters M.D.   On: 08/26/2013 17:42   Ct Angio Chest W/cm &/or Wo Cm  08/26/2013   CLINICAL DATA:  Short of breath.  EXAM: CT ANGIOGRAPHY CHEST WITH  CONTRAST  TECHNIQUE: Multidetector CT imaging of the chest was performed using the standard protocol during bolus administration of intravenous contrast. Multiplanar CT image reconstructions and MIPs were obtained to evaluate the vascular anatomy.  CONTRAST:  36mL OMNIPAQUE IOHEXOL 350 MG/ML SOLN  COMPARISON:  Chest CT, 05/19/2012  FINDINGS: There is no evidence of a pulmonary embolus.  The heart is normal in size and configuration. There are mild coronary artery calcifications. The great vessels are normal in caliber. There are multiple prominent mediastinal lymph nodes. The largest is a subcarinal lymph node to the right of the esophagus measuring 13.8 mm short axis. There is mild hilar adenopathy. Reference measurement of a left hilar node is 1 cm in short axis. There is a small hypoattenuating right thyroid nodule.  The esophagus appears thick-walled distally. This is nonspecific and similar to the prior exam. Consider esophagitis in the proper clinical setting.  Lungs show advanced changes of centrilobular emphysema. There is an anastomosis staple line along the lateral right lower lobe. Interstitial thickening is noted most evident in the bases. There is no lung consolidation. There are no lung masses or nodules. No pleural effusion.  Limited evaluation of the upper abdomen shows a small nonobstructing left intrarenal stone and a right renal cyst, stable.  There is a gap in the right posterior 6 rib that is likely postsurgical. This is chronic. No osteoblastic or osteolytic lesions.  Review of the MIP images confirms the above findings.  IMPRESSION: 1. No evidence of a pulmonary embolus. 2. No acute findings. 3. Advanced emphysema. 4. Mild mediastinal and hilar adenopathy, which is reactive and stable from the prior exam. 5. Wall of the distal esophagus appears thickened. Consider esophagitis in the proper clinical setting. 6. Status post wedge resection along the right lateral lower lobe, also stable.    Electronically Signed   By: Lajean Manes M.D.   On: 08/26/2013 20:28     EKG Interpretation   Date/Time:  Sunday August 26 2013 16:21:47 EDT Ventricular Rate:  113 PR Interval:  130 QRS Duration: 86 QT Interval:  350 QTC Calculation: 480 R Axis:   98 Text Interpretation:  Sinus tachycardia (new) Atrial premature complex ST  depression, consider subendocardial injury Right atrial enlargement  Anterior infarct, old Abnormal ekg Confirmed by Audie Pinto  MD, ROBERT ([id number])  on 08/26/2013 5:12:29 PM      MDM   MDM: 80 y.o. WM w/ PMHx of COPD, Hx of PE on Coumadin, Prostate ca in remission w/ cc: of SOB. Pt with chronic dyspnea, had worsenign dyspea suddenly earlier today. States he had some worsening dyspnea, and then developed urticaria. Pt with hx of spontaneous urticaria when anxious and states he saw a doctor for it before. States had some SOB, palced on O2 by EMS and now feels a little better. Has hx of PE and on Coumadin. Denies chest pain, abd pain, n/v/d, f/c  Rash is urticarial, not c/w SJS/TEN/SSSS as not tender, afebrile, no desquamification. INR shows subtherapeutic INR. COncern for PE in patient with hx of PE with sudden onset SOB. CXR with advanced emphysema. D/w radiologist on call since patient has CKD who recommends since patient is not diabetic, that he can safely  receive IV contrast with 1 L IV before receiving. Discussed this with patient and explained possible risks to kidney and patietn is agreeable to contrast. CTA scan obtained without PE or PNA. Pt feels better wihtout intervention stating "I feel 95% better and am ready to go home." Maintaining sats on RA of high 90s. For urticaria, he received Benadryl by EMS and Prednisone while he was here and urticaria improved in ED. Trop negative, EKG negative, with no chest pain, improving SOB, unlikely ACS, not c/w history. D/w patient return precautions including worsening SOB, chest pain, fever. Follow up PCP in 3 days if still having  SOB. Discharged. Care of case d/w my attending.  Final diagnoses:  Emphysema lung  Shortness of breath    Discharged   Sol Passer, MD 08/27/13 [phone]

## 2013-08-26 NOTE — ED Notes (Signed)
Patient presents to ED via EMS from home with complaints of right hand swelling red blotches all over nausea and shortness of breath. Patient called doc and nurse told him to take tylenol and motrin. EMS gave 5mg  of albuterol and 50mg  iv benadryl, piv in left wrist.

## 2013-08-26 NOTE — ED Notes (Signed)
Pt states understanding of discharge instructions 

## 2013-08-26 NOTE — ED Notes (Signed)
PT reported he did not have the current rash this morning. Pt reported he has had the rash during previous episodes of SHOB. EMS repored giving Pt Benadryl for rash.

## 2013-08-28 ENCOUNTER — Encounter (HOSPITAL_COMMUNITY): Admission: EM | Disposition: A | Discharge status: Home / Self Care | Payer: Self-pay | Source: Home / Self Care

## 2013-08-28 ENCOUNTER — Encounter (HOSPITAL_COMMUNITY): Payer: Self-pay | Admitting: Emergency Medicine

## 2013-08-28 ENCOUNTER — Emergency Department (HOSPITAL_COMMUNITY): Payer: Medicare Other | Admitting: Anesthesiology

## 2013-08-28 ENCOUNTER — Inpatient Hospital Stay (HOSPITAL_COMMUNITY)
Admission: EM | Admit: 2013-08-28 | Discharge: 2013-09-07 | DRG: 326 | Disposition: A | Discharge status: Home Health | Payer: Medicare Other | Attending: General Surgery | Admitting: General Surgery

## 2013-08-28 ENCOUNTER — Encounter (HOSPITAL_COMMUNITY): Payer: Medicare Other | Admitting: Anesthesiology

## 2013-08-28 ENCOUNTER — Inpatient Hospital Stay (HOSPITAL_COMMUNITY): Payer: Medicare Other

## 2013-08-28 ENCOUNTER — Emergency Department (HOSPITAL_COMMUNITY): Payer: Medicare Other

## 2013-08-28 DIAGNOSIS — M159 Polyosteoarthritis, unspecified: Secondary | ICD-10-CM | POA: Diagnosis present

## 2013-08-28 DIAGNOSIS — Z8546 Personal history of malignant neoplasm of prostate: Secondary | ICD-10-CM | POA: Diagnosis not present

## 2013-08-28 DIAGNOSIS — J449 Chronic obstructive pulmonary disease, unspecified: Secondary | ICD-10-CM

## 2013-08-28 DIAGNOSIS — K266 Chronic or unspecified duodenal ulcer with both hemorrhage and perforation: Secondary | ICD-10-CM | POA: Diagnosis not present

## 2013-08-28 DIAGNOSIS — K631 Perforation of intestine (nontraumatic): Secondary | ICD-10-CM

## 2013-08-28 DIAGNOSIS — R109 Unspecified abdominal pain: Secondary | ICD-10-CM | POA: Diagnosis not present

## 2013-08-28 DIAGNOSIS — K219 Gastro-esophageal reflux disease without esophagitis: Secondary | ICD-10-CM | POA: Diagnosis present

## 2013-08-28 DIAGNOSIS — Z825 Family history of asthma and other chronic lower respiratory diseases: Secondary | ICD-10-CM

## 2013-08-28 DIAGNOSIS — R69 Illness, unspecified: Secondary | ICD-10-CM | POA: Diagnosis not present

## 2013-08-28 DIAGNOSIS — I2782 Chronic pulmonary embolism: Secondary | ICD-10-CM | POA: Diagnosis not present

## 2013-08-28 DIAGNOSIS — R7401 Elevation of levels of liver transaminase levels: Secondary | ICD-10-CM | POA: Diagnosis not present

## 2013-08-28 DIAGNOSIS — N183 Chronic kidney disease, stage 3 unspecified: Secondary | ICD-10-CM | POA: Diagnosis present

## 2013-08-28 DIAGNOSIS — R198 Other specified symptoms and signs involving the digestive system and abdomen: Secondary | ICD-10-CM | POA: Diagnosis present

## 2013-08-28 DIAGNOSIS — F17200 Nicotine dependence, unspecified, uncomplicated: Secondary | ICD-10-CM | POA: Diagnosis present

## 2013-08-28 DIAGNOSIS — R4182 Altered mental status, unspecified: Secondary | ICD-10-CM

## 2013-08-28 DIAGNOSIS — J9600 Acute respiratory failure, unspecified whether with hypoxia or hypercapnia: Secondary | ICD-10-CM

## 2013-08-28 DIAGNOSIS — G4710 Hypersomnia, unspecified: Secondary | ICD-10-CM | POA: Diagnosis not present

## 2013-08-28 DIAGNOSIS — I129 Hypertensive chronic kidney disease with stage 1 through stage 4 chronic kidney disease, or unspecified chronic kidney disease: Secondary | ICD-10-CM | POA: Diagnosis present

## 2013-08-28 DIAGNOSIS — J310 Chronic rhinitis: Secondary | ICD-10-CM

## 2013-08-28 DIAGNOSIS — K5732 Diverticulitis of large intestine without perforation or abscess without bleeding: Secondary | ICD-10-CM | POA: Diagnosis not present

## 2013-08-28 DIAGNOSIS — G4733 Obstructive sleep apnea (adult) (pediatric): Secondary | ICD-10-CM | POA: Diagnosis present

## 2013-08-28 DIAGNOSIS — J438 Other emphysema: Secondary | ICD-10-CM | POA: Diagnosis not present

## 2013-08-28 DIAGNOSIS — A419 Sepsis, unspecified organism: Secondary | ICD-10-CM | POA: Diagnosis present

## 2013-08-28 DIAGNOSIS — Z86711 Personal history of pulmonary embolism: Secondary | ICD-10-CM

## 2013-08-28 DIAGNOSIS — K659 Peritonitis, unspecified: Secondary | ICD-10-CM | POA: Diagnosis present

## 2013-08-28 DIAGNOSIS — R7989 Other specified abnormal findings of blood chemistry: Secondary | ICD-10-CM

## 2013-08-28 DIAGNOSIS — J329 Chronic sinusitis, unspecified: Secondary | ICD-10-CM

## 2013-08-28 DIAGNOSIS — Z452 Encounter for adjustment and management of vascular access device: Secondary | ICD-10-CM | POA: Diagnosis not present

## 2013-08-28 DIAGNOSIS — Z8042 Family history of malignant neoplasm of prostate: Secondary | ICD-10-CM

## 2013-08-28 DIAGNOSIS — J42 Unspecified chronic bronchitis: Secondary | ICD-10-CM | POA: Diagnosis not present

## 2013-08-28 DIAGNOSIS — K265 Chronic or unspecified duodenal ulcer with perforation: Principal | ICD-10-CM | POA: Diagnosis present

## 2013-08-28 DIAGNOSIS — Z8249 Family history of ischemic heart disease and other diseases of the circulatory system: Secondary | ICD-10-CM | POA: Diagnosis not present

## 2013-08-28 DIAGNOSIS — Z7901 Long term (current) use of anticoagulants: Secondary | ICD-10-CM | POA: Diagnosis not present

## 2013-08-28 DIAGNOSIS — R9431 Abnormal electrocardiogram [ECG] [EKG]: Secondary | ICD-10-CM | POA: Diagnosis not present

## 2013-08-28 DIAGNOSIS — R6520 Severe sepsis without septic shock: Secondary | ICD-10-CM

## 2013-08-28 DIAGNOSIS — D689 Coagulation defect, unspecified: Secondary | ICD-10-CM | POA: Diagnosis not present

## 2013-08-28 DIAGNOSIS — R1033 Periumbilical pain: Secondary | ICD-10-CM | POA: Diagnosis not present

## 2013-08-28 DIAGNOSIS — R7402 Elevation of levels of lactic acid dehydrogenase (LDH): Secondary | ICD-10-CM | POA: Diagnosis not present

## 2013-08-28 DIAGNOSIS — R110 Nausea: Secondary | ICD-10-CM

## 2013-08-28 DIAGNOSIS — N200 Calculus of kidney: Secondary | ICD-10-CM | POA: Diagnosis not present

## 2013-08-28 DIAGNOSIS — K5710 Diverticulosis of small intestine without perforation or abscess without bleeding: Secondary | ICD-10-CM | POA: Diagnosis present

## 2013-08-28 DIAGNOSIS — Z4682 Encounter for fitting and adjustment of non-vascular catheter: Secondary | ICD-10-CM | POA: Diagnosis not present

## 2013-08-28 HISTORY — PX: GASTROSTOMY: SHX5249

## 2013-08-28 HISTORY — PX: REPAIR OF PERFORATED ULCER: SHX6065

## 2013-08-28 HISTORY — PX: ESOPHAGOGASTRODUODENOSCOPY: SHX5428

## 2013-08-28 HISTORY — PX: LAPAROTOMY: SHX154

## 2013-08-28 LAB — POCT I-STAT 3, ART BLOOD GAS (G3+)
Acid-base deficit: 1 mmol/L (ref 0.0–2.0)
Bicarbonate: 25.3 mEq/L — ABNORMAL HIGH (ref 20.0–24.0)
O2 SAT: 99 %
PCO2 ART: 46 mmHg — AB (ref 35.0–45.0)
Patient temperature: 98.2
TCO2: 27 mmol/L (ref 0–100)
pH, Arterial: 7.347 — ABNORMAL LOW (ref 7.350–7.450)
pO2, Arterial: 129 mmHg — ABNORMAL HIGH (ref 80.0–100.0)

## 2013-08-28 LAB — GLUCOSE, CAPILLARY
Glucose-Capillary: 130 mg/dL — ABNORMAL HIGH (ref 70–99)
Glucose-Capillary: 117 mg/dL — ABNORMAL HIGH (ref 70–99)

## 2013-08-28 LAB — URINALYSIS, ROUTINE W REFLEX MICROSCOPIC
BILIRUBIN URINE: NEGATIVE
GLUCOSE, UA: NEGATIVE mg/dL
Ketones, ur: NEGATIVE mg/dL
Leukocytes, UA: NEGATIVE
Nitrite: NEGATIVE
Protein, ur: 100 mg/dL — AB
Specific Gravity, Urine: 1.038 — ABNORMAL HIGH (ref 1.005–1.030)
Urobilinogen, UA: 0.2 mg/dL (ref 0.0–1.0)
pH: 5 (ref 5.0–8.0)

## 2013-08-28 LAB — PROTIME-INR
INR: 2.24 — ABNORMAL HIGH (ref 0.00–1.49)
INR: 1.79 — ABNORMAL HIGH (ref 0.00–1.49)
INR: 1.67 — AB (ref 0.00–1.49)
Prothrombin Time: 24.1 seconds — ABNORMAL HIGH (ref 11.6–15.2)
Prothrombin Time: 20.3 seconds — ABNORMAL HIGH (ref 11.6–15.2)
Prothrombin Time: 19.2 seconds — ABNORMAL HIGH (ref 11.6–15.2)

## 2013-08-28 LAB — CREATININE, SERUM
CREATININE: 1.21 mg/dL (ref 0.50–1.35)
GFR calc Af Amer: 64 mL/min — ABNORMAL LOW (ref >90)
GFR calc non Af Amer: 56 mL/min — ABNORMAL LOW (ref >90)

## 2013-08-28 LAB — CBC WITH DIFFERENTIAL/PLATELET
Basophils Absolute: 0 10*3/uL (ref 0.0–0.1)
Basophils Relative: 0 % (ref 0–1)
Eosinophils Absolute: 0 10*3/uL (ref 0.0–0.7)
Eosinophils Relative: 0 % (ref 0–5)
HEMATOCRIT: 44.6 % (ref 39.0–52.0)
Hemoglobin: 15.5 g/dL (ref 13.0–17.0)
LYMPHS ABS: 1.9 10*3/uL (ref 0.7–4.0)
LYMPHS PCT: 13 % (ref 12–46)
MCH: 31.8 pg (ref 26.0–34.0)
MCHC: 34.8 g/dL (ref 30.0–36.0)
MCV: 91.6 fL (ref 78.0–100.0)
MONO ABS: 1.4 10*3/uL — AB (ref 0.1–1.0)
Monocytes Relative: 9 % (ref 3–12)
Neutro Abs: 11.5 10*3/uL — ABNORMAL HIGH (ref 1.7–7.7)
Neutrophils Relative %: 78 % — ABNORMAL HIGH (ref 43–77)
Platelets: 230 10*3/uL (ref 150–400)
RBC: 4.87 MIL/uL (ref 4.22–5.81)
RDW: 15.6 % — ABNORMAL HIGH (ref 11.5–15.5)
WBC: 14.8 10*3/uL — AB (ref 4.0–10.5)

## 2013-08-28 LAB — TYPE AND SCREEN
ABO/RH(D): A POS
Antibody Screen: NEGATIVE

## 2013-08-28 LAB — CBC
HCT: 38.7 % — ABNORMAL LOW (ref 39.0–52.0)
HEMOGLOBIN: 13.2 g/dL (ref 13.0–17.0)
MCH: 31.6 pg (ref 26.0–34.0)
MCHC: 34.1 g/dL (ref 30.0–36.0)
MCV: 92.6 fL (ref 78.0–100.0)
Platelets: 208 10*3/uL (ref 150–400)
RBC: 4.18 MIL/uL — ABNORMAL LOW (ref 4.22–5.81)
RDW: 15.9 % — ABNORMAL HIGH (ref 11.5–15.5)
WBC: 10.5 10*3/uL (ref 4.0–10.5)

## 2013-08-28 LAB — COMPREHENSIVE METABOLIC PANEL
ALT: 24 U/L (ref 0–53)
AST: 28 U/L (ref 0–37)
Albumin: 3.4 g/dL — ABNORMAL LOW (ref 3.5–5.2)
Alkaline Phosphatase: 81 U/L (ref 39–117)
BILIRUBIN TOTAL: 0.4 mg/dL (ref 0.3–1.2)
BUN: 25 mg/dL — ABNORMAL HIGH (ref 6–23)
CO2: 24 meq/L (ref 19–32)
Calcium: 9 mg/dL (ref 8.4–10.5)
Chloride: 103 mEq/L (ref 96–112)
Creatinine, Ser: 1.25 mg/dL (ref 0.50–1.35)
GFR calc Af Amer: 62 mL/min — ABNORMAL LOW (ref >90)
GFR, EST NON AFRICAN AMERICAN: 53 mL/min — AB (ref >90)
GLUCOSE: 122 mg/dL — AB (ref 70–99)
Potassium: 4.3 mEq/L (ref 3.7–5.3)
SODIUM: 142 meq/L (ref 137–147)
Total Protein: 7.1 g/dL (ref 6.0–8.3)

## 2013-08-28 LAB — URINE MICROSCOPIC-ADD ON

## 2013-08-28 LAB — ABO/RH: ABO/RH(D): A POS

## 2013-08-28 LAB — LIPASE, BLOOD: Lipase: 17 U/L (ref 11–59)

## 2013-08-28 LAB — I-STAT CG4 LACTIC ACID, ED: Lactic Acid, Venous: 2.54 mmol/L — ABNORMAL HIGH (ref 0.5–2.2)

## 2013-08-28 LAB — TRIGLYCERIDES: Triglycerides: 56 mg/dL (ref <150)

## 2013-08-28 LAB — MRSA PCR SCREENING: MRSA BY PCR: NEGATIVE

## 2013-08-28 SURGERY — LAPAROTOMY, EXPLORATORY
Anesthesia: General

## 2013-08-28 MED ORDER — BUDESONIDE 0.5 MG/2ML IN SUSP
0.5000 mg | Freq: Two times a day (BID) | RESPIRATORY_TRACT | Status: DC
  Filled 2013-08-28: qty 2

## 2013-08-28 MED ORDER — MORPHINE SULFATE 4 MG/ML IJ SOLN
4.0000 mg | Freq: Once | INTRAMUSCULAR | Status: AC
  Administered 2013-08-28: 4 mg via INTRAVENOUS
  Filled 2013-08-28: qty 1

## 2013-08-28 MED ORDER — HYDROMORPHONE HCL PF 1 MG/ML IJ SOLN: 0.2500 mg | INTRAMUSCULAR | Status: DC | PRN

## 2013-08-28 MED ORDER — 0.9 % SODIUM CHLORIDE (POUR BTL) OPTIME
TOPICAL | Status: DC | PRN
  Administered 2013-08-28 (×2): 1000 mL

## 2013-08-28 MED ORDER — ALBUTEROL SULFATE (2.5 MG/3ML) 0.083% IN NEBU: 2.5000 mg | INHALATION_SOLUTION | RESPIRATORY_TRACT | Status: DC | PRN

## 2013-08-28 MED ORDER — PROPOFOL 10 MG/ML IV BOLUS
INTRAVENOUS | Status: DC | PRN
  Administered 2013-08-28: 160 mg via INTRAVENOUS

## 2013-08-28 MED ORDER — SUCCINYLCHOLINE CHLORIDE 20 MG/ML IJ SOLN
INTRAMUSCULAR | Status: DC | PRN
  Administered 2013-08-28: 120 mg via INTRAVENOUS

## 2013-08-28 MED ORDER — ARTIFICIAL TEARS OP OINT
TOPICAL_OINTMENT | OPHTHALMIC | Status: AC
  Filled 2013-08-28: qty 3.5

## 2013-08-28 MED ORDER — MORPHINE SULFATE 4 MG/ML IJ SOLN
4.0000 mg | INTRAMUSCULAR | Status: DC | PRN
  Administered 2013-08-28: 4 mg via INTRAVENOUS
  Filled 2013-08-28: qty 1

## 2013-08-28 MED ORDER — STERILE WATER FOR INJECTION IJ SOLN
INTRAMUSCULAR | Status: AC
  Filled 2013-08-28: qty 10

## 2013-08-28 MED ORDER — IOHEXOL 300 MG/ML  SOLN
25.0000 mL | INTRAMUSCULAR | Status: AC
  Administered 2013-08-28: 25 mL via ORAL

## 2013-08-28 MED ORDER — FENTANYL CITRATE 0.05 MG/ML IJ SOLN
50.0000 ug | INTRAMUSCULAR | Status: DC | PRN
  Administered 2013-08-28 (×2): 50 ug via INTRAVENOUS
  Filled 2013-08-28 (×2): qty 2

## 2013-08-28 MED ORDER — ONDANSETRON HCL 4 MG/2ML IJ SOLN
4.0000 mg | Freq: Once | INTRAMUSCULAR | Status: AC
  Administered 2013-08-28: 4 mg via INTRAVENOUS
  Filled 2013-08-28: qty 2

## 2013-08-28 MED ORDER — LIDOCAINE HCL (CARDIAC) 20 MG/ML IV SOLN
INTRAVENOUS | Status: AC
Start: 2013-08-28 — End: 2013-08-28
  Filled 2013-08-28: qty 5

## 2013-08-28 MED ORDER — SODIUM CHLORIDE 0.9 % IV SOLN
Freq: Once | INTRAVENOUS | Status: AC
  Administered 2013-08-28: 05:00:00 via INTRAVENOUS

## 2013-08-28 MED ORDER — SODIUM CHLORIDE 0.9 % IJ SOLN
INTRAMUSCULAR | Status: AC
  Filled 2013-08-28: qty 10

## 2013-08-28 MED ORDER — VECURONIUM BROMIDE 10 MG IV SOLR
INTRAVENOUS | Status: AC
  Filled 2013-08-28: qty 10

## 2013-08-28 MED ORDER — PROPOFOL 10 MG/ML IV EMUL
5.0000 ug/kg/min | INTRAVENOUS | Status: DC
  Administered 2013-08-28: 10 ug/kg/min via INTRAVENOUS
  Filled 2013-08-28 (×2): qty 100

## 2013-08-28 MED ORDER — OXYCODONE HCL 5 MG/5ML PO SOLN: 5.0000 mg | Freq: Once | ORAL | Status: DC | PRN

## 2013-08-28 MED ORDER — IOHEXOL 300 MG/ML  SOLN
80.0000 mL | Freq: Once | INTRAMUSCULAR | Status: AC | PRN
  Administered 2013-08-28: 85 mL via INTRAVENOUS

## 2013-08-28 MED ORDER — ALBUTEROL SULFATE HFA 108 (90 BASE) MCG/ACT IN AERS: 2.0000 | INHALATION_SPRAY | Freq: Four times a day (QID) | RESPIRATORY_TRACT | Status: DC | PRN

## 2013-08-28 MED ORDER — BIOTENE DRY MOUTH MT LIQD
15.0000 mL | Freq: Four times a day (QID) | OROMUCOSAL | Status: DC
  Administered 2013-08-28 – 2013-08-29 (×3): 15 mL via OROMUCOSAL

## 2013-08-28 MED ORDER — PROPOFOL 10 MG/ML IV BOLUS
INTRAVENOUS | Status: AC
  Filled 2013-08-28: qty 20

## 2013-08-28 MED ORDER — SODIUM CHLORIDE 0.9 % IV SOLN
10.0000 mg | INTRAVENOUS | Status: DC | PRN
  Administered 2013-08-28: 10 ug/min via INTRAVENOUS

## 2013-08-28 MED ORDER — ONDANSETRON HCL 4 MG/2ML IJ SOLN: 4.0000 mg | INTRAMUSCULAR | Status: DC | PRN

## 2013-08-28 MED ORDER — FENTANYL CITRATE 0.05 MG/ML IJ SOLN
INTRAMUSCULAR | Status: DC | PRN
  Administered 2013-08-28: 50 ug via INTRAVENOUS
  Administered 2013-08-28: 100 ug via INTRAVENOUS
  Administered 2013-08-28: 50 ug via INTRAVENOUS
  Administered 2013-08-28: 100 ug via INTRAVENOUS
  Administered 2013-08-28: 50 ug via INTRAVENOUS
  Administered 2013-08-28: 100 ug via INTRAVENOUS
  Administered 2013-08-28: 50 ug via INTRAVENOUS

## 2013-08-28 MED ORDER — FLUCONAZOLE 100MG IVPB
100.0000 mg | INTRAVENOUS | Status: DC
  Administered 2013-08-28 – 2013-09-06 (×10): 100 mg via INTRAVENOUS
  Filled 2013-08-28 (×11): qty 50

## 2013-08-28 MED ORDER — FENTANYL CITRATE 0.05 MG/ML IJ SOLN
INTRAMUSCULAR | Status: AC
  Filled 2013-08-28: qty 5

## 2013-08-28 MED ORDER — LACTATED RINGERS IV SOLN
INTRAVENOUS | Status: DC | PRN
  Administered 2013-08-28: 12:00:00 via INTRAVENOUS

## 2013-08-28 MED ORDER — LIDOCAINE HCL (CARDIAC) 20 MG/ML IV SOLN
INTRAVENOUS | Status: DC | PRN
  Administered 2013-08-28: 100 mg via INTRAVENOUS

## 2013-08-28 MED ORDER — PIPERACILLIN-TAZOBACTAM 3.375 G IVPB 30 MIN
3.3750 g | Freq: Once | INTRAVENOUS | Status: AC
  Administered 2013-08-28: 3.375 g via INTRAVENOUS

## 2013-08-28 MED ORDER — ONDANSETRON HCL 4 MG/2ML IJ SOLN
INTRAMUSCULAR | Status: AC
  Filled 2013-08-28: qty 2

## 2013-08-28 MED ORDER — OXYCODONE HCL 5 MG PO TABS: 5.0000 mg | ORAL_TABLET | Freq: Once | ORAL | Status: DC | PRN

## 2013-08-28 MED ORDER — PIPERACILLIN-TAZOBACTAM 3.375 G IVPB
3.3750 g | Freq: Three times a day (TID) | INTRAVENOUS | Status: DC
Start: 2013-08-28 — End: 2013-08-28
  Filled 2013-08-28: qty 50

## 2013-08-28 MED ORDER — ALBUMIN HUMAN 5 % IV SOLN
INTRAVENOUS | Status: DC | PRN
  Administered 2013-08-28: 14:00:00 via INTRAVENOUS

## 2013-08-28 MED ORDER — TIOTROPIUM BROMIDE MONOHYDRATE 18 MCG IN CAPS
18.0000 ug | ORAL_CAPSULE | Freq: Every day | RESPIRATORY_TRACT | Status: DC
  Filled 2013-08-28: qty 5

## 2013-08-28 MED ORDER — KCL IN DEXTROSE-NACL 20-5-0.45 MEQ/L-%-% IV SOLN
INTRAVENOUS | Status: DC
  Administered 2013-08-29 – 2013-08-30 (×3): via INTRAVENOUS
  Administered 2013-08-31: 100 mL/h via INTRAVENOUS
  Administered 2013-08-31 – 2013-09-01 (×2): via INTRAVENOUS
  Administered 2013-09-01: 1000 mL via INTRAVENOUS
  Administered 2013-09-01 – 2013-09-05 (×6): via INTRAVENOUS
  Filled 2013-08-28 (×21): qty 1000

## 2013-08-28 MED ORDER — ROCURONIUM BROMIDE 100 MG/10ML IV SOLN
INTRAVENOUS | Status: DC | PRN
  Administered 2013-08-28 (×2): 10 mg via INTRAVENOUS
  Administered 2013-08-28: 40 mg via INTRAVENOUS

## 2013-08-28 MED ORDER — ROCURONIUM BROMIDE 50 MG/5ML IV SOLN
INTRAVENOUS | Status: AC
  Filled 2013-08-28: qty 1

## 2013-08-28 MED ORDER — IOHEXOL 300 MG/ML  SOLN: 20.0000 mL | INTRAMUSCULAR | Status: DC

## 2013-08-28 MED ORDER — EPHEDRINE SULFATE 50 MG/ML IJ SOLN
INTRAMUSCULAR | Status: AC
  Filled 2013-08-28: qty 1

## 2013-08-28 MED ORDER — ENOXAPARIN SODIUM 40 MG/0.4ML ~~LOC~~ SOLN
40.0000 mg | SUBCUTANEOUS | Status: DC
  Administered 2013-08-30 – 2013-09-06 (×8): 40 mg via SUBCUTANEOUS
  Filled 2013-08-28 (×10): qty 0.4

## 2013-08-28 MED ORDER — CHLORHEXIDINE GLUCONATE 0.12 % MT SOLN
15.0000 mL | Freq: Two times a day (BID) | OROMUCOSAL | Status: DC
  Administered 2013-08-28 – 2013-08-29 (×2): 15 mL via OROMUCOSAL
  Filled 2013-08-28 (×2): qty 15

## 2013-08-28 MED ORDER — PANTOPRAZOLE SODIUM 40 MG IV SOLR
40.0000 mg | Freq: Every day | INTRAVENOUS | Status: DC
  Administered 2013-08-28: 40 mg via INTRAVENOUS
  Filled 2013-08-28 (×2): qty 40

## 2013-08-28 MED ORDER — PIPERACILLIN-TAZOBACTAM 3.375 G IVPB
3.3750 g | Freq: Three times a day (TID) | INTRAVENOUS | Status: DC
  Administered 2013-08-28 – 2013-09-07 (×29): 3.375 g via INTRAVENOUS
  Filled 2013-08-28 (×35): qty 50

## 2013-08-28 MED ORDER — FAMOTIDINE IN NACL 20-0.9 MG/50ML-% IV SOLN
20.0000 mg | Freq: Two times a day (BID) | INTRAVENOUS | Status: DC
  Administered 2013-08-28 – 2013-09-04 (×15): 20 mg via INTRAVENOUS
  Filled 2013-08-28 (×19): qty 50

## 2013-08-28 MED ORDER — SODIUM CHLORIDE 0.9 % IV SOLN
INTRAVENOUS | Status: DC
  Administered 2013-08-28: 16:00:00 via INTRAVENOUS

## 2013-08-28 MED ORDER — PANTOPRAZOLE SODIUM 40 MG IV SOLR
40.0000 mg | Freq: Once | INTRAVENOUS | Status: AC
  Administered 2013-08-28: 40 mg via INTRAVENOUS
  Filled 2013-08-28: qty 40

## 2013-08-28 MED ORDER — METOCLOPRAMIDE HCL 5 MG/ML IJ SOLN
10.0000 mg | Freq: Once | INTRAMUSCULAR | Status: DC | PRN
  Filled 2013-08-28: qty 2

## 2013-08-28 MED ORDER — ARFORMOTEROL TARTRATE 15 MCG/2ML IN NEBU
15.0000 ug | INHALATION_SOLUTION | Freq: Two times a day (BID) | RESPIRATORY_TRACT | Status: DC
  Filled 2013-08-28: qty 2

## 2013-08-28 MED ORDER — FENTANYL CITRATE 0.05 MG/ML IJ SOLN: 50.0000 ug | INTRAMUSCULAR | Status: DC | PRN

## 2013-08-28 SURGICAL SUPPLY — 56 items
APPLIER CLIP 11 MED OPEN (CLIP) ×2
BLADE SURG ROTATE 9660 (MISCELLANEOUS) ×2 IMPLANT
CANISTER SUCTION 2500CC (MISCELLANEOUS) ×2 IMPLANT
CHLORAPREP W/TINT 26ML (MISCELLANEOUS) ×2 IMPLANT
CLIP APPLIE 11 MED OPEN (CLIP) ×1 IMPLANT
COVER MAYO STAND STRL (DRAPES) IMPLANT
COVER SURGICAL LIGHT HANDLE (MISCELLANEOUS) ×2 IMPLANT
DRAIN CHANNEL 19F RND (DRAIN) ×4 IMPLANT
DRAPE LAPAROSCOPIC ABDOMINAL (DRAPES) ×2 IMPLANT
DRAPE PROXIMA HALF (DRAPES) IMPLANT
DRAPE UTILITY 15X26 W/TAPE STR (DRAPE) ×4 IMPLANT
DRAPE WARM FLUID 44X44 (DRAPE) ×2 IMPLANT
DRSG OPSITE POSTOP 4X10 (GAUZE/BANDAGES/DRESSINGS) IMPLANT
DRSG OPSITE POSTOP 4X8 (GAUZE/BANDAGES/DRESSINGS) IMPLANT
ELECT BLADE 6.5 EXT (BLADE) ×2 IMPLANT
ELECT CAUTERY BLADE 6.4 (BLADE) ×4 IMPLANT
ELECT REM PT RETURN 9FT ADLT (ELECTROSURGICAL) ×2
ELECTRODE REM PT RTRN 9FT ADLT (ELECTROSURGICAL) ×1 IMPLANT
EVACUATOR SILICONE 100CC (DRAIN) ×4 IMPLANT
GLOVE BIO SURGEON STRL SZ7 (GLOVE) ×2 IMPLANT
GLOVE BIOGEL M STRL SZ7.5 (GLOVE) ×4 IMPLANT
GLOVE BIOGEL PI IND STRL 7.5 (GLOVE) ×1 IMPLANT
GLOVE BIOGEL PI IND STRL 8 (GLOVE) ×2 IMPLANT
GLOVE BIOGEL PI INDICATOR 7.5 (GLOVE) ×1
GLOVE BIOGEL PI INDICATOR 8 (GLOVE) ×2
GOWN L4 XXLG W/PAP TWL (GOWN DISPOSABLE) ×4 IMPLANT
GOWN STRL REUS W/ TWL LRG LVL3 (GOWN DISPOSABLE) ×3 IMPLANT
GOWN STRL REUS W/TWL LRG LVL3 (GOWN DISPOSABLE) ×3
KIT BASIN OR (CUSTOM PROCEDURE TRAY) ×2 IMPLANT
KIT ROOM TURNOVER OR (KITS) ×2 IMPLANT
LIGASURE IMPACT 36 18CM CVD LR (INSTRUMENTS) ×2 IMPLANT
NS IRRIG 1000ML POUR BTL (IV SOLUTION) ×4 IMPLANT
PACK GENERAL/GYN (CUSTOM PROCEDURE TRAY) ×2 IMPLANT
PAD ABD 8X10 STRL (GAUZE/BANDAGES/DRESSINGS) ×6 IMPLANT
PAD ARMBOARD 7.5X6 YLW CONV (MISCELLANEOUS) ×2 IMPLANT
PENCIL BUTTON HOLSTER BLD 10FT (ELECTRODE) IMPLANT
SPECIMEN JAR LARGE (MISCELLANEOUS) IMPLANT
SPONGE GAUZE 4X4 12PLY STER LF (GAUZE/BANDAGES/DRESSINGS) ×4 IMPLANT
SPONGE LAP 18X18 X RAY DECT (DISPOSABLE) ×2 IMPLANT
STAPLER VISISTAT 35W (STAPLE) ×2 IMPLANT
SUCTION POOLE TIP (SUCTIONS) ×2 IMPLANT
SUT ETHILON 2 0 FS 18 (SUTURE) ×8 IMPLANT
SUT PDS AB 1 TP1 96 (SUTURE) ×8 IMPLANT
SUT SILK 2 0 (SUTURE) ×1
SUT SILK 2 0 SH CR/8 (SUTURE) ×4 IMPLANT
SUT SILK 2-0 18XBRD TIE 12 (SUTURE) ×1 IMPLANT
SUT SILK 3 0 (SUTURE) ×1
SUT SILK 3 0 SH CR/8 (SUTURE) ×2 IMPLANT
SUT SILK 3-0 18XBRD TIE 12 (SUTURE) ×1 IMPLANT
SUT VIC AB 3-0 SH 27 (SUTURE)
SUT VIC AB 3-0 SH 27X BRD (SUTURE) IMPLANT
TAPE CLOTH SURG 6X10 WHT LF (GAUZE/BANDAGES/DRESSINGS) ×2 IMPLANT
TOWEL OR 17X26 10 PK STRL BLUE (TOWEL DISPOSABLE) ×2 IMPLANT
TRAY FOLEY CATH 16FRSI W/METER (SET/KITS/TRAYS/PACK) ×2 IMPLANT
TUBE CONNECTING 12X1/4 (SUCTIONS) ×2 IMPLANT
YANKAUER SUCT BULB TIP NO VENT (SUCTIONS) IMPLANT

## 2013-08-28 NOTE — H&P (Signed)
Agree with above, discussed procedure with likely patch as well as finding of possible intraluminal lesion.  Risks discussed including failure of patch, bleeding, postop infection, reoperation, g tube, dvt/pe, cardiac complications, stroke and death. Will proceed asap after ffp

## 2013-08-28 NOTE — ED Notes (Signed)
General surgery, PA notified re: FFP completion, aware of need for Diflucan IVPB to be administered in OR

## 2013-08-28 NOTE — Preoperative (Signed)
Beta Blockers   Reason not to administer Beta Blockers:Not Applicable 

## 2013-08-28 NOTE — Anesthesia Preprocedure Evaluation (Addendum)
Anesthesia Evaluation  Patient identified by MRN, date of birth, ID band Patient awake    Reviewed: Allergy & Precautions, H&P , NPO status , Patient's Chart, lab work & pertinent test results, reviewed documented beta blocker date and time   Airway Mallampati: II TM Distance: >3 FB Neck ROM: full    Dental  (+) Partial Upper, Partial Lower   Pulmonary shortness of breath, asthma , sleep apnea , COPD COPD inhaler, Recent URI , former smoker,  breath sounds clear to auscultation        Cardiovascular negative cardio ROS  Rhythm:regular     Neuro/Psych  Neuromuscular disease negative psych ROS   GI/Hepatic negative GI ROS, Neg liver ROS,   Endo/Other  negative endocrine ROS  Renal/GU Renal InsufficiencyRenal disease  negative genitourinary   Musculoskeletal   Abdominal   Peds  Hematology  (+) Blood dyscrasia, ,   Anesthesia Other Findings See surgeon's H&P   Reproductive/Obstetrics negative OB ROS                          Anesthesia Physical Anesthesia Plan  ASA: III  Anesthesia Plan: General   Post-op Pain Management:    Induction: Intravenous  Airway Management Planned: Oral ETT  Additional Equipment:   Intra-op Plan:   Post-operative Plan: Extubation in OR  Informed Consent: I have reviewed the patients History and Physical, chart, labs and discussed the procedure including the risks, benefits and alternatives for the proposed anesthesia with the patient or authorized representative who has indicated his/her understanding and acceptance.   Dental Advisory Given  Plan Discussed with: CRNA and Surgeon  Anesthesia Plan Comments:         Anesthesia Quick Evaluation

## 2013-08-28 NOTE — Op Note (Signed)
Jon White [id number] 08/29/1932 08/28/2013  Preoperative diagnosis: Perforated duodenal ulcer  Postoperative diagnosis: Same   Procedure: Upper endoscopy   Surgeon: Leighton Ruff. Mozell Haber M.D., FACS   Anesthesia: Gen.   Indications for procedure: 80 year old Caucasian male Is being taken urgently to the operating room by Dr. Donne Hazel will for exploratory laparotomy because of perforated viscus. On his preoperative CT scan there was concern that there may be filling defect suspicious for mass at the gastric antrum or pylorus which may be the etiology for the perforated ulcer. An interoperative upper endoscopy was requested to evaluate the pylorus and proximal duodenum and stomach to exclude a mass  Description of procedure: Please see Dr. Cristal Generous operative note. After confirming the location of a perforated duodenal ulcer which was in the Superior lateral aspect of D1, I scrubbed out and obtained the Olympus endoscope in place it into the patient's mouth and advanced it into his oropharynx and down his esophagus into the stomach. The stomach for the most part were normal. There is no evidence of gastritis or mucosal ulceration. The pylorus itself looked a little bit inflamed however there is no evidence of mass in the antrum or at the level of pylorus. The endoscope was advanced through the pylorus into the first section the duodenum. The perforation was visible endoscopically. The perforation was approximately 2-3 mm in size. The edges of the perforation appeared healthy. There is no evidence of necrosis. The scope was pulled back into the stomach and the stomach was decompressed and the scope was withdrawn. Please see Dr Cristal Generous operative  note for the remainder of the surgery   Leighton Ruff. Redmond Pulling, MD, FACS General, Bariatric, & Minimally Invasive [facility], [place]

## 2013-08-28 NOTE — Progress Notes (Signed)
Returned to ICU s/p lap on vent. Just off pressors. Still sedated.  Impression  Acute resp failure s/p exploratory lap for per duodenal ulcer w/ repair and G tube placement Severe sepsis in setting of peritonitis  GOLD stage II/ class B COPD OSA Remote PE (2013)  Plan -rest on vent overnight -vent bundle -PAD protocol -scheduled BDs and ICS -f/u cxr and abg -cont ABX -wound rx per surg -assess for readiness to wean in am.   Marni Griffon ACNP-BC [place] Pager # [phone] OR # [phone] if no answer

## 2013-08-28 NOTE — Progress Notes (Signed)
ANTIBIOTIC CONSULT NOTE - INITIAL  Pharmacy Consult for Zosyn Indication: Sepsis  Allergies  Allergen Reactions  . Mometasone Furoate Other (See Comments)    CAUSED RED,ITCHY EYES; "lost my eyelashes" and Omnaris--nose bleed.  . Naproxen Sodium Nausea And Vomiting    "thought I was going to die"  . Anoro Ellipta [Umeclidinium-Vilanterol]     Double vision  . [name] [Aclidinium Bromide]     Urination issues   Patient Measurements: Height: 5\' 8"  (172.7 cm) Weight: 172 lb (78.019 kg) IBW/kg (Calculated) : 68.4  Vital Signs: Temp: 98.2 F (36.8 C) (03/31 1521) Temp src: Oral (03/31 1521) BP: 132/71 mmHg (03/31 1122) Pulse Rate: 93 (03/31 1521)  Total I/O In: 2654 [I.V.:1922; Blood:482; IV Piggyback:250] Out: 700 [Urine:400; Blood:300]  Labs:  Recent Labs  08/26/13 1701 08/28/13 0443  WBC 14.3* 14.8*  HGB 17.2* 15.5  PLT 231 230  CREATININE 1.58* 1.25   Estimated Creatinine Clearance: 47.1 ml/min (by C-G formula based on Cr of 1.25).  Microbiology: No results found for this or any previous visit (from the past 720 hour(s)).  Medical History: Past Medical History  Diagnosis Date  . Prostate cancer 1992    in remission.  s/p XRT  . Emphysema   . Allergic rhinitis   . Asthma   . COPD (chronic obstructive pulmonary disease)   . Recurrent upper respiratory infection (URI)     coughing from chronic sinusitis   . Normal cardiac stress test     reports stress test was several yrs. ago /w [employer]. Dr. Rogelia Mire, WNL   . OSA (obstructive sleep apnea) 2007    CPAP- report of settings on paper chart, seen by [facility] - seen 07/2011   . Renal stone     some passed spontaneous, & also had cystoscopy  . Arthritis     hands  . Neuromuscular disorder     reports shaking, somedays "can't hold spoon" - generalized  shakiness, is going to talk to MD about it at next appt.   . High cholesterol   . Shortness of breath on exertion   . H/O hiatal hernia   .  Osteoarthritis, generalized   . Chronic renal insufficiency, stage III (moderate)   . Mixed hyperlipidemia   . PE (pulmonary embolism) 10/2011   Medications:  Anti-infectives   Start     Dose/Rate Route Frequency Ordered Stop   08/28/13 1200  fluconazole (DIFLUCAN) IVPB 100 mg     100 mg 50 mL/hr over 60 Minutes Intravenous Every 24 hours 08/28/13 0927     08/28/13 1030  piperacillin-tazobactam (ZOSYN) IVPB 3.375 g     3.375 g 100 mL/hr over 30 Minutes Intravenous  Once 08/28/13 1025 08/28/13 1122   08/28/13 1000  piperacillin-tazobactam (ZOSYN) IVPB 3.375 g  Status:  Discontinued     3.375 g 12.5 mL/hr over 240 Minutes Intravenous 3 times per day 08/28/13 1610 08/28/13 1026     Assessment: 47 yom who is s/p exploratory lap for perforated duodenal ulcer.  He received one dose of IV piperacillin-tazobactam at ~ 10:30AM without noted complications.  He remains intubated and plans for potential weaning tomorrow.  We have been asked to continue his IV Zosyn due to necrosis and severe sepsis in the setting of his peritonitis.  His creatinine has trended down (1.58 > 1.25), and he has an estimated clearance of 47 ml/min.  This should provide adequate broad spectrum coverage for him.  Goal of Therapy:  Therapeutic response to IV antibiotics  Plan:  1.  Zosyn 3.375 gm IV every 8 hours. 2.  Monitor renal function and UOP 3.  F/U culture data and de-escalate as appropriate.  Rober Minion, PharmD., MS Clinical Pharmacist Pager:  [phone] Thank you for allowing pharmacy to be part of this patients care team. 08/28/2013,3:59 PM

## 2013-08-28 NOTE — ED Notes (Signed)
i stat lactic results given to Dr. Roxanne Mins by B. Yolanda Bonine, EMT

## 2013-08-28 NOTE — ED Provider Notes (Signed)
Care discussed with Dr. Roxanne Mins.  Patient seen and examined. History and physical exam reviewed.  Preliminary call from radiology shows extravasation of contrast in the upper abdomen. Working diagnosis of perforated duodenal ulcer. On exam he is tender with peritoneal irritation in his right upper abdomen. He is not tachycardic or hypotensive. He continues to [employer] well. He is anticoagulated with Coumadin for a distant history of PE 2 years ago. I've obtained consent for blood transfusion from he and wife. 2 units of FFP were ordered. I placed a call to our on-call general surgeon. This was returned immediately By Dr. Donne Hazel. Case was discussed. Patient will be seen by Dr. Callie Fielding surgery for admission.  Tanna Furry, MD 08/28/13 [phone]

## 2013-08-28 NOTE — ED Notes (Signed)
Patient transported to CT 

## 2013-08-28 NOTE — H&P (Signed)
Jon White May 12, 1933  [id number].   Primary Care MD: Dr. Ashby Dawes Primary Pulmonologist: Dr. Brand Males Chief Complaint/Reason for Consult: abdominal pain HPI: This is a very pleasant 80 yo white male who has a history of COPD due to tobacco abuse (quit 2 yrs ago), OSA (uses CPAP), h/o PE on coumadin who developed acute upper abdominal pain last night around midnight.  He admits to some nausea, but no emesis.  He denies fevers or chills.  He denies heartburn or reflux.  He had a normal BM yesterday with no blood.  He had a normal colonoscopy 10ish years ago.  He states he took ibuprofen for the first time in his life 2 days ago for some pain in his hand and had an allergic reaction.  It actually brought him to the [facility] for SOB.  He had a CTA at that time to rule out a PE.  He denies any other use of NSAIDs.  Due to continued severe pain, he came to the [facility] this morning.  He was noted to have a WBC of 14.8K and an INR of 2.24.  He had a CT of his abd/pel that revealed some esophageal thickening and free air likely c/w duodenal ulcer perforation.  We have been called to evaluate the patient for surgical intervention.  ROS: Please see HPI, otherwise all other systems have been reviewed and are negative.  Family History  Problem Relation Age of Onset  . Prostate cancer Brother   . Prostate cancer Brother   . Prostate cancer Brother   . Asthma Father   . Heart disease Mother     MI age 6  . Anesthesia problems Neg Hx     Past Medical History  Diagnosis Date  . Prostate cancer 1992    in remission.  s/p XRT  . Emphysema   . Allergic rhinitis   . Asthma   . COPD (chronic obstructive pulmonary disease)   . Recurrent upper respiratory infection (URI)     coughing from chronic sinusitis   . Normal cardiac stress test     reports stress test was several yrs. ago /w [employer]. Dr. Rogelia Mire, WNL   . OSA (obstructive sleep apnea) 2007    CPAP- report of settings on paper  chart, seen by [facility] - seen 07/2011   . Renal stone     some passed spontaneous, & also had cystoscopy  . Arthritis     hands  . Neuromuscular disorder     reports shaking, somedays "can't hold spoon" - generalized  shakiness, is going to talk to MD about it at next appt.   . High cholesterol   . Shortness of breath on exertion   . H/O hiatal hernia   . Osteoarthritis, generalized   . Chronic renal insufficiency, stage III (moderate)   . Mixed hyperlipidemia   . PE (pulmonary embolism) 10/2011    Past Surgical History  Procedure Laterality Date  . Lung surgery  1986    presumed from RLL. Segmentectomy. Benign Nodule.   . Sinus endo w/fusion  sch. 09/15/2011    Procedure: ENDOSCOPIC SINUS SURGERY WITH FUSION NAVIGATION;  Surgeon: Ascencion Dike, MD;  Location: [place];  Service: ENT;  Laterality: Bilateral;  bilateral maxillary antrostomy, bilateral endoscopic ethmoidectomy, bilateral sphenoidectomy, bilateral frontal recess exploration  . Septoplasty  09/15/11  . Cataract extraction w/ intraocular lens  implant, bilateral  ~ 2000  . Ureteroscopy  1980's  . Sinus endo w/fusion  09/15/2011  Procedure: ENDOSCOPIC SINUS SURGERY WITH FUSION NAVIGATION;  Surgeon: Ascencion Dike, MD;  Location: [place];  Service: ENT;  Laterality: Bilateral;  . Maxillary antrostomy  09/15/2011    Procedure: MAXILLARY ANTROSTOMY;  Surgeon: Ascencion Dike, MD;  Location: [facility] OR;  Service: ENT;  Laterality: Bilateral;  TOTAL MAXILLARY ANTROSTOMY  . Sinus exploration  09/15/2011    Procedure: SINUS EXPLORATION;  Surgeon: Ascencion Dike, MD;  Location: [facility] OR;  Service: ENT;  Laterality: Bilateral;  FRONTAL RECESS EXPLORATION  . Sphenoidectomy  09/15/2011    Procedure: Coralee Pesa;  Surgeon: Ascencion Dike, MD;  Location: [place];  Service: ENT;  Laterality: Bilateral;  . Septoplasty  09/15/2011    Procedure: SEPTOPLASTY;  Surgeon: Ascencion Dike, MD;  Location: [facility] OR;  Service: ENT;  Laterality: Bilateral;    Social  History:  reports that he quit smoking about 2 years ago. His smoking use included Cigarettes. He has a 60 pack-year smoking history. He has never used smokeless tobacco. He reports that he drinks alcohol. He reports that he does not use illicit drugs.  Allergies:  Allergies  Allergen Reactions  . Mometasone Furoate Other (See Comments)    CAUSED RED,ITCHY EYES; "lost my eyelashes" and Omnaris--nose bleed.  . Naproxen Sodium Nausea And Vomiting    "thought I was going to die"  . Anoro Ellipta [Umeclidinium-Vilanterol]     Double vision  . [name] [Aclidinium Bromide]     Urination issues     (Not in a hospital admission)  Blood pressure 149/75, pulse 86, temperature 99.4 F (37.4 C), temperature source Oral, resp. rate 26, height 5' 8" (1.727 m), weight 172 lb (78.019 kg), SpO2 95.00%. Physical Exam: General: pleasant, WD, WN white male who is laying in bed in mild distress at times due to pain, otherwise NAD HEENT: head is normocephalic, atraumatic.  Sclera are noninjected.  PERRL.  Ears and nose without any masses or lesions.  Mouth is pink and moist Heart: regular, rate, and rhythm.  Normal s1,s2. No obvious murmurs, gallops, or rubs noted.  Palpable radial and pedal pulses bilaterally Lungs: CTAB, no wheezes, rhonchi, or rales noted.  Respiratory effort mildly labored due to pain.  O2 in place. Abd: tense, tender to palpation mildly in RLQ and more severe in the upper abdomen.  No rebounding, but he does have peritoneal signs and tympany.  Absent BS. no masses, hernias, or organomegaly MS: all 4 extremities are symmetrical with no cyanosis, clubbing.  Trace lower extremity edema Skin: warm and dry with no masses, lesions, or rashes Psych: A&Ox3 with an appropriate affect.    Results for orders placed during the hospital encounter of 08/28/13 (from the past 48 hour(s))  CBC WITH DIFFERENTIAL     Status: Abnormal   Collection Time    08/28/13  4:43 AM      Result Value Ref Range    WBC 14.8 (*) 4.0 - 10.5 K/uL   RBC 4.87  4.22 - 5.81 MIL/uL   Hemoglobin 15.5  13.0 - 17.0 g/dL   HCT 44.6  39.0 - 52.0 %   MCV 91.6  78.0 - 100.0 fL   MCH 31.8  26.0 - 34.0 pg   MCHC 34.8  30.0 - 36.0 g/dL   RDW 15.6 (*) 11.5 - 15.5 %   Platelets 230  150 - 400 K/uL   Neutrophils Relative % 78 (*) 43 - 77 %   Neutro Abs 11.5 (*) 1.7 - 7.7 K/uL   Lymphocytes  Relative 13  12 - 46 %   Lymphs Abs 1.9  0.7 - 4.0 K/uL   Monocytes Relative 9  3 - 12 %   Monocytes Absolute 1.4 (*) 0.1 - 1.0 K/uL   Eosinophils Relative 0  0 - 5 %   Eosinophils Absolute 0.0  0.0 - 0.7 K/uL   Basophils Relative 0  0 - 1 %   Basophils Absolute 0.0  0.0 - 0.1 K/uL  COMPREHENSIVE METABOLIC PANEL     Status: Abnormal   Collection Time    08/28/13  4:43 AM      Result Value Ref Range   Sodium 142  137 - 147 mEq/L   Potassium 4.3  3.7 - 5.3 mEq/L   Chloride 103  96 - 112 mEq/L   CO2 24  19 - 32 mEq/L   Glucose, Bld 122 (*) 70 - 99 mg/dL   BUN 25 (*) 6 - 23 mg/dL   Creatinine, Ser 1.25  0.50 - 1.35 mg/dL   Calcium 9.0  8.4 - 10.5 mg/dL   Total Protein 7.1  6.0 - 8.3 g/dL   Albumin 3.4 (*) 3.5 - 5.2 g/dL   AST 28  0 - 37 U/L   ALT 24  0 - 53 U/L   Alkaline Phosphatase 81  39 - 117 U/L   Total Bilirubin 0.4  0.3 - 1.2 mg/dL   GFR calc non Af Amer 53 (*) >90 mL/min   GFR calc Af Amer 62 (*) >90 mL/min   Comment: (NOTE)     The eGFR has been calculated using the CKD EPI equation.     This calculation has not been validated in all clinical situations.     eGFR's persistently <90 mL/min signify possible Chronic Kidney     Disease.  LIPASE, BLOOD     Status: None   Collection Time    08/28/13  4:43 AM      Result Value Ref Range   Lipase 17  11 - 59 U/L  PROTIME-INR     Status: Abnormal   Collection Time    08/28/13  4:43 AM      Result Value Ref Range   Prothrombin Time 24.1 (*) 11.6 - 15.2 seconds   INR 2.24 (*) 0.00 - 1.49  I-STAT CG4 LACTIC ACID, ED     Status: Abnormal   Collection Time     08/28/13  5:09 AM      Result Value Ref Range   Lactic Acid, Venous 2.54 (*) 0.5 - 2.2 mmol/L  TYPE AND SCREEN     Status: None   Collection Time    08/28/13  8:21 AM      Result Value Ref Range   ABO/RH(D) A POS     Antibody Screen NEG     Sample Expiration 08/31/2013    PREPARE FRESH FROZEN PLASMA     Status: None   Collection Time    08/28/13  8:40 AM      Result Value Ref Range   Unit Number [id number]     Blood Component Type THAWED PLASMA     Unit division 00     Status of Unit ISSUED     Transfusion Status OK TO TRANSFUSE     Unit Number [id number]     Blood Component Type THAWED PLASMA     Unit division 00     Status of Unit ALLOCATED     Transfusion Status OK TO TRANSFUSE  Dg Chest 2 View  08/26/2013   CLINICAL DATA:  Shortness of breath. Hiatus and possible allergic reaction. COPD. Right lower lobectomy 20 years ago.  EXAM: CHEST  2 VIEW  COMPARISON:  CT ANGIO CHEST W/CM &/OR WO/CM dated 05/19/2012; DG CHEST 2 VIEW dated 08/05/2011  FINDINGS: Severe emphysema. Prior right lung surgery with osteotomy of the right sixth rib. Cardiac and mediastinal margins appear normal.  No pleural effusion.  The lungs appear otherwise clear.  IMPRESSION: 1. Severe emphysema. 2. Postoperative findings in the right chest.   Electronically Signed   By: Sherryl Barters M.D.   On: 08/26/2013 17:42   Ct Angio Chest W/cm &/or Wo Cm  08/26/2013   CLINICAL DATA:  Short of breath.  EXAM: CT ANGIOGRAPHY CHEST WITH CONTRAST  TECHNIQUE: Multidetector CT imaging of the chest was performed using the standard protocol during bolus administration of intravenous contrast. Multiplanar CT image reconstructions and MIPs were obtained to evaluate the vascular anatomy.  CONTRAST:  52m OMNIPAQUE IOHEXOL 350 MG/ML SOLN  COMPARISON:  Chest CT, 05/19/2012  FINDINGS: There is no evidence of a pulmonary embolus.  The heart is normal in size and configuration. There are mild coronary artery calcifications. The  great vessels are normal in caliber. There are multiple prominent mediastinal lymph nodes. The largest is a subcarinal lymph node to the right of the esophagus measuring 13.8 mm short axis. There is mild hilar adenopathy. Reference measurement of a left hilar node is 1 cm in short axis. There is a small hypoattenuating right thyroid nodule.  The esophagus appears thick-walled distally. This is nonspecific and similar to the prior exam. Consider esophagitis in the proper clinical setting.  Lungs show advanced changes of centrilobular emphysema. There is an anastomosis staple line along the lateral right lower lobe. Interstitial thickening is noted most evident in the bases. There is no lung consolidation. There are no lung masses or nodules. No pleural effusion.  Limited evaluation of the upper abdomen shows a small nonobstructing left intrarenal stone and a right renal cyst, stable.  There is a gap in the right posterior 6 rib that is likely postsurgical. This is chronic. No osteoblastic or osteolytic lesions.  Review of the MIP images confirms the above findings.  IMPRESSION: 1. No evidence of a pulmonary embolus. 2. No acute findings. 3. Advanced emphysema. 4. Mild mediastinal and hilar adenopathy, which is reactive and stable from the prior exam. 5. Wall of the distal esophagus appears thickened. Consider esophagitis in the proper clinical setting. 6. Status post wedge resection along the right lateral lower lobe, also stable.   Electronically Signed   By: DLajean ManesM.D.   On: 08/26/2013 20:28   Ct Abdomen Pelvis W Contrast  08/28/2013   CLINICAL DATA:  Abdominal pain.  EXAM: CT ABDOMEN AND PELVIS WITH CONTRAST  TECHNIQUE: Multidetector CT imaging of the abdomen and pelvis was performed using the standard protocol following bolus administration of intravenous contrast.  CONTRAST:  85 cc Omnipaque 300.  COMPARISON:  Chest CT 08/26/2013.  FINDINGS: New from the limited imaging of the upper abdomen performed  on 08/26/2013 is the presence of extra luminal contrast/fluid centered at the level of the proximal duodenum. Additionally, loculated gas and fluid collection extends into the porta hepatis. The proximal duodenum appears thickened with focal gas containing collection. Findings are suspicious for proximal duodenal ulceration with perforation. Within the gastric antrum/ pylorus, polypoid filling defect is noted. Mass at this level therefore cannot be completely excluded contributing to  the perforation.  Circumferential thickening of the distal esophagus. Although this may be related to reflux and esophagitis, mass not excluded.  Small amount of peri hepatic fluid is present.  Scattered colonic diverticula most notable sigmoid colon region. There is a tiny amount of fluid in the pelvis which I suspect is related to the duodenal perforation rather than diverticulitis.  Fatty containing inguinal hernias. Loop of small bowel extends towards the upper aspect the right inguinal hernia however, no bowel extends into the hernia.  Mild fatty infiltration of the liver suspected. No focal worrisome hepatic lesion. Tiny spleen without worrisome mass. No worrisome pancreatic or adrenal lesion.  Renal lesions larger ones which are cysts whereas some of the small ones are too small to adequately characterize. Lobulated contour or of the left kidney with renal parenchymal thinning may reflect result of prior episodes of obstruction. 3 mm nonobstructing upper pole left renal calculi. Mild fullness of the left renal collecting system on delayed imaging without point of obstruction noted.  Atherosclerotic type changes abdominal aorta without aneurysmal dilation. Atherosclerotic type changes abdominal aortic branch vessels including iliac arteries.  No adenopathy.  Mild degenerative changes lumbar spine without bony destructive lesion.  Sludge within gallbladder. Limited for evaluating for gallbladder inflammation by CT.  No obvious  bladder mass although evaluation limited. Calcified aspects of the prostate gland.  IMPRESSION: Findings are suspicious for duodenal ulceration with perforation as detailed above. Within the gastric antrum/ pylorus, polypoid filling defect is noted. Mass at this level therefore cannot be completely excluded contributing to the perforation.  Circumferential thickening of the distal esophagus. Although this may be related to reflux and esophagitis, mass not excluded.  Scattered colonic diverticula most notable sigmoid colon region. There is a tiny amount of fluid in the pelvis which I suspect is related to the duodenal perforation rather than diverticulitis.  Renal lesions larger ones which are cysts whereas some of the small ones are too small to adequately characterize. Lobulated contour or of the left kidney with renal parenchymal thinning may reflect result of prior episodes of obstruction. 3 mm nonobstructing upper pole left renal calculi. Mild fullness of the left renal collecting system on delayed imaging without point of obstruction noted.  Calcification portions of minimally asymmetric the prostate gland without adjacent adenopathy or sclerotic lesion detected to suggest spread of known prostate cancer. Correlation with PSA recommended.  These results were called by telephone at the time of interpretation on 08/28/2013 at 7:57 AM to Dr. Delora Fuel , who verbally acknowledged these results.   Electronically Signed   By: Chauncey Cruel M.D.   On: 08/28/2013 08:16       Assessment/Plan 1. Perforated viscus, likely duodenal ulcer, ? Mass 2. COPD, secondary to tobacco abuse 3. OSA 4. H/o PE on coumadin 5. Therapeutic INR 6. H/o prostate cancer 7. Hyperlipidemia   Plan: 1. We will plan on admission for the patient with emergent surgical intervention.  He will be given 2 units of FFP prior to surgery to reverse his INR.  He has been started on zosyn and fluconazole.  I have contacted CCM to assist [place]  with post operative management of possible ventilator vs COPD management.  Dr. Donne Hazel and I have thoroughly discussed the procedure along with possible risks and complications with the patient and his wife.  We have also discussed expected outcome.  They both understand and are agreeable to proceed with surgical intervention.  OSBORNE,KELLY E 08/28/2013, 9:40 AM Pager: [phone]

## 2013-08-28 NOTE — Op Note (Signed)
Preoperative diagnosis: Perforated duodenal ulcer Postoperative diagnosis: Same as above Procedure: #1 exploratory laparotomy with Jon White patch of perforated duodenal ulcer #224 French Malecot gastrostomy tube placement #3 EGD Surgeon: Dr. Serita White Asst.: Dr. Greer White Anesthesia: Gen. Estimated blood loss: 50 cc Drains: #119 Jon White drain that is the lateral drain posterior to the perforation #219 [name] drain to the medial drain anterior to the perforation Complications: None Sponge needle count was correct the completion Disposition to ICU stable  Indications: This is a 80 year old male who is on Coumadin for a prior history of pulmonary emboli who presents with a less than 24-hour history of upper abdominal pain. He is undergoing evaluation shows an elevated lactic acid, tenderness on his examination, and a CT scan consistent with appears to be a duodenal ulcer with a possible filling defect in what is pyloric region. I discussed with he and his wife goingto the operating room. He was administered 4 units FFP prior to going to the operating room.  Procedure: After informed consent was obtained the patient was taken to the operating room. He was given antibiotics and had sequential compression devices in place. His last INR was 1.79. He was receiving 2 additional units of FFP while we were beginning for this. He was placed under general anesthesia without complication. His abdomen was then prepped and draped in the standard sterile surgical fashion. A surgical timeout was then performed.  I made an upper midline incision which initially carried a little bit below his umbilicus. I ligated his falciform ligament. I then inserted a self-retaining retractor system. He was noted near his duodenum to have some bilious material. This was evacuated. Once I was able to expose this area there was a fair amount of necrotic tissue and was difficult to find the hole. We did a partial  rotation of his duodenum I was unable to really do this completely as I could not tell certain structures apart. I was nervous that I would cause an injury to his porta if I continued with this. This was due to the inflammation in this area. The colon was packed away. I eventually was able to identify the perforation in the anterolateral position almost right at the superior aspect of the duodenum with a very small hole that was leaking bile and air. Due to the fact that there was a potential mass on a CT scan Dr. Redmond White performed an EGD. There was noted to have a perforation but there was no mass present in this area. This also allowed [place] to confirm the position of the perforation. This is dictated separately as an operative report.  A nasogastric tube was placed into position. I then made a tongue of omentum off of the colon. I rotated this overlying where the perforation was. I then used 2-0 silk and patched this in a Graham patch fashion. This was secured upon completion. I placed a 48 [name] drain posterior and a 31 [name] drain anterior to this perforation. Most of the necrosis that we have seen ande inflammation was the surrounding tissue and not the actual perforation itself. I then elected to place a gastrostomy tube. I placed 2 pursestring 2-0 silk sutures. I then made a gastrotomy. A 24 French Malecot tube was then inserted. I then brought this to the skin on the left side. I then used 2-0 silk to secure this to the abdominal wall in a Stamm fashion. This was then secured. This ended well as the other 2  drains were then secured with 2-0 nylon sutures. I then closed with #1 loop PDS. The wound was left open and a dressing was placed due to the contamination. He tolerated this well will be transferred to the ICU intubated.

## 2013-08-28 NOTE — ED Notes (Signed)
MD at bedside. 

## 2013-08-28 NOTE — Consult Note (Signed)
Name: Jon White MRN: [id number] DOB: August 21, 1932    ADMISSION DATE:  08/28/2013 CONSULTATION DATE:  3/31  REFERRING MD :  Donne Hazel  PRIMARY SERVICE: General surgery   CHIEF COMPLAINT:  COPD/PE, assist with post-op management   BRIEF PATIENT DESCRIPTION:  80 yo former smoker, f/b MR for COPD Stage II/Class B and remote PE (still on coumadin), also KC for OSA (CPAP 12 cm H2O). Admitted on 3/31 w/ perforated viscus. PCCM asked to see pre/post-op given concern about his multiple pulmonary comorbids.   SIGNIFICANT EVENTS / STUDIES:  CT abd/pelvis 3/31: Findings are suspicious for duodenal ulceration with perforation as detailed above. Within the gastric antrum/ pylorus   LINES / TUBES: ET Tube 3/31>>>  CULTURES: Blood 3/31>>> Urine 3/31>>> Sputum 3/31>>>  ANTIBIOTICS: Zosyn 3/31>>> Diflucan 3/31>>>  HISTORY OF PRESENT ILLNESS:   This is a delightful 80 year old male who is followed by Dr Chase Caller at [facility] for COPD (last CAT score 12/ FEV1: 56%: GOLD st II/ Class B, was GOLD stage III before starting Tudorza) & PE. He also sees K Clance in our office for OSA (on pressure of 12 cmH20).Presented to the ER at [facility] w/ CC: severe abd pain and associated shortness of breath. ED evaluation showed WBC ct 14.8 CT abd/pelvis: esophageal thickening and free air in abd concerning for duodenal ulcer perforation. He was seen by surgery. INR was corrected w/ FFP. PCCM has been asked to evaluate and assist with his pulmonary management given concern about his underlying pulmonary co morbidities.   PAST MEDICAL HISTORY :  Past Medical History  Diagnosis Date  . Prostate cancer 1992    in remission.  s/p XRT  . Emphysema   . Allergic rhinitis   . Asthma   . COPD (chronic obstructive pulmonary disease)   . Recurrent upper respiratory infection (URI)     coughing from chronic sinusitis   . Normal cardiac stress test     reports stress test was several yrs. ago /w [employer].  Dr. Rogelia Mire, WNL   . OSA (obstructive sleep apnea) 2007    CPAP- report of settings on paper chart, seen by [facility] - seen 07/2011   . Renal stone     some passed spontaneous, & also had cystoscopy  . Arthritis     hands  . Neuromuscular disorder     reports shaking, somedays "can't hold spoon" - generalized  shakiness, is going to talk to MD about it at next appt.   . High cholesterol   . Shortness of breath on exertion   . H/O hiatal hernia   . Osteoarthritis, generalized   . Chronic renal insufficiency, stage III (moderate)   . Mixed hyperlipidemia   . PE (pulmonary embolism) 10/2011   Past Surgical History  Procedure Laterality Date  . Lung surgery  1986    presumed from RLL. Segmentectomy. Benign Nodule.   . Sinus endo w/fusion  sch. 09/15/2011    Procedure: ENDOSCOPIC SINUS SURGERY WITH FUSION NAVIGATION;  Surgeon: Ascencion Dike, MD;  Location: [place];  Service: ENT;  Laterality: Bilateral;  bilateral maxillary antrostomy, bilateral endoscopic ethmoidectomy, bilateral sphenoidectomy, bilateral frontal recess exploration  . Septoplasty  09/15/11  . Cataract extraction w/ intraocular lens  implant, bilateral  ~ 2000  . Ureteroscopy  1980's  . Sinus endo w/fusion  09/15/2011    Procedure: ENDOSCOPIC SINUS SURGERY WITH FUSION NAVIGATION;  Surgeon: Ascencion Dike, MD;  Location: [place];  Service: ENT;  Laterality: Bilateral;  . Maxillary antrostomy  09/15/2011    Procedure: MAXILLARY ANTROSTOMY;  Surgeon: Ascencion Dike, MD;  Location: [facility] OR;  Service: ENT;  Laterality: Bilateral;  TOTAL MAXILLARY ANTROSTOMY  . Sinus exploration  09/15/2011    Procedure: SINUS EXPLORATION;  Surgeon: Ascencion Dike, MD;  Location: [facility] OR;  Service: ENT;  Laterality: Bilateral;  FRONTAL RECESS EXPLORATION  . Sphenoidectomy  09/15/2011    Procedure: Coralee Pesa;  Surgeon: Ascencion Dike, MD;  Location: [place];  Service: ENT;  Laterality: Bilateral;  . Septoplasty  09/15/2011    Procedure: SEPTOPLASTY;   Surgeon: Ascencion Dike, MD;  Location: [address];  Service: ENT;  Laterality: Bilateral;   Prior to Admission medications   Medication Sig Start Date End Date Taking? Authorizing Provider  Aclidinium Bromide (TUDORZA PRESSAIR) 400 MCG/ACT AEPB Inhale 1 puff into the lungs 2 (two) times daily.   Yes Historical Provider, MD  albuterol (VENTOLIN HFA) 108 (90 BASE) MCG/ACT inhaler Inhale 2 puffs into the lungs every 6 (six) hours as needed for wheezing or shortness of breath.    Yes Historical Provider, MD  atorvastatin (LIPITOR) 10 MG tablet Take 10 mg by mouth daily.    Yes Historical Provider, MD  budesonide-formoterol (SYMBICORT) 160-4.5 MCG/ACT inhaler Inhale 2 puffs into the lungs 2 (two) times daily. 11/01/12  Yes Brand Males, MD  Calcium Carbonate-Vitamin D (CALTRATE 600+D) 600-400 MG-UNIT per tablet Take 1 tablet by mouth 2 (two) times daily. With meals.   Yes Historical Provider, MD  cetirizine (ZYRTEC) 10 MG tablet Take 10 mg by mouth daily as needed for allergies.    Yes Historical Provider, MD  famotidine (PEPCID) 20 MG tablet Take 20 mg by mouth 2 (two) times daily as needed for heartburn or indigestion.   Yes Historical Provider, MD  fluocinonide cream (LIDEX) 8.09 % Apply 1 application topically daily. To infected area.   Yes Historical Provider, MD  fluticasone (FLONASE) 50 MCG/ACT nasal spray Place 2 sprays into the nose daily. Each nostril once a day.   Yes Historical Provider, MD  Multiple Vitamin (MULTIVITAMIN WITH MINERALS) TABS tablet Take 1 tablet by mouth daily.   Yes Historical Provider, MD  warfarin (COUMADIN) 4 MG tablet Take 4 mg by mouth every evening.   Yes Historical Provider, MD   Allergies  Allergen Reactions  . Mometasone Furoate Other (See Comments)    CAUSED RED,ITCHY EYES; "lost my eyelashes" and Omnaris--nose bleed.  . Naproxen Sodium Nausea And Vomiting    "thought I was going to die"  . Anoro Ellipta [Umeclidinium-Vilanterol]     Double vision  . [name]  [Aclidinium Bromide]     Urination issues    FAMILY HISTORY:  Family History  Problem Relation Age of Onset  . Prostate cancer Brother   . Prostate cancer Brother   . Prostate cancer Brother   . Asthma Father   . Heart disease Mother     MI age 1  . Anesthesia problems Neg Hx    SOCIAL HISTORY:  reports that he quit smoking about 2 years ago. His smoking use included Cigarettes. He has a 60 pack-year smoking history. He has never used smokeless tobacco. He reports that he drinks alcohol. He reports that he does not use illicit drugs.  Review of Systems:   Bolds are positive  Constitutional: weight loss, gain, night sweats, Fevers, chills, fatigue .  HEENT: headaches, Sore throat, sneezing, nasal congestion, post nasal drip, Difficulty swallowing, Tooth/dental  problems, visual complaints visual changes, ear ache CV:  chest pain, radiates: ,Orthopnea, PND, swelling in lower extremities, dizziness, palpitations, syncope.  GI  heartburn, indigestion, abdominal pain, severe, affecting breathing 10/10, nausea, vomiting, diarrhea, change in bowel habits, loss of appetite, bloody stools.  Resp: cough, productive: , hemoptysis, dyspnea, associated w/ abd pain, chest pain, pleuritic.  Skin: rash or itching or icterus GU: dysuria, change in color of urine, urgency or frequency. flank pain, hematuria  MS: joint pain or swelling. decreased range of motion  Psych: change in mood or affect. depression or anxiety.  Neuro: difficulty with speech, weakness, numbness, ataxia    SUBJECTIVE:  C/o abd pain  VITAL SIGNS: Temp:  [98.1 F (36.7 C)-99.6 F (37.6 C)] 99.6 F (37.6 C) (03/31 1033) Pulse Rate:  [69-88] 88 (03/31 1017) Resp:  [18-26] 21 (03/31 1017) BP: (118-151)/(72-83) 139/81 mmHg (03/31 1000) SpO2:  [93 %-99 %] 96 % (03/31 1017) Weight:  [78.019 kg (172 lb)] 78.019 kg (172 lb) (03/31 0525) HEMODYNAMICS:   VENTILATOR SETTINGS:   INTAKE / OUTPUT: Intake/Output   None      PHYSICAL EXAMINATION: General:  80 year old white male. Guarding abd. Not in acute distress, but not comfortable  Neuro:  Awake, alert, no focal def HEENT:  [place], no JVD  Cardiovascular:  rrr Lungs: Fine Crackles posterior bases, + upper airway wheeze  Abdomen:  Rigid, tender to gentle palp, hypoactive bowel sounds  Musculoskeletal:  Intact  Skin:  Dry and intact   LABS:  CBC  Recent Labs Lab 08/26/13 1701 08/28/13 0443  WBC 14.3* 14.8*  HGB 17.2* 15.5  HCT 49.4 44.6  PLT 231 230   Coag's  Recent Labs Lab 08/26/13 1701 08/28/13 0443  INR 1.68* 2.24*   BMET  Recent Labs Lab 08/26/13 1701 08/28/13 0443  NA 143 142  K 4.5 4.3  CL 101 103  CO2 22 24  BUN 28* 25*  CREATININE 1.58* 1.25  GLUCOSE 166* 122*   Electrolytes  Recent Labs Lab 08/26/13 1701 08/28/13 0443  CALCIUM 9.3 9.0   Sepsis Markers  Recent Labs Lab 08/28/13 0509  LATICACIDVEN 2.54*   ABG No results found for this basename: PHART, PCO2ART, PO2ART,  in the last 168 hours Liver Enzymes  Recent Labs Lab 08/28/13 0443  AST 28  ALT 24  ALKPHOS 81  BILITOT 0.4  ALBUMIN 3.4*   Cardiac Enzymes No results found for this basename: TROPONINI, PROBNP,  in the last 168 hours Glucose No results found for this basename: GLUCAP,  in the last 168 hours  Imaging Dg Chest 2 View  08/26/2013   CLINICAL DATA:  Shortness of breath. Hiatus and possible allergic reaction. COPD. Right lower lobectomy 20 years ago.  EXAM: CHEST  2 VIEW  COMPARISON:  CT ANGIO CHEST W/CM &/OR WO/CM dated 05/19/2012; DG CHEST 2 VIEW dated 08/05/2011  FINDINGS: Severe emphysema. Prior right lung surgery with osteotomy of the right sixth rib. Cardiac and mediastinal margins appear normal.  No pleural effusion.  The lungs appear otherwise clear.  IMPRESSION: 1. Severe emphysema. 2. Postoperative findings in the right chest.   Electronically Signed   By: Sherryl Barters M.D.   On: 08/26/2013 17:42   Ct Angio Chest W/cm &/or  Wo Cm  08/26/2013   CLINICAL DATA:  Short of breath.  EXAM: CT ANGIOGRAPHY CHEST WITH CONTRAST  TECHNIQUE: Multidetector CT imaging of the chest was performed using the standard protocol during bolus administration of intravenous contrast. Multiplanar CT image  reconstructions and MIPs were obtained to evaluate the vascular anatomy.  CONTRAST:  57mL OMNIPAQUE IOHEXOL 350 MG/ML SOLN  COMPARISON:  Chest CT, 05/19/2012  FINDINGS: There is no evidence of a pulmonary embolus.  The heart is normal in size and configuration. There are mild coronary artery calcifications. The great vessels are normal in caliber. There are multiple prominent mediastinal lymph nodes. The largest is a subcarinal lymph node to the right of the esophagus measuring 13.8 mm short axis. There is mild hilar adenopathy. Reference measurement of a left hilar node is 1 cm in short axis. There is a small hypoattenuating right thyroid nodule.  The esophagus appears thick-walled distally. This is nonspecific and similar to the prior exam. Consider esophagitis in the proper clinical setting.  Lungs show advanced changes of centrilobular emphysema. There is an anastomosis staple line along the lateral right lower lobe. Interstitial thickening is noted most evident in the bases. There is no lung consolidation. There are no lung masses or nodules. No pleural effusion.  Limited evaluation of the upper abdomen shows a small nonobstructing left intrarenal stone and a right renal cyst, stable.  There is a gap in the right posterior 6 rib that is likely postsurgical. This is chronic. No osteoblastic or osteolytic lesions.  Review of the MIP images confirms the above findings.  IMPRESSION: 1. No evidence of a pulmonary embolus. 2. No acute findings. 3. Advanced emphysema. 4. Mild mediastinal and hilar adenopathy, which is reactive and stable from the prior exam. 5. Wall of the distal esophagus appears thickened. Consider esophagitis in the proper clinical setting.  6. Status post wedge resection along the right lateral lower lobe, also stable.   Electronically Signed   By: Lajean Manes M.D.   On: 08/26/2013 20:28   Ct Abdomen Pelvis W Contrast  08/28/2013   CLINICAL DATA:  Abdominal pain.  EXAM: CT ABDOMEN AND PELVIS WITH CONTRAST  TECHNIQUE: Multidetector CT imaging of the abdomen and pelvis was performed using the standard protocol following bolus administration of intravenous contrast.  CONTRAST:  85 cc Omnipaque 300.  COMPARISON:  Chest CT 08/26/2013.  FINDINGS: New from the limited imaging of the upper abdomen performed on 08/26/2013 is the presence of extra luminal contrast/fluid centered at the level of the proximal duodenum. Additionally, loculated gas and fluid collection extends into the porta hepatis. The proximal duodenum appears thickened with focal gas containing collection. Findings are suspicious for proximal duodenal ulceration with perforation. Within the gastric antrum/ pylorus, polypoid filling defect is noted. Mass at this level therefore cannot be completely excluded contributing to the perforation.  Circumferential thickening of the distal esophagus. Although this may be related to reflux and esophagitis, mass not excluded.  Small amount of peri hepatic fluid is present.  Scattered colonic diverticula most notable sigmoid colon region. There is a tiny amount of fluid in the pelvis which I suspect is related to the duodenal perforation rather than diverticulitis.  Fatty containing inguinal hernias. Loop of small bowel extends towards the upper aspect the right inguinal hernia however, no bowel extends into the hernia.  Mild fatty infiltration of the liver suspected. No focal worrisome hepatic lesion. Tiny spleen without worrisome mass. No worrisome pancreatic or adrenal lesion.  Renal lesions larger ones which are cysts whereas some of the small ones are too small to adequately characterize. Lobulated contour or of the left kidney with renal  parenchymal thinning may reflect result of prior episodes of obstruction. 3 mm nonobstructing upper pole left renal calculi.  Mild fullness of the left renal collecting system on delayed imaging without point of obstruction noted.  Atherosclerotic type changes abdominal aorta without aneurysmal dilation. Atherosclerotic type changes abdominal aortic branch vessels including iliac arteries.  No adenopathy.  Mild degenerative changes lumbar spine without bony destructive lesion.  Sludge within gallbladder. Limited for evaluating for gallbladder inflammation by CT.  No obvious bladder mass although evaluation limited. Calcified aspects of the prostate gland.  IMPRESSION: Findings are suspicious for duodenal ulceration with perforation as detailed above. Within the gastric antrum/ pylorus, polypoid filling defect is noted. Mass at this level therefore cannot be completely excluded contributing to the perforation.  Circumferential thickening of the distal esophagus. Although this may be related to reflux and esophagitis, mass not excluded.  Scattered colonic diverticula most notable sigmoid colon region. There is a tiny amount of fluid in the pelvis which I suspect is related to the duodenal perforation rather than diverticulitis.  Renal lesions larger ones which are cysts whereas some of the small ones are too small to adequately characterize. Lobulated contour or of the left kidney with renal parenchymal thinning may reflect result of prior episodes of obstruction. 3 mm nonobstructing upper pole left renal calculi. Mild fullness of the left renal collecting system on delayed imaging without point of obstruction noted.  Calcification portions of minimally asymmetric the prostate gland without adjacent adenopathy or sclerotic lesion detected to suggest spread of known prostate cancer. Correlation with PSA recommended.  These results were called by telephone at the time of interpretation on 08/28/2013 at 7:57 AM to Dr.  Delora Fuel , who verbally acknowledged these results.   Electronically Signed   By: Chauncey Cruel M.D.   On: 08/28/2013 08:16     CXR: see above, no acute pulm process on 3/29 cxr, CT abd cuts lung component unremarkable   ASSESSMENT / PLAN:  PULMONARY A:  COPD (stage II/Class B) Remote PE (2013: provoked by long travel to [place], still maintained on coumadin given freq long travel) OSA (maintained on CPAP 12 cmH20) >he has been doing well from pulmonary standpoint but the addition of abd pain and resultant decreased abd compliance always presents a challenge to people w/ underlying airflow limitations. He will be at high risk for prolonged need for mechanical ventilation and post-op pulmonary complications. None-the-less needs emergent surgery.  P:   -would recover in ICU regardless if extubated in PACU or not.  -will schedule brovana and budesonide for now w/ daily [name] (if able to take) -hold coumadin. Start heparin/coumadin bridge when ok with surgery post-op -post-op analgesia -pulm hygiene measures -CPAP at HS when extubated. If has nasogastric tube this may be an issue.  -cont pulse ox   CARDIOVASCULAR A: SIRS/sepsis due to perf abd viscous P:  IVFs. Avoid hypotension Ck lactate See ID section   RENAL A:  No acute  P:   Avoid hypotension Cont IVFs F/u chemistry   GASTROINTESTINAL A:   Perforated abd viscous, Likely perforated duodenal ulcer.  GERD  h/o esophageal strictures  P:   PPI  NPO For emergent exploratory lap  HEMATOLOGIC A:  supra therapeutic INR (coumadin): INR corrected in ER w/ FFP       Remote PE (2013)      Remote (>20 yrs ago) h/o prostate CA  P:  PAS Trend CBC Will defer to surgery as to timing to resume therapeutic heparin/coumadin. No rush as he PE was provoked in past and Dr Chase Caller was treating w/ goal INR >1.5 to  decrease risk of future PE, no longer actively treating acute PE.   INFECTIOUS A:  abd sepsis  P:   Cont zosyn &  diflucan  Needs washout of abd  ENDOCRINE A:  No acute  P:   Trend cbg ssi IF glucose remain > 150   NEUROLOGIC A:  Pain  P:   PRN analgesia   TODAY'S SUMMARY: He is high risk for prolonged ventilation and post-op pulmonary complications, however he needs surgery and think his dyspnea is worse than baseline, is more related to his acute abd process than his underlying airflow limitations. Have adjusted his broncho-dilator regimen. He should recover in the ICU regardless if extubated in PACU or not. We will follow along.   Marni Griffon, NP  Upon my evaluation of the patient, he ad exited the OR and now is sedated and intubated, physical exam different only in that the patient is sedated and intubated, coarse BS diffusely, paralyzed so no longer guarding.  Vent setting were adjusted, will wait til AM to extubate unless wakes up and follows command and is weaning great.  Agree with abx use, will f/u on cultures.  With regards to the PE, coumadin has been held for now, will not start heparin until ok with surgery.  I have personally obtained a history, examined the patient, evaluated laboratory and imaging results, formulated the assessment and plan and placed orders.  CRITICAL CARE: The patient is critically ill with multiple organ systems failure and requires high complexity decision making for assessment and support, frequent evaluation and titration of therapies, application of advanced monitoring technologies and extensive interpretation of multiple databases. Critical Care Time devoted to patient care services described in this note is 30 minutes.   Rush Farmer, M.D. [facility] Pulmonary/Critical Care Medicine. Pager: [phone]. After hours pager: [phone].

## 2013-08-28 NOTE — Transfer of Care (Signed)
Immediate Anesthesia Transfer of Care Note  Patient: Jon White  Procedure(s) Performed: Procedure(s): EXPLORATORY LAPAROTOMY (N/A) ESOPHAGOGASTRODUODENOSCOPY (EGD) (N/A) DUODENAL ULCER PATCH (N/A) GASTROSTOMY (N/A)  Patient Location: ICU  Anesthesia Type:General  Level of Consciousness: sedated and Patient remains intubated per anesthesia plan  Airway & Oxygen Therapy: Patient remains intubated per anesthesia plan and Patient placed on Ventilator (see vital sign flow sheet for setting)  Post-op Assessment: Report given to PACU RN and Post -op Vital signs reviewed and stable  Post vital signs: Reviewed and stable  Complications: No apparent anesthesia complications

## 2013-08-28 NOTE — ED Notes (Signed)
PT here for abd pain, sts onset Sunday and has been intermittent, sts did take motrin on Sunday on an empty stomach.

## 2013-08-28 NOTE — ED Notes (Signed)
[place], MD at bedside 

## 2013-08-28 NOTE — ED Provider Notes (Signed)
CSN: [id number]     Arrival date & time 08/28/13  0417 History   First MD Initiated Contact with Patient 08/28/13 [phone]     Chief Complaint  Patient presents with  . Abdominal Pain     (Consider location/radiation/quality/duration/timing/severity/associated sxs/prior Treatment) Patient is a 80 y.o. male presenting with abdominal pain. The history is provided by the patient.  Abdominal Pain He has been having severe, sharp pain in the periumbilical area since about midnight. Yesterday, there was some mild fullness in the abdomen which has resolved. His sharp pain is worse when he has a headache up and he states that he has been having chronic problems with hiccups. There is associated nausea but no vomiting. There's no diarrhea. There's been no fever, chills, sweats. Nothing makes the pain any better. When he has a headache up, the pain will be as severe as 10/10 but when at rest pain is proximally 6/10. Of note, he had been in the emergency department 2 days ago because of an episode of hives and had a CT of his chest done at that time. He does have history of pulmonary embolism and is anticoagulated on warfarin.  Past Medical History  Diagnosis Date  . Prostate cancer 1992    in remission.  s/p XRT  . Emphysema   . Allergic rhinitis   . Asthma   . COPD (chronic obstructive pulmonary disease)   . Recurrent upper respiratory infection (URI)     coughing from chronic sinusitis   . Normal cardiac stress test     reports stress test was several yrs. ago /w [employer]. Dr. Rogelia Mire, WNL   . OSA (obstructive sleep apnea) 2007    CPAP- report of settings on paper chart, seen by [facility] - seen 07/2011   . Renal stone     some passed spontaneous, & also had cystoscopy  . Arthritis     hands  . Neuromuscular disorder     reports shaking, somedays "can't hold spoon" - generalized  shakiness, is going to talk to MD about it at next appt.   . High cholesterol   . Shortness of breath on exertion    . H/O hiatal hernia   . Osteoarthritis, generalized   . Chronic renal insufficiency, stage III (moderate)   . Mixed hyperlipidemia   . PE (pulmonary embolism) 10/2011   Past Surgical History  Procedure Laterality Date  . Lung surgery  1986    presumed from RLL. Segmentectomy. Benign Nodule.   . Sinus endo w/fusion  sch. 09/15/2011    Procedure: ENDOSCOPIC SINUS SURGERY WITH FUSION NAVIGATION;  Surgeon: Ascencion Dike, MD;  Location: [place];  Service: ENT;  Laterality: Bilateral;  bilateral maxillary antrostomy, bilateral endoscopic ethmoidectomy, bilateral sphenoidectomy, bilateral frontal recess exploration  . Septoplasty  09/15/11  . Cataract extraction w/ intraocular lens  implant, bilateral  ~ 2000  . Ureteroscopy  1980's  . Sinus endo w/fusion  09/15/2011    Procedure: ENDOSCOPIC SINUS SURGERY WITH FUSION NAVIGATION;  Surgeon: Ascencion Dike, MD;  Location: [place];  Service: ENT;  Laterality: Bilateral;  . Maxillary antrostomy  09/15/2011    Procedure: MAXILLARY ANTROSTOMY;  Surgeon: Ascencion Dike, MD;  Location: [facility] OR;  Service: ENT;  Laterality: Bilateral;  TOTAL MAXILLARY ANTROSTOMY  . Sinus exploration  09/15/2011    Procedure: SINUS EXPLORATION;  Surgeon: Ascencion Dike, MD;  Location: [facility] OR;  Service: ENT;  Laterality: Bilateral;  FRONTAL RECESS EXPLORATION  . Sphenoidectomy  09/15/2011    Procedure: Coralee Pesa;  Surgeon: Ascencion Dike, MD;  Location: [place];  Service: ENT;  Laterality: Bilateral;  . Septoplasty  09/15/2011    Procedure: SEPTOPLASTY;  Surgeon: Ascencion Dike, MD;  Location: [facility] OR;  Service: ENT;  Laterality: Bilateral;   Family History  Problem Relation Age of Onset  . Prostate cancer Brother   . Prostate cancer Brother   . Prostate cancer Brother   . Asthma Father   . Heart disease Mother     MI age 8  . Anesthesia problems Neg Hx    History  Substance Use Topics  . Smoking status: Former Smoker -- 1.00 packs/day for 60 years    Types: Cigarettes    Quit  date: 08/06/2011  . Smokeless tobacco: Never Used  . Alcohol Use: 2.4 oz/week    4 Cans of beer per week     Comment: 09/15/11 "during summer"    Review of Systems  Gastrointestinal: Positive for abdominal pain.  All other systems reviewed and are negative.      Allergies  Mometasone furoate; Naproxen sodium; Anoro ellipta; and Tudorza  Home Medications   Current Outpatient Rx  Name  Route  Sig  Dispense  Refill  . Aclidinium Bromide (TUDORZA PRESSAIR) 400 MCG/ACT AEPB   Inhalation   Inhale 1 puff into the lungs 2 (two) times daily.         [name] Kitchen albuterol (VENTOLIN HFA) 108 (90 BASE) MCG/ACT inhaler   Inhalation   Inhale 2 puffs into the lungs every 6 (six) hours as needed for wheezing or shortness of breath.          [name] Kitchen atorvastatin (LIPITOR) 10 MG tablet   Oral   Take 10 mg by mouth daily.          . budesonide-formoterol (SYMBICORT) 160-4.5 MCG/ACT inhaler   Inhalation   Inhale 2 puffs into the lungs 2 (two) times daily.   2 Inhaler   0   . Calcium Carbonate-Vitamin D (CALTRATE 600+D) 600-400 MG-UNIT per tablet   Oral   Take 1 tablet by mouth 2 (two) times daily. With meals.         . cetirizine (ZYRTEC) 10 MG tablet   Oral   Take 10 mg by mouth daily as needed for allergies.          . famotidine (PEPCID) 20 MG tablet   Oral   Take 20 mg by mouth 2 (two) times daily as needed for heartburn or indigestion.         . fluocinonide cream (LIDEX) 0.05 %   Topical   Apply 1 application topically daily. To infected area.         . fluticasone (FLONASE) 50 MCG/ACT nasal spray   Nasal   Place 2 sprays into the nose daily. Each nostril once a day.         . Multiple Vitamin (MULTIVITAMIN WITH MINERALS) TABS tablet   Oral   Take 1 tablet by mouth daily.         [name] Kitchen warfarin (COUMADIN) 4 MG tablet   Oral   Take 4 mg by mouth every evening.          BP 151/83  Pulse 76  Temp(Src) 98.1 F (36.7 C) (Oral)  Resp 20  SpO2 96% Physical Exam   Nursing note and vitals reviewed.  80 year old male, resting comfortably and in no acute distress. Vital signs are significant for hypertension with blood  pressure 151/83. Oxygen saturation is 96%, which is normal. Head is normocephalic and atraumatic. PERRLA, EOMI. Oropharynx is clear. Neck is nontender and supple without adenopathy or JVD. Back is nontender and there is no CVA tenderness. Lungs are clear without rales, wheezes, or rhonchi. Chest is nontender. Heart has regular rate and rhythm without murmur. Abdomen is soft, and moderately distended. Liver is enlarged to about 2 cm below the costal margin and the liver edge is tender. No other abdominal tenderness is elicited. There is no rebound or guarding. There are no other masses or organ enlargement. Peristalsis is hypoactive. Extremities have no cyanosis or edema, full range of motion is present. Skin is warm and dry without rash. Neurologic: Mental status is normal, cranial nerves are intact, there are no motor or sensory deficits.  ED Course  Procedures (including critical care time) Labs Review Results for orders placed during the hospital encounter of 08/28/13  CBC WITH DIFFERENTIAL      Result Value Ref Range   WBC 14.8 (*) 4.0 - 10.5 K/uL   RBC 4.87  4.22 - 5.81 MIL/uL   Hemoglobin 15.5  13.0 - 17.0 g/dL   HCT 44.6  39.0 - 52.0 %   MCV 91.6  78.0 - 100.0 fL   MCH 31.8  26.0 - 34.0 pg   MCHC 34.8  30.0 - 36.0 g/dL   RDW 15.6 (*) 11.5 - 15.5 %   Platelets 230  150 - 400 K/uL   Neutrophils Relative % 78 (*) 43 - 77 %   Neutro Abs 11.5 (*) 1.7 - 7.7 K/uL   Lymphocytes Relative 13  12 - 46 %   Lymphs Abs 1.9  0.7 - 4.0 K/uL   Monocytes Relative 9  3 - 12 %   Monocytes Absolute 1.4 (*) 0.1 - 1.0 K/uL   Eosinophils Relative 0  0 - 5 %   Eosinophils Absolute 0.0  0.0 - 0.7 K/uL   Basophils Relative 0  0 - 1 %   Basophils Absolute 0.0  0.0 - 0.1 K/uL  COMPREHENSIVE METABOLIC PANEL      Result Value Ref Range   Sodium  142  137 - 147 mEq/L   Potassium 4.3  3.7 - 5.3 mEq/L   Chloride 103  96 - 112 mEq/L   CO2 24  19 - 32 mEq/L   Glucose, Bld 122 (*) 70 - 99 mg/dL   BUN 25 (*) 6 - 23 mg/dL   Creatinine, Ser 1.25  0.50 - 1.35 mg/dL   Calcium 9.0  8.4 - 10.5 mg/dL   Total Protein 7.1  6.0 - 8.3 g/dL   Albumin 3.4 (*) 3.5 - 5.2 g/dL   AST 28  0 - 37 U/L   ALT 24  0 - 53 U/L   Alkaline Phosphatase 81  39 - 117 U/L   Total Bilirubin 0.4  0.3 - 1.2 mg/dL   GFR calc non Af Amer 53 (*) >90 mL/min   GFR calc Af Amer 62 (*) >90 mL/min  LIPASE, BLOOD      Result Value Ref Range   Lipase 17  11 - 59 U/L  PROTIME-INR      Result Value Ref Range   Prothrombin Time 24.1 (*) 11.6 - 15.2 seconds   INR 2.24 (*) 0.00 - 1.49  I-STAT CG4 LACTIC ACID, ED      Result Value Ref Range   Lactic Acid, Venous 2.54 (*) 0.5 - 2.2 mmol/L    Date: 08/28/2013  Rate: 72  Rhythm: normal sinus rhythm  QRS Axis: right  Intervals: normal  ST/T Wave abnormalities: nonspecific T wave changes  Conduction Disutrbances:none  Narrative Interpretation: Right axis deviation, old anteroseptal myocardial infarction, nonspecific T wave changes. When compared with ECG of 08/27/2011, no significant changes are seen.  Old EKG Reviewed: unchanged  MDM   Final diagnoses:  Abdominal pain  Elevated lactic acid level  Anticoagulated on warfarin    Abdominal pain which seems to correlate with the liver margin. I have reviewed his records and on ED visit 2 days ago, CT scan. In the upper abdomen and gallbladder appeared normal and liver appeared grossly normal. He'll be sent for CT of abdomen and pelvis today. Screening labs including liver enzymes will be checked.  CT is still pending. Lactic acid levels come back minimally elevated and he has significant leukocytosis. Case is signed out to Dr. Jeneen Rinks to evaluate results of CT scan.  Delora Fuel, MD [phone]

## 2013-08-29 ENCOUNTER — Inpatient Hospital Stay (HOSPITAL_COMMUNITY): Payer: Medicare Other

## 2013-08-29 ENCOUNTER — Encounter (HOSPITAL_COMMUNITY): Payer: Self-pay | Admitting: General Surgery

## 2013-08-29 DIAGNOSIS — R69 Illness, unspecified: Secondary | ICD-10-CM

## 2013-08-29 LAB — PREPARE FRESH FROZEN PLASMA
UNIT DIVISION: 0
UNIT DIVISION: 0
Unit division: 0
Unit division: 0

## 2013-08-29 LAB — CBC
HCT: 35 % — ABNORMAL LOW (ref 39.0–52.0)
Hemoglobin: 11.8 g/dL — ABNORMAL LOW (ref 13.0–17.0)
MCH: 30.7 pg (ref 26.0–34.0)
MCHC: 33.7 g/dL (ref 30.0–36.0)
MCV: 91.1 fL (ref 78.0–100.0)
PLATELETS: 179 10*3/uL (ref 150–400)
RBC: 3.84 MIL/uL — ABNORMAL LOW (ref 4.22–5.81)
RDW: 15.7 % — AB (ref 11.5–15.5)
WBC: 9.3 10*3/uL (ref 4.0–10.5)

## 2013-08-29 LAB — BASIC METABOLIC PANEL
BUN: 20 mg/dL (ref 6–23)
CALCIUM: 8 mg/dL — AB (ref 8.4–10.5)
CO2: 23 meq/L (ref 19–32)
CREATININE: 1.19 mg/dL (ref 0.50–1.35)
Chloride: 104 mEq/L (ref 96–112)
GFR calc Af Amer: 66 mL/min — ABNORMAL LOW (ref >90)
GFR, EST NON AFRICAN AMERICAN: 57 mL/min — AB (ref >90)
GLUCOSE: 106 mg/dL — AB (ref 70–99)
Potassium: 4.4 mEq/L (ref 3.7–5.3)
Sodium: 143 mEq/L (ref 137–147)

## 2013-08-29 LAB — PROTIME-INR
INR: 2.06 — ABNORMAL HIGH (ref 0.00–1.49)
INR: 1.93 — AB (ref 0.00–1.49)
PROTHROMBIN TIME: 22.6 s — AB (ref 11.6–15.2)
Prothrombin Time: 21.5 seconds — ABNORMAL HIGH (ref 11.6–15.2)

## 2013-08-29 LAB — GLUCOSE, CAPILLARY
GLUCOSE-CAPILLARY: 106 mg/dL — AB (ref 70–99)
GLUCOSE-CAPILLARY: 121 mg/dL — AB (ref 70–99)
GLUCOSE-CAPILLARY: 95 mg/dL (ref 70–99)
Glucose-Capillary: 133 mg/dL — ABNORMAL HIGH (ref 70–99)
Glucose-Capillary: 136 mg/dL — ABNORMAL HIGH (ref 70–99)
Glucose-Capillary: 84 mg/dL (ref 70–99)

## 2013-08-29 MED ORDER — BIOTENE DRY MOUTH MT LIQD
15.0000 mL | Freq: Two times a day (BID) | OROMUCOSAL | Status: DC
  Administered 2013-08-30 – 2013-09-06 (×15): 15 mL via OROMUCOSAL

## 2013-08-29 MED ORDER — SODIUM CHLORIDE 0.9 % IJ SOLN
9.0000 mL | INTRAMUSCULAR | Status: DC | PRN
Start: 2013-08-29 — End: 2013-09-05

## 2013-08-29 MED ORDER — ALBUTEROL SULFATE (2.5 MG/3ML) 0.083% IN NEBU
2.5000 mg | INHALATION_SOLUTION | RESPIRATORY_TRACT | Status: DC | PRN
  Administered 2013-08-31 – 2013-09-03 (×3): 2.5 mg via RESPIRATORY_TRACT
  Filled 2013-08-29 (×4): qty 3

## 2013-08-29 MED ORDER — BUDESONIDE 0.25 MG/2ML IN SUSP
0.2500 mg | Freq: Four times a day (QID) | RESPIRATORY_TRACT | Status: DC
  Administered 2013-08-29: 0.25 mg via RESPIRATORY_TRACT
  Filled 2013-08-29 (×4): qty 2

## 2013-08-29 MED ORDER — ALBUTEROL SULFATE (2.5 MG/3ML) 0.083% IN NEBU
2.5000 mg | INHALATION_SOLUTION | Freq: Four times a day (QID) | RESPIRATORY_TRACT | Status: DC
  Administered 2013-08-29: 2.5 mg via RESPIRATORY_TRACT
  Filled 2013-08-29: qty 3

## 2013-08-29 MED ORDER — IPRATROPIUM BROMIDE 0.02 % IN SOLN
0.5000 mg | Freq: Four times a day (QID) | RESPIRATORY_TRACT | Status: DC
  Administered 2013-08-29: 0.5 mg via RESPIRATORY_TRACT
  Filled 2013-08-29: qty 2.5

## 2013-08-29 MED ORDER — IPRATROPIUM-ALBUTEROL 0.5-2.5 (3) MG/3ML IN SOLN
3.0000 mL | RESPIRATORY_TRACT | Status: DC
Start: 2013-08-29 — End: 2013-08-29

## 2013-08-29 MED ORDER — NALOXONE HCL 0.4 MG/ML IJ SOLN: 0.4000 mg | INTRAMUSCULAR | Status: DC | PRN

## 2013-08-29 MED ORDER — EPHEDRINE SULFATE 50 MG/ML IJ SOLN
INTRAMUSCULAR | Status: AC
  Filled 2013-08-29: qty 1

## 2013-08-29 MED ORDER — PHENYLEPHRINE HCL 10 MG/ML IJ SOLN
INTRAMUSCULAR | Status: AC
  Filled 2013-08-29: qty 1

## 2013-08-29 MED ORDER — DIPHENHYDRAMINE HCL 12.5 MG/5ML PO ELIX
12.5000 mg | ORAL_SOLUTION | Freq: Four times a day (QID) | ORAL | Status: DC | PRN
  Filled 2013-08-29: qty 5

## 2013-08-29 MED ORDER — IPRATROPIUM-ALBUTEROL 0.5-2.5 (3) MG/3ML IN SOLN
3.0000 mL | Freq: Four times a day (QID) | RESPIRATORY_TRACT | Status: DC
  Administered 2013-08-29 – 2013-09-06 (×31): 3 mL via RESPIRATORY_TRACT
  Filled 2013-08-29 (×32): qty 3

## 2013-08-29 MED ORDER — SUCCINYLCHOLINE CHLORIDE 20 MG/ML IJ SOLN
INTRAMUSCULAR | Status: AC
  Filled 2013-08-29: qty 1

## 2013-08-29 MED ORDER — FENTANYL CITRATE 0.05 MG/ML IJ SOLN
INTRAMUSCULAR | Status: AC
  Administered 2013-08-29: 25 ug via INTRAVENOUS
  Filled 2013-08-29: qty 2

## 2013-08-29 MED ORDER — DIPHENHYDRAMINE HCL 50 MG/ML IJ SOLN: 12.5000 mg | Freq: Four times a day (QID) | INTRAMUSCULAR | Status: DC | PRN

## 2013-08-29 MED ORDER — SODIUM CHLORIDE 0.9 % IJ SOLN
INTRAMUSCULAR | Status: AC
  Filled 2013-08-29: qty 10

## 2013-08-29 MED ORDER — ONDANSETRON HCL 4 MG/2ML IJ SOLN: 4.0000 mg | Freq: Four times a day (QID) | INTRAMUSCULAR | Status: DC | PRN

## 2013-08-29 MED ORDER — FENTANYL CITRATE 0.05 MG/ML IJ SOLN
25.0000 ug | Freq: Once | INTRAMUSCULAR | Status: AC
  Administered 2013-08-29: 25 ug via INTRAVENOUS

## 2013-08-29 MED ORDER — FENTANYL 10 MCG/ML IV SOLN
INTRAVENOUS | Status: DC
  Administered 2013-08-29: 70 ug via INTRAVENOUS
  Administered 2013-08-29: 40 ug via INTRAVENOUS
  Administered 2013-08-29: 14:00:00 via INTRAVENOUS
  Administered 2013-08-30 (×2): 60 ug via INTRAVENOUS
  Administered 2013-08-30: 20 ug via INTRAVENOUS
  Administered 2013-08-30 (×2): 50 ug via INTRAVENOUS
  Administered 2013-08-31: 10 ug via INTRAVENOUS
  Administered 2013-08-31: 80 ug via INTRAVENOUS
  Administered 2013-08-31: 10 ug via INTRAVENOUS
  Administered 2013-09-01: 50 ug via INTRAVENOUS
  Administered 2013-09-01: 20 ug via INTRAVENOUS
  Administered 2013-09-01: 10 ug via INTRAVENOUS
  Administered 2013-09-01: 30 ug via INTRAVENOUS
  Administered 2013-09-02 (×3): 10 ug via INTRAVENOUS
  Administered 2013-09-02: 30 ug via INTRAVENOUS
  Administered 2013-09-03: 40 ug via INTRAVENOUS
  Administered 2013-09-03: 100 ug via INTRAVENOUS
  Administered 2013-09-03: 50 ug via INTRAVENOUS
  Administered 2013-09-03: 30 ug via INTRAVENOUS
  Administered 2013-09-03: 20 ug via INTRAVENOUS
  Administered 2013-09-04 – 2013-09-05 (×2): 10 ug via INTRAVENOUS
  Administered 2013-09-05: via INTRAVENOUS
  Filled 2013-08-29 (×3): qty 50

## 2013-08-29 MED ORDER — IPRATROPIUM-ALBUTEROL 0.5-2.5 (3) MG/3ML IN SOLN: 3.0000 mL | Freq: Four times a day (QID) | RESPIRATORY_TRACT | Status: DC

## 2013-08-29 MED ORDER — BUDESONIDE 0.25 MG/2ML IN SUSP
0.2500 mg | Freq: Four times a day (QID) | RESPIRATORY_TRACT | Status: DC
  Administered 2013-08-29 – 2013-09-04 (×23): 0.25 mg via RESPIRATORY_TRACT
  Filled 2013-08-29 (×27): qty 2

## 2013-08-29 MED ORDER — VITAMIN K1 10 MG/ML IJ SOLN
5.0000 mg | Freq: Once | INTRAVENOUS | Status: AC
  Administered 2013-08-29: 5 mg via INTRAVENOUS
  Filled 2013-08-29: qty 0.5

## 2013-08-29 NOTE — Progress Notes (Signed)
1 Day Post-Op  Subjective: Responsive, intubated  Objective: Vital signs in last 24 hours: Temp:  [98.2 F (36.8 C)-100.2 F (37.9 C)] 100.2 F (37.9 C) (04/01 0700) Pulse Rate:  [65-94] 65 (04/01 0600) Resp:  [14-26] 14 (04/01 0600) BP: (103-196)/(56-93) 134/70 mmHg (04/01 0834) SpO2:  [94 %-100 %] 100 % (04/01 0600) FiO2 (%):  [40 %] 40 % (04/01 0834)    Intake/Output from previous day: 03/31 0701 - 04/01 0700 In: 4836.9 [I.V.:3494.9; Blood:482; IV Piggyback:400] Out: 2075 [Urine:1775; Blood:300] Intake/Output this shift:    General appearance: no distress Resp: coarse bilateral breath sounds Cardio: regular rate and rhythm GI: dressing intact, drains serosang, approp tender  Lab Results:   Recent Labs  08/28/13 1620 08/29/13 0200  WBC 10.5 9.3  HGB 13.2 11.8*  HCT 38.7* 35.0*  PLT 208 179   BMET  Recent Labs  08/28/13 0443 08/28/13 1620 08/29/13 0200  NA 142  --  143  K 4.3  --  4.4  CL 103  --  104  CO2 24  --  23  GLUCOSE 122*  --  106*  BUN 25*  --  20  CREATININE 1.25 1.21 1.19  CALCIUM 9.0  --  8.0*   PT/INR  Recent Labs  08/28/13 1620 08/29/13 0200  LABPROT 19.2* 22.6*  INR 1.67* 2.06*   ABG  Recent Labs  08/28/13 1614  PHART 7.347*  HCO3 25.3*    Studies/Results: Ct Abdomen Pelvis W Contrast  08/28/2013   CLINICAL DATA:  Abdominal pain.  EXAM: CT ABDOMEN AND PELVIS WITH CONTRAST  TECHNIQUE: Multidetector CT imaging of the abdomen and pelvis was performed using the standard protocol following bolus administration of intravenous contrast.  CONTRAST:  85 cc Omnipaque 300.  COMPARISON:  Chest CT 08/26/2013.  FINDINGS: New from the limited imaging of the upper abdomen performed on 08/26/2013 is the presence of extra luminal contrast/fluid centered at the level of the proximal duodenum. Additionally, loculated gas and fluid collection extends into the porta hepatis. The proximal duodenum appears thickened with focal gas containing  collection. Findings are suspicious for proximal duodenal ulceration with perforation. Within the gastric antrum/ pylorus, polypoid filling defect is noted. Mass at this level therefore cannot be completely excluded contributing to the perforation.  Circumferential thickening of the distal esophagus. Although this may be related to reflux and esophagitis, mass not excluded.  Small amount of peri hepatic fluid is present.  Scattered colonic diverticula most notable sigmoid colon region. There is a tiny amount of fluid in the pelvis which I suspect is related to the duodenal perforation rather than diverticulitis.  Fatty containing inguinal hernias. Loop of small bowel extends towards the upper aspect the right inguinal hernia however, no bowel extends into the hernia.  Mild fatty infiltration of the liver suspected. No focal worrisome hepatic lesion. Tiny spleen without worrisome mass. No worrisome pancreatic or adrenal lesion.  Renal lesions larger ones which are cysts whereas some of the small ones are too small to adequately characterize. Lobulated contour or of the left kidney with renal parenchymal thinning may reflect result of prior episodes of obstruction. 3 mm nonobstructing upper pole left renal calculi. Mild fullness of the left renal collecting system on delayed imaging without point of obstruction noted.  Atherosclerotic type changes abdominal aorta without aneurysmal dilation. Atherosclerotic type changes abdominal aortic branch vessels including iliac arteries.  No adenopathy.  Mild degenerative changes lumbar spine without bony destructive lesion.  Sludge within gallbladder. Limited for evaluating for  gallbladder inflammation by CT.  No obvious bladder mass although evaluation limited. Calcified aspects of the prostate gland.  IMPRESSION: Findings are suspicious for duodenal ulceration with perforation as detailed above. Within the gastric antrum/ pylorus, polypoid filling defect is noted. Mass at  this level therefore cannot be completely excluded contributing to the perforation.  Circumferential thickening of the distal esophagus. Although this may be related to reflux and esophagitis, mass not excluded.  Scattered colonic diverticula most notable sigmoid colon region. There is a tiny amount of fluid in the pelvis which I suspect is related to the duodenal perforation rather than diverticulitis.  Renal lesions larger ones which are cysts whereas some of the small ones are too small to adequately characterize. Lobulated contour or of the left kidney with renal parenchymal thinning may reflect result of prior episodes of obstruction. 3 mm nonobstructing upper pole left renal calculi. Mild fullness of the left renal collecting system on delayed imaging without point of obstruction noted.  Calcification portions of minimally asymmetric the prostate gland without adjacent adenopathy or sclerotic lesion detected to suggest spread of known prostate cancer. Correlation with PSA recommended.  These results were called by telephone at the time of interpretation on 08/28/2013 at 7:57 AM to Dr. Delora Fuel , who verbally acknowledged these results.   Electronically Signed   By: Chauncey Cruel M.D.   On: 08/28/2013 08:16   Portable Chest Xray In Am  08/29/2013   CLINICAL DATA:  ETT placement.  Shortness of breath.  EXAM: PORTABLE CHEST - 1 VIEW  COMPARISON:  08/28/2013  FINDINGS: Endotracheal tube ends in the mid thoracic trachea. An enteric tube crosses the diaphragm.  Unchanged heart size and mediastinal contours. Chronic interstitial coarsening without edema or consolidation. Previous lung resection on the right. No effusion or pneumothorax.  IMPRESSION: 1. Endotracheal and orogastric tubes remain in good position. 2. Emphysema with stable pulmonary inflation.   Electronically Signed   By: Jorje Guild M.D.   On: 08/29/2013 05:30   Dg Chest Port 1 View  08/28/2013   CLINICAL DATA:  ETT placement  EXAM: PORTABLE  CHEST - 1 VIEW  COMPARISON:  CT ANGIO CHEST W/CM &/OR WO/CM dated 08/26/2013; DG CHEST 2 VIEW dated 08/26/2013  FINDINGS: Endotracheal tube tip 4 cm above the carina. NG tube tip not viewed on this study. Postsurgical changes lateral right lung base. Diffuse prominence of the interstitial markings and mild diffuse pulmonary opacities with greatest areas of confluence in the lung bases. No acute osseous abnormalities. Cardiac silhouette within normal limits.  IMPRESSION: Support tubes adequately positioned.  Interstitial infiltrate component secondary to chronic bronchitic changes. An infectious or inflammatory component also a diagnostic consideration.   Electronically Signed   By: Margaree Mackintosh M.D.   On: 08/28/2013 15:22    Anti-infectives: Anti-infectives   Start     Dose/Rate Route Frequency Ordered Stop   08/28/13 1830  piperacillin-tazobactam (ZOSYN) IVPB 3.375 g     3.375 g 12.5 mL/hr over 240 Minutes Intravenous 3 times per day 08/28/13 1616     08/28/13 1200  fluconazole (DIFLUCAN) IVPB 100 mg     100 mg 50 mL/hr over 60 Minutes Intravenous Every 24 hours 08/28/13 0927     08/28/13 1030  piperacillin-tazobactam (ZOSYN) IVPB 3.375 g     3.375 g 100 mL/hr over 30 Minutes Intravenous  Once 08/28/13 1025 08/28/13 1122   08/28/13 1000  piperacillin-tazobactam (ZOSYN) IVPB 3.375 g  Status:  Discontinued     3.375 g  12.5 mL/hr over 240 Minutes Intravenous 3 times per day 08/28/13 0927 08/28/13 1026      Assessment/Plan: POD 1 graham patch, gastrostomy, egd 1. Neuro- continue fentanyl, propofol until extubation then will start pca 2. cv- stable 3. pulm- hopefully wean and extubate per ccm 4. Gi- drains look mostly serosang no bile, continue npo, will take out ng, leave g tube to gravity, continue drains, ideally protonix but due to shortage on pepcid 5. Heme- will give 2 units ffp due to some oozing and elevated inr 6. Id- d2/7 diflucan/zosyn 7. lovenox for proph will hold today due to  inr 8. scds  [facility] 08/29/2013

## 2013-08-29 NOTE — Clinical Documentation Improvement (Signed)
Possible Clinical Conditions :  Please clarify if Sepsis was present on admission and the etiology of the Sepsis.   Sepsis present on admission  due to peritonitis  Other Condition  Cannot clinically Determine   Clinical Indicators:   Per 08/28/13 H&P: Due to continued severe pain, he came to the [facility] this morning. He was noted to have a WBC of 14.8K and an INR of 2.24.  Per 08/28/13 Progress Note: Severe sepsis in setting of peritonitis.  Per 3/3/115 Consult note: SIRS/sepsis due to perf abd viscous,  abd sepsis.        Thank You, Serena Colonel ,RN Clinical Documentation Specialist:  [place] [employer]

## 2013-08-29 NOTE — Procedures (Signed)
Extubation Procedure Note  Patient Details:   Name: Jon White DOB: Jan 25, 1933 MRN: [id number]   Airway Documentation:  Airway 8 mm (Active)  Secured at (cm) 22 cm 08/29/2013  8:34 AM  Measured From Lips 08/29/2013  8:34 AM  [place] 08/29/2013  8:34 AM  Secured By [employer] 08/29/2013  4:00 AM  Tube Holder Repositioned Yes 08/29/2013  8:34 AM  Cuff Pressure (cm H2O) 22 cm H2O 08/29/2013  8:34 AM  Site Condition Dry 08/29/2013  8:34 AM    Evaluation  O2 sats: stable throughout Complications: No apparent complications Patient did tolerate procedure well. Bilateral Breath Sounds: Clear Suctioning: Airway;Oral Yes  Pt extuabted to 3lpm [place], sx prior to thick red tinged secretion, preoxygenation done, incentive achieved 1000cc;s x10. Pt has a strong productive cough of thick yellow secretions. Pt verbal at this time. No distress noted. Instructed use of [place]. Neb tx started, no distress noted. [name] [facility] 08/29/2013, 11:14 AM

## 2013-08-29 NOTE — Progress Notes (Addendum)
PULMONARY / CRITICAL CARE MEDICINE .nx  Name: Jon White MRN: [id number] DOB: Jan 03, 1933    ADMISSION DATE:  08/28/2013 CONSULTATION DATE:  08/28/2013  REFERRING MD :  Donne Hazel  PRIMARY SERVICE: General Surgery  CHIEF COMPLAINT:  COPD/PE, assist with post-op management   BRIEF PATIENT DESCRIPTION:   80 yo former smoker, f/b MR for COPD Stage II/Class B and remote PE in 2013 (on coumadin), also [place] for OSA (CPAP 12 cm H2O). Admitted on 3/31 w/ perforated duodenal ulcer, repaired on 3/31. PCCM was asked to see pre/post-op given concern about his multiple pulmonary comorbids.  SIGNIFICANT EVENTS / STUDIES:  3/31 CT abd/pelvis findings are suspicious for duodenal ulceration with perforation as detailed above. Within the gastric antrum/ pylorus 3/31 Intubated. Perforated duodenal ulcer repair with min blood loss. G tube placed 4/1 Extubated successfully  LINES / TUBES: ET Tube 3/31>>4/1 G-tube 3/31 >>   CULTURES: None   ANTIBIOTICS: Zosyn 3/31>>  Diflucan 3/31>>   SUBJECTIVE: Pt currently intubated and doing well with minimal abdominal pain.  Denies nausea or emesis.    VITAL SIGNS: Temp:  [98.2 F (36.8 C)-100.2 F (37.9 C)] 100.2 F (37.9 C) (04/01 0700) Pulse Rate:  [65-94] 72 (04/01 1330) Resp:  [11-24] 16 (04/01 1330) BP: (103-196)/(56-93) 121/64 mmHg (04/01 1330) SpO2:  [93 %-100 %] 96 % (04/01 1330) FiO2 (%):  [40 %] 40 % (04/01 0900) HEMODYNAMICS:   VENTILATOR SETTINGS: Vent Mode:  [-] PSV;CPAP FiO2 (%):  [40 %] 40 % Set Rate:  [14 bmp-16 bmp] 14 bmp Vt Set:  [550 mL] 550 mL PEEP:  [5 cmH20] 5 cmH20 Pressure Support:  [5 cmH20] 5 cmH20 Plateau Pressure:  [21 cmH20-31 cmH20] 31 cmH20 INTAKE / OUTPUT: Intake/Output     03/31 0701 - 04/01 0700 04/01 0701 - 04/02 0700   I.V. (mL/kg) 3494.9 (44.8) 233.8 (3)   Blood 482    Other 460 130   IV Piggyback 400    Total Intake(mL/kg) 4836.9 (62) 363.8 (4.7)   Urine (mL/kg/hr) 1775 (0.9) 390 (0.7)   Drains   30 (0.1)   Blood 300 (0.2)    Total Output 2075 420   Net +2761.9 -56.2          PHYSICAL EXAMINATION: General: Intubated, responsive  Neuro: MAEs  HEENT: WNL  Cardiovascular: RRR, no murmur  Lungs: mildly coarse anteriorly  Abdomen: RLQ tenderness, SDI w/ ss drainage   Ext: SCD's  LABS: I have reviewed all the lab results.    ASSESSMENT / PLAN:  PULMONARY A: Acute Respiratory Failure s/p lap for per duodenal ulcer w/ repair and G tube placement GOLD stage II/ class B COPD OSA Remote PE (2013) P:  Ready for extubatation  Scheduled duonebs   Scheduled pulmicort neb   Albuterol Neb PRN CXR in AM Lovenox for ppx, hold due to elevated INR Pulmonary toliet CPAP at night Cont pulse ox  CARDIOVASCULAR A: Normotensive P: Maintenance IVF's       Avoid hypotension   RENAL A:  CKD Stage II at baseline Cr P: Monitor BMET intermittently  Monitor I/Os  Correct electrolytes as indicated Maintenance IVF's Foley in place  GASTROINTESTINAL A: Perforated duodenal ulcer s/p repair and G tube placement GERD  H/o esophageal strictures P: Remove NG tube, leave G-tube      Continue drains        NPO,  D5 1/2 NS KCl      Fentanyl PCA       IV pepcid BID  HEMATOLOGIC A: Supra therapeutic INR on coumadin s/p 4 FFU, mild oozing History of  PE (2013) History of Prostate Ca (>20 yrs) P: IV Vitamin K x 1     Transfuse 2 U FFP          Lovenox for ppx, hold due to elevated INR     Monitor INR daily, goal >1.5    INFECTIOUS A:  Sepsis secondary to perforated duodenal ulcer, resolved  P:   Cont IV zosyn and diflucan Wound care per surgery Monitor CBC intermittently   ENDOCRINE A:  Hyperglycemia, resolved P:  ISS if CBG> 180  NEUROLOGIC A:  Post-op pain, improving  P:  Fentanyl PCA  Code: Full   CCM time: 28 mins  Merton Border, MD ; [facility] service Mobile [phone].  After 5:30 PM or weekends, call [phone]

## 2013-08-29 NOTE — Progress Notes (Signed)
On call MD contact for [facility]. Pts number 2 JP drain output color has changed from what was a red/pinkish color to a green/bile color over the last 8 hours. MD notified of change. No new orders received at this time. Will continue to monitor.

## 2013-08-29 NOTE — Anesthesia Postprocedure Evaluation (Signed)
  Anesthesia Post-op Note  Patient: Jon White  Procedure(s) Performed: Procedure(s): EXPLORATORY LAPAROTOMY (N/A) ESOPHAGOGASTRODUODENOSCOPY (EGD) (N/A) DUODENAL ULCER PATCH (N/A) GASTROSTOMY (N/A)  Patient Location: SICU  Anesthesia Type:General  Level of Consciousness: Patient remains intubated per anesthesia plan  Airway and Oxygen Therapy: Patient remains intubated per anesthesia plan and Patient placed on Ventilator (see vital sign flow sheet for setting)  Post-op Pain: none  Post-op Assessment: Post-op Vital signs reviewed, Patient's Cardiovascular Status Stable, Respiratory Function Stable, Patent Airway, No signs of Nausea or vomiting and Pain level controlled  Post-op Vital Signs: stable  Complications: No apparent anesthesia complications

## 2013-08-30 DIAGNOSIS — J9600 Acute respiratory failure, unspecified whether with hypoxia or hypercapnia: Secondary | ICD-10-CM

## 2013-08-30 LAB — BASIC METABOLIC PANEL
BUN: 16 mg/dL (ref 6–23)
CO2: 24 mEq/L (ref 19–32)
Calcium: 8.5 mg/dL (ref 8.4–10.5)
Chloride: 104 mEq/L (ref 96–112)
Creatinine, Ser: 1.13 mg/dL (ref 0.50–1.35)
GFR, EST AFRICAN AMERICAN: 70 mL/min — AB (ref >90)
GFR, EST NON AFRICAN AMERICAN: 60 mL/min — AB (ref >90)
GLUCOSE: 150 mg/dL — AB (ref 70–99)
POTASSIUM: 3.9 meq/L (ref 3.7–5.3)
Sodium: 140 mEq/L (ref 137–147)

## 2013-08-30 LAB — CBC
HCT: 34.8 % — ABNORMAL LOW (ref 39.0–52.0)
HEMOGLOBIN: 11.5 g/dL — AB (ref 13.0–17.0)
MCH: 30.4 pg (ref 26.0–34.0)
MCHC: 33 g/dL (ref 30.0–36.0)
MCV: 92.1 fL (ref 78.0–100.0)
PLATELETS: 153 10*3/uL (ref 150–400)
RBC: 3.78 MIL/uL — ABNORMAL LOW (ref 4.22–5.81)
RDW: 15.7 % — AB (ref 11.5–15.5)
WBC: 10.4 10*3/uL (ref 4.0–10.5)

## 2013-08-30 LAB — PREPARE FRESH FROZEN PLASMA
Unit division: 0
Unit division: 0

## 2013-08-30 LAB — PROTIME-INR
INR: 1.25 (ref 0.00–1.49)
PROTHROMBIN TIME: 15.4 s — AB (ref 11.6–15.2)

## 2013-08-30 LAB — GLUCOSE, CAPILLARY
GLUCOSE-CAPILLARY: 131 mg/dL — AB (ref 70–99)
GLUCOSE-CAPILLARY: 111 mg/dL — AB (ref 70–99)
GLUCOSE-CAPILLARY: 137 mg/dL — AB (ref 70–99)
Glucose-Capillary: 138 mg/dL — ABNORMAL HIGH (ref 70–99)

## 2013-08-30 MED ORDER — CHLORHEXIDINE GLUCONATE CLOTH 2 % EX PADS: 6.0000 | MEDICATED_PAD | Freq: Every day | CUTANEOUS | Status: DC

## 2013-08-30 MED ORDER — MUPIROCIN 2 % EX OINT
1.0000 "application " | TOPICAL_OINTMENT | Freq: Two times a day (BID) | CUTANEOUS | Status: DC
  Filled 2013-08-30: qty 22

## 2013-08-30 NOTE — Progress Notes (Addendum)
[facility] Progress Note 2 Days Post-Op  Subjective: Patient is extubated and looks good.  Having less pain  Objective: Vital signs in last 24 hours: Temp:  [98.8 F (37.1 C)-99.6 F (37.6 C)] 98.8 F (37.1 C) (04/02 0413) Pulse Rate:  [66-88] 81 (04/02 0600) Resp:  [11-25] 18 (04/02 0600) BP: (104-149)/(55-93) 144/78 mmHg (04/02 0600) SpO2:  [93 %-100 %] 99 % (04/02 0600) FiO2 (%):  [40 %] 40 % (04/01 0900) Weight:  [82.9 kg (182 lb 12.2 oz)] 82.9 kg (182 lb 12.2 oz) (04/02 0500)    Intake/Output from previous day: 04/01 0701 - 04/02 0700 In: 2305.8 [I.V.:1855.8; IV Piggyback:350] Out: 9629 [Urine:1170; Drains:258] Intake/Output this shift:    General: No acute distress  Lungs: Clear to auscultation.  No wheezes or rale or rhonchi  Abd: Distended, hypoactive bowel sounds are present.  Midline wound is open into the subQ.  Clean, no drainage.  Keenan Bachelor drain is putting out large amount of bile.  75cc so far this AM.  Extremities: Pain in calf on the right side.  Has history of DVT.  Not on coumadin.   Neuro: Intact  Lab Results:    BMET  Recent Labs  08/29/13 0200 08/30/13 0243  NA 143 140  K 4.4 3.9  CL 104 104  CO2 23 24  GLUCOSE 106* 150*  BUN 20 16  CREATININE 1.19 1.13  CALCIUM 8.0* 8.5   PT/INR  Recent Labs  08/29/13 0200 08/29/13 1550  LABPROT 22.6* 21.5*  INR 2.06* 1.93*   ABG  Recent Labs  08/28/13 1614  PHART 7.347*  HCO3 25.3*    Studies/Results: Portable Chest Xray In Am  08/29/2013   CLINICAL DATA:  ETT placement.  Shortness of breath.  EXAM: PORTABLE CHEST - 1 VIEW  COMPARISON:  08/28/2013  FINDINGS: Endotracheal tube ends in the mid thoracic trachea. An enteric tube crosses the diaphragm.  Unchanged heart size and mediastinal contours. Chronic interstitial coarsening without edema or consolidation. Previous lung resection on the right. No effusion or pneumothorax.  IMPRESSION: 1. Endotracheal and orogastric tubes remain in good  position. 2. Emphysema with stable pulmonary inflation.   Electronically Signed   By: Jorje Guild M.D.   On: 08/29/2013 05:30   Dg Chest Port 1 View  08/28/2013   CLINICAL DATA:  ETT placement  EXAM: PORTABLE CHEST - 1 VIEW  COMPARISON:  CT ANGIO CHEST W/CM &/OR WO/CM dated 08/26/2013; DG CHEST 2 VIEW dated 08/26/2013  FINDINGS: Endotracheal tube tip 4 cm above the carina. NG tube tip not viewed on this study. Postsurgical changes lateral right lung base. Diffuse prominence of the interstitial markings and mild diffuse pulmonary opacities with greatest areas of confluence in the lung bases. No acute osseous abnormalities. Cardiac silhouette within normal limits.  IMPRESSION: Support tubes adequately positioned.  Interstitial infiltrate component secondary to chronic bronchitic changes. An infectious or inflammatory component also a diagnostic consideration.   Electronically Signed   By: Margaree Mackintosh M.D.   On: 08/28/2013 15:22    Anti-infectives: Anti-infectives   Start     Dose/Rate Route Frequency Ordered Stop   08/28/13 1830  piperacillin-tazobactam (ZOSYN) IVPB 3.375 g     3.375 g 12.5 mL/hr over 240 Minutes Intravenous 3 times per day 08/28/13 1616     08/28/13 1200  fluconazole (DIFLUCAN) IVPB 100 mg     100 mg 50 mL/hr over 60 Minutes Intravenous Every 24 hours 08/28/13 0927     08/28/13 1030  piperacillin-tazobactam (ZOSYN) IVPB  3.375 g     3.375 g 100 mL/hr over 30 Minutes Intravenous  Once 08/28/13 1025 08/28/13 1122   08/28/13 1000  piperacillin-tazobactam (ZOSYN) IVPB 3.375 g  Status:  Discontinued     3.375 g 12.5 mL/hr over 240 Minutes Intravenous 3 times per day 08/28/13 1583 08/28/13 1026      Assessment/Plan: s/p Procedure(s): EXPLORATORY LAPAROTOMY ESOPHAGOGASTRODUODENOSCOPY (EGD) DUODENAL ULCER PATCH GASTROSTOMY May have some ice chips only. Keep G-tube to gravity. Discontinue Foley Dressing changes.  LOS: 2 days   Kathryne Eriksson. [name], MD,  FACS [phone] [facility] Surgery 08/30/2013

## 2013-08-30 NOTE — Progress Notes (Signed)
PULMONARY / CRITICAL CARE MEDICINE .nx  Name: Jon White MRN: [id number] DOB: March 20, 1933    ADMISSION DATE:  08/28/2013 CONSULTATION DATE:  08/28/2013  REFERRING MD :  Donne Hazel  PRIMARY SERVICE: General Surgery  CHIEF COMPLAINT:  COPD/PE, assist with post-op management   BRIEF PATIENT DESCRIPTION:   80 yo former smoker, f/b MR for COPD Stage II/Class B and remote PE in 2013 (on coumadin), also [facility] for OSA (CPAP 12 cm H2O). Admitted on 3/31 w/ perforated duodenal ulcer, repaired on 3/31. PCCM was asked to see pre/post-op given concern about his multiple pulmonary comorbids.  SIGNIFICANT EVENTS / STUDIES:  3/31 CT abd/pelvis findings suspicious for duodenal ulceration with perforation. Exploratory laparotomy surgery revealed perforated duodenal ulcer that was repaired with Phillip Heal patch with minimal  blood loss. Pt required intubation. G tube and drains placed. 4/1 Extubated successfully 4/2  Stable respiratory status, to transfer to SDU. Abdominal pain controlled on PCA fentanyl. Drain with high output bile.   LINES / TUBES: ET Tube 3/31>>4/1 G-tube 3/31 >>   CULTURES: MRSA PCR Neg 3/31  ANTIBIOTICS: Zosyn 3/31>>  Diflucan 3/31>>   SUBJECTIVE: Pt doing well. Breathing comfortably on 2L [place] with minimal abdominal pain. Denies nausea or emesis. No passage of gas or stool yet.      VITAL SIGNS: Temp:  [98.8 F (37.1 C)-99.6 F (37.6 C)] 98.8 F (37.1 C) (04/02 0413) Pulse Rate:  [66-88] 81 (04/02 0600) Resp:  [11-25] 18 (04/02 0600) BP: (104-149)/(55-93) 144/78 mmHg (04/02 0600) SpO2:  [93 %-100 %] 99 % (04/02 0600) FiO2 (%):  [40 %] 40 % (04/01 0900) Weight:  [82.9 kg (182 lb 12.2 oz)] 82.9 kg (182 lb 12.2 oz) (04/02 0500) HEMODYNAMICS:   VENTILATOR SETTINGS: Vent Mode:  [-] PSV;CPAP FiO2 (%):  [40 %] 40 % PEEP:  [5 cmH20] 5 cmH20 Pressure Support:  [5 cmH20] 5 cmH20 INTAKE / OUTPUT: Intake/Output     04/01 0701 - 04/02 0700 04/02 0701 - 04/03 0700   I.V.  (mL/kg) 1855.8 (22.4)    Blood     Other 100    IV Piggyback 350    Total Intake(mL/kg) 2305.8 (27.8)    Urine (mL/kg/hr) 1170 (0.6)    Drains 258 (0.1)    Blood     Total Output 1428     Net +877.8            PHYSICAL EXAMINATION: General: NAD Neuro: A & O x3 HEENT: EOMI  Cardiovascular: RRR, no murmur  Lungs: clear to auscultation anteriorly  Abdomen: Lower abdominal tenderness, hypoactive BS, mild distension,  SDI, Drain with high output bile    Ext: SCD's  LABS: I have reviewed all the lab results.   CXR: Emphysema with stable pulmonary inflation   ASSESSMENT / PLAN:  PULMONARY A: Acute Respiratory Failure s/p lap for perforated duodenal ulcer w/ repair and G tube placement  GOLD stage II/ class B COPD OSA Remote PE (2013) P:  Extubated successfully on 4/1  Oxygen PRN - currently on [place] 2 L Scheduled duonebs   Scheduled pulmicort neb   Albuterol neb PRN Lovenox - long-term therapy per Dr Chase Caller SCD's Pulmonary toliet CPAP at night Cont pulse ox  CARDIOVASCULAR A: Hypertensive P:  Maintenance IVF's  Avoid hypotension   RENAL A:  CKD Stage II at baseline Cr P: Monitor BMET intermittently  Monitor I/Os  Correct electrolytes as indicated Maintenance IVF's D/C foley catheter  GASTROINTESTINAL A: Perforated duodenal ulcer s/p repair and G tube placement  GERD  H/o esophageal strictures P:   G-tube in place  Continue drains per GS - high output bile content    NPO with ice chips  Continue D5 1/2 NS KCl  Fentanyl PCA   IV pepcid BID         HEMATOLOGIC A: Supra therapeutic INR on coumadin s/p 6 FFU and vitamin K - Resolved Normocytic Anemia, likely dilutional   History of  PE (2013) History of Prostate Ca (>20 yrs) P:  Lovenox - long-term therapy per Dr Chase Caller SCD's Monitor INR daily   Monitor CBC intermittently   INFECTIOUS A:  Sepsis secondary to perforated duodenal ulcer - Resolved  P:   Cont IV zosyn and diflucan Wound care  per surgery Monitor CBC intermittently   ENDOCRINE A:  Hyperglycemia P: ISS if CBG> 180 Obtain HbA1c   NEUROLOGIC A:  Post-op pain -Improving  P:   Fentanyl PCA initiated 4/02  Code: Full   Merton Border, MD ; [facility] service Mobile [phone].  After 5:30 PM or weekends, call [phone]

## 2013-08-30 NOTE — Progress Notes (Addendum)
ANTIBIOTIC CONSULT NOTE - INITIAL  Pharmacy Consult for Zosyn Indication: Sepsis  Allergies  Allergen Reactions  . Mometasone Furoate Other (See Comments)    CAUSED RED,ITCHY EYES; "lost my eyelashes" and Omnaris--nose bleed.  . Naproxen Sodium Nausea And Vomiting    "thought I was going to die"  . Anoro Ellipta [Umeclidinium-Vilanterol]     Double vision  . [name] [Aclidinium Bromide]     Urination issues   Patient Measurements: Height: 5\' 8"  (172.7 cm) Weight: 182 lb 12.2 oz (82.9 kg) IBW/kg (Calculated) : 68.4  Vital Signs: Temp: 98.7 F (37.1 C) (04/02 0800) Temp src: Oral (04/02 0800) BP: 145/103 mmHg (04/02 0900) Pulse Rate: 86 (04/02 0900)  Total I/O In: 300 [I.V.:300] Out: 195 [Urine:85; Drains:110]  Labs:  Recent Labs  08/28/13 1620 08/29/13 0200 08/30/13 0243  WBC 10.5 9.3 10.4  HGB 13.2 11.8* 11.5*  PLT 208 179 153  CREATININE 1.21 1.19 1.13   Estimated Creatinine Clearance: 56.5 ml/min (by C-G formula based on Cr of 1.13).  Microbiology: Recent Results (from the past 720 hour(s))  MRSA PCR SCREENING     Status: None   Collection Time    08/28/13  3:11 PM      Result Value Ref Range Status   MRSA by PCR NEGATIVE  NEGATIVE Final   Comment:            The GeneXpert MRSA Assay (FDA     approved for NASAL specimens     only), is one component of a     comprehensive MRSA colonization     surveillance program. It is not     intended to diagnose MRSA     infection nor to guide or     monitor treatment for     MRSA infections.    Medical History: Past Medical History  Diagnosis Date  . Prostate cancer 1992    in remission.  s/p XRT  . Emphysema   . Allergic rhinitis   . Asthma   . COPD (chronic obstructive pulmonary disease)   . Recurrent upper respiratory infection (URI)     coughing from chronic sinusitis   . Normal cardiac stress test     reports stress test was several yrs. ago /w [employer]. Dr. Rogelia Mire, WNL   . OSA (obstructive  sleep apnea) 2007    CPAP- report of settings on paper chart, seen by [facility] - seen 07/2011   . Renal stone     some passed spontaneous, & also had cystoscopy  . Arthritis     hands  . Neuromuscular disorder     reports shaking, somedays "can't hold spoon" - generalized  shakiness, is going to talk to MD about it at next appt.   . High cholesterol   . Shortness of breath on exertion   . H/O hiatal hernia   . Osteoarthritis, generalized   . Chronic renal insufficiency, stage III (moderate)   . Mixed hyperlipidemia   . PE (pulmonary embolism) 10/2011   Medications:  Anti-infectives   Start     Dose/Rate Route Frequency Ordered Stop   08/28/13 1830  piperacillin-tazobactam (ZOSYN) IVPB 3.375 g     3.375 g 12.5 mL/hr over 240 Minutes Intravenous 3 times per day 08/28/13 1616     08/28/13 1200  fluconazole (DIFLUCAN) IVPB 100 mg     100 mg 50 mL/hr over 60 Minutes Intravenous Every 24 hours 08/28/13 0927     08/28/13 1030  piperacillin-tazobactam (ZOSYN) IVPB 3.375  g     3.375 g 100 mL/hr over 30 Minutes Intravenous  Once 08/28/13 1025 08/28/13 1122   08/28/13 1000  piperacillin-tazobactam (ZOSYN) IVPB 3.375 g  Status:  Discontinued     3.375 g 12.5 mL/hr over 240 Minutes Intravenous 3 times per day 08/28/13 4665 08/28/13 1026     Assessment: 30 yom who is s/p exploratory lap for perforated duodenal ulcer.  Patient was started on fluconazole for empiric post abdominal surgery and started on Zosyn due to necrosis and severe sepsis in the setting of his peritonitis.  Pt's Scr has improved to 1.13 with a Crcl of ~56 ml/min, and a UOP of 0.6 ml/kg/hr.  Currently patient is afebrile with Tmax 99.6 and WBC wnl (10.4).  Patient has no cultures.   Goal of Therapy:  Therapeutic response to IV antibiotics  Plan:  1.  Continue Zosyn 3.375 gm IV every 8 hours. 2.  Monitor renal function and UOP  Jeronimo Norma, PharmD Clinical Pharmacist Resident Pager: [phone]  08/30/2013,10:50 AM

## 2013-08-31 DIAGNOSIS — G4733 Obstructive sleep apnea (adult) (pediatric): Secondary | ICD-10-CM

## 2013-08-31 DIAGNOSIS — R109 Unspecified abdominal pain: Secondary | ICD-10-CM

## 2013-08-31 LAB — CBC
HCT: 34.2 % — ABNORMAL LOW (ref 39.0–52.0)
HCT: 33.6 % — ABNORMAL LOW (ref 39.0–52.0)
HEMOGLOBIN: 11.7 g/dL — AB (ref 13.0–17.0)
Hemoglobin: 11.5 g/dL — ABNORMAL LOW (ref 13.0–17.0)
MCH: 31.2 pg (ref 26.0–34.0)
MCH: 31.1 pg (ref 26.0–34.0)
MCHC: 34.2 g/dL (ref 30.0–36.0)
MCHC: 34.2 g/dL (ref 30.0–36.0)
MCV: 91.2 fL (ref 78.0–100.0)
MCV: 90.8 fL (ref 78.0–100.0)
PLATELETS: 209 10*3/uL (ref 150–400)
Platelets: 199 10*3/uL (ref 150–400)
RBC: 3.75 MIL/uL — ABNORMAL LOW (ref 4.22–5.81)
RBC: 3.7 MIL/uL — ABNORMAL LOW (ref 4.22–5.81)
RDW: 15.3 % (ref 11.5–15.5)
RDW: 15.2 % (ref 11.5–15.5)
WBC: 10.9 10*3/uL — ABNORMAL HIGH (ref 4.0–10.5)
WBC: 11.1 10*3/uL — ABNORMAL HIGH (ref 4.0–10.5)

## 2013-08-31 LAB — HEMOGLOBIN A1C
Hgb A1c MFr Bld: 6.1 % — ABNORMAL HIGH (ref <5.7)
Mean Plasma Glucose: 128 mg/dL — ABNORMAL HIGH (ref <117)

## 2013-08-31 LAB — BASIC METABOLIC PANEL
BUN: 12 mg/dL (ref 6–23)
CHLORIDE: 101 meq/L (ref 96–112)
CO2: 22 meq/L (ref 19–32)
CREATININE: 1.15 mg/dL (ref 0.50–1.35)
Calcium: 8.2 mg/dL — ABNORMAL LOW (ref 8.4–10.5)
GFR calc non Af Amer: 59 mL/min — ABNORMAL LOW (ref >90)
GFR, EST AFRICAN AMERICAN: 68 mL/min — AB (ref >90)
GLUCOSE: 125 mg/dL — AB (ref 70–99)
POTASSIUM: 3.7 meq/L (ref 3.7–5.3)
Sodium: 137 mEq/L (ref 137–147)

## 2013-08-31 LAB — PROTIME-INR
INR: 1.24 (ref 0.00–1.49)
PROTHROMBIN TIME: 15.3 s — AB (ref 11.6–15.2)

## 2013-08-31 MED ORDER — MUPIROCIN 2 % EX OINT
1.0000 "application " | TOPICAL_OINTMENT | Freq: Two times a day (BID) | CUTANEOUS | Status: DC
  Filled 2013-08-31: qty 22

## 2013-08-31 MED ORDER — CHLORHEXIDINE GLUCONATE CLOTH 2 % EX PADS: 6.0000 | MEDICATED_PAD | Freq: Every day | CUTANEOUS | Status: DC

## 2013-08-31 NOTE — Progress Notes (Signed)
3 Days Post-Op  Subjective: Got up to chair yesterday, no flatus, PCA working well  Objective: Vital signs in last 24 hours: Temp:  [98.1 F (36.7 C)-99.8 F (37.7 C)] 98.1 F (36.7 C) (04/03 0743) Pulse Rate:  [66-89] 76 (04/03 0743) Resp:  [12-24] 16 (04/03 0743) BP: (109-150)/(55-103) 145/86 mmHg (04/03 0743) SpO2:  [95 %-100 %] 98 % (04/03 0743) Weight:  [176 lb 6.4 oz (80.015 kg)] 176 lb 6.4 oz (80.015 kg) (04/02 1800) Last BM Date: 08/27/13  Intake/Output from previous day: 04/02 0701 - 04/03 0700 In: 2613 [I.V.:2313; IV Piggyback:300] Out: 785 [Urine:460; Drains:325] Intake/Output this shift:    General appearance: cooperative Resp: clear to auscultation bilaterally Cardio: regular rate and rhythm GI: soft, incision CDI, quiet, G-tube draining well, JPs 1/2 bilious  Lab Results:   Recent Labs  08/30/13 0243 08/31/13 0257  WBC 10.4 10.9*  HGB 11.5* 11.7*  HCT 34.8* 34.2*  PLT 153 199   BMET  Recent Labs  08/30/13 0243 08/31/13 0257  NA 140 137  K 3.9 3.7  CL 104 101  CO2 24 22  GLUCOSE 150* 125*  BUN 16 12  CREATININE 1.13 1.15  CALCIUM 8.5 8.2*   PT/INR  Recent Labs  08/30/13 0904 08/31/13 0257  LABPROT 15.4* 15.3*  INR 1.25 1.24   ABG  Recent Labs  08/28/13 1614  PHART 7.347*  HCO3 25.3*    Studies/Results: No results found.  Anti-infectives: Anti-infectives   Start     Dose/Rate Route Frequency Ordered Stop   08/28/13 1830  piperacillin-tazobactam (ZOSYN) IVPB 3.375 g     3.375 g 12.5 mL/hr over 240 Minutes Intravenous 3 times per day 08/28/13 1616     08/28/13 1200  fluconazole (DIFLUCAN) IVPB 100 mg     100 mg 50 mL/hr over 60 Minutes Intravenous Every 24 hours 08/28/13 0927     08/28/13 1030  piperacillin-tazobactam (ZOSYN) IVPB 3.375 g     3.375 g 100 mL/hr over 30 Minutes Intravenous  Once 08/28/13 1025 08/28/13 1122   08/28/13 1000  piperacillin-tazobactam (ZOSYN) IVPB 3.375 g  Status:  Discontinued     3.375  g 12.5 mL/hr over 240 Minutes Intravenous 3 times per day 08/28/13 0927 08/28/13 1026      Assessment/Plan: s/p Procedure(s): EXPLORATORY LAPAROTOMY (N/A) ESOPHAGOGASTRODUODENOSCOPY (EGD) (N/A) DUODENAL ULCER PATCH (N/A) GASTROSTOMY (N/A) POD 3 graham patch, gastrostomy, egd 1. COPD - CCM following 2. Gi- 1 drain bilious so continue G-tube to gravity, ice only 3. Id- d4/7 diflucan/zosyn 4. VTE - start Lovenox as INR and HCT OK  LOS: 3 days    Latrell Potempa E 08/31/2013

## 2013-08-31 NOTE — Evaluation (Signed)
Physical Therapy Evaluation Patient Details Name: Jon White MRN: [id number] DOB: [birth year] Today's Date: 08/31/2013   History of Present Illness  80 yo former smoker, f/b MR for COPD Stage II/Class B and remote PE in 2013 (on coumadin), also [facility] for OSA (CPAP 12 cm H2O). Admitted on 3/31 w/ perforated duodenal ulcer, repaired on 3/31  Clinical Impression  Pt very pleasant gentleman who is moving well but with noted balance and endurance deficits from baseline. Pt able to maintain 90% on RA with activity and reports no pain end of session. Pt with below deficits who will benefit from acute therapy to maximize mobility, balance and function to decrease burden of care. Pt encouraged to mobilize daily with staff. Recommend HHPT unless pt able to return to mod I for all mobility prior to discharge.     Follow Up Recommendations Home health PT    Equipment Recommendations  Rolling walker with 5" wheels    Recommendations for Other Services       Precautions / Restrictions Precautions Precautions: Fall Precaution Comments: drain, 2 jp drain      Mobility  Bed Mobility Overal bed mobility: Needs Assistance Bed Mobility: Rolling;Sidelying to Sit Rolling: Supervision Sidelying to sit: Supervision       General bed mobility comments: cues for sequence to ease transition and pain  Transfers Overall transfer level: Needs assistance   Transfers: Sit to/from Stand Sit to Stand: Min assist         General transfer comment: cues for hand placement and safety with assist for anterior translation  Ambulation/Gait Ambulation/Gait assistance: Min guard Ambulation Distance (Feet): 120 Feet Assistive device: Rolling walker (2 wheeled) Gait Pattern/deviations: Step-through pattern;Decreased stride length   Gait velocity interpretation: Below normal speed for age/gender General Gait Details: initiated gait without RW with pt with very short cautious strides and guarding for fall  with arms with min assist for balance with use of RW greatly improved stride, balance and distance  Stairs            Wheelchair Mobility    Modified Rankin (Stroke Patients Only)       Balance Overall balance assessment: Needs assistance   Sitting balance-Leahy Scale: Good       Standing balance-Leahy Scale: Fair                               Pertinent Vitals/Pain No pain end of session sats 90-93% on RA    Home Living Family/patient expects to be discharged to:: Private residence Living Arrangements: Spouse/significant other Available Help at Discharge: Family;Available 24 hours/day Type of Home: House Home Access: Stairs to enter   [employer] of Steps: 3 Home Layout: Two level;Laundry or work area in basement;Able to live on main level with [employer]: None      Prior Function Level of Independence: Independent         Comments: pt was helping son remodel his home immediately prior to admission although he also reports chronic back issues     Hand Dominance        Extremity/Trunk Assessment   Upper Extremity Assessment: Overall WFL for tasks assessed           Lower Extremity Assessment: Overall WFL for tasks assessed      Cervical / Trunk Assessment: Normal  Communication   Communication: No difficulties  Cognition Arousal/Alertness: Awake/alert Behavior During Therapy: WFL for tasks assessed/performed Overall Cognitive  Status: Within Functional Limits for tasks assessed                      General Comments      Exercises        Assessment/Plan    PT Assessment Patient needs continued PT services  PT Diagnosis Abnormality of gait   PT Problem List Decreased activity tolerance;Decreased balance;Decreased mobility;Decreased knowledge of use of DME  PT Treatment Interventions Gait training;Stair training;Functional mobility training;Therapeutic activities;Therapeutic  exercise;Patient/family education;DME instruction;Balance training;Neuromuscular re-education   PT Goals (Current goals can be found in the Care Plan section) Acute Rehab PT Goals Patient Stated Goal: be able to return to working on the yard and son's house PT Goal Formulation: With patient Time For Goal Achievement: 09/14/13 Potential to Achieve Goals: Good    Frequency Min 3X/week   Barriers to discharge        Co-evaluation               End of Session   Activity Tolerance: Patient tolerated treatment well Patient left: in chair;with call bell/phone within reach;with nursing/sitter in room;with family/visitor present Nurse Communication: Mobility status         Time: 1430-1451 PT Time Calculation (min): 21 min   Charges:   PT Evaluation $Initial PT Evaluation Tier I: 1 Procedure PT Treatments $Gait Training: 8-22 mins   PT G CodesMelford Aase 08/31/2013, 3:08 PM Elwyn Reach, [place]

## 2013-08-31 NOTE — Progress Notes (Addendum)
PULMONARY / CRITICAL CARE MEDICINE .nx  Name: Jon White MRN: [id number] DOB: 01-11-1933    ADMISSION DATE:  08/28/2013 CONSULTATION DATE:  08/28/2013  REFERRING MD :  Donne Hazel  PRIMARY SERVICE: General Surgery  CHIEF COMPLAINT:  COPD/PE, assist with post-op management   BRIEF PATIENT DESCRIPTION:   80 yo former smoker, f/b MR for COPD Stage II/Class B and remote PE in 2013 (on coumadin), also [facility] for OSA (CPAP 12 cm H2O). Admitted on 3/31 w/ perforated duodenal ulcer, repaired on 3/31. PCCM was asked to see pre/post-op given concern about his multiple pulmonary comorbids.  SIGNIFICANT EVENTS / STUDIES:  3/31 CT abd/pelvis findings suspicious for duodenal ulceration with perforation. Exploratory laparotomy surgery revealed perforated duodenal ulcer that was repaired with Phillip Heal patch with minimal  blood loss. Pt required intubation. G tube and drains placed. 4/1 Extubated successfully 4/2  Stable respiratory status, to transfer to SDU. Abdominal pain controlled on PCA fentanyl. Drain with high output bile.   LINES / TUBES: ET Tube 3/31>>4/1 G-tube 3/31 >>   CULTURES: MRSA PCR Neg 3/31  ANTIBIOTICS: Zosyn 3/31>>  Diflucan 3/31>>   SUBJECTIVE: pt denies any breathing issues.  Using IS and getting oob.  No increased wob, excellent sats.  VITAL SIGNS: Temp:  [98.1 F (36.7 C)-99.8 F (37.7 C)] 98.1 F (36.7 C) (04/03 0743) Pulse Rate:  [66-89] 76 (04/03 0743) Resp:  [12-24] 16 (04/03 0743) BP: (109-150)/(55-86) 145/86 mmHg (04/03 0743) SpO2:  [95 %-100 %] 98 % (04/03 0743) Weight:  [80.015 kg (176 lb 6.4 oz)] 80.015 kg (176 lb 6.4 oz) (04/02 1800) HEMODYNAMICS:   VENTILATOR SETTINGS:   INTAKE / OUTPUT: Intake/Output     04/02 0701 - 04/03 0700 04/03 0701 - 04/04 0700   I.V. (mL/kg) 2313 (28.9)    Other     IV Piggyback 300    Total Intake(mL/kg) 2613 (32.7)    Urine (mL/kg/hr) 460 (0.2)    Drains 325 (0.2)    Total Output 785     Net +1828             PHYSICAL EXAMINATION: General: overweight male, nad Neuro: alert, moves all 4.  HEENT: nose without purulence or d/c, neck without LN or TMG Cardiovascular: RRR, no murmur  Lungs: smallish lung volumes, a few basilar crackles, no wheezing. Abdomen: prominent, mild tenderness Ext: SCD's   ASSESSMENT / PLAN:  PULMONARY A: Acute Respiratory Failure s/p lap for perforated duodenal ulcer w/ repair and G tube placement  GOLD stage II/ class B COPD OSA Remote PE (2013)  Pt is stable currently from a pulmonary standpoint.  Would focus on IS, and mobilization as allowed by surgery.  No change in breathing meds thru the w/e.  P:  Oxygen PRN - currently on [employer] 2 L Lovenox - long-term therapy per Dr Chase Caller SCD's Pulmonary toilet with OOB, IS CPAP at night  HEMATOLOGIC A: Supra therapeutic INR on coumadin s/p 6 FFU and vitamin K - Resolved Normocytic Anemia, likely dilutional   History of  PE (2013) History of Prostate Ca (>20 yrs) P:  Lovenox - long-term therapy per Dr Chase Caller SCD's  Will see the pt again on Monday.  Please call for pulmonary issues during the w/e.

## 2013-09-01 LAB — BASIC METABOLIC PANEL
BUN: 10 mg/dL (ref 6–23)
CO2: 22 meq/L (ref 19–32)
CREATININE: 1.11 mg/dL (ref 0.50–1.35)
Calcium: 8 mg/dL — ABNORMAL LOW (ref 8.4–10.5)
Chloride: 103 mEq/L (ref 96–112)
GFR calc Af Amer: 71 mL/min — ABNORMAL LOW (ref >90)
GFR, EST NON AFRICAN AMERICAN: 62 mL/min — AB (ref >90)
Glucose, Bld: 119 mg/dL — ABNORMAL HIGH (ref 70–99)
Potassium: 3.6 mEq/L — ABNORMAL LOW (ref 3.7–5.3)
SODIUM: 141 meq/L (ref 137–147)

## 2013-09-01 MED ORDER — FUROSEMIDE 10 MG/ML IJ SOLN
20.0000 mg | Freq: Once | INTRAMUSCULAR | Status: AC
  Administered 2013-09-01: 20 mg via INTRAVENOUS
  Filled 2013-09-01: qty 2

## 2013-09-01 MED ORDER — POTASSIUM CHLORIDE 10 MEQ/100ML IV SOLN
10.0000 meq | INTRAVENOUS | Status: AC
  Administered 2013-09-01 (×3): 10 meq via INTRAVENOUS
  Filled 2013-09-01 (×3): qty 100

## 2013-09-01 NOTE — Progress Notes (Signed)
4 Days Post-Op  Subjective: Feels ok, coughing a lot  Objective: Vital signs in last 24 hours: Temp:  [98 F (36.7 C)-98.2 F (36.8 C)] 98 F (36.7 C) (04/04 0400) Pulse Rate:  [79-84] 79 (04/03 2003) Resp:  [13-25] 19 (04/04 0759) BP: (118-167)/(59-77) 133/59 mmHg (04/04 0400) SpO2:  [96 %-100 %] 99 % (04/04 0759) FiO2 (%):  [40 %] 40 % (04/03 1711) Last BM Date: 08/27/13 (per report)  Intake/Output from previous day: 04/03 0701 - 04/04 0700 In: 2475 [I.V.:2100; IV Piggyback:300] Out: 2470 [Urine:2230; Drains:240] Intake/Output this shift:    General appearance: no distress Resp: occ end exp wheezes Cardio: regular rate and rhythm GI: wound dressed g tube draining drains with no real fluid in them right now, no bs  Lab Results:   Recent Labs  08/31/13 0257 08/31/13 0930  WBC 10.9* 11.1*  HGB 11.7* 11.5*  HCT 34.2* 33.6*  PLT 199 209   BMET  Recent Labs  08/31/13 0257 09/01/13 0305  NA 137 141  K 3.7 3.6*  CL 101 103  CO2 22 22  GLUCOSE 125* 119*  BUN 12 10  CREATININE 1.15 1.11  CALCIUM 8.2* 8.0*   PT/INR  Recent Labs  08/30/13 0904 08/31/13 0257  LABPROT 15.4* 15.3*  INR 1.25 1.24   ABG No results found for this basename: PHART, PCO2, PO2, HCO3,  in the last 72 hours  Studies/Results: No results found.  Anti-infectives: Anti-infectives   Start     Dose/Rate Route Frequency Ordered Stop   08/28/13 1830  piperacillin-tazobactam (ZOSYN) IVPB 3.375 g     3.375 g 12.5 mL/hr over 240 Minutes Intravenous 3 times per day 08/28/13 1616     08/28/13 1200  fluconazole (DIFLUCAN) IVPB 100 mg     100 mg 50 mL/hr over 60 Minutes Intravenous Every 24 hours 08/28/13 0927     08/28/13 1030  piperacillin-tazobactam (ZOSYN) IVPB 3.375 g     3.375 g 100 mL/hr over 30 Minutes Intravenous  Once 08/28/13 1025 08/28/13 1122   08/28/13 1000  piperacillin-tazobactam (ZOSYN) IVPB 3.375 g  Status:  Discontinued     3.375 g 12.5 mL/hr over 240 Minutes  Intravenous 3 times per day 08/28/13 0927 08/28/13 1026      Assessment/Plan: POD 4 graham patch, gastrostomy, egd  1. COPD - CCM following, will give some lasix today as may help, oob with aggressive pulm toilet 2. Gi- 1 drain bilious so continue G-tube to gravity not really any output I can see this am though hopefully will seal on own, ice only  3. Id- d5/7 diflucan/zosyn  4. VTE - cont lovenox, will need consider if he needs therapeutic ac with pulmonary as apparently they were considering stopping this  5. Remain stepdown today LOS: 3 days    [facility] 09/01/2013

## 2013-09-01 NOTE — Progress Notes (Deleted)
Pt displayed unsteady gait during ambulation to bed side commode/chair.  Nurse to communicated with MD request for PT evaluation prior to ambulation for pt at this time due to event.

## 2013-09-02 ENCOUNTER — Inpatient Hospital Stay (HOSPITAL_COMMUNITY): Payer: Medicare Other

## 2013-09-02 LAB — BASIC METABOLIC PANEL
BUN: 11 mg/dL (ref 6–23)
CO2: 24 meq/L (ref 19–32)
CREATININE: 1.09 mg/dL (ref 0.50–1.35)
Calcium: 8.2 mg/dL — ABNORMAL LOW (ref 8.4–10.5)
Chloride: 102 mEq/L (ref 96–112)
GFR calc non Af Amer: 63 mL/min — ABNORMAL LOW (ref >90)
GFR, EST AFRICAN AMERICAN: 73 mL/min — AB (ref >90)
Glucose, Bld: 118 mg/dL — ABNORMAL HIGH (ref 70–99)
Potassium: 3.8 mEq/L (ref 3.7–5.3)
SODIUM: 138 meq/L (ref 137–147)

## 2013-09-02 NOTE — Progress Notes (Signed)
ANTIBIOTIC CONSULT NOTE - INITIAL  Pharmacy Consult for Zosyn Indication: Sepsis  Allergies  Allergen Reactions  . Mometasone Furoate Other (See Comments)    CAUSED RED,ITCHY EYES; "lost my eyelashes" and Omnaris--nose bleed.  . Naproxen Sodium Nausea And Vomiting    "thought I was going to die"  . Anoro Ellipta [Umeclidinium-Vilanterol]     Double vision  . [name] [Aclidinium Bromide]     Urination issues   Patient Measurements: Height: 5\' 8"  (172.7 cm) Weight: 176 lb 6.4 oz (80.015 kg) IBW/kg (Calculated) : 68.4  Vital Signs: Temp: 98.1 F (36.7 C) (04/05 0800) Temp src: Oral (04/05 0800) BP: 119/61 mmHg (04/05 1000) Pulse Rate: 89 (04/05 0401)     Labs:  Recent Labs  08/31/13 0257 08/31/13 0930 09/01/13 0305 09/02/13 0320  WBC 10.9* 11.1*  --   --   HGB 11.7* 11.5*  --   --   PLT 199 209  --   --   CREATININE 1.15  --  1.11 1.09   Estimated Creatinine Clearance: 54 ml/min (by C-G formula based on Cr of 1.09).  Microbiology: Recent Results (from the past 720 hour(s))  MRSA PCR SCREENING     Status: None   Collection Time    08/28/13  3:11 PM      Result Value Ref Range Status   MRSA by PCR NEGATIVE  NEGATIVE Final   Comment:            The GeneXpert MRSA Assay (FDA     approved for NASAL specimens     only), is one component of a     comprehensive MRSA colonization     surveillance program. It is not     intended to diagnose MRSA     infection nor to guide or     monitor treatment for     MRSA infections.    Medical History: Past Medical History  Diagnosis Date  . Prostate cancer 1992    in remission.  s/p XRT  . Emphysema   . Allergic rhinitis   . Asthma   . COPD (chronic obstructive pulmonary disease)   . Recurrent upper respiratory infection (URI)     coughing from chronic sinusitis   . Normal cardiac stress test     reports stress test was several yrs. ago /w [employer]. Dr. Rogelia Mire, WNL   . OSA (obstructive sleep apnea) 2007     CPAP- report of settings on paper chart, seen by [facility] - seen 07/2011   . Renal stone     some passed spontaneous, & also had cystoscopy  . Arthritis     hands  . Neuromuscular disorder     reports shaking, somedays "can't hold spoon" - generalized  shakiness, is going to talk to MD about it at next appt.   . High cholesterol   . Shortness of breath on exertion   . H/O hiatal hernia   . Osteoarthritis, generalized   . Chronic renal insufficiency, stage III (moderate)   . Mixed hyperlipidemia   . PE (pulmonary embolism) 10/2011   Medications:  Anti-infectives   Start     Dose/Rate Route Frequency Ordered Stop   08/28/13 1830  piperacillin-tazobactam (ZOSYN) IVPB 3.375 g     3.375 g 12.5 mL/hr over 240 Minutes Intravenous 3 times per day 08/28/13 1616     08/28/13 1200  fluconazole (DIFLUCAN) IVPB 100 mg     100 mg 50 mL/hr over 60 Minutes Intravenous Every 24 hours  08/28/13 0927     08/28/13 1030  piperacillin-tazobactam (ZOSYN) IVPB 3.375 g     3.375 g 100 mL/hr over 30 Minutes Intravenous  Once 08/28/13 1025 08/28/13 1122   08/28/13 1000  piperacillin-tazobactam (ZOSYN) IVPB 3.375 g  Status:  Discontinued     3.375 g 12.5 mL/hr over 240 Minutes Intravenous 3 times per day 08/28/13 0932 08/28/13 1026     Assessment: 42 yom who is s/p exploratory lap for perforated duodenal ulcer.  Patient was started on fluconazole for empiric post abdominal surgery and started on Zosyn due to necrosis and severe sepsis in the setting of his peritonitis.  Pt's Scr has improved to 1.09 with a Crcl of ~54 ml/min, and a UOP of 2.1 ml/kg/hr.  Currently patient is afebrile with Tmax 98.1 and WBC 11.1. Patient has no cultures sent, today is Day #6/7  Goal of Therapy:  Therapeutic response to IV antibiotics  Plan:  1.  Continue Zosyn 3.375 gm IV every 8 hours. 2.  Monitor renal function and UOP  Ugonna Keirsey B. Leitha Schuller, PharmD Clinical Pharmacist - Resident Phone: [phone] Pager:  [phone] 09/02/2013 10:22 AM

## 2013-09-02 NOTE — Progress Notes (Signed)
5 Days Post-Op  Subjective: BMX2 yesterday  Objective: Vital signs in last 24 hours: Temp:  [98 F (36.7 C)-99.3 F (37.4 C)] 98.1 F (36.7 C) (04/05 0800) Pulse Rate:  [79-89] 89 (04/05 0401) Resp:  [13-26] 16 (04/05 0800) BP: (117-147)/(68-85) 122/80 mmHg (04/05 0800) SpO2:  [94 %-99 %] 97 % (04/05 0401) Last BM Date: 09/01/13  Intake/Output from previous day: 04/04 0701 - 04/05 0700 In: 2450 [I.V.:2300; IV Piggyback:150] Out: 5040 [Urine:4100; Drains:840; Stool:100] Intake/Output this shift:    General appearance: alert and cooperative Resp: clear to auscultation bilaterally Cardio: regular rate and rhythm GI: soft, dressing just changed by staff, G tube draining well  Lab Results:   Recent Labs  08/31/13 0257 08/31/13 0930  WBC 10.9* 11.1*  HGB 11.7* 11.5*  HCT 34.2* 33.6*  PLT 199 209   BMET  Recent Labs  09/01/13 0305 09/02/13 0320  NA 141 138  K 3.6* 3.8  CL 103 102  CO2 22 24  GLUCOSE 119* 118*  BUN 10 11  CREATININE 1.11 1.09  CALCIUM 8.0* 8.2*   PT/INR  Recent Labs  08/30/13 0904 08/31/13 0257  LABPROT 15.4* 15.3*  INR 1.25 1.24   ABG No results found for this basename: PHART, PCO2, PO2, HCO3,  in the last 72 hours  Studies/Results: No results found.  Anti-infectives: Anti-infectives   Start     Dose/Rate Route Frequency Ordered Stop   08/28/13 1830  piperacillin-tazobactam (ZOSYN) IVPB 3.375 g     3.375 g 12.5 mL/hr over 240 Minutes Intravenous 3 times per day 08/28/13 1616     08/28/13 1200  fluconazole (DIFLUCAN) IVPB 100 mg     100 mg 50 mL/hr over 60 Minutes Intravenous Every 24 hours 08/28/13 0927     08/28/13 1030  piperacillin-tazobactam (ZOSYN) IVPB 3.375 g     3.375 g 100 mL/hr over 30 Minutes Intravenous  Once 08/28/13 1025 08/28/13 1122   08/28/13 1000  piperacillin-tazobactam (ZOSYN) IVPB 3.375 g  Status:  Discontinued     3.375 g 12.5 mL/hr over 240 Minutes Intravenous 3 times per day 08/28/13 0927 08/28/13  1026      Assessment/Plan: POD 5 graham patch, gastrostomy, egd  1. COPD - CCM following, oob with aggressive pulm toilet 2. Gi- 1 drain bilious so continue G-tube to gravity today 3. Id- d6/7 diflucan/zosyn  4. VTE - cont lovenox, will need consider if he needs therapeutic ac with pulmonary as apparently they were considering stopping this  5. Remain stepdown today 6. Consider TNA tomorrow  LOS: 5 days    Sarin Comunale E 09/02/2013

## 2013-09-02 NOTE — ED Provider Notes (Signed)
I saw and evaluated the patient, reviewed the resident's note and I agree with the findings and plan.   .Face to face Exam:  General:  Awake HEENT:  Atraumatic Resp:  Normal effort Abd:  Nondistended Neuro:No focal weakness  Discussed and reviewed EKG with resident.  After treatment in the ED the patient feels back to baseline and wants to go home.  Dot Lanes, MD 09/02/13 2239

## 2013-09-03 ENCOUNTER — Ambulatory Visit: Payer: Medicare Other

## 2013-09-03 ENCOUNTER — Inpatient Hospital Stay (HOSPITAL_COMMUNITY): Payer: Medicare Other

## 2013-09-03 DIAGNOSIS — Z7901 Long term (current) use of anticoagulants: Secondary | ICD-10-CM

## 2013-09-03 LAB — CBC
HCT: 35.1 % — ABNORMAL LOW (ref 39.0–52.0)
HEMATOCRIT: 35.3 % — AB (ref 39.0–52.0)
HEMOGLOBIN: 12.1 g/dL — AB (ref 13.0–17.0)
Hemoglobin: 12 g/dL — ABNORMAL LOW (ref 13.0–17.0)
MCH: 31 pg (ref 26.0–34.0)
MCH: 30.9 pg (ref 26.0–34.0)
MCHC: 34.2 g/dL (ref 30.0–36.0)
MCHC: 34.3 g/dL (ref 30.0–36.0)
MCV: 90.7 fL (ref 78.0–100.0)
MCV: 90.1 fL (ref 78.0–100.0)
Platelets: 314 10*3/uL (ref 150–400)
Platelets: 320 10*3/uL (ref 150–400)
RBC: 3.87 MIL/uL — AB (ref 4.22–5.81)
RBC: 3.92 MIL/uL — ABNORMAL LOW (ref 4.22–5.81)
RDW: 15 % (ref 11.5–15.5)
RDW: 14.9 % (ref 11.5–15.5)
WBC: 13.1 10*3/uL — AB (ref 4.0–10.5)
WBC: 14.7 10*3/uL — AB (ref 4.0–10.5)

## 2013-09-03 LAB — BASIC METABOLIC PANEL
BUN: 11 mg/dL (ref 6–23)
BUN: 12 mg/dL (ref 6–23)
CHLORIDE: 99 meq/L (ref 96–112)
CHLORIDE: 99 meq/L (ref 96–112)
CO2: 23 meq/L (ref 19–32)
CO2: 23 mEq/L (ref 19–32)
Calcium: 8.5 mg/dL (ref 8.4–10.5)
Calcium: 8.7 mg/dL (ref 8.4–10.5)
Creatinine, Ser: 1.1 mg/dL (ref 0.50–1.35)
Creatinine, Ser: 1.22 mg/dL (ref 0.50–1.35)
GFR calc non Af Amer: 62 mL/min — ABNORMAL LOW (ref >90)
GFR calc non Af Amer: 55 mL/min — ABNORMAL LOW (ref >90)
GFR, EST AFRICAN AMERICAN: 72 mL/min — AB (ref >90)
GFR, EST AFRICAN AMERICAN: 64 mL/min — AB (ref >90)
Glucose, Bld: 126 mg/dL — ABNORMAL HIGH (ref 70–99)
Glucose, Bld: 132 mg/dL — ABNORMAL HIGH (ref 70–99)
POTASSIUM: 4.4 meq/L (ref 3.7–5.3)
POTASSIUM: 4.3 meq/L (ref 3.7–5.3)
Sodium: 136 mEq/L — ABNORMAL LOW (ref 137–147)
Sodium: 139 mEq/L (ref 137–147)

## 2013-09-03 MED ORDER — IOHEXOL 300 MG/ML  SOLN
150.0000 mL | Freq: Once | INTRAMUSCULAR | Status: AC | PRN
  Administered 2013-09-03: 120 mL via ORAL

## 2013-09-03 NOTE — Progress Notes (Signed)
[facility] Progress Note 6 Days Post-Op  Subjective: Patient wants to eat.  Had abdominal discomfort with G-tube not draining well.  Needs to be flushed.  Objective: Vital signs in last 24 hours: Temp:  [97.8 F (36.6 C)-98.7 F (37.1 C)] 98.7 F (37.1 C) (04/06 0430) Pulse Rate:  [87-100] 100 (04/06 0430) Resp:  [8-25] 17 (04/06 0531) BP: (109-157)/(60-94) 109/68 mmHg (04/06 0430) SpO2:  [96 %-98 %] 97 % (04/06 0531) Last BM Date: 09/01/13  Intake/Output from previous day: 04/05 0701 - 04/06 0700 In: 2027.5 [I.V.:1727.5; IV Piggyback:300] Out: 9562 [Urine:775; Drains:370] Intake/Output this shift:    General: No acute distress.  Has had a couple of bowel movements..  Amount of drainage from [facility] drain has significantly decreased.  Needs UGI study.  Lungs: Clear although patient seems SOB.  Sats are okay  Abd: Mildly distended, hypoactive bowel sounds.  Extremities: No changes  Neuro: Intact  Lab Results:  @LABLAST2 (wbc:2,hgb:2,hct:2,plt:2) BMET  Recent Labs  09/02/13 0320 09/03/13 0345  NA 138 136*  K 3.8 4.4  CL 102 99  CO2 24 23  GLUCOSE 118* 126*  BUN 11 11  CREATININE 1.09 1.10  CALCIUM 8.2* 8.5   PT/INR No results found for this basename: LABPROT, INR,  in the last 72 hours ABG No results found for this basename: PHART, PCO2, PO2, HCO3,  in the last 72 hours  Studies/Results: Dg Abd Portable 2v  09/02/2013   CLINICAL DATA:  Severe abdominal pain.  EXAM: PORTABLE ABDOMEN - 2 VIEW  COMPARISON:  CT of the abdomen and pelvis from 08/28/2013, and abdominal radiograph performed 04/02/2010  FINDINGS: The visualized bowel gas pattern is unremarkable. Scattered air and contrast filled loops of colon are seen; no abnormal dilatation of small bowel loops is seen to suggest small bowel obstruction. No free intra-abdominal air is identified on the provided decubitus view.  Tubes overlying the upper abdomen are new from the recent prior study, and of uncertain  significance. One of these appears to be a G-tube, ending overlying the stomach, though these are difficult to assess.  The visualized osseous structures are within normal limits; the sacroiliac joints are unremarkable in appearance. The visualized lung bases are essentially clear.  IMPRESSION: Unremarkable bowel gas pattern; no free intra-abdominal air seen. Contrast has progressed to the level of the descending and sigmoid colon, without evidence of obstruction.   Electronically Signed   By: Garald Balding M.D.   On: 09/02/2013 21:20    Anti-infectives: Anti-infectives   Start     Dose/Rate Route Frequency Ordered Stop   08/28/13 1830  piperacillin-tazobactam (ZOSYN) IVPB 3.375 g     3.375 g 12.5 mL/hr over 240 Minutes Intravenous 3 times per day 08/28/13 1616     08/28/13 1200  fluconazole (DIFLUCAN) IVPB 100 mg     100 mg 50 mL/hr over 60 Minutes Intravenous Every 24 hours 08/28/13 0927     08/28/13 1030  piperacillin-tazobactam (ZOSYN) IVPB 3.375 g     3.375 g 100 mL/hr over 30 Minutes Intravenous  Once 08/28/13 1025 08/28/13 1122   08/28/13 1000  piperacillin-tazobactam (ZOSYN) IVPB 3.375 g  Status:  Discontinued     3.375 g 12.5 mL/hr over 240 Minutes Intravenous 3 times per day 08/28/13 0927 08/28/13 1026      Assessment/Plan: s/p Procedure(s): EXPLORATORY LAPAROTOMY ESOPHAGOGASTRODUODENOSCOPY (EGD) DUODENAL ULCER PATCH GASTROSTOMY Get water contrast UGI study to see if he is still leaking, if so then will need to start TPN.  If not, will  try clear liquids as long as the patient is not obstructed.  LOS: 6 days   Kathryne Eriksson. [name], MD, FACS [phone] [facility] Surgery 09/03/2013

## 2013-09-03 NOTE — Progress Notes (Signed)
PT Cancellation Note  Patient Details Name: Jon White MRN: [id number] DOB: 1932/09/30   Cancelled Treatment:    Reason Eval/Treat Not Completed: Patient at procedure or test/unavailable (pt OOR and unable to participate at this time)   Melford Aase 09/03/2013, 10:58 AM Elwyn Reach, [place]

## 2013-09-03 NOTE — Progress Notes (Signed)
PULMONARY / CRITICAL CARE MEDICINE .nx  Name: Jon White MRN: [id number] DOB: 1932/08/30    ADMISSION DATE:  08/28/2013 CONSULTATION DATE:  08/28/2013  REFERRING MD :  Donne Hazel  PRIMARY SERVICE: General Surgery  CHIEF COMPLAINT:  COPD/PE, assist with post-op management   BRIEF PATIENT DESCRIPTION:   80 yo former smoker, f/b MR for COPD Stage II/Class B and remote PE in 2013 (on coumadin), also [place] for OSA (CPAP 12 cm H2O). Admitted on 3/31 w/ perforated duodenal ulcer, repaired on 3/31. PCCM was asked to see pre/post-op given concern about his multiple pulmonary comorbids.  SIGNIFICANT EVENTS / STUDIES:  3/31 CT abd/pelvis findings suspicious for duodenal ulceration with perforation. Exploratory laparotomy surgery revealed perforated duodenal ulcer that was repaired with Phillip Heal patch with minimal  blood loss. Pt required intubation. G tube and drains placed. 4/1 Extubated successfully 4/2  Stable respiratory status, to transfer to SDU. Abdominal pain controlled on PCA fentanyl. Drain with high output bile.   LINES / TUBES: ET Tube 3/31>>4/1 G-tube 3/31 >>   CULTURES: MRSA PCR Neg 3/31  ANTIBIOTICS: Zosyn 3/31>>  Diflucan 3/31>>   SUBJECTIVE: pt denies any breathing issues.  Using IS and getting oob, currently walking with physical therapy.  VITAL SIGNS: Temp:  [97.8 F (36.6 C)-98.7 F (37.1 C)] 98.5 F (36.9 C) (04/06 1201) Pulse Rate:  [87-100] 96 (04/06 1201) Resp:  [8-25] 21 (04/06 1235) BP: (109-157)/(60-94) 124/65 mmHg (04/06 1201) SpO2:  [96 %-100 %] 97 % (04/06 1235) HEMODYNAMICS:   VENTILATOR SETTINGS:   INTAKE / OUTPUT: Intake/Output     04/05 0701 - 04/06 0700 04/06 0701 - 04/07 0700   I.V. (mL/kg) 1802.5 (22.5) 225 (2.8)   IV Piggyback 300    Total Intake(mL/kg) 2102.5 (26.3) 225 (2.8)   Urine (mL/kg/hr) 775 (0.4) 900 (1.9)   Drains 370 (0.2)    Stool     Total Output 1145 900   Net +957.5 -675          PHYSICAL EXAMINATION: General:  overweight male, nad Neuro: alert, moves all 4.  HEENT: nose without purulence or d/c, neck without LN or TMG Cardiovascular: RRR, no murmur  Lungs: smallish lung volumes, a few basilar crackles, no wheezing. Abdomen: prominent, mild tenderness Ext: SCD's   ASSESSMENT / PLAN:  PULMONARY A: Acute Respiratory Failure s/p lap for perforated duodenal ulcer w/ repair and G tube placement  GOLD stage II/ class B COPD OSA Remote PE (2013)  - Pt is stable currently from a pulmonary standpoint.  Would focus on IS, and mobilization as allowed by surgery.  No change in breathing meds thru the w/e.  P:  - Oxygen PRN - currently on RA. - Lovenox - will need to be started on coumadin or xeralto for PE but will defer when to start to surgery given recent surgery. - SCD's. - Pulmonary toilet with OOB, IS. - CPAP at night.  HEMATOLOGIC A: Supra therapeutic INR on coumadin s/p 6 FFU and vitamin K - Resolved Normocytic Anemia, likely dilutional   History of  PE (2013) History of Prostate Ca (>20 yrs) P:  - Anticoagulation plan as above. - SCD's and lovenox for DVT prophylaxis until able to take full anti-coagulation.  Rush Farmer, M.D. [facility] Pulmonary/Critical Care Medicine. Pager: [phone]. After hours pager: [phone].

## 2013-09-03 NOTE — Progress Notes (Signed)
Physical Therapy Treatment Patient Details Name: PASQUALE MATTERS MRN: [id number] DOB: [birth year] Today's Date: 09/03/2013    History of Present Illness 80 yo former smoker, f/b MR for COPD Stage II/Class B and remote PE in 2013 (on coumadin), also [place] for OSA (CPAP 12 cm H2O). Admitted on 3/31 w/ perforated duodenal ulcer, repaired on 3/31    PT Comments    Pt progressing with gait with slow speed and need for standing rests. Pt fatigued after ambulation and encouraged to perform HEp after resting as well as to continue OOB daily with staff. Will continue to follow.   Follow Up Recommendations  Home health PT;Supervision/Assistance - 24 hour     Equipment Recommendations  Rolling walker with 5" wheels    Recommendations for Other Services       Precautions / Restrictions Precautions Precautions: Fall Precaution Comments: drain, 2 jp drain    Mobility  Bed Mobility Overal bed mobility: Needs Assistance Bed Mobility: Rolling;Sidelying to Sit Rolling: Supervision Sidelying to sit: Min guard       General bed mobility comments: cues for sequence to ease transition and pain and assist to fully elevate trunk. Initial posterior lean with EOB today with tactile cues to correct  Transfers     Transfers: Sit to/from Stand Sit to Stand: Min assist         General transfer comment: cues for hand placement and safety with assist for anterior translation  Ambulation/Gait Ambulation/Gait assistance: Min guard Ambulation Distance (Feet): 200 Feet Assistive device: Rolling walker (2 wheeled) Gait Pattern/deviations: Step-through pattern;Decreased stride length   Gait velocity interpretation: <1.8 ft/sec, indicative of risk for recurrent falls General Gait Details: 4 standing rests during gait, cues for posture and position in RW, increased stride as well as breathing technique   Stairs            Wheelchair Mobility    Modified Rankin (Stroke Patients Only)       Balance                                    Cognition Arousal/Alertness: Awake/alert Behavior During Therapy: WFL for tasks assessed/performed Overall Cognitive Status: Within Functional Limits for tasks assessed                      Exercises      General Comments        Pertinent Vitals/Pain No pain HR 123-131 with gait sats 94% on 3L with dyspnea 1-2/4    Home Living                      Prior Function            PT Goals (current goals can now be found in the care plan section) Progress towards PT goals: Progressing toward goals    Frequency       PT Plan Current plan remains appropriate    Co-evaluation             End of Session Equipment Utilized During Treatment: Oxygen Activity Tolerance: Patient tolerated treatment well Patient left: in chair;with call bell/phone within reach;with nursing/sitter in room;with family/visitor present     Time: 1219-1249 PT Time Calculation (min): 30 min  Charges:  $Gait Training: 8-22 mins $Therapeutic Activity: 8-22 mins  G CodesMelford Aase 09-13-2013, 12:59 PM Elwyn Reach, [place]

## 2013-09-04 DIAGNOSIS — J329 Chronic sinusitis, unspecified: Secondary | ICD-10-CM

## 2013-09-04 LAB — BASIC METABOLIC PANEL
BUN: 13 mg/dL (ref 6–23)
CALCIUM: 8.2 mg/dL — AB (ref 8.4–10.5)
CO2: 23 meq/L (ref 19–32)
Chloride: 100 mEq/L (ref 96–112)
Creatinine, Ser: 1.32 mg/dL (ref 0.50–1.35)
GFR calc Af Amer: 58 mL/min — ABNORMAL LOW (ref >90)
GFR calc non Af Amer: 50 mL/min — ABNORMAL LOW (ref >90)
GLUCOSE: 118 mg/dL — AB (ref 70–99)
Potassium: 4.1 mEq/L (ref 3.7–5.3)
SODIUM: 138 meq/L (ref 137–147)

## 2013-09-04 LAB — CBC WITH DIFFERENTIAL/PLATELET
BASOS PCT: 1 % (ref 0–1)
Basophils Absolute: 0.2 10*3/uL — ABNORMAL HIGH (ref 0.0–0.1)
Eosinophils Absolute: 0.2 10*3/uL (ref 0.0–0.7)
Eosinophils Relative: 1 % (ref 0–5)
HCT: 31.6 % — ABNORMAL LOW (ref 39.0–52.0)
HEMOGLOBIN: 10.9 g/dL — AB (ref 13.0–17.0)
LYMPHS PCT: 11 % — AB (ref 12–46)
Lymphs Abs: 1.7 10*3/uL (ref 0.7–4.0)
MCH: 31.6 pg (ref 26.0–34.0)
MCHC: 34.5 g/dL (ref 30.0–36.0)
MCV: 91.6 fL (ref 78.0–100.0)
Monocytes Absolute: 2.2 10*3/uL — ABNORMAL HIGH (ref 0.1–1.0)
Monocytes Relative: 14 % — ABNORMAL HIGH (ref 3–12)
Neutro Abs: 11.2 10*3/uL — ABNORMAL HIGH (ref 1.7–7.7)
Neutrophils Relative %: 73 % (ref 43–77)
Platelets: 339 10*3/uL (ref 150–400)
RBC: 3.45 MIL/uL — ABNORMAL LOW (ref 4.22–5.81)
RDW: 15.4 % (ref 11.5–15.5)
WBC: 15.5 10*3/uL — ABNORMAL HIGH (ref 4.0–10.5)

## 2013-09-04 MED ORDER — BUDESONIDE-FORMOTEROL FUMARATE 160-4.5 MCG/ACT IN AERO
2.0000 | INHALATION_SPRAY | Freq: Two times a day (BID) | RESPIRATORY_TRACT | Status: DC
  Administered 2013-09-04 – 2013-09-07 (×5): 2 via RESPIRATORY_TRACT
  Filled 2013-09-04: qty 6

## 2013-09-04 MED ORDER — LORATADINE 10 MG PO TABS
10.0000 mg | ORAL_TABLET | Freq: Every day | ORAL | Status: DC
  Administered 2013-09-04 – 2013-09-07 (×4): 10 mg via ORAL
  Filled 2013-09-04 (×5): qty 1

## 2013-09-04 MED ORDER — ALBUTEROL SULFATE (2.5 MG/3ML) 0.083% IN NEBU: 2.5000 mg | INHALATION_SOLUTION | RESPIRATORY_TRACT | Status: DC | PRN

## 2013-09-04 NOTE — Progress Notes (Signed)
7 Days Post-Op  Subjective: He's doing pretty well.    Objective: Vital signs in last 24 hours: Temp:  [98 F (36.7 C)-100.9 F (38.3 C)] 98 F (36.7 C) (04/07 0752) Pulse Rate:  [95-125] 105 (04/07 0752) Resp:  [13-22] 16 (04/07 0752) BP: (110-125)/(45-76) 125/61 mmHg (04/07 0752) SpO2:  [95 %-100 %] 97 % (04/07 0838) FiO2 (%):  [28 %] 28 % (04/07 0838) Weight:  [80.015 kg (176 lb 6.4 oz)] 80.015 kg (176 lb 6.4 oz) (04/07 0424) Last BM Date: 09/02/13 Nothing PO recorded. Pt still NPO 300 from 1 drain and 20 from 2nd drain  Intake/Output from previous day: 04/06 0701 - 04/07 0700 In: 2025 [I.V.:1725; IV Piggyback:300] Out: 2121 [Urine:1800; Drains:320; Stool:1] Intake/Output this shift: Total I/O In: 202 [Other:202] Out: 200 [Urine:200]  General appearance: alert, cooperative and no distress Resp: clear to auscultation bilaterally GI: not many bowel sounds but he had a BM.  open wound looks good, some bilious looking fluid in the one drain, but neither jp has drained much.  +BM  Lab Results:   Recent Labs  09/03/13 0945 09/04/13 0520  WBC 14.7* 15.5*  HGB 12.1* 10.9*  HCT 35.3* 31.6*  PLT 320 339    BMET  Recent Labs  09/03/13 0945 09/04/13 0520  NA 139 138  K 4.3 4.1  CL 99 100  CO2 23 23  GLUCOSE 132* 118*  BUN 12 13  CREATININE 1.22 1.32  CALCIUM 8.7 8.2*   PT/INR No results found for this basename: LABPROT, INR,  in the last 72 hours  No results found for this basename: AST, ALT, ALKPHOS, BILITOT, PROT, ALBUMIN,  in the last 168 hours   Lipase     Component Value Date/Time   LIPASE 17 08/28/2013 0443     Studies/Results: Dg Abd Portable 2v  09/02/2013   CLINICAL DATA:  Severe abdominal pain.  EXAM: PORTABLE ABDOMEN - 2 VIEW  COMPARISON:  CT of the abdomen and pelvis from 08/28/2013, and abdominal radiograph performed 04/02/2010  FINDINGS: The visualized bowel gas pattern is unremarkable. Scattered air and contrast filled loops of colon are  seen; no abnormal dilatation of small bowel loops is seen to suggest small bowel obstruction. No free intra-abdominal air is identified on the provided decubitus view.  Tubes overlying the upper abdomen are new from the recent prior study, and of uncertain significance. One of these appears to be a G-tube, ending overlying the stomach, though these are difficult to assess.  The visualized osseous structures are within normal limits; the sacroiliac joints are unremarkable in appearance. The visualized lung bases are essentially clear.  IMPRESSION: Unremarkable bowel gas pattern; no free intra-abdominal air seen. Contrast has progressed to the level of the descending and sigmoid colon, without evidence of obstruction.   Electronically Signed   By: Garald Balding M.D.   On: 09/02/2013 21:20   Dg Ugi W/water Sol Cm  09/03/2013   CLINICAL DATA:  History of perforated duodenum ulcer. Evaluate for possible leak.  EXAM: WATER SOLUBLE UPPER GI SERIES  TECHNIQUE: Single-column upper GI series was performed using water soluble contrast.  CONTRAST:  117mL OMNIPAQUE IOHEXOL 300 MG/ML  SOLN  COMPARISON:  CT scan 09/02/2013  FLUOROSCOPY TIME:  1 min and 55 seconds  FINDINGS: Contrast was instilled through the existing gastrostomy tube. No leaking contrast was identified. There is a wide mouthed duodenum diverticulum along the anti mesenteric border. This is unusual but it does correlate with the CT findings. This is  in the descending duodenum just below the duodenum bulb. The stomach is unremarkable. Moderate GE reflux was demonstrated. This may explain the thickened esophagus on the CT scan.  IMPRESSION: Duodenum diverticulum.  No leaking oral contrast.  GE reflux may account for the esophageal wall thickening seen on CT scan.   Electronically Signed   By: Kalman Jewels M.D.   On: 09/03/2013 15:46    Medications: . antiseptic oral rinse  15 mL Mouth Rinse BID  . budesonide  0.25 mg Nebulization Q6H  . enoxaparin  (LOVENOX) injection  40 mg Subcutaneous Q24H  . famotidine (PEPCID) IV  20 mg Intravenous Q12H  . fentaNYL   Intravenous 6 times per day  . fluconazole (DIFLUCAN) IV  100 mg Intravenous Q24H  . ipratropium-albuterol  3 mL Nebulization Q6H  . piperacillin-tazobactam (ZOSYN)  IV  3.375 g Intravenous 3 times per day    Assessment/Plan 1. Perforated viscus, likely duodenal ulcer, ? Mass S/p 1 exploratory laparotomy with Phillip Heal patch of perforated duodenal ulcer  #224 [place] gastrostomy tube placement  #3 EGD, 08/28/2013, Rolm Bookbinder, MD [facility] 09/03/13:  Duodenum diverticulum.  No leaking oral contrast.  GE reflux may account for the esophageal wall thickening seen on CT scan. 2. COPD, secondary to tobacco abuse  3. OSA  4. H/o PE on coumadin  5. Therapeutic INR  6. H/o prostate cancer  7. Hyperlipidemia    PLAN: We are going to cap the gastrostomy tube and start, some clears.  Go slow. He ask about his preadmission pulmonary medicines and I will defer that to CCM. He has had 7 days of diflucan and Zosyn.  How long do we need to continue this?  LOS: 7 days    Random Dobrowski 09/04/2013

## 2013-09-04 NOTE — Progress Notes (Addendum)
PULMONARY / CRITICAL CARE MEDICINE .nx  Name: Jon White MRN: [id number] DOB: [birth year]    ADMISSION DATE:  08/28/2013 CONSULTATION DATE:  08/28/2013  REFERRING MD :  Donne Hazel  PRIMARY SERVICE: General Surgery  CHIEF COMPLAINT:  COPD/PE, assist with post-op management   BRIEF PATIENT DESCRIPTION:   80 yo former smoker, f/b MR for COPD Stage II/Class B and remote PE in 2013 (on coumadin), also [place] for OSA (CPAP 12 cm H2O). Admitted on 3/31 w/ perforated duodenal ulcer, repaired on 3/31. PCCM was asked to see pre/post-op given concern about his multiple pulmonary comorbids.  SIGNIFICANT EVENTS / STUDIES:  3/31 CT abd/pelvis findings suspicious for duodenal ulceration with perforation. Exploratory laparotomy surgery revealed perforated duodenal ulcer that was repaired with Phillip Heal patch with minimal  blood loss. Pt required intubation. G tube and drains placed. 4/1 Extubated successfully 4/2  Stable respiratory status, to transfer to SDU. Abdominal pain controlled on PCA fentanyl. Drain with high output bile.   LINES / TUBES: ET Tube 3/31>>4/1 G-tube 3/31 >>   CULTURES: MRSA PCR Neg 3/31  ANTIBIOTICS: Zosyn 3/31>>  Diflucan 3/31>>   SUBJECTIVE: pt denies any breathing issues.  Using IS and getting oob, currently walking with physical therapy.  VITAL SIGNS: Temp:  [98 F (36.7 C)-100.9 F (38.3 C)] 98 F (36.7 C) (04/07 0752) Pulse Rate:  [95-125] 105 (04/07 0752) Resp:  [13-22] 17 (04/07 0855) BP: (110-125)/(45-76) 125/61 mmHg (04/07 0752) SpO2:  [95 %-98 %] 96 % (04/07 0855) FiO2 (%):  [28 %] 28 % (04/07 0838) Weight:  [176 lb 6.4 oz (80.015 kg)] 176 lb 6.4 oz (80.015 kg) (04/07 0424) HEMODYNAMICS:   VENTILATOR SETTINGS: Vent Mode:  [-]  FiO2 (%):  [28 %] 28 % INTAKE / OUTPUT: Intake/Output     04/06 0701 - 04/07 0700 04/07 0701 - 04/08 0700   I.V. (mL/kg) 1725 (21.6)    Other 104 202   IV Piggyback 300    Total Intake(mL/kg) 2129 (26.6) 202 (2.5)   Urine  (mL/kg/hr) 1800 (0.9) 200 (0.5)   Drains 320 (0.2)    Stool 1 (0)    Total Output 2121 200   Net +8 +2        Stool Occurrence  2 x     PHYSICAL EXAMINATION: General: overweight male, nad Neuro: alert, moves all 4.  HEENT: nose without purulence or d/c, neck without LN or TMG Cardiovascular: RRR, no murmur  Lungs: smallish lung volumes, a few basilar crackles, no wheezing. Abdomen: prominent, mild tenderness Ext: SCD's   ASSESSMENT / PLAN:  PULMONARY A: Acute Respiratory Failure s/p lap for perforated duodenal ulcer w/ repair and G tube placement  GOLD stage II/ class B COPD OSA Remote PE (2013)  - Pt is stable currently from a pulmonary standpoint.  Would focus on IS, and mobilization as allowed by surgery.  No change in breathing meds thru the w/e.  P:  - Oxygen PRN - currently on RA, will titrate down as able. - Lovenox - will need to be started on coumadin or xeralto for PE but will defer when to start to surgery given recent surgery. - SCD's. - Pulmonary toilet with OOB, IS. - CPAP at night. - Restart symbicort 160/4.5. - On duonebs so will not start tudorza, once ready for discharge would d/c duonebs and start tudorza (not on formulary in [facility]). - PRN albuterol added. - Zyrtec restarted.  HEMATOLOGIC A: Supra therapeutic INR on coumadin s/p 6 FFU and vitamin K -  Resolved Normocytic Anemia, likely dilutional   History of  PE (2013) History of Prostate Ca (>20 yrs) P:  - Anticoagulation plan as above. - SCD's and lovenox for DVT prophylaxis until able to take full anti-coagulation.  Abx per surgery.  Would like surgery to determine when to start coumadin for PE.  Once ready recommend for pharmacy to assist with dosing.  Rush Farmer, M.D. [facility] Pulmonary/Critical Care Medicine. Pager: [phone]. After hours pager: [phone].

## 2013-09-04 NOTE — Progress Notes (Signed)
Clear liquid diet carefully.  Jon White. [name], MD, [place] [phone] [facility] Surgery

## 2013-09-04 NOTE — Progress Notes (Signed)
Physical Therapy Treatment Patient Details Name: ODA LANSDOWNE MRN: [id number] DOB: 1933/03/08 Today's Date: 09/04/2013    History of Present Illness 80 yo former smoker, f/b MR for COPD Stage II/Class B and remote PE in 2013 (on coumadin), also [place] for OSA (CPAP 12 cm H2O). Admitted on 3/31 w/ perforated duodenal ulcer, repaired on 3/31    PT Comments    Mr.Stiner with greatly improved endurance and activity today with cues for sequence and HEP. Pt encouraged to continue HEP and OOB daily with staff. Will continue to follow.  Sats dropping to 86% periodically on 3L with cues for breathing technique and at rest 97% on 2L. Educated for incentive spirometer.  Follow Up Recommendations  Home health PT;Supervision/Assistance - 24 hour     Equipment Recommendations  Rolling walker with 5" wheels    Recommendations for Other Services       Precautions / Restrictions Precautions Precautions: Fall Precaution Comments: drain, 2 jp drain    Mobility  Bed Mobility Overal bed mobility: Needs Assistance   Rolling: Modified independent (Device/Increase time) Sidelying to sit: Min assist       General bed mobility comments: cues for sequence to ease transition and pain and assist to fully elevate trunk.   Transfers Overall transfer level: Needs assistance   Transfers: Sit to/from Stand Sit to Stand: Min guard         General transfer comment: cues for hand placement and safety   Ambulation/Gait Ambulation/Gait assistance: Min guard Ambulation Distance (Feet): 200 Feet Assistive device: Rolling walker (2 wheeled) Gait Pattern/deviations: Step-through pattern;Decreased stride length   Gait velocity interpretation: Below normal speed for age/gender General Gait Details: 2 standing rests with gait with cues for position in RW, increased endurance and speed with gait today   Stairs            Wheelchair Mobility    Modified Rankin (Stroke Patients Only)        Balance                                    Cognition Arousal/Alertness: Awake/alert Behavior During Therapy: WFL for tasks assessed/performed Overall Cognitive Status: Within Functional Limits for tasks assessed                      Exercises General Exercises - Lower Extremity Long Arc Quad: AROM;Seated;Both;20 reps Hip Flexion/Marching: AROM;Seated;Both;20 reps Toe Raises: AROM;Seated;Both;20 reps Heel Raises: AROM;Seated;Both;20 reps    General Comments        Pertinent Vitals/Pain No pain HR 102 with gait    Home Living                      Prior Function            PT Goals (current goals can now be found in the care plan section) Progress towards PT goals: Progressing toward goals    Frequency       PT Plan Current plan remains appropriate    Co-evaluation             End of Session Equipment Utilized During Treatment: Oxygen Activity Tolerance: Patient tolerated treatment well Patient left: in chair;with call bell/phone within reach     Time: 0747-0812 PT Time Calculation (min): 25 min  Charges:  $Gait Training: 8-22 mins $Therapeutic Exercise: 8-22 mins  G CodesMelford Aase 09/26/13, 11:57 AM Elwyn Reach, [place]

## 2013-09-04 NOTE — Progress Notes (Signed)
ANTIBIOTIC CONSULT NOTE - FOLLOW UP  Pharmacy Consult for Zosyn Indication: r/o abdominal source of sepsis  Allergies  Allergen Reactions  . Mometasone Furoate Other (See Comments)    CAUSED RED,ITCHY EYES; "lost my eyelashes" and Omnaris--nose bleed.  . Naproxen Sodium Nausea And Vomiting    "thought I was going to die"  . Anoro Ellipta [Umeclidinium-Vilanterol]     Double vision  . [name] [Aclidinium Bromide]     Urination issues    Patient Measurements: Height: 5\' 8"  (172.7 cm) Weight: 176 lb 6.4 oz (80.015 kg) IBW/kg (Calculated) : 68.4  Vital Signs: Temp: 98 F (36.7 C) (04/07 0752) Temp src: Oral (04/07 0752) BP: 120/41 mmHg (04/07 1231) Pulse Rate: 85 (04/07 1231) Intake/Output from previous day: 04/06 0701 - 04/07 0700 In: 2204 [I.V.:1800; IV Piggyback:300] Out: 2121 [Urine:1800; Drains:320; Stool:1] Intake/Output from this shift: Total I/O In: 752 [I.V.:450; Other:202; IV Piggyback:100] Out: 200 [Urine:200]  Labs:  Recent Labs  09/03/13 0345 09/03/13 0945 09/04/13 0520  WBC 13.1* 14.7* 15.5*  HGB 12.0* 12.1* 10.9*  PLT 314 320 339  CREATININE 1.10 1.22 1.32   Estimated Creatinine Clearance: 44.6 ml/min (by C-G formula based on Cr of 1.32). No results found for this basename: VANCOTROUGH, Corlis Leak, VANCORANDOM, [place], GENTPEAK, GENTRANDOM, TOBRATROUGH, TOBRAPEAK, TOBRARND, AMIKACINPEAK, AMIKACINTROU, AMIKACIN,  in the last 72 hours   Microbiology: Recent Results (from the past 720 hour(s))  MRSA PCR SCREENING     Status: None   Collection Time    08/28/13  3:11 PM      Result Value Ref Range Status   MRSA by PCR NEGATIVE  NEGATIVE Final   Comment:            The GeneXpert MRSA Assay (FDA     approved for NASAL specimens     only), is one component of a     comprehensive MRSA colonization     surveillance program. It is not     intended to diagnose MRSA     infection nor to guide or     monitor treatment for     MRSA infections.     Anti-infectives   Start     Dose/Rate Route Frequency Ordered Stop   08/28/13 1830  piperacillin-tazobactam (ZOSYN) IVPB 3.375 g     3.375 g 12.5 mL/hr over 240 Minutes Intravenous 3 times per day 08/28/13 1616     08/28/13 1200  fluconazole (DIFLUCAN) IVPB 100 mg     100 mg 50 mL/hr over 60 Minutes Intravenous Every 24 hours 08/28/13 0927     08/28/13 1030  piperacillin-tazobactam (ZOSYN) IVPB 3.375 g     3.375 g 100 mL/hr over 30 Minutes Intravenous  Once 08/28/13 1025 08/28/13 1122   08/28/13 1000  piperacillin-tazobactam (ZOSYN) IVPB 3.375 g  Status:  Discontinued     3.375 g 12.5 mL/hr over 240 Minutes Intravenous 3 times per day 08/28/13 0927 08/28/13 1026      Assessment: 80 y.o. M who continues on Zosyn-Rx + Fluconazole-MD for empiric abdominal coverage to r/o sepsis with necrosis. Per surgery notes, plan was for 7 days of treatment -- this was completed on 4/7. Consider addressing LOT and discontinuing if appropriate. Dose remains appropriate at this time.   Goal of Therapy:  Proper antibiotics for infection/cultures adjusted for renal/hepatic function   Plan:  1. Continue Zosyn 3.375g IV every 8 hours 2. Will continue to follow renal function, culture results, LOT, and antibiotic de-escalation plans   Alycia Rossetti, PharmD, BCPS  Clinical Pharmacist Pager: [phone] 09/04/2013 2:35 PM

## 2013-09-05 LAB — CBC
HEMATOCRIT: 30.7 % — AB (ref 39.0–52.0)
HEMOGLOBIN: 10.5 g/dL — AB (ref 13.0–17.0)
MCH: 30.9 pg (ref 26.0–34.0)
MCHC: 34.2 g/dL (ref 30.0–36.0)
MCV: 90.3 fL (ref 78.0–100.0)
Platelets: 354 10*3/uL (ref 150–400)
RBC: 3.4 MIL/uL — ABNORMAL LOW (ref 4.22–5.81)
RDW: 15.5 % (ref 11.5–15.5)
WBC: 13.1 10*3/uL — ABNORMAL HIGH (ref 4.0–10.5)

## 2013-09-05 LAB — COMPREHENSIVE METABOLIC PANEL
ALBUMIN: 1.9 g/dL — AB (ref 3.5–5.2)
ALT: 42 U/L (ref 0–53)
AST: 36 U/L (ref 0–37)
Alkaline Phosphatase: 58 U/L (ref 39–117)
BUN: 11 mg/dL (ref 6–23)
CALCIUM: 8.4 mg/dL (ref 8.4–10.5)
CO2: 22 mEq/L (ref 19–32)
CREATININE: 1.3 mg/dL (ref 0.50–1.35)
Chloride: 104 mEq/L (ref 96–112)
GFR calc non Af Amer: 51 mL/min — ABNORMAL LOW (ref >90)
GFR, EST AFRICAN AMERICAN: 59 mL/min — AB (ref >90)
GLUCOSE: 110 mg/dL — AB (ref 70–99)
Potassium: 4 mEq/L (ref 3.7–5.3)
Sodium: 139 mEq/L (ref 137–147)
TOTAL PROTEIN: 6.2 g/dL (ref 6.0–8.3)
Total Bilirubin: 0.5 mg/dL (ref 0.3–1.2)

## 2013-09-05 LAB — PREALBUMIN: PREALBUMIN: 7.5 mg/dL — AB (ref 17.0–34.0)

## 2013-09-05 MED ORDER — ACETAMINOPHEN 160 MG/5ML PO SOLN: 650.0000 mg | ORAL | Status: DC | PRN

## 2013-09-05 MED ORDER — OXYCODONE HCL 20 MG/ML PO CONC: 5.0000 mg | ORAL | Status: DC | PRN

## 2013-09-05 MED ORDER — PANTOPRAZOLE SODIUM 40 MG PO TBEC
40.0000 mg | DELAYED_RELEASE_TABLET | Freq: Two times a day (BID) | ORAL | Status: DC
  Administered 2013-09-05 – 2013-09-07 (×5): 40 mg via ORAL
  Filled 2013-09-05 (×5): qty 1

## 2013-09-05 MED ORDER — FENTANYL CITRATE 0.05 MG/ML IJ SOLN: 12.5000 ug | INTRAMUSCULAR | Status: DC | PRN

## 2013-09-05 NOTE — Progress Notes (Signed)
PCA pump discontinued, 31mL of fentanyl wasted in stink with this nurse and  Quillian Quince, RN.

## 2013-09-05 NOTE — Progress Notes (Signed)
Patient looks good.  No leak.  Advance diet.  Kathryne Eriksson. [name], MD, [place] [phone] [facility] Surgery

## 2013-09-05 NOTE — Progress Notes (Signed)
8 Days Post-Op  Subjective: He looks good having some stool yesterday.  Tolerated clears well, and I will advance diet.  Remove foley, OOB more.  Objective: Vital signs in last 24 hours: Temp:  [97.8 F (36.6 C)-99.2 F (37.3 C)] 99.2 F (37.3 C) (04/08 0736) Pulse Rate:  [85-88] 87 (04/08 0741) Resp:  [13-26] 16 (04/08 0759) BP: (117-134)/(41-72) 127/67 mmHg (04/08 0736) SpO2:  [2 %-99 %] 96 % (04/08 0759) FiO2 (%):  [28 %] 28 % (04/08 0759) Weight:  [79.6 kg (175 lb 7.8 oz)] 79.6 kg (175 lb 7.8 oz) (04/08 0400) Last BM Date: 09/04/13 Po not recorded Intake/Output from previous day: 04/07 0701 - 04/08 0700 In: 2415.5 [I.V.:1650; IV Piggyback:262.5] Out: 900 [Urine:900] Intake/Output this shift: Total I/O In: 87.5 [I.V.:75; IV Piggyback:12.5] Out: 300 [Urine:300]  General appearance: alert, cooperative and no distress Resp: clear to auscultation bilaterally and BS down some in the bases. GI: soft sore, drainage from JP's unchanged. Open wound OK  Lab Results:   Recent Labs  09/04/13 0520 09/05/13 0335  WBC 15.5* 13.1*  HGB 10.9* 10.5*  HCT 31.6* 30.7*  PLT 339 354    BMET  Recent Labs  09/04/13 0520 09/05/13 0335  NA 138 139  K 4.1 4.0  CL 100 104  CO2 23 22  GLUCOSE 118* 110*  BUN 13 11  CREATININE 1.32 1.30  CALCIUM 8.2* 8.4   PT/INR No results found for this basename: LABPROT, INR,  in the last 72 hours   Recent Labs Lab 09/05/13 0335  AST 36  ALT 42  ALKPHOS 58  BILITOT 0.5  PROT 6.2  ALBUMIN 1.9*     Lipase     Component Value Date/Time   LIPASE 17 08/28/2013 0443     Studies/Results: Dg Ugi W/water Sol Cm  09/03/2013   CLINICAL DATA:  History of perforated duodenum ulcer. Evaluate for possible leak.  EXAM: WATER SOLUBLE UPPER GI SERIES  TECHNIQUE: Single-column upper GI series was performed using water soluble contrast.  CONTRAST:  139mL OMNIPAQUE IOHEXOL 300 MG/ML  SOLN  COMPARISON:  CT scan 09/02/2013  FLUOROSCOPY TIME:  1 min and  55 seconds  FINDINGS: Contrast was instilled through the existing gastrostomy tube. No leaking contrast was identified. There is a wide mouthed duodenum diverticulum along the anti mesenteric border. This is unusual but it does correlate with the CT findings. This is in the descending duodenum just below the duodenum bulb. The stomach is unremarkable. Moderate GE reflux was demonstrated. This may explain the thickened esophagus on the CT scan.  IMPRESSION: Duodenum diverticulum.  No leaking oral contrast.  GE reflux may account for the esophageal wall thickening seen on CT scan.   Electronically Signed   By: Kalman Jewels M.D.   On: 09/03/2013 15:46    Medications: . antiseptic oral rinse  15 mL Mouth Rinse BID  . budesonide-formoterol  2 puff Inhalation BID  . enoxaparin (LOVENOX) injection  40 mg Subcutaneous Q24H  . famotidine (PEPCID) IV  20 mg Intravenous Q12H  . fentaNYL   Intravenous 6 times per day  . fluconazole (DIFLUCAN) IV  100 mg Intravenous Q24H  . ipratropium-albuterol  3 mL Nebulization Q6H  . loratadine  10 mg Oral Daily  . piperacillin-tazobactam (ZOSYN)  IV  3.375 g Intravenous 3 times per day    Assessment/Plan 1. Perforated viscus, likely duodenal ulcer, ? Mass  S/p 1 exploratory laparotomy with Phillip Heal patch of perforated duodenal ulcer #224 [place] gastrostomy tube  placement #3 EGD, 08/28/2013, Rolm Bookbinder, MD  [facility] 09/03/13: Duodenum diverticulum. No leaking oral contrast. GE reflux may account for the esophageal wall thickening seen on CT scan.  2. COPD, secondary to tobacco abuse  3. OSA  4. H/o PE on coumadin  5. Therapeutic INR  6. H/o prostate cancer  7. Hyperlipidemia  8.  Plan 10 days of antibiotics at this time. 8/10 days.    Plan:  From our standpoint he could go to floor, but will defer to [place] with his COPD. i plan to stop the PCA, increase diet to full.  D/c foley.  He has PT and I will ask OT to see also. Check h pylori and change to PO  protonix  LOS: 8 days    Earnstine Regal 09/05/2013

## 2013-09-05 NOTE — Progress Notes (Signed)
PULMONARY / CRITICAL CARE MEDICINE .nx  Name: Jon White MRN: [id number] DOB: August 09, 1932    ADMISSION DATE:  08/28/2013 CONSULTATION DATE:  08/28/2013  REFERRING MD :  Donne Hazel  PRIMARY SERVICE: General Surgery  CHIEF COMPLAINT:  COPD/PE, assist with post-op management   BRIEF PATIENT DESCRIPTION:   80 yo former smoker, f/b MR for COPD Stage II/Class B and remote PE in 2013 (on coumadin), also [place] for OSA (CPAP 12 cm H2O). Admitted on 3/31 w/ perforated duodenal ulcer, repaired on 3/31. PCCM was asked to see pre/post-op given concern about his multiple pulmonary comorbids.  SIGNIFICANT EVENTS / STUDIES:  3/31 CT abd/pelvis findings suspicious for duodenal ulceration with perforation. Exploratory laparotomy surgery revealed perforated duodenal ulcer that was repaired with Phillip Heal patch with minimal  blood loss. Pt required intubation. G tube and drains placed. 4/1 Extubated successfully 4/2  Stable respiratory status, to transfer to SDU. Abdominal pain controlled on PCA fentanyl. Drain with high output bile.   LINES / TUBES: ET Tube 3/31>>4/1 G-tube 3/31 >>   CULTURES: MRSA PCR Neg 3/31  ANTIBIOTICS: Zosyn 3/31>>  Diflucan 3/31>>   SUBJECTIVE: denies pain and breathing issues, only needed to use PCA pump one time overnight.   VITAL SIGNS: Temp:  [97.8 F (36.6 C)-98.9 F (37.2 C)] 98.8 F (37.1 C) (04/08 0400) Pulse Rate:  [85-105] 88 (04/08 0400) Resp:  [13-26] 19 (04/08 0417) BP: (117-134)/(41-72) 117/66 mmHg (04/08 0400) SpO2:  [2 %-99 %] 2 % (04/08 0417) FiO2 (%):  [28 %] 28 % (04/07 1558) Weight:  [79.6 kg (175 lb 7.8 oz)] 79.6 kg (175 lb 7.8 oz) (04/08 0400) HEMODYNAMICS:   VENTILATOR SETTINGS: Vent Mode:  [-]  FiO2 (%):  [28 %] 28 % INTAKE / OUTPUT: Intake/Output     04/07 0701 - 04/08 0700   I.V. (mL/kg) 1575 (19.8)   Other 503   IV Piggyback 250   Total Intake(mL/kg) 2328 (29.2)   Urine (mL/kg/hr) 900 (0.5)   Total Output 900   Net +1428       Stool Occurrence 2 x     PHYSICAL EXAMINATION: General: overweight male, nad Neuro: alert, moves all 4.  HEENT: nose without purulence or d/c, neck without LN or TMG Cardiovascular: RRR, no murmur  Lungs:  a few basilar crackles, no wheezing. Abdomen: prominent, mild tenderness Ext: SCD's   ASSESSMENT / PLAN:  PULMONARY A: Acute Respiratory Failure s/p lap for perforated duodenal ulcer w/ repair and G tube placement  GOLD stage II/ class B COPD OSA Remote PE (2013)  - Pt is stable currently from a pulmonary standpoint.  Would focus on IS, and mobilization as allowed by surgery.   P:  - Oxygen PRN, SpO2 stable on RA, wearing [place] for PCA CO2 reading.  - Lovenox - will need to be started on coumadin or xeralto for PE but will defer when to start to surgery given recent surgery. - Pulmonary toilet with OOB, IS. - CPAP at night. - On duonebs so will not start tudorza, once ready for discharge would d/c duonebs and start tudorza (not on formulary in [facility]). - PRN albuterol added. - home dose zyrtec - Symbicort - Consider d/c PCA as only used once overnight  HEMATOLOGIC A: Supra therapeutic INR on coumadin s/p 6 FFU and vitamin K - Resolved Normocytic Anemia, likely dilutional   History of  PE (2013) History of Prostate Ca (>20 yrs) P:  - Anticoagulation plan as above. - SCD's and lovenox for DVT prophylaxis  until able to take full anti-coagulation. - Would like surgery to determine when to start coumadin for PE.  - SCD's.  Georgann Housekeeper, ACNP [place] Pulmonology/Critical Care Pager [phone] or [phone]  Bronchodilator therapy as above, ambulatory desat study needs to be ordered prior to discharge to determine if patient will need O2 at home, if sat on RA with ambulation is 88% or less then will need home O2.  F/U with PCCM as outpatient post discharge.  Upon discharge would d/c duonebs and start back on tudorza.  Will need chronic anticoag but will defer to surgery  on when is ok to start given extensive surgical procedure.  PCCM will sign off, please call back if needed.

## 2013-09-06 LAB — CBC
HCT: 29.1 % — ABNORMAL LOW (ref 39.0–52.0)
HEMOGLOBIN: 9.9 g/dL — AB (ref 13.0–17.0)
MCH: 30.6 pg (ref 26.0–34.0)
MCHC: 34 g/dL (ref 30.0–36.0)
MCV: 89.8 fL (ref 78.0–100.0)
PLATELETS: 424 10*3/uL — AB (ref 150–400)
RBC: 3.24 MIL/uL — AB (ref 4.22–5.81)
RDW: 15.4 % (ref 11.5–15.5)
WBC: 14 10*3/uL — AB (ref 4.0–10.5)

## 2013-09-06 LAB — BASIC METABOLIC PANEL
BUN: 9 mg/dL (ref 6–23)
CHLORIDE: 104 meq/L (ref 96–112)
CO2: 23 meq/L (ref 19–32)
Calcium: 8.7 mg/dL (ref 8.4–10.5)
Creatinine, Ser: 1.32 mg/dL (ref 0.50–1.35)
GFR calc Af Amer: 58 mL/min — ABNORMAL LOW (ref >90)
GFR calc non Af Amer: 50 mL/min — ABNORMAL LOW (ref >90)
GLUCOSE: 97 mg/dL (ref 70–99)
POTASSIUM: 4.1 meq/L (ref 3.7–5.3)
SODIUM: 142 meq/L (ref 137–147)

## 2013-09-06 LAB — H. PYLORI ANTIBODY, IGG: H PYLORI IGG: 0.63 {ISR}

## 2013-09-06 LAB — PROTIME-INR
INR: 1.44 (ref 0.00–1.49)
Prothrombin Time: 17.2 seconds — ABNORMAL HIGH (ref 11.6–15.2)

## 2013-09-06 LAB — CLOSTRIDIUM DIFFICILE BY PCR: CDIFFPCR: NEGATIVE

## 2013-09-06 MED ORDER — IPRATROPIUM-ALBUTEROL 0.5-2.5 (3) MG/3ML IN SOLN
3.0000 mL | Freq: Four times a day (QID) | RESPIRATORY_TRACT | Status: DC | PRN
  Administered 2013-09-07: 3 mL via RESPIRATORY_TRACT
  Filled 2013-09-06: qty 3

## 2013-09-06 NOTE — Progress Notes (Signed)
Pt has orders to move to floor [place]. Nurse called back from [place]. Gave report.

## 2013-09-06 NOTE — H&P (Signed)
Called to give report to 6 Horticulturist, commercial stated nurse would call 2c nurse back. Pt has orders to transfer to floor [place] per md orders.

## 2013-09-06 NOTE — Evaluation (Signed)
Occupational Therapy Evaluation Patient Details Name: Jon White MRN: [id number] DOB: April 15, 1933 Today's Date: 09/06/2013    History of Present Illness 80 yo former smoker, f/b MR for COPD Stage II/Class B and remote PE in 2013 (on coumadin), also [place] for OSA (CPAP 12 cm H2O). Admitted on 3/31 w/ perforated duodenal ulcer, repaired on 3/31   Clinical Impression   Pt admitted with above diagnosis and has the deficits listed below. Pt would benefit from cont OT to increase I with basic adls and be able to return home with his wife at mod I level of care.    Follow Up Recommendations  Home health OT;Supervision/Assistance - 24 hour    Equipment Recommendations  3 in 1 bedside comode    Recommendations for Other Services       Precautions / Restrictions Precautions Precautions: Fall Precaution Comments: drain, 2 jp drain Restrictions Weight Bearing Restrictions: No      Mobility Bed Mobility Overal bed mobility: Needs Assistance Bed Mobility: Rolling;Sidelying to Sit Rolling: Modified independent (Device/Increase time) Sidelying to sit: Min assist       General bed mobility comments: cues for sequence to ease transition and pain and assist to fully elevate trunk.   Transfers Overall transfer level: Needs assistance Equipment used: Rolling walker (2 wheeled) Transfers: Sit to/from [facility] Sit to Stand: Min guard Stand pivot transfers: Min assist       General transfer comment: cues for hand placement and safety     Balance Overall balance assessment: Needs assistance Sitting-balance support: Feet supported Sitting balance-Leahy Scale: Good     Standing balance support: Bilateral upper extremity supported;During functional activity Standing balance-Leahy Scale: Fair                              ADL Overall ADL's : Needs assistance/impaired Eating/Feeding: Set up;Sitting   Grooming: Wash/dry face;Wash/dry hands;Oral  care;Standing;Supervision/safety Grooming Details (indicate cue type and reason): S only when standing for safety and lines Upper Body Bathing: Set up;Sitting   Lower Body Bathing: Minimal assistance;Sit to/from stand Lower Body Bathing Details (indicate cue type and reason): painful to reach to lower legs and feet.  may need ae Upper Body Dressing : Minimal assistance;Sitting Upper Body Dressing Details (indicate cue type and reason): min assist for lines Lower Body Dressing: Moderate assistance;Sit to/from stand Lower Body Dressing Details (indicate cue type and reason): painful for pt to reach LEs due to abdominal drains and back pain. Discussed proper body mechanics and the possible use of AE. Toilet Transfer: Min guard;Ambulation Toilet Transfer Details (indicate cue type and reason): Pt walked to br and toileted with S bc of multiple lines Toileting- Clothing Manipulation and Hygiene: Min guard;Sit to/from stand Toileting - Clothing Manipulation Details (indicate cue type and reason): min guard to keep balance while managing clothes.     Functional mobility during ADLs: Min guard General ADL Comments: Pt does very well with all adls considering his medical history.  Pt has difficulty reach feet but  may be fine given time.  If not, pt could benefit from AE for LE dressing and bathing.     Vision                     Perception     Praxis      Pertinent Vitals/Pain Pt walked in and out of room without O2 for appx 25 min and kept O2 sats above  90.  Nursing aware and told therapist to leave O2 off at this time.       Hand Dominance Left   Extremity/Trunk Assessment Upper Extremity Assessment Upper Extremity Assessment: Overall WFL for tasks assessed   Lower Extremity Assessment Lower Extremity Assessment: Defer to PT evaluation   Cervical / Trunk Assessment Cervical / Trunk Assessment: Normal   Communication Communication Communication: No difficulties    Cognition Arousal/Alertness: Awake/alert Behavior During Therapy: WFL for tasks assessed/performed Overall Cognitive Status: Within Functional Limits for tasks assessed                     General Comments       Exercises       Shoulder Instructions      Home Living Family/patient expects to be discharged to:: Private residence Living Arrangements: Spouse/significant other Available Help at Discharge: Family;Available 24 hours/day Type of Home: House Home Access: Stairs to enter [employer] of Steps: 3 Entrance Stairs-Rails: Can reach both;Right;Left Home Layout: Two level;Laundry or work area in basement;Able to live on main level with bedroom/bathroom     Bathroom Shower/Tub: Gaffer;Door   Bathroom Toilet: Handicapped height     Home Equipment: None          Prior Functioning/Environment Level of Independence: Independent        Comments: pt was helping son remodel his home immediately prior to admission although he also reports chronic back issues    OT Diagnosis: Generalized weakness;Acute pain;Other (comment) (pain only when attempting to reach LEs for adls.)   OT Problem List: Decreased activity tolerance;Decreased knowledge of use of DME or AE;Pain;Impaired balance (sitting and/or standing)   OT Treatment/Interventions: Self-care/ADL training;DME and/or AE instruction;Therapeutic activities    OT Goals(Current goals can be found in the care plan section) Acute Rehab OT Goals Patient Stated Goal: be able to return to working on the yard and son's house OT Goal Formulation: With patient/family Time For Goal Achievement: 09/20/13 Potential to Achieve Goals: Good ADL Goals Pt Will Perform Grooming: with modified independence;standing Pt Will Perform Lower Body Bathing: with modified independence;with adaptive equipment;sit to/from stand Pt Will Perform Lower Body Dressing: with modified independence;with adaptive equipment;sit  to/from stand Pt Will Perform Tub/Shower Transfer: Shower transfer;with supervision;ambulating;rolling walker Additional ADL Goal #1: Pt will complete all toileting with 3:1 over commode and mod I  OT Frequency: Min 2X/week   Barriers to D/C:            Co-evaluation              End of Session Equipment Utilized During Treatment: Surveyor, mining Communication: Mobility status;Other (comment) (O2 status)  Activity Tolerance: Patient tolerated treatment well Patient left: in bed;with call bell/phone within reach;with family/visitor present   Time: 1132-1214 OT Time Calculation (min): 42 min Charges:  OT General Charges $OT Visit: 1 Procedure OT Evaluation $Initial OT Evaluation Tier I: 1 Procedure OT Treatments $Self Care/Home Management : 38-52 mins G-Codes:    Vickki Muff Oct 01, 2013, 12:24 PM [phone]

## 2013-09-06 NOTE — Progress Notes (Signed)
Pt is leaving with night nurse and nurse assistant to go to [place]. Pt in no distress on departure. Placed new bed pad under pt and placed a new gown on pt prior to pt leaving floor. Did peri care. Pt still has a rectal pouch in place.

## 2013-09-06 NOTE — Progress Notes (Signed)
CCS/Jon White Progress Note 9 Days Post-Op  Subjective: Patient looks great.  Taking liquids well.  Only complaint is diarrhea  Objective: Vital signs in last 24 hours: Temp:  [97.6 F (36.4 C)-97.9 F (36.6 C)] 97.8 F (36.6 C) (04/09 0749) Pulse Rate:  [82-97] 82 (04/09 0749) Resp:  [13-23] 14 (04/09 0749) BP: (108-143)/(60-80) 125/60 mmHg (04/09 0749) SpO2:  [90 %-100 %] 99 % (04/09 0749) FiO2 (%):  [28 %] 28 % (04/08 1127) Weight:  [79 kg (174 lb 2.6 oz)] 79 kg (174 lb 2.6 oz) (04/09 0410) Last BM Date: 09/05/13  Intake/Output from previous day: 04/08 0701 - 04/09 0700 In: 1609.5 [I.V.:1275; IV Piggyback:212.5] Out: 2725 [Urine:3290; Stool:2] Intake/Output this shift: Total I/O In: -  Out: 200 [Urine:200]  General: No acute distress  Lungs: Clear  Abd: Soft, good bowel sounds.  Minimal drainage from [facility] drains.  Extremities: No changes  Neuro: Intact  Lab Results:  @LABLAST2 (wbc:2,hgb:2,hct:2,plt:2) BMET  Recent Labs  09/05/13 0335 09/06/13 0300  NA 139 142  K 4.0 4.1  CL 104 104  CO2 22 23  GLUCOSE 110* 97  BUN 11 9  CREATININE 1.30 1.32  CALCIUM 8.4 8.7   PT/INR  Recent Labs  09/06/13 0300  LABPROT 17.2*  INR 1.44   ABG No results found for this basename: PHART, PCO2, PO2, HCO3,  in the last 72 hours  Studies/Results: No results found.  Anti-infectives: Anti-infectives   Start     Dose/Rate Route Frequency Ordered Stop   08/28/13 1830  piperacillin-tazobactam (ZOSYN) IVPB 3.375 g     3.375 g 12.5 mL/hr over 240 Minutes Intravenous 3 times per day 08/28/13 1616     08/28/13 1200  fluconazole (DIFLUCAN) IVPB 100 mg     100 mg 50 mL/hr over 60 Minutes Intravenous Every 24 hours 08/28/13 0927     08/28/13 1030  piperacillin-tazobactam (ZOSYN) IVPB 3.375 g     3.375 g 100 mL/hr over 30 Minutes Intravenous  Once 08/28/13 1025 08/28/13 1122   08/28/13 1000  piperacillin-tazobactam (ZOSYN) IVPB 3.375 g  Status:  Discontinued     3.375  g 12.5 mL/hr over 240 Minutes Intravenous 3 times per day 08/28/13 0927 08/28/13 1026      Assessment/Plan: s/p Procedure(s): EXPLORATORY LAPAROTOMY ESOPHAGOGASTRODUODENOSCOPY (EGD) DUODENAL ULCER PATCH GASTROSTOMY Advance diet Detach clamped G-tube. Transfer to the floor. Check C.diff if not already done.  LOS: 9 days   Kathryne Eriksson. [name], MD, FACS [phone] [facility] Surgery 09/06/2013

## 2013-09-07 MED ORDER — PANTOPRAZOLE SODIUM 40 MG PO TBEC: 40.0000 mg | DELAYED_RELEASE_TABLET | Freq: Two times a day (BID) | ORAL | Status: DC

## 2013-09-07 MED ORDER — AMOXICILLIN-POT CLAVULANATE 875-125 MG PO TABS: 1.0000 | ORAL_TABLET | Freq: Two times a day (BID) | ORAL | Status: DC

## 2013-09-07 MED ORDER — OXYCODONE-ACETAMINOPHEN 5-325 MG PO TABS: 1.0000 | ORAL_TABLET | ORAL | Status: DC | PRN

## 2013-09-07 NOTE — Discharge Planning (Signed)
Patient discharged home in stable condition. Verbalizes understanding of all discharge instructions, including home medications and follow up appointments. 

## 2013-09-07 NOTE — Progress Notes (Signed)
Physical Therapy Treatment Patient Details Name: Jon White MRN: [id number] DOB: 08/24/1932 Today's Date: 09/07/2013    History of Present Illness 80 yo former smoker, f/b MR for COPD Stage II/Class B and remote PE in 2013 (on coumadin), also [place] for OSA (CPAP 12 cm H2O). Admitted on 3/31 w/ perforated duodenal ulcer, repaired on 3/31    PT Comments    Patient tolerated treatment well today. Patient able to do standing marches in RW without LOB. CSW in room to discuss equipment at d/c. Patient states leaving this afternoon. Recommend HHPT to help encourage functional independence. Patient states wide and daughter can A with ADLs and safety while at home.    Follow Up Recommendations  Home health PT;Supervision/Assistance - 24 hour     Equipment Recommendations  Rolling walker with 5" wheels    Recommendations for Other Services       Precautions / Restrictions Precautions Precautions: Fall Restrictions Weight Bearing Restrictions: No    Mobility  Bed Mobility               General bed mobility comments: Patient was in recliner upon arrival  Transfers Overall transfer level: Needs assistance Equipment used: Rolling walker (2 wheeled) Transfers: Sit to/from Stand Sit to Stand: Min guard         General transfer comment: cues for hand placement and safety   Ambulation/Gait Ambulation/Gait assistance: Min guard Ambulation Distance (Feet): 200 Feet Assistive device: Rolling walker (2 wheeled) Gait Pattern/deviations: Step-through pattern;Decreased stride length     General Gait Details: 2 standing rests with gait training   Stairs            Wheelchair Mobility    Modified Rankin (Stroke Patients Only)       Balance Overall balance assessment: Needs assistance Sitting-balance support: Feet supported Sitting balance-Leahy Scale: Good     Standing balance support: Bilateral upper extremity supported Standing balance-Leahy Scale: Fair                       Cognition Arousal/Alertness: Awake/alert Behavior During Therapy: WFL for tasks assessed/performed Overall Cognitive Status: Within Functional Limits for tasks assessed                      Exercises Total Joint Exercises Marching in Standing: AROM;Standing;Both;10 reps    General Comments        Pertinent Vitals/Pain Patient denies pain at this time.     Home Living                      Prior Function            PT Goals (current goals can now be found in the care plan section) Progress towards PT goals: Progressing toward goals    Frequency  Min 3X/week    PT Plan Current plan remains appropriate    Co-evaluation             End of Session Equipment Utilized During Treatment: Gait belt Activity Tolerance: Patient tolerated treatment well Patient left: in chair;with call bell/phone within reach     Time: 1134-1200 PT Time Calculation (min): 26 min  Charges:                       G Codes:      [place], SPTA 09/07/2013, 12:15 PM

## 2013-09-07 NOTE — Progress Notes (Signed)
Seen and agreed 09/07/2013 Gladyes Kudo Elizabeth Rocket Gunderson PTA [phone] pager [phone] office    

## 2013-09-07 NOTE — Discharge Summary (Signed)
Patient ID: Jon White MRN: [id number] DOB/AGE: [birth year] 80 y.o.  Admit date: 08/28/2013 Discharge date: 09/07/2013  Procedures:  #1 exploratory laparotomy with Phillip Heal patch of perforated duodenal ulcer  #224 [place] gastrostomy tube placement  #3 EGD Dr. Donne Hazel 08-28-13  Consults: pulmonary/intensive care  Reason for Admission: This is a very pleasant 80 yo white male who has a history of COPD due to tobacco abuse (quit 2 yrs ago), OSA (uses CPAP), h/o PE on coumadin who developed acute upper abdominal pain last night around midnight. He admits to some nausea, but no emesis. He denies fevers or chills. He denies heartburn or reflux. He had a normal BM yesterday with no blood. He had a normal colonoscopy 10ish years ago. He states he took ibuprofen for the first time in his life 2 days ago for some pain in his hand and had an allergic reaction. It actually brought him to the [facility] for SOB. He had a CTA at that time to rule out a PE. He denies any other use of NSAIDs. Due to continued severe pain, he came to the [facility] this morning. He was noted to have a WBC of 14.8K and an INR of 2.24. He had a CT of his abd/pel that revealed some esophageal thickening and free air likely c/w duodenal ulcer perforation. We have been called to evaluate the patient for surgical intervention.  Admission Diagnoses:  1. Perforated viscus, likely duodenal ulcer, ? [place]  2. COPD, secondary to tobacco abuse  3. OSA  4. H/o PE on coumadin  5. Therapeutic INR  6. H/o prostate cancer  7. Hyperlipidemia   Hospital Course: The patient was admitted and placed on IV zosyn and diflucan for perforated duodenal ulcer.  Pulmonary was consulted for his COPD and need for post op respiratory care.  He was emergently taken to the OR where he underwent the above procedure.  He was taken to the ICU intubated.  He was able to be extubated on POD 1.  Dressing changes were started to his open abdominal wound on this day as  well.  He had a g-tube that was placed to gravity and 2 JP drains.  On POD 3, it was felt the patient may have some bilious output in JP #2.  His g-tube was left to gravity due to this finding.  On POD 6, he no longer had bilious output.  An UGI was ordered which revealed no leak.  His g-tube was then clamped.  He tolerated this well and was then given clear liquids.  His diet was then able to be advanced as tolerated.  He was transferred from SDU to the floor.  The patient was stable from a respiratory standpoint during his hospitalization.  He did have some deconditioning.  He will be set up with [facility] PT,OT, RN prior to dc home.  On POD 10, the patient was dc home. Both drains were putting out less than 5 cc of non-bilious output.  They were both pulled prior to going home.  His g-tube will stay clamped.    PE: Abd: soft, appropriately tender, +BS, ND, wound is clean and packed.  JP 2 with cloudy output, less than 5cc, JP 1 with scant serous output, G-tube in place but clamped.    Discharge Diagnoses:  Principal Problem:   Perforated viscus Active Problems:   Acute respiratory failure   Altered mental status s/p ex lap with repair of perforated duodenal ulcer Patient Active Problem List   Diagnosis  Date Noted  . Perforated viscus 08/28/2013  . Acute respiratory failure 08/28/2013  . Altered mental status 08/28/2013  . Encounter for therapeutic drug monitoring 07/02/2013  . Chronic rhinosinusitis 03/21/2013  . COPD (chronic obstructive pulmonary disease) 06/07/2012  . Chronic pulmonary embolism 05/19/2012  . PE (pulmonary thromboembolism) 05/18/2012  . ACUTE SINUSITIS, UNSPECIFIED 08/12/2008  . STRICTURE AND STENOSIS OF ESOPHAGUS 07/31/2008  . G E R D 05/13/2008  . OSA (obstructive sleep apnea) 05/13/2008  . ENLARGEMENT OF LYMPH NODES 05/13/2008  . SHORTNESS OF BREATH (SOB) 05/13/2008  . COUGH 05/13/2008     Discharge Medications:   Medication List    STOP taking these medications        famotidine 20 MG tablet  Commonly known as:  PEPCID      TAKE these medications       atorvastatin 10 MG tablet  Commonly known as:  LIPITOR  Take 10 mg by mouth daily.     budesonide-formoterol 160-4.5 MCG/ACT inhaler  Commonly known as:  SYMBICORT  Inhale 2 puffs into the lungs 2 (two) times daily.     CALTRATE 600+D 600-400 MG-UNIT per tablet  Generic drug:  Calcium Carbonate-Vitamin D  Take 1 tablet by mouth 2 (two) times daily. With meals.     cetirizine 10 MG tablet  Commonly known as:  ZYRTEC  Take 10 mg by mouth daily as needed for allergies.     fluocinonide cream 0.05 %  Commonly known as:  LIDEX  Apply 1 application topically daily. To infected area.     fluticasone 50 MCG/ACT nasal spray  Commonly known as:  FLONASE  Place 2 sprays into the nose daily. Each nostril once a day.     multivitamin with minerals Tabs tablet  Take 1 tablet by mouth daily.     oxyCODONE-acetaminophen 5-325 MG per tablet  Commonly known as:  ROXICET  Take 1-2 tablets by mouth every 4 (four) hours as needed for severe pain.     pantoprazole 40 MG tablet  Commonly known as:  PROTONIX  Take 1 tablet (40 mg total) by mouth 2 (two) times daily.     TUDORZA PRESSAIR 400 MCG/ACT Aepb  Generic drug:  Aclidinium Bromide  Inhale 1 puff into the lungs 2 (two) times daily.     VENTOLIN HFA 108 (90 BASE) MCG/ACT inhaler  Generic drug:  albuterol  Inhale 2 puffs into the lungs every 6 (six) hours as needed for wheezing or shortness of breath.     warfarin 4 MG tablet  Commonly known as:  COUMADIN  Take 4 mg by mouth every evening.        Discharge Instructions: Follow-up Information   Follow up with Rolm Bookbinder, MD On 09/26/2013. (arrive at 4:30pm for a 4:45pm appointment)    Specialty:  General Surgery   Contact information:   [place] [address] [phone]       Follow up with [facility], MD In 2 weeks. (for hospital follow  up (1-2 weeks))    Specialty:  Internal Medicine   Contact information:   [place] [address] [phone]       Follow up with [facility], MD. (as you normally do)    Specialty:  Pulmonary Disease   Contact information:   [place] [address] [phone]       Signed: Henreitta Cea 09/07/2013, 9:45 AM

## 2013-09-07 NOTE — Progress Notes (Addendum)
Spoke with pt about HH needs and DME needs. He was uncertain about DME needs and asked me to call his wife to discuss and make arrangements. He states that he has respiratory equipment at home already and wished to use that company but couldn't remember the name of that company.   Spoke with Mrs. Tapanes who said that [employer] is who has provided pt's CPaP machine and that she would like to have them provide the rolling walker, 3-in-1 BSC and shower stool.  I advised her that the [facility] and shower stool would not be covered by Medicare and would cost $112 and $50 respectively.  She said that she still wanted to have both pieces because she feels pt needs them. Her son, Wynston Romey, will be at the house for the delivery of DME today and asks that the company call him at [phone]. I have spoke with Ander Purpura at [employer] and faxed her the documents required for DME.  Wife given choice of [place] agencies locally for PT/OT/RN/Aide.  She chose [employer] for the agency.  She also asked for a list of private duty agencies in case she needs to hire help to sit with patient when she has to do errands.  That is printed and placed with patient's chart for discharge today.   1113am--[employer] called back and said that they "lost the bid for [facility]" and can't provide the walker for this patient.  They will still provide the 3-in-1 and shower stool since those are not covered items from Medicare. I have contacted [place] to deliver the rolling walker to pt's room.

## 2013-09-07 NOTE — Discharge Summary (Signed)
I have seen and examined the pt and agree with PA-Osborne's progress note. Home today

## 2013-09-07 NOTE — Discharge Instructions (Signed)
[facility]      [facility] °[phone] ° °OPEN ABDOMINAL SURGERY: POST OP INSTRUCTIONS ° °Always review your discharge instruction sheet given to you by the facility where your surgery was performed. ° °IF YOU HAVE DISABILITY OR FAMILY LEAVE FORMS, YOU MUST BRING THEM TO THE OFFICE FOR PROCESSING.  PLEASE DO NOT GIVE THEM TO YOUR DOCTOR. ° °1. A prescription for pain medication may be given to you upon discharge.  Take your pain medication as prescribed, if needed.  If narcotic pain medicine is not needed, then you may take acetaminophen (Tylenol) or ibuprofen (Advil) as needed. °2. Take your usually prescribed medications unless otherwise directed. °3. If you need a refill on your pain medication, please contact your pharmacy. They will contact our office to request authorization.  Prescriptions will not be filled after 5pm or on week-ends. °4. You should follow a light diet the first few days after arrival home, such as soup and crackers, pudding, etc.unless your doctor has advised otherwise. A high-fiber, low fat diet can be resumed as tolerated.   Be sure to include lots of fluids daily. Most patients will experience some swelling and bruising on the chest and neck area.  Ice packs will help.  Swelling and bruising can take several days to resolve °5. Most patients will experience some swelling and bruising in the area of the incision. Ice pack will help. Swelling and bruising can take several days to resolve..  °6. It is common to experience some constipation if taking pain medication after surgery.  Increasing fluid intake and taking a stool softener will usually help or prevent this problem from occurring.  A mild laxative (Milk of Magnesia or Miralax) should be taken according to package directions if there are no bowel movements after 48 hours. °7.  You may have steri-strips (small skin tapes) in place directly over the incision.  These strips should be left on the skin for 7-10 days.  If your  surgeon used skin glue on the incision, you may shower in 24 hours.  The glue will flake off over the next 2-3 weeks.  Any sutures or staples will be removed at the office during your follow-up visit. You may find that a light gauze bandage over your incision may keep your staples from being rubbed or pulled. You may shower and replace the bandage daily. °8. ACTIVITIES:  You may resume regular (light) daily activities beginning the next day--such as daily self-care, walking, climbing stairs--gradually increasing activities as tolerated.  You may have sexual intercourse when it is comfortable.  Refrain from any heavy lifting or straining until approved by your doctor. °a. You may drive when you no longer are taking prescription pain medication, you can comfortably wear a seatbelt, and you can safely maneuver your car and apply brakes °b. Return to Work: ___________________________________ °9. You should see your doctor in the office for a follow-up appointment approximately two weeks after your surgery.  Make sure that you call for this appointment within a day or two after you arrive home to insure a convenient appointment time. °OTHER INSTRUCTIONS:  °_____________________________________________________________ °_____________________________________________________________ ° °WHEN TO CALL YOUR DOCTOR: °1. Fever over 101.0 °2. Inability to urinate °3. Nausea and/or vomiting °4. Extreme swelling or bruising °5. Continued bleeding from incision. °6. Increased pain, redness, or drainage from the incision. °7. Difficulty swallowing or breathing °8. Muscle cramping or spasms. °9. Numbness or tingling in hands or feet or around lips. ° °The clinic staff is available to   answer your questions during regular business hours.  Please dont hesitate to call and ask to speak to one of the nurses if you have concerns.  For further questions, please visit [email or web address]  Leave G-tube clamped off.  Dressing  Change A dressing is a material placed over wounds. It keeps the wound clean, dry, and protected from further injury. This provides an environment that favors wound healing.  BEFORE YOU BEGIN  Get your supplies together. Things you may need include:  Saline solution.  Flexible gauze dressing.  Medicated cream.  Tape.  Gloves.  Abdominal dressing pads.  Gauze squares.  Plastic bags.  Take pain medicine 30 minutes before the dressing change if you need it.  Take a shower before you do the first dressing change of the day. Use plastic wrap or a plastic bag to prevent the dressing from getting wet. REMOVING YOUR OLD DRESSING   Wash your hands with soap and water. Dry your hands with a clean towel.  Put on your gloves.  Remove any tape.  Carefully remove the old dressing. If the dressing sticks, you may dampen it with warm water to loosen it, or follow your caregiver's specific directions.  Remove any gauze or packing tape that is in your wound.  Take off your gloves.  Put the gloves, tape, gauze, or any packing tape into a plastic bag. CHANGING YOUR DRESSING  Open the supplies.  Take the cap off the saline solution.  Open the gauze package so that the gauze remains on the inside of the package.  Put on your gloves.  Clean your wound as told by your caregiver.  If you have been told to keep your wound dry, follow those instructions.  Your caregiver may tell you to do one or more of the following:  Pick up the gauze. Pour the saline solution over the gauze. Squeeze out the extra saline solution.  Put medicated cream or other medicine on your wound if you have been told to do so.  Put the solution soaked gauze only in your wound, not on the skin around it.  Pack your wound loosely or as told by your caregiver.  Put dry gauze on your wound.  Put abdominal dressing pads over the dry gauze if your wet gauze soaks through.  Tape the abdominal dressing pads in  place so they will not fall off. Do not wrap the tape completely around the affected part (arm, leg, abdomen).  Wrap the dressing pads with a flexible gauze dressing to secure it in place.  Take off your gloves. Put them in the plastic bag with the old dressing. Tie the bag shut and throw it away.  Keep the dressing clean and dry until your next dressing change.  Wash your hands. SEEK MEDICAL CARE IF:  Your skin around the wound looks red.  Your wound feels more tender or sore.  You see pus in the wound.  Your wound smells bad.  You have a fever.  Your skin around the wound has a rash that itches and burns.  You see black or yellow skin in your wound that was not there before.  You feel nauseous, throw up, and feel very tired. Document Released: 06/24/2004 Document Revised: 08/09/2011 Document Reviewed: 03/29/2011 [facility] Patient Information 2014 [place], [place].

## 2013-09-08 DIAGNOSIS — G4730 Sleep apnea, unspecified: Secondary | ICD-10-CM | POA: Diagnosis not present

## 2013-09-08 DIAGNOSIS — Z8546 Personal history of malignant neoplasm of prostate: Secondary | ICD-10-CM | POA: Diagnosis not present

## 2013-09-08 DIAGNOSIS — Z7901 Long term (current) use of anticoagulants: Secondary | ICD-10-CM | POA: Diagnosis not present

## 2013-09-08 DIAGNOSIS — Z431 Encounter for attention to gastrostomy: Secondary | ICD-10-CM | POA: Diagnosis not present

## 2013-09-08 DIAGNOSIS — Z48815 Encounter for surgical aftercare following surgery on the digestive system: Secondary | ICD-10-CM | POA: Diagnosis not present

## 2013-09-08 DIAGNOSIS — Z86711 Personal history of pulmonary embolism: Secondary | ICD-10-CM | POA: Diagnosis not present

## 2013-09-08 DIAGNOSIS — J189 Pneumonia, unspecified organism: Secondary | ICD-10-CM | POA: Diagnosis not present

## 2013-09-09 DIAGNOSIS — J189 Pneumonia, unspecified organism: Secondary | ICD-10-CM | POA: Diagnosis not present

## 2013-09-09 DIAGNOSIS — G4730 Sleep apnea, unspecified: Secondary | ICD-10-CM | POA: Diagnosis not present

## 2013-09-09 DIAGNOSIS — Z431 Encounter for attention to gastrostomy: Secondary | ICD-10-CM | POA: Diagnosis not present

## 2013-09-09 DIAGNOSIS — Z48815 Encounter for surgical aftercare following surgery on the digestive system: Secondary | ICD-10-CM | POA: Diagnosis not present

## 2013-09-09 DIAGNOSIS — Z86711 Personal history of pulmonary embolism: Secondary | ICD-10-CM | POA: Diagnosis not present

## 2013-09-09 DIAGNOSIS — Z8546 Personal history of malignant neoplasm of prostate: Secondary | ICD-10-CM | POA: Diagnosis not present

## 2013-09-10 DIAGNOSIS — J189 Pneumonia, unspecified organism: Secondary | ICD-10-CM | POA: Diagnosis not present

## 2013-09-10 DIAGNOSIS — Z8546 Personal history of malignant neoplasm of prostate: Secondary | ICD-10-CM | POA: Diagnosis not present

## 2013-09-10 DIAGNOSIS — Z48815 Encounter for surgical aftercare following surgery on the digestive system: Secondary | ICD-10-CM | POA: Diagnosis not present

## 2013-09-10 DIAGNOSIS — G4730 Sleep apnea, unspecified: Secondary | ICD-10-CM | POA: Diagnosis not present

## 2013-09-10 DIAGNOSIS — Z86711 Personal history of pulmonary embolism: Secondary | ICD-10-CM | POA: Diagnosis not present

## 2013-09-10 DIAGNOSIS — Z431 Encounter for attention to gastrostomy: Secondary | ICD-10-CM | POA: Diagnosis not present

## 2013-09-11 DIAGNOSIS — Z48815 Encounter for surgical aftercare following surgery on the digestive system: Secondary | ICD-10-CM | POA: Diagnosis not present

## 2013-09-11 DIAGNOSIS — J189 Pneumonia, unspecified organism: Secondary | ICD-10-CM | POA: Diagnosis not present

## 2013-09-11 DIAGNOSIS — G4730 Sleep apnea, unspecified: Secondary | ICD-10-CM | POA: Diagnosis not present

## 2013-09-11 DIAGNOSIS — Z86711 Personal history of pulmonary embolism: Secondary | ICD-10-CM | POA: Diagnosis not present

## 2013-09-11 DIAGNOSIS — Z8546 Personal history of malignant neoplasm of prostate: Secondary | ICD-10-CM | POA: Diagnosis not present

## 2013-09-11 DIAGNOSIS — Z431 Encounter for attention to gastrostomy: Secondary | ICD-10-CM | POA: Diagnosis not present

## 2013-09-12 DIAGNOSIS — Z431 Encounter for attention to gastrostomy: Secondary | ICD-10-CM | POA: Diagnosis not present

## 2013-09-12 DIAGNOSIS — J189 Pneumonia, unspecified organism: Secondary | ICD-10-CM | POA: Diagnosis not present

## 2013-09-12 DIAGNOSIS — Z8546 Personal history of malignant neoplasm of prostate: Secondary | ICD-10-CM | POA: Diagnosis not present

## 2013-09-12 DIAGNOSIS — Z86711 Personal history of pulmonary embolism: Secondary | ICD-10-CM | POA: Diagnosis not present

## 2013-09-12 DIAGNOSIS — G4730 Sleep apnea, unspecified: Secondary | ICD-10-CM | POA: Diagnosis not present

## 2013-09-12 DIAGNOSIS — Z48815 Encounter for surgical aftercare following surgery on the digestive system: Secondary | ICD-10-CM | POA: Diagnosis not present

## 2013-09-13 DIAGNOSIS — G4730 Sleep apnea, unspecified: Secondary | ICD-10-CM | POA: Diagnosis not present

## 2013-09-13 DIAGNOSIS — Z431 Encounter for attention to gastrostomy: Secondary | ICD-10-CM | POA: Diagnosis not present

## 2013-09-13 DIAGNOSIS — Z8546 Personal history of malignant neoplasm of prostate: Secondary | ICD-10-CM | POA: Diagnosis not present

## 2013-09-13 DIAGNOSIS — Z48815 Encounter for surgical aftercare following surgery on the digestive system: Secondary | ICD-10-CM | POA: Diagnosis not present

## 2013-09-13 DIAGNOSIS — J189 Pneumonia, unspecified organism: Secondary | ICD-10-CM | POA: Diagnosis not present

## 2013-09-13 DIAGNOSIS — Z86711 Personal history of pulmonary embolism: Secondary | ICD-10-CM | POA: Diagnosis not present

## 2013-09-14 DIAGNOSIS — Z431 Encounter for attention to gastrostomy: Secondary | ICD-10-CM | POA: Diagnosis not present

## 2013-09-14 DIAGNOSIS — Z86711 Personal history of pulmonary embolism: Secondary | ICD-10-CM | POA: Diagnosis not present

## 2013-09-14 DIAGNOSIS — J189 Pneumonia, unspecified organism: Secondary | ICD-10-CM | POA: Diagnosis not present

## 2013-09-14 DIAGNOSIS — Z48815 Encounter for surgical aftercare following surgery on the digestive system: Secondary | ICD-10-CM | POA: Diagnosis not present

## 2013-09-14 DIAGNOSIS — G4730 Sleep apnea, unspecified: Secondary | ICD-10-CM | POA: Diagnosis not present

## 2013-09-14 DIAGNOSIS — Z8546 Personal history of malignant neoplasm of prostate: Secondary | ICD-10-CM | POA: Diagnosis not present

## 2013-09-17 DIAGNOSIS — J189 Pneumonia, unspecified organism: Secondary | ICD-10-CM | POA: Diagnosis not present

## 2013-09-17 DIAGNOSIS — G4730 Sleep apnea, unspecified: Secondary | ICD-10-CM | POA: Diagnosis not present

## 2013-09-17 DIAGNOSIS — Z86711 Personal history of pulmonary embolism: Secondary | ICD-10-CM | POA: Diagnosis not present

## 2013-09-17 DIAGNOSIS — Z8546 Personal history of malignant neoplasm of prostate: Secondary | ICD-10-CM | POA: Diagnosis not present

## 2013-09-17 DIAGNOSIS — Z431 Encounter for attention to gastrostomy: Secondary | ICD-10-CM | POA: Diagnosis not present

## 2013-09-17 DIAGNOSIS — Z48815 Encounter for surgical aftercare following surgery on the digestive system: Secondary | ICD-10-CM | POA: Diagnosis not present

## 2013-09-18 DIAGNOSIS — G4730 Sleep apnea, unspecified: Secondary | ICD-10-CM | POA: Diagnosis not present

## 2013-09-18 DIAGNOSIS — Z431 Encounter for attention to gastrostomy: Secondary | ICD-10-CM | POA: Diagnosis not present

## 2013-09-18 DIAGNOSIS — Z48815 Encounter for surgical aftercare following surgery on the digestive system: Secondary | ICD-10-CM | POA: Diagnosis not present

## 2013-09-18 DIAGNOSIS — Z86711 Personal history of pulmonary embolism: Secondary | ICD-10-CM | POA: Diagnosis not present

## 2013-09-18 DIAGNOSIS — J189 Pneumonia, unspecified organism: Secondary | ICD-10-CM | POA: Diagnosis not present

## 2013-09-18 DIAGNOSIS — Z8546 Personal history of malignant neoplasm of prostate: Secondary | ICD-10-CM | POA: Diagnosis not present

## 2013-09-19 DIAGNOSIS — G4730 Sleep apnea, unspecified: Secondary | ICD-10-CM | POA: Diagnosis not present

## 2013-09-19 DIAGNOSIS — Z86711 Personal history of pulmonary embolism: Secondary | ICD-10-CM | POA: Diagnosis not present

## 2013-09-19 DIAGNOSIS — Z48815 Encounter for surgical aftercare following surgery on the digestive system: Secondary | ICD-10-CM | POA: Diagnosis not present

## 2013-09-19 DIAGNOSIS — Z431 Encounter for attention to gastrostomy: Secondary | ICD-10-CM | POA: Diagnosis not present

## 2013-09-19 DIAGNOSIS — J189 Pneumonia, unspecified organism: Secondary | ICD-10-CM | POA: Diagnosis not present

## 2013-09-19 DIAGNOSIS — Z8546 Personal history of malignant neoplasm of prostate: Secondary | ICD-10-CM | POA: Diagnosis not present

## 2013-09-20 ENCOUNTER — Ambulatory Visit (INDEPENDENT_AMBULATORY_CARE_PROVIDER_SITE_OTHER): Payer: Medicare Other | Admitting: General Practice

## 2013-09-20 DIAGNOSIS — R109 Unspecified abdominal pain: Secondary | ICD-10-CM | POA: Diagnosis not present

## 2013-09-20 DIAGNOSIS — Z48815 Encounter for surgical aftercare following surgery on the digestive system: Secondary | ICD-10-CM | POA: Diagnosis not present

## 2013-09-20 DIAGNOSIS — Z7901 Long term (current) use of anticoagulants: Secondary | ICD-10-CM

## 2013-09-20 DIAGNOSIS — K265 Chronic or unspecified duodenal ulcer with perforation: Secondary | ICD-10-CM | POA: Diagnosis not present

## 2013-09-20 DIAGNOSIS — G4733 Obstructive sleep apnea (adult) (pediatric): Secondary | ICD-10-CM | POA: Diagnosis not present

## 2013-09-20 DIAGNOSIS — I2699 Other pulmonary embolism without acute cor pulmonale: Secondary | ICD-10-CM

## 2013-09-20 DIAGNOSIS — Z5181 Encounter for therapeutic drug level monitoring: Secondary | ICD-10-CM

## 2013-09-20 DIAGNOSIS — J449 Chronic obstructive pulmonary disease, unspecified: Secondary | ICD-10-CM | POA: Diagnosis not present

## 2013-09-20 DIAGNOSIS — G4730 Sleep apnea, unspecified: Secondary | ICD-10-CM | POA: Diagnosis not present

## 2013-09-20 DIAGNOSIS — Z8546 Personal history of malignant neoplasm of prostate: Secondary | ICD-10-CM | POA: Diagnosis not present

## 2013-09-20 DIAGNOSIS — J189 Pneumonia, unspecified organism: Secondary | ICD-10-CM | POA: Diagnosis not present

## 2013-09-20 DIAGNOSIS — Z431 Encounter for attention to gastrostomy: Secondary | ICD-10-CM | POA: Diagnosis not present

## 2013-09-20 DIAGNOSIS — Z86711 Personal history of pulmonary embolism: Secondary | ICD-10-CM | POA: Diagnosis not present

## 2013-09-20 LAB — POCT INR: INR: 4.8

## 2013-09-20 NOTE — Progress Notes (Signed)
Pre visit review using our clinic review tool, if applicable. No additional management support is needed unless otherwise documented below in the visit note. 

## 2013-09-21 DIAGNOSIS — Z48815 Encounter for surgical aftercare following surgery on the digestive system: Secondary | ICD-10-CM | POA: Diagnosis not present

## 2013-09-21 DIAGNOSIS — Z8546 Personal history of malignant neoplasm of prostate: Secondary | ICD-10-CM | POA: Diagnosis not present

## 2013-09-21 DIAGNOSIS — J189 Pneumonia, unspecified organism: Secondary | ICD-10-CM | POA: Diagnosis not present

## 2013-09-21 DIAGNOSIS — Z86711 Personal history of pulmonary embolism: Secondary | ICD-10-CM | POA: Diagnosis not present

## 2013-09-21 DIAGNOSIS — Z431 Encounter for attention to gastrostomy: Secondary | ICD-10-CM | POA: Diagnosis not present

## 2013-09-21 DIAGNOSIS — G4730 Sleep apnea, unspecified: Secondary | ICD-10-CM | POA: Diagnosis not present

## 2013-09-24 DIAGNOSIS — Z48815 Encounter for surgical aftercare following surgery on the digestive system: Secondary | ICD-10-CM | POA: Diagnosis not present

## 2013-09-24 DIAGNOSIS — Z86711 Personal history of pulmonary embolism: Secondary | ICD-10-CM | POA: Diagnosis not present

## 2013-09-24 DIAGNOSIS — G4730 Sleep apnea, unspecified: Secondary | ICD-10-CM | POA: Diagnosis not present

## 2013-09-24 DIAGNOSIS — Z431 Encounter for attention to gastrostomy: Secondary | ICD-10-CM | POA: Diagnosis not present

## 2013-09-24 DIAGNOSIS — Z8546 Personal history of malignant neoplasm of prostate: Secondary | ICD-10-CM | POA: Diagnosis not present

## 2013-09-24 DIAGNOSIS — J189 Pneumonia, unspecified organism: Secondary | ICD-10-CM | POA: Diagnosis not present

## 2013-09-24 LAB — POCT INR: INR: 3.7

## 2013-09-25 ENCOUNTER — Ambulatory Visit: Payer: Self-pay | Admitting: Family

## 2013-09-25 ENCOUNTER — Ambulatory Visit (INDEPENDENT_AMBULATORY_CARE_PROVIDER_SITE_OTHER): Payer: Medicare Other | Admitting: General Practice

## 2013-09-25 DIAGNOSIS — Z431 Encounter for attention to gastrostomy: Secondary | ICD-10-CM | POA: Diagnosis not present

## 2013-09-25 DIAGNOSIS — Z5181 Encounter for therapeutic drug level monitoring: Secondary | ICD-10-CM

## 2013-09-25 DIAGNOSIS — J189 Pneumonia, unspecified organism: Secondary | ICD-10-CM | POA: Diagnosis not present

## 2013-09-25 DIAGNOSIS — Z48815 Encounter for surgical aftercare following surgery on the digestive system: Secondary | ICD-10-CM | POA: Diagnosis not present

## 2013-09-25 DIAGNOSIS — I2699 Other pulmonary embolism without acute cor pulmonale: Secondary | ICD-10-CM

## 2013-09-25 DIAGNOSIS — Z86711 Personal history of pulmonary embolism: Secondary | ICD-10-CM | POA: Diagnosis not present

## 2013-09-25 DIAGNOSIS — Z8546 Personal history of malignant neoplasm of prostate: Secondary | ICD-10-CM | POA: Diagnosis not present

## 2013-09-25 DIAGNOSIS — G4730 Sleep apnea, unspecified: Secondary | ICD-10-CM | POA: Diagnosis not present

## 2013-09-25 NOTE — Patient Instructions (Signed)
Done by Dario Guardian

## 2013-09-25 NOTE — Progress Notes (Signed)
Pre visit review using our clinic review tool, if applicable. No additional management support is needed unless otherwise documented below in the visit note. 

## 2013-09-26 DIAGNOSIS — Z431 Encounter for attention to gastrostomy: Secondary | ICD-10-CM | POA: Diagnosis not present

## 2013-09-26 DIAGNOSIS — Z48815 Encounter for surgical aftercare following surgery on the digestive system: Secondary | ICD-10-CM | POA: Diagnosis not present

## 2013-09-26 DIAGNOSIS — J189 Pneumonia, unspecified organism: Secondary | ICD-10-CM | POA: Diagnosis not present

## 2013-09-26 DIAGNOSIS — G4730 Sleep apnea, unspecified: Secondary | ICD-10-CM | POA: Diagnosis not present

## 2013-09-26 DIAGNOSIS — Z86711 Personal history of pulmonary embolism: Secondary | ICD-10-CM | POA: Diagnosis not present

## 2013-09-26 DIAGNOSIS — Z8546 Personal history of malignant neoplasm of prostate: Secondary | ICD-10-CM | POA: Diagnosis not present

## 2013-09-27 ENCOUNTER — Encounter (INDEPENDENT_AMBULATORY_CARE_PROVIDER_SITE_OTHER): Payer: Self-pay | Admitting: General Surgery

## 2013-09-27 ENCOUNTER — Ambulatory Visit (INDEPENDENT_AMBULATORY_CARE_PROVIDER_SITE_OTHER): Payer: Medicare Other | Admitting: General Surgery

## 2013-09-27 VITALS — BP 123/84 | HR 66 | Temp 97.1°F | Resp 14 | Ht 68.0 in | Wt 159.2 lb

## 2013-09-27 DIAGNOSIS — Z09 Encounter for follow-up examination after completed treatment for conditions other than malignant neoplasm: Secondary | ICD-10-CM

## 2013-09-27 NOTE — Progress Notes (Signed)
Subjective:     Patient ID: Jon White, male   DOB: 1933/04/08, 80 y.o.   MRN: [id number]  HPI 63 yom s/p patch of perforated duodenal ulcer. Did egd at same time due to concern of location which in retrospect may have been because of a diverticulum.  I also placed g tube at the same time.  He is eating without difficulty.  His appetite is improving.  He has no n/v. Having bms, increasing his activity.  He comes in today without any real complaints  Review of Systems     Objective:   Physical Exam Abdomen soft, nontender, wound nearly healed by secondary intention, g tube in place    Assessment:     S/p graham patch    Plan:     Will remove g tube in a couple weeks.  Continue dressing changes.  He overall is really doing well. Continue increasing activity.

## 2013-09-28 DIAGNOSIS — G4730 Sleep apnea, unspecified: Secondary | ICD-10-CM | POA: Diagnosis not present

## 2013-09-28 DIAGNOSIS — J189 Pneumonia, unspecified organism: Secondary | ICD-10-CM | POA: Diagnosis not present

## 2013-09-28 DIAGNOSIS — Z48815 Encounter for surgical aftercare following surgery on the digestive system: Secondary | ICD-10-CM | POA: Diagnosis not present

## 2013-09-28 DIAGNOSIS — Z86711 Personal history of pulmonary embolism: Secondary | ICD-10-CM | POA: Diagnosis not present

## 2013-09-28 DIAGNOSIS — Z8546 Personal history of malignant neoplasm of prostate: Secondary | ICD-10-CM | POA: Diagnosis not present

## 2013-09-28 DIAGNOSIS — Z431 Encounter for attention to gastrostomy: Secondary | ICD-10-CM | POA: Diagnosis not present

## 2013-10-01 DIAGNOSIS — Z431 Encounter for attention to gastrostomy: Secondary | ICD-10-CM | POA: Diagnosis not present

## 2013-10-01 DIAGNOSIS — Z8546 Personal history of malignant neoplasm of prostate: Secondary | ICD-10-CM | POA: Diagnosis not present

## 2013-10-01 DIAGNOSIS — Z48815 Encounter for surgical aftercare following surgery on the digestive system: Secondary | ICD-10-CM | POA: Diagnosis not present

## 2013-10-01 DIAGNOSIS — G4730 Sleep apnea, unspecified: Secondary | ICD-10-CM | POA: Diagnosis not present

## 2013-10-01 DIAGNOSIS — Z86711 Personal history of pulmonary embolism: Secondary | ICD-10-CM | POA: Diagnosis not present

## 2013-10-01 DIAGNOSIS — J189 Pneumonia, unspecified organism: Secondary | ICD-10-CM | POA: Diagnosis not present

## 2013-10-02 DIAGNOSIS — G4730 Sleep apnea, unspecified: Secondary | ICD-10-CM | POA: Diagnosis not present

## 2013-10-02 DIAGNOSIS — Z431 Encounter for attention to gastrostomy: Secondary | ICD-10-CM | POA: Diagnosis not present

## 2013-10-02 DIAGNOSIS — Z86711 Personal history of pulmonary embolism: Secondary | ICD-10-CM | POA: Diagnosis not present

## 2013-10-02 DIAGNOSIS — Z8546 Personal history of malignant neoplasm of prostate: Secondary | ICD-10-CM | POA: Diagnosis not present

## 2013-10-02 DIAGNOSIS — Z48815 Encounter for surgical aftercare following surgery on the digestive system: Secondary | ICD-10-CM | POA: Diagnosis not present

## 2013-10-02 DIAGNOSIS — J189 Pneumonia, unspecified organism: Secondary | ICD-10-CM | POA: Diagnosis not present

## 2013-10-03 ENCOUNTER — Ambulatory Visit (INDEPENDENT_AMBULATORY_CARE_PROVIDER_SITE_OTHER): Payer: Medicare Other | Admitting: General Practice

## 2013-10-03 DIAGNOSIS — Z86711 Personal history of pulmonary embolism: Secondary | ICD-10-CM | POA: Diagnosis not present

## 2013-10-03 DIAGNOSIS — Z5181 Encounter for therapeutic drug level monitoring: Secondary | ICD-10-CM

## 2013-10-03 DIAGNOSIS — Z431 Encounter for attention to gastrostomy: Secondary | ICD-10-CM | POA: Diagnosis not present

## 2013-10-03 DIAGNOSIS — G4730 Sleep apnea, unspecified: Secondary | ICD-10-CM | POA: Diagnosis not present

## 2013-10-03 DIAGNOSIS — J189 Pneumonia, unspecified organism: Secondary | ICD-10-CM | POA: Diagnosis not present

## 2013-10-03 DIAGNOSIS — Z48815 Encounter for surgical aftercare following surgery on the digestive system: Secondary | ICD-10-CM | POA: Diagnosis not present

## 2013-10-03 DIAGNOSIS — Z8546 Personal history of malignant neoplasm of prostate: Secondary | ICD-10-CM | POA: Diagnosis not present

## 2013-10-03 DIAGNOSIS — I2699 Other pulmonary embolism without acute cor pulmonale: Secondary | ICD-10-CM

## 2013-10-03 LAB — POCT INR: INR: 2.6

## 2013-10-03 NOTE — Progress Notes (Signed)
Pre visit review using our clinic review tool, if applicable. No additional management support is needed unless otherwise documented below in the visit note. 

## 2013-10-04 DIAGNOSIS — J189 Pneumonia, unspecified organism: Secondary | ICD-10-CM | POA: Diagnosis not present

## 2013-10-04 DIAGNOSIS — Z431 Encounter for attention to gastrostomy: Secondary | ICD-10-CM | POA: Diagnosis not present

## 2013-10-04 DIAGNOSIS — Z8546 Personal history of malignant neoplasm of prostate: Secondary | ICD-10-CM | POA: Diagnosis not present

## 2013-10-04 DIAGNOSIS — G4730 Sleep apnea, unspecified: Secondary | ICD-10-CM | POA: Diagnosis not present

## 2013-10-04 DIAGNOSIS — Z48815 Encounter for surgical aftercare following surgery on the digestive system: Secondary | ICD-10-CM | POA: Diagnosis not present

## 2013-10-04 DIAGNOSIS — Z86711 Personal history of pulmonary embolism: Secondary | ICD-10-CM | POA: Diagnosis not present

## 2013-10-05 ENCOUNTER — Encounter (INDEPENDENT_AMBULATORY_CARE_PROVIDER_SITE_OTHER): Payer: Self-pay | Admitting: General Surgery

## 2013-10-05 ENCOUNTER — Ambulatory Visit (INDEPENDENT_AMBULATORY_CARE_PROVIDER_SITE_OTHER): Payer: Medicare Other | Admitting: General Surgery

## 2013-10-05 VITALS — BP 114/70 | HR 70 | Resp 18 | Ht 68.0 in | Wt 159.0 lb

## 2013-10-05 DIAGNOSIS — Z09 Encounter for follow-up examination after completed treatment for conditions other than malignant neoplasm: Secondary | ICD-10-CM

## 2013-10-05 MED ORDER — OXYCODONE-ACETAMINOPHEN 5-325 MG PO TABS: 1.0000 | ORAL_TABLET | ORAL | Status: DC | PRN

## 2013-10-05 NOTE — Progress Notes (Signed)
Subjective:     Patient ID: Jon White, male   DOB: 02-01-1933, 80 y.o.   MRN: [id number]  HPI 69 yom s/p patch of perforated duodenal ulcer. Did egd at same time due to concern of location which in retrospect may have been because of a diverticulum. I also placed g tube at the same time. He is eating without difficulty. His appetite is improving. He has no n/v. Having bms, increasing his activity. He comes in today without any real complaints for g tube removal   Review of Systems     Objective:   Physical Exam Wound clean and granulating in g tube site without infection    Assessment:     S/p graham patch ulcer g tube     Plan:     I removed his gastrostomy tube today without difficulty. I placed a dressing. The limbus was drained for a number of days. If it does not close up in the next week to 10 days I asked them to call me. I gave him a refill on his Percocet. He is also going to begin taking a stool softener. I will plan on seeing him back in one month. He will continue his dressing changes.

## 2013-10-08 DIAGNOSIS — G4730 Sleep apnea, unspecified: Secondary | ICD-10-CM | POA: Diagnosis not present

## 2013-10-08 DIAGNOSIS — Z48815 Encounter for surgical aftercare following surgery on the digestive system: Secondary | ICD-10-CM | POA: Diagnosis not present

## 2013-10-08 DIAGNOSIS — Z86711 Personal history of pulmonary embolism: Secondary | ICD-10-CM | POA: Diagnosis not present

## 2013-10-08 DIAGNOSIS — Z8546 Personal history of malignant neoplasm of prostate: Secondary | ICD-10-CM | POA: Diagnosis not present

## 2013-10-08 DIAGNOSIS — J189 Pneumonia, unspecified organism: Secondary | ICD-10-CM | POA: Diagnosis not present

## 2013-10-08 DIAGNOSIS — Z431 Encounter for attention to gastrostomy: Secondary | ICD-10-CM | POA: Diagnosis not present

## 2013-10-09 ENCOUNTER — Ambulatory Visit (INDEPENDENT_AMBULATORY_CARE_PROVIDER_SITE_OTHER): Payer: Medicare Other | Admitting: Family Medicine

## 2013-10-09 DIAGNOSIS — Z5181 Encounter for therapeutic drug level monitoring: Secondary | ICD-10-CM

## 2013-10-09 DIAGNOSIS — Z48815 Encounter for surgical aftercare following surgery on the digestive system: Secondary | ICD-10-CM | POA: Diagnosis not present

## 2013-10-09 DIAGNOSIS — Z86711 Personal history of pulmonary embolism: Secondary | ICD-10-CM | POA: Diagnosis not present

## 2013-10-09 DIAGNOSIS — Z8546 Personal history of malignant neoplasm of prostate: Secondary | ICD-10-CM | POA: Diagnosis not present

## 2013-10-09 DIAGNOSIS — J189 Pneumonia, unspecified organism: Secondary | ICD-10-CM | POA: Diagnosis not present

## 2013-10-09 DIAGNOSIS — Z431 Encounter for attention to gastrostomy: Secondary | ICD-10-CM | POA: Diagnosis not present

## 2013-10-09 DIAGNOSIS — I2699 Other pulmonary embolism without acute cor pulmonale: Secondary | ICD-10-CM

## 2013-10-09 DIAGNOSIS — G4730 Sleep apnea, unspecified: Secondary | ICD-10-CM | POA: Diagnosis not present

## 2013-10-09 DIAGNOSIS — Z7901 Long term (current) use of anticoagulants: Secondary | ICD-10-CM

## 2013-10-09 LAB — POCT INR: INR: 2.7

## 2013-10-10 DIAGNOSIS — Z86711 Personal history of pulmonary embolism: Secondary | ICD-10-CM | POA: Diagnosis not present

## 2013-10-10 DIAGNOSIS — G4730 Sleep apnea, unspecified: Secondary | ICD-10-CM | POA: Diagnosis not present

## 2013-10-10 DIAGNOSIS — Z8546 Personal history of malignant neoplasm of prostate: Secondary | ICD-10-CM | POA: Diagnosis not present

## 2013-10-10 DIAGNOSIS — Z431 Encounter for attention to gastrostomy: Secondary | ICD-10-CM | POA: Diagnosis not present

## 2013-10-10 DIAGNOSIS — Z48815 Encounter for surgical aftercare following surgery on the digestive system: Secondary | ICD-10-CM | POA: Diagnosis not present

## 2013-10-10 DIAGNOSIS — J189 Pneumonia, unspecified organism: Secondary | ICD-10-CM | POA: Diagnosis not present

## 2013-10-19 DIAGNOSIS — Z48815 Encounter for surgical aftercare following surgery on the digestive system: Secondary | ICD-10-CM | POA: Diagnosis not present

## 2013-10-19 DIAGNOSIS — Z8546 Personal history of malignant neoplasm of prostate: Secondary | ICD-10-CM | POA: Diagnosis not present

## 2013-10-19 DIAGNOSIS — Z431 Encounter for attention to gastrostomy: Secondary | ICD-10-CM | POA: Diagnosis not present

## 2013-10-19 DIAGNOSIS — J189 Pneumonia, unspecified organism: Secondary | ICD-10-CM | POA: Diagnosis not present

## 2013-10-19 DIAGNOSIS — Z86711 Personal history of pulmonary embolism: Secondary | ICD-10-CM | POA: Diagnosis not present

## 2013-10-19 DIAGNOSIS — G4730 Sleep apnea, unspecified: Secondary | ICD-10-CM | POA: Diagnosis not present

## 2013-10-23 ENCOUNTER — Encounter: Payer: Self-pay | Admitting: Internal Medicine

## 2013-10-23 ENCOUNTER — Ambulatory Visit (INDEPENDENT_AMBULATORY_CARE_PROVIDER_SITE_OTHER): Payer: Medicare Other | Admitting: Internal Medicine

## 2013-10-23 ENCOUNTER — Other Ambulatory Visit (INDEPENDENT_AMBULATORY_CARE_PROVIDER_SITE_OTHER): Payer: Medicare Other

## 2013-10-23 VITALS — BP 104/62 | HR 96 | Ht 68.0 in | Wt 157.0 lb

## 2013-10-23 DIAGNOSIS — R5381 Other malaise: Secondary | ICD-10-CM

## 2013-10-23 DIAGNOSIS — I2699 Other pulmonary embolism without acute cor pulmonale: Secondary | ICD-10-CM | POA: Diagnosis not present

## 2013-10-23 DIAGNOSIS — R5383 Other fatigue: Secondary | ICD-10-CM

## 2013-10-23 DIAGNOSIS — J330 Polyp of nasal cavity: Secondary | ICD-10-CM | POA: Diagnosis not present

## 2013-10-23 DIAGNOSIS — J449 Chronic obstructive pulmonary disease, unspecified: Secondary | ICD-10-CM | POA: Diagnosis not present

## 2013-10-23 LAB — TSH: TSH: 2.63 u[IU]/mL (ref 0.35–4.50)

## 2013-10-23 MED ORDER — ALBUTEROL SULFATE HFA 108 (90 BASE) MCG/ACT IN AERS: 2.0000 | INHALATION_SPRAY | Freq: Four times a day (QID) | RESPIRATORY_TRACT | Status: DC | PRN

## 2013-10-23 NOTE — Patient Instructions (Addendum)
#  COPD  - Continuye your medications - refer pulmonary rehab at [place]  #Fatigue  - due to post ICU deconditioning  - check VIt D and TSH levels  - rehab for copd will help you  #PE treatment since June 2013 - CT Dec 2013 and March 2015 without evidence of clot  - check d-dimer levels; will call with results. IF normal, can stop coumadin.. - - If high, continue coumadin but will cut INR goal down to > 1.5  #Sinus   - folloow with Dr Benjamine Mola  #Lung cancer screen  - no evidence of lung cancer as of march 2015 - consider CT chest summer 2016; will decide at followup  #FOllowup  2 months

## 2013-10-23 NOTE — Progress Notes (Signed)
Subjective:    Patient ID: Jon White, male    DOB: April 07, 1933, 80 y.o.   MRN: [id number]  HPI   isit Type:  Follow-up Copy to:  Dr. Emilee Hero Primary Provider/Referring Provider:  Dr Merrilee Seashore PMD, DR [facility] ENT, Dr. Deatra Ina GI, Dr Donneta Romberg - Allergy   reports that he quit smoking about 23 months ago. His smoking use included Cigarettes. He has a 60 pack-year smoking history. He has never used smokeless tobacco.   OV April 2011:  Fu copd/asthma: chronic cough and cough variant asthma.,  Last visits March and November 2010. Since then has seen Dr. Erik Obey who is contemplating sinus surgery if medical Rx fails. He has continued nasal steroid ciclesonide, daily saline wash via nettpot. However, he did see Dr. Donneta Romberg for allergy eval. Reportedly skin testing mildly positive for mold but otherwise negative. Was started on singulair. Afer that sinus drainage is "90% better". Able to cope with current levels of sinus drainage. Using tissue only 4-5 times per day as oppsed to >10-20 times per day. Re: GERD. He is not on PPI. Still having some cough and tickle in throat after he eats. Wants to get rid of it. Does not think this element of cough is related to sinus drainage. No other complaints. No dyspnea. Contnues with symbicort. Due to go back to [facility] for 5 months starting October 29, 2009  Problem # 1:  COUGH (ICD-786.2)   Etiologys a) elated to sinus drainage and mucus; b) Silent GERD  (he has hiatal hernia & esophagitis on ct chest 02/2008 + hx of hiccups + hx of intermittent dysphagia -.sp eso dilataition 07/2008; and c)  Cough variant asthma coexisting based on recurrence of chest tightness after dc of symbicort 07/2008  > On 09/10/2009: improved control after Dr. Donneta Romberg added singulair to regimen in Mid -feb 2011. Stil lhaving some cough that is likely due to GERD versus Habit cough. He is not on PPI and wonder why. He is not concerned about aspirin as cause and is continuing  it  plan Sinus per Dr Erik Obey nad Dr. Donneta Romberg GI per Dr Deatra Ina but will add PPI nexium Cough Variant asthma - contnue symbicort He will call in 1 month. If element of cough that is thought as due to GERD is betteer then we will continue PPI. Otherwise, will consider neurontin for habit cough Will get records from DR sharma     Patient Instructions: 1)  continue your medicines 2)  add nexium to your current regimen 3)  take it on empty stomach 4)  follow GERD diet advice 5)  Call me in 1 month to see if cough even better 6)  Depending on your call I will advise you next step   OV 05/18/2012  Not seen in > 2 years. He has been referred back for new problem of PE. IN November 18, 2011 one week after long car trip to [place] he was helping someone out with some heavy lifting at camp site and then became acutely unwell. Rushed to [facility] ER. Where per hx andreview of recoords: He was diagnosed with DVT left popliteal veint and RLL Sup Segment PE on CT June 201, 2013. Other tests showed: trop < 0.04, ddimer 5.6,  ECHO 11/19/11 - Normal LVEF. Mild TR, Mild PHA. Mild LVH. Rx as PE for 3-4 months with coumadin. Currently dyspneic ever since PE - simple activitis make him dyspneic. Gained 8#. Also 6 days in hospital. Walking desaturation  test on 05/18/2012 185 feet x 3 laps:  did NOT desaturate. Rest pulse ox was 96%, final pulse ox was 91%. HR response 71/min at rest to 89/min at peak exertion.   Of note, CT June 2013 in [facility] per their report (image n/a)  was significant for other findings that include: a) emphysema; b)   abnormal esophagus, hiatal hernia - rec endoscopy, and c) RML and R Anterior Base infiltrates - rad recommending followup   REC In order to sort out your shortness of breath have CT angio chest, echo and PFT  REturn for followup after above  Will refer you to coumadin clinic  OV 06/12/12 Presents with wife. Here to review test results. He is now back on coumadin with INR >  2. Still no change in dyspnea. This is the dyspnea that got worse after the PE diagnosis. Reports no new complaints but main issue is persistent dyspnea class 2-3 along with cough and a severe inability / struggle to bring out sputumwhich is scanty and yellopw. This has made voice hoarse past few weeks. Otherwise all same and he is frustrated. Reviewed meds: compliant with symbicort. He is not on spiriva; tried it bfore but quit due to urinary incontinence. He feels nebs would be better choice for him but willing to try tudorza    CT shows a) no clot but b) has small node in chest and emphysema - all old and unchnaged. However, has 2 mm stone in upper pole left kidney with minimal left renal collecting system dilatation noted; This is called hydronephrosis and he has contacted his urologist about it.   ECHO 1224/13  - PA pressure could not be estimated but essentially nromal Echo so PAP thought to be normal  PFT  06/12/12 PFT FVC fev1 ratio BD fev1 TLC DLCO coments  06/12/2012  2.7L/70% 1.1L/42% 39(67) 38% 6.8L/114% 9.7/56% Copd with asthma component. Huge BD response  07/10/2012   3.8 L/92%   1.97 L/64%   52      almost 1 L improvement since starting  tudorza    Past, Family, Social reviewed: no change since last visit  REC The reasoon you got more short of breath after blood clot treatment is not because of blood clot issues but becasuse asthma/copd is acting up significantly  Take doxycycline 100mg  po twice daily x 5 days; take after meals and avoid sunlight  Please take Take prednisone 40mg  once daily x 3 days, then 30mg  once daily x 3 days, then 20mg  once daily x 3 days, then prednisone 10mg  once daily x 3 days and stop  Continue symbicort 2 puff twice daily  Try tudorza 1 puff twice a day - learn technique. Monitor for urinary retention. IF this is a problem, call us immediately and stop the drug  Call me in 2 weeks to report response  I will see you in 4 weeks - 6 weeks to see progress   IF above strategy does not work , we can try BREO mdi or nebs  At followup do spirometry    OV 07/10/2012 - Jon White returns for followup. After startinng [name] he feels 80% better. On his spirometry his FEV1 has improved almost a liter from 1.1 L to 1.97 L/64%. He continue Symbicort. He still has some residual mucus production that is blotchy postnasal drip and yellow color early in the morning. He uses his nasal steroids diligently but does not use nasal saline spray regularly. He never used Nettie pot or  hypertonic saline spray   - There no new issues and for the spring 2014 he plans to go to [place] as part of his annual spring to summer visit  PFT  06/12/12 PFT FVC fev1 ratio BD fev1 TLC DLCO coments  06/12/2012  2.7L/70% 1.1L/42% 39(67) 38% 6.8L/114% 9.7/56% Copd with asthma component. Huge BD response  07/10/2012   3.8 L/92%   1.97 L/64%   52      almost 1 L improvement since starting  tudorza    #COPD  Continue symbicort 2 puff twice daily  tudorza 1 puff twice a day  Use albuterol as needed  I recommend you take N acetylcysteine 600 mg twice a day.  - There is research from [place] published in [employer] that shows if you takes N-acetylcysteine cuts down acute exacerbation COPD by 20%. The drug functions by being an anti-oxidant. This drug will not make Jon White feel any better but will make the chances of this having an exacerbation less likely The drug is cheap and is over-the-counter at [facility] vitamin store or [employer]. There are no side effects. I have experience with with the drug with pulmonary fibrosis patients so I recommend it strongly  REturn in October 2014 or sooner if needed  Ensure fu in [place] during summer 2014  #Sinus draingae  - use netti pot or 3% neill-med saline spray daily at night for nose  - continue other measures advised by Dr Benjamine Mola  #Folllowup  - Oct 2014     OV 03/19/2013  COPD/asthma,, pulmonary embolism,  chronic rhinosinusitis followup  He is back from [place] for the winter. He has been doing well. He developed urinary retention again with the second anti-cholinergic inhaler namely tud orza. So he is discontinuing himself on Symbicort. No deterioration in respiratory symptoms. He's had a flu shot. Overall COPD stable and cat Score is 14  In terms of chronic rhinosinusitis this is unchanged. He is not fully compliant with his saline and nasal steroids.   Labs: for pulm embolism he continues on Coumadin. Last checked in June 2014 at our Coumadin clinic INR was therapeutic. He is been checking it in [place] and according to his history it has been therapeutic. He plans to re\re register with our Coumadin clinic     04/09/2013 Follow up for COPD  Patient returns for a followup for COPD. Last visit. Patient was changed from [place] to [place] due to urinary retention on Tudorza  Patient says that he has felt improved with decreased shortness of breath. No flare in cough or shortness of breath. Patient denies any chest pain, orthopnea, PND, or leg swelling.  Patient denies any urinary retention or urinary urgency.  spirometry today shows FEV1 at  1.7 liters, 56% , ratio 52%   OV 07/09/2013  Chief Complaint  Patient presents with  . COPD    follow-up. Pt denies any increase in SOB or cough at this time.     Followup COPD:: Feno at Dr Remus Blake office 05/15/13 was 47 and low prob for eos asthma. He did not tolerate  ANORO DUE to double vision and is back on [place]  and Symbicort. He feels this combination was really well for him. COPD cat score is currently 12 and his symptoms are detailed below  Pulmonary embolism: In June 2015 it'll be 2 years and she suffered pulmonary embolus and following road from [place] to [place]. He is been compliant with Coumadin.  Most recent INR 07/02/2013 is greater than 2. There no bleeding episodes. However he is frustrated taking anti-coagulation and  would like to come off. He is agreeable to taking Coumadin for INR greater than 1.5 starting June 2015 to continue to keep the risk of recurrent PE low  Chronic rhinosinusitis:: This is due to a polyp. He is followed by ENT Dr. Elgie Congo. He did have recent prednisone burst and this really helped. He says that his quality-of-life is mostly restricted because of rhinosinusitis. However he is at baseline it is not any worse  Imaging:  Last CT chest 05/19/12  - IMPRESSION:  No evidence of pulmonary embolism.  Changes of COPD and prior right lower lobe surgery.  Stable 4 mm left lower lobe nodule.  Question right renal cyst with 2 mm left kidney stone and minimal  left hydronephrosis.  Original Report Authenticated By: Lavonia Dana, M.D.    Jon White 10/23/2013  Chief Complaint  Patient presents with  . Follow-up    Pt c/o worsening SOB since last OV. C/o mild CP with deep breaths. c/o cough producing white and yellow mucous.     Followup COPD with BD response (low Feno at Dr Remus Blake office 05/15/13). He is  on [place]  and Symbicort. He feels this combination was really well for him. COPD CAT Score worse to 27 compared to baseline but this is because of ICU Stay due to duodenal ulcer perf in March/April 2015.   Pulmonary embolism: In June 2015 it'll be 2 years and she suffered pulmonary embolus and following road from [place] to [place]. He is been compliant with Coumadin. Most recent INR 07/02/2013 is greater than 2 on 10/09/13 . There no bleeding episodes but in march/april 2015 he suffered life threatening duodenal ulcer peforation. So though he is back on coumadin, he would prefer to come off. We agreed to check d-dimer: if normal will dc coumadin. If high, consider  taking Coumadin for INR greater than 1.5 starting June 2015 to continue to keep the risk of recurrent PE low  Chronic rhinosinusitis:: This is due to a polyp. He is followed by ENT Dr. Elgie Congo. Due to see him today   New issue is  Fatigue: This is since hospital dc ffom duodenal ulcer perf. He has never had TSH and Vit D check based on EPIC review.  Iam unable to check Vit D due to "medicare limit:. He is willing to attend rehab for copd. He has finished home PT, OT, RN  CAT COPD Symptom & Quality of Life Score ([employer] trademark) 0 is no burden. 5 is highest burden 03/19/2013  07/09/2013  10/23/2013 Post ICU staty  Never Cough -> Cough all the time 2 2 2   No phlegm in chest -> Chest is full of phlegm 4 2 3   No chest tightness -> Chest feels very tight 2 1 1   No dyspnea for 1 flight stairs/hill -> Very dyspneic for 1 flight of stairs 2 2 4   No limitations for ADL at home -> Very limited with ADL at home 1 1 5   Confident leaving home -> Not at all confident leaving home 0 0 2  Sleep soundly -> Do not sleep soundly because of lung condition 1 1 5   Lots of Energy -> No energy at all 2 3 5   TOTAL Score (max 40)  14 12 27      Review of Systems  Constitutional: Negative for fever and unexpected weight change.  HENT: Positive for rhinorrhea. Negative for  congestion, dental problem, ear pain, nosebleeds, postnasal drip, sinus pressure, sneezing, sore throat and trouble swallowing.   Eyes: Negative for redness and itching.  Respiratory: Positive for cough and shortness of breath. Negative for chest tightness and wheezing.   Cardiovascular: Positive for chest pain. Negative for palpitations and leg swelling.  Gastrointestinal: Negative for nausea and vomiting.  Genitourinary: Negative for dysuria.  Musculoskeletal: Negative for joint swelling.  Skin: Negative for rash.  Neurological: Negative for headaches.  Hematological: Does not bruise/bleed easily.  Psychiatric/Behavioral: Negative for dysphoric mood. The patient is not nervous/anxious.        Objective:   Physical Exam   eneral:  well developed, well nourished, in no acute distress Head:  normocephalic and atraumatic Eyes:  PERRLA/EOM intact; conjunctiva and  sclera clear Ears:  TMs intact and clear with normal canals Nose:  no deformity, discharge, inflammation, or lesions Mouth:  dentures Melampatti Class II.   Neck:  no masses, thyromegaly, or abnormal cervical nodes Chest Wall:  no deformities noted Lungs: No wheezes Heard:  regular rate and rhythm, S1, S2 without murmurs, rubs, gallops, or clicks Abdomen:  bowel sounds positive; abdomen soft and non-tender without masses, or organomegaly but has dressing +. Somewhat non specifically tender + Msk:  no deformity or scoliosis noted with normal posture Pulses:  pulses normal Extremities:  no clubbing, cyanosis, edema, or deformity noted Neurologic:  CN II-XII grossly intact with normal reflexes, coordination, muscle strength and tone Skin:  intact without lesions or rashes Cervical Nodes:  no significant adenopathy Axillary Nodes:  no significant adenopathy Psych:  alert and cooperative; normal mood and affect; normal attention span and concentration        Assessment & Plan:  #COPD  - Continuye your medications - refer pulmonary rehab at [facility]  #Fatigue  - due to post ICU deconditioning  - check VIt D and TSH levels  - rehab for copd will help you  #PE treatment since June 2013 - CT Dec 2013 and March 2015 without evidence of clot  - check d-dimer levels; will call with results. IF normal, can stop coumadin.. - If high, continue coumadin but will cut INR goal down to > 1.5  #Sinus   - folloow with Dr Benjamine Mola  #Lung cancer screen  - no evidence of lung cancer as of march 2015 - consider CT chest summer 2016; will decide at followup  #FOllowup  2 months

## 2013-10-24 ENCOUNTER — Telehealth: Payer: Self-pay | Admitting: Internal Medicine

## 2013-10-24 LAB — D-DIMER, QUANTITATIVE: D-Dimer, Quant: 0.67 ug/mL-FEU — ABNORMAL HIGH (ref 0.00–0.48)

## 2013-10-24 NOTE — Telephone Encounter (Signed)
Called and spoke to pt regarding results and recs per MR. Pt verbalized understanding and denied any further questions or concerns at this time.  

## 2013-10-24 NOTE — Telephone Encounter (Signed)
Let Mr Jon White know  A) D-dimer high: this means if he stops coumadin he will be at increased risk for PE REcurrence esp in post ICU phase. However, ok to talk to his coumadin nurse and cut coumadin dose down for INR goal > 1.5  B) TSH normal. So fatigue likely related to post ICU stay. He should definitely attend rehab   THanks  Dr. Brand Males, M.D., [facility].C.P Pulmonary and Critical Care Medicine Staff Physician [place] Pager: [phone], If no answer or between  15:00h - 7:00h: call [phone]  10/24/2013 11:25 AM

## 2013-10-25 ENCOUNTER — Ambulatory Visit: Payer: Medicare Other

## 2013-10-25 DIAGNOSIS — G4730 Sleep apnea, unspecified: Secondary | ICD-10-CM | POA: Diagnosis not present

## 2013-10-25 DIAGNOSIS — L219 Seborrheic dermatitis, unspecified: Secondary | ICD-10-CM | POA: Diagnosis not present

## 2013-10-25 DIAGNOSIS — Z86711 Personal history of pulmonary embolism: Secondary | ICD-10-CM | POA: Diagnosis not present

## 2013-10-25 DIAGNOSIS — Z431 Encounter for attention to gastrostomy: Secondary | ICD-10-CM | POA: Diagnosis not present

## 2013-10-25 DIAGNOSIS — C4442 Squamous cell carcinoma of skin of scalp and neck: Secondary | ICD-10-CM | POA: Diagnosis not present

## 2013-10-25 DIAGNOSIS — J189 Pneumonia, unspecified organism: Secondary | ICD-10-CM | POA: Diagnosis not present

## 2013-10-25 DIAGNOSIS — L570 Actinic keratosis: Secondary | ICD-10-CM | POA: Diagnosis not present

## 2013-10-25 DIAGNOSIS — Z8546 Personal history of malignant neoplasm of prostate: Secondary | ICD-10-CM | POA: Diagnosis not present

## 2013-10-25 DIAGNOSIS — Z48815 Encounter for surgical aftercare following surgery on the digestive system: Secondary | ICD-10-CM | POA: Diagnosis not present

## 2013-10-25 DIAGNOSIS — C44319 Basal cell carcinoma of skin of other parts of face: Secondary | ICD-10-CM | POA: Diagnosis not present

## 2013-10-26 ENCOUNTER — Telehealth: Payer: Self-pay | Admitting: Internal Medicine

## 2013-10-26 DIAGNOSIS — R5383 Other fatigue: Secondary | ICD-10-CM | POA: Insufficient documentation

## 2013-10-26 NOTE — Assessment & Plan Note (Signed)
#  COPD - stable diseae, CAT Score higher due to post hospital fatigue  - Continuye your medications - refer pulmonary rehab at [facility]

## 2013-10-26 NOTE — Telephone Encounter (Signed)
Hi Ms Jon White  Regarding [name] September 02, 1932 - he will need continued anti  Coagulation because d-dimer still high but ok to reduce INR goal to > 1.5  Thanks  Dr. Brand Males, M.D., [facility].C.P Pulmonary and Critical Care Medicine Staff Physician [place] [facility] Pager: [phone], If no answer or between  15:00h - 7:00h: call [phone]  10/26/2013 3:28 AM   g

## 2013-10-26 NOTE — Assessment & Plan Note (Signed)
#  Fatigue  - due to post ICU deconditioning  - check VIt D and TSH levels (unable to order Vit D due to medicare restriction) - rehab for copd will help you

## 2013-10-26 NOTE — Assessment & Plan Note (Signed)
#  PE treatment since June 2013 - CT Dec 2013 and March 2015 without evidence of clot  - check d-dimer levels; will call with results. IF normal, can stop coumadin.. - - If high, continue coumadin but will cut INR goal down to > 1.5

## 2013-10-29 ENCOUNTER — Telehealth: Payer: Self-pay | Admitting: General Practice

## 2013-10-29 DIAGNOSIS — C61 Malignant neoplasm of prostate: Secondary | ICD-10-CM | POA: Diagnosis not present

## 2013-10-29 NOTE — Telephone Encounter (Signed)
Spoke with patient's wife.  Gave instructions to change coumadin dosage to 4 mg daily except 2 mg on M/W/F.  Please keep INR appointment for 6/11.  Wife verbalized understanding.  Lower patient's goal range to 1.5 per Dr. Chase Caller.

## 2013-10-30 DIAGNOSIS — J189 Pneumonia, unspecified organism: Secondary | ICD-10-CM | POA: Diagnosis not present

## 2013-10-30 DIAGNOSIS — Z48815 Encounter for surgical aftercare following surgery on the digestive system: Secondary | ICD-10-CM | POA: Diagnosis not present

## 2013-10-30 DIAGNOSIS — Z431 Encounter for attention to gastrostomy: Secondary | ICD-10-CM | POA: Diagnosis not present

## 2013-10-30 DIAGNOSIS — Z86711 Personal history of pulmonary embolism: Secondary | ICD-10-CM | POA: Diagnosis not present

## 2013-10-30 DIAGNOSIS — Z8546 Personal history of malignant neoplasm of prostate: Secondary | ICD-10-CM | POA: Diagnosis not present

## 2013-10-30 DIAGNOSIS — G4730 Sleep apnea, unspecified: Secondary | ICD-10-CM | POA: Diagnosis not present

## 2013-11-01 ENCOUNTER — Ambulatory Visit (INDEPENDENT_AMBULATORY_CARE_PROVIDER_SITE_OTHER): Payer: Medicare Other | Admitting: General Surgery

## 2013-11-01 ENCOUNTER — Encounter (INDEPENDENT_AMBULATORY_CARE_PROVIDER_SITE_OTHER): Payer: Self-pay | Admitting: General Surgery

## 2013-11-01 VITALS — BP 110/68 | HR 64 | Temp 98.4°F | Resp 20 | Ht 68.0 in | Wt 155.2 lb

## 2013-11-01 DIAGNOSIS — R5381 Other malaise: Secondary | ICD-10-CM | POA: Diagnosis not present

## 2013-11-01 DIAGNOSIS — Z09 Encounter for follow-up examination after completed treatment for conditions other than malignant neoplasm: Secondary | ICD-10-CM

## 2013-11-01 DIAGNOSIS — R5383 Other fatigue: Secondary | ICD-10-CM | POA: Diagnosis not present

## 2013-11-01 DIAGNOSIS — Z8719 Personal history of other diseases of the digestive system: Secondary | ICD-10-CM

## 2013-11-01 DIAGNOSIS — R531 Weakness: Secondary | ICD-10-CM

## 2013-11-01 LAB — CBC WITH DIFFERENTIAL/PLATELET
BASOS ABS: 0.2 10*3/uL — AB (ref 0.0–0.1)
Basophils Relative: 1 % (ref 0–1)
Eosinophils Absolute: 0 10*3/uL (ref 0.0–0.7)
Eosinophils Relative: 0 % (ref 0–5)
HEMATOCRIT: 30.2 % — AB (ref 39.0–52.0)
Hemoglobin: 10.1 g/dL — ABNORMAL LOW (ref 13.0–17.0)
LYMPHS PCT: 7 % — AB (ref 12–46)
Lymphs Abs: 1.1 10*3/uL (ref 0.7–4.0)
MCH: 29.2 pg (ref 26.0–34.0)
MCHC: 33.4 g/dL (ref 30.0–36.0)
MCV: 87.3 fL (ref 78.0–100.0)
MONO ABS: 2.1 10*3/uL — AB (ref 0.1–1.0)
Monocytes Relative: 14 % — ABNORMAL HIGH (ref 3–12)
Neutro Abs: 11.8 10*3/uL — ABNORMAL HIGH (ref 1.7–7.7)
Neutrophils Relative %: 78 % — ABNORMAL HIGH (ref 43–77)
Platelets: 540 10*3/uL — ABNORMAL HIGH (ref 150–400)
RBC: 3.46 MIL/uL — ABNORMAL LOW (ref 4.22–5.81)
RDW: 15.1 % (ref 11.5–15.5)
WBC: 15.1 10*3/uL — AB (ref 4.0–10.5)

## 2013-11-01 NOTE — Progress Notes (Signed)
Subjective:     Patient ID: Jon White, male   DOB: 09/15/1932, 80 y.o.   MRN: [id number]  HPI 79 yom s/p patch of perforated duodenal ulcer. Did egd at same time due to concern of location and to rule out a cancer which in retrospect may have been because of a diverticulum. I also placed g tube at the same time. He was doing better at our last visit. His g tube has been removed and this has healed. He is due to begin copd rehab.  He has a tremor now also and is cold a lot.  His appetite still is not normal today. He is able to drink liquids and eat soft foods fine. He is having trouble chewing though and he says this is limiting him.   Review of Systems     Objective:   Physical Exam Healing midline incision with small area I applied silver nitrate to, g tube site healed JP sites healing with minimal serous drainage, no infection    Assessment:     S/p graham patch ulcer     Plan:     I think he is just slowly improving. I will check cbc to ensure he is not anemic as source of symptoms above. Will also refer him to see Dr Deatra Ina for consideration of an egd given the abnormal location of ulcer as well as some continued symptoms I can see back after that. He is fine to proceed with copd rehab. I have also asked him to follow up with his pcp for his tremor.

## 2013-11-02 ENCOUNTER — Telehealth (INDEPENDENT_AMBULATORY_CARE_PROVIDER_SITE_OTHER): Payer: Self-pay

## 2013-11-02 DIAGNOSIS — K265 Chronic or unspecified duodenal ulcer with perforation: Secondary | ICD-10-CM

## 2013-11-02 DIAGNOSIS — D72829 Elevated white blood cell count, unspecified: Secondary | ICD-10-CM

## 2013-11-02 LAB — URINALYSIS
BILIRUBIN URINE: NEGATIVE
Glucose, UA: NEGATIVE mg/dL
KETONES UR: NEGATIVE mg/dL
Leukocytes, UA: NEGATIVE
Nitrite: NEGATIVE
PH: 6.5 (ref 5.0–8.0)
Protein, ur: 30 mg/dL — AB
SPECIFIC GRAVITY, URINE: 1.014 (ref 1.005–1.030)
UROBILINOGEN UA: 1 mg/dL (ref 0.0–1.0)

## 2013-11-02 NOTE — Telephone Encounter (Signed)
Called pt this am to check on him after his visit with Dr Donne Hazel yesterday. The pt is doing ok today. The pt said yesterday he felt he could do a 100 yard dash but today not so much he feels more tired today. I advised pt that his labs are ok except the white count is elevated some so Dr Donne Hazel would like for the pt to go get a UA this am to r/o UTI. I explained to the pt that if the UA is normal that Dr Donne Hazel is going to want to order a CT A/P on the pt to make sure there is no abscess going on after the drains were taken out. I advised pt that I would call him back later today once I get the results from the UA. Pt understands.

## 2013-11-02 NOTE — Telephone Encounter (Signed)
Message copied by Illene Regulus on Fri Nov 02, 2013 10:14 AM ------      Message from: Donne Hazel, [place]      Created: Fri Nov 02, 2013  6:31 AM      Regarding: FW:       Jon White      He was having chills yesterday and his wbc is up.  Not sure why.  I think safest to order a ct abdomen to make sure all ok after his tubes came out.  He needs ct ab pelvis with contrast rule out abscess.       Matt      ----- Message -----         From: Lab in [facility] Interface         Sent: 11/01/2013   8:20 PM           To: Rolm Bookbinder, MD       ------

## 2013-11-02 NOTE — Telephone Encounter (Signed)
Called pt and spoke to pt's wife gave her the results of the UA being ok per Dr Donne Hazel. Dr Donne Hazel still feels like the pt should get a CT a/p with contrast due to the elevated white count to make sure we are not missing anything on the pt. The pt has an appt with urology on Monday at 10:15 so we could get the CT in the pm. We will call pt back with the CT date/time. The pt will need to come to the office to p/u contrast. The pt understands.

## 2013-11-05 ENCOUNTER — Ambulatory Visit
Admission: RE | Admit: 2013-11-05 | Discharge: 2013-11-05 | Disposition: A | Discharge status: Home / Self Care | Payer: Medicare Other | Source: Ambulatory Visit | Attending: General Surgery | Admitting: General Surgery

## 2013-11-05 DIAGNOSIS — D72829 Elevated white blood cell count, unspecified: Secondary | ICD-10-CM

## 2013-11-05 DIAGNOSIS — K5730 Diverticulosis of large intestine without perforation or abscess without bleeding: Secondary | ICD-10-CM | POA: Diagnosis not present

## 2013-11-05 DIAGNOSIS — K265 Chronic or unspecified duodenal ulcer with perforation: Secondary | ICD-10-CM

## 2013-11-05 DIAGNOSIS — K4020 Bilateral inguinal hernia, without obstruction or gangrene, not specified as recurrent: Secondary | ICD-10-CM | POA: Diagnosis not present

## 2013-11-05 DIAGNOSIS — K7689 Other specified diseases of liver: Secondary | ICD-10-CM | POA: Diagnosis not present

## 2013-11-05 MED ORDER — IOHEXOL 300 MG/ML  SOLN
100.0000 mL | Freq: Once | INTRAMUSCULAR | Status: AC | PRN
  Administered 2013-11-05: 70 mL via INTRAVENOUS

## 2013-11-06 DIAGNOSIS — C44111 Basal cell carcinoma of skin of unspecified eyelid, including canthus: Secondary | ICD-10-CM | POA: Diagnosis not present

## 2013-11-06 NOTE — Telephone Encounter (Signed)
Called pt to notify him about his CT results from yesterday. I advised pt that his CT looks fine per Dr Donne Hazel. I asked the pt how he was feeling and he said he doing all right. The pt is just concerned about his weight dropping. I advised pt that its not uncommon to loose weight after surgery b/c his appetite is not fully back yet and im sure he is eating smaller meals thru the day. The pt totally agrees with his appetite not being fully back and he does eat smaller portions at meal time. I advised pt that it just takes time for the appetite to come but it will eventually. The pt has a f/u appt with Dr Donne Hazel on 8/6. I advised pt that if any changes occur for the pt to call our office to let us know. The pt understands.

## 2013-11-07 ENCOUNTER — Encounter (HOSPITAL_COMMUNITY): Payer: Medicare Other

## 2013-11-08 ENCOUNTER — Ambulatory Visit (INDEPENDENT_AMBULATORY_CARE_PROVIDER_SITE_OTHER): Payer: Medicare Other | Admitting: General Practice

## 2013-11-08 ENCOUNTER — Telehealth: Payer: Self-pay | Admitting: Internal Medicine

## 2013-11-08 ENCOUNTER — Telehealth: Payer: Self-pay | Admitting: *Deleted

## 2013-11-08 DIAGNOSIS — Z7901 Long term (current) use of anticoagulants: Secondary | ICD-10-CM

## 2013-11-08 DIAGNOSIS — Z5181 Encounter for therapeutic drug level monitoring: Secondary | ICD-10-CM | POA: Diagnosis not present

## 2013-11-08 DIAGNOSIS — I2699 Other pulmonary embolism without acute cor pulmonale: Secondary | ICD-10-CM | POA: Diagnosis not present

## 2013-11-08 LAB — PROTIME-INR: INR: 6.7 ratio (ref 0.8–1.0)

## 2013-11-08 NOTE — Telephone Encounter (Addendum)
Received a call from the lab with critical lab values  PT- 70.8 INR- 6.73  MR was paged  Will await a call back   [facility] to speak with Villa Herb, however, she has left for the day and will not return until 11/09/13  Spoke with Doroteo Bradford at [facility] and will forward to her for recs Thanks!

## 2013-11-08 NOTE — Telephone Encounter (Signed)
Called [place] with PT results. She said she would call patient today.

## 2013-11-08 NOTE — Telephone Encounter (Signed)
Called spoke with pt's wife, advised of lab results INR 6.7.  Advised to have pt hold Coumadin until INR rechecked on Monday 11/12/13.  Advised to go to ED with any bleeding problems.  Pt's wife verbalized understanding.  Pt has an appt on 11/12/13 at the [facility] primary care office for INR recheck at 11:15, pt's wife aware of appt date and time.

## 2013-11-09 NOTE — Progress Notes (Signed)
Pre visit review using our clinic review tool, if applicable. No additional management support is needed unless otherwise documented below in the visit note. 

## 2013-11-12 ENCOUNTER — Ambulatory Visit (INDEPENDENT_AMBULATORY_CARE_PROVIDER_SITE_OTHER): Payer: Medicare Other | Admitting: General Practice

## 2013-11-12 DIAGNOSIS — Z5181 Encounter for therapeutic drug level monitoring: Secondary | ICD-10-CM

## 2013-11-12 DIAGNOSIS — I2699 Other pulmonary embolism without acute cor pulmonale: Secondary | ICD-10-CM

## 2013-11-12 LAB — POCT INR: INR: 1.7

## 2013-11-12 NOTE — Progress Notes (Signed)
Pre visit review using our clinic review tool, if applicable. No additional management support is needed unless otherwise documented below in the visit note. 

## 2013-11-22 ENCOUNTER — Ambulatory Visit (INDEPENDENT_AMBULATORY_CARE_PROVIDER_SITE_OTHER): Payer: Medicare Other | Admitting: General Practice

## 2013-11-22 DIAGNOSIS — I2699 Other pulmonary embolism without acute cor pulmonale: Secondary | ICD-10-CM | POA: Diagnosis not present

## 2013-11-22 DIAGNOSIS — Z5181 Encounter for therapeutic drug level monitoring: Secondary | ICD-10-CM

## 2013-11-22 DIAGNOSIS — Z7901 Long term (current) use of anticoagulants: Secondary | ICD-10-CM | POA: Diagnosis not present

## 2013-11-22 LAB — POCT INR: INR: 1.6

## 2013-11-22 NOTE — Progress Notes (Signed)
Pre visit review using our clinic review tool, if applicable. No additional management support is needed unless otherwise documented below in the visit note. 

## 2013-11-23 ENCOUNTER — Telehealth (INDEPENDENT_AMBULATORY_CARE_PROVIDER_SITE_OTHER): Payer: Self-pay

## 2013-11-23 NOTE — Telephone Encounter (Signed)
Pts wife called stating pt had labs 11-01-13. She states they did get call back re: CT but did not get call back re: labs. She states someone advised pt that levels were abn but he could not remember what was told to him. Pts wife request call back after labs are reviewed with Dr Donne Hazel. She can be reached at [phone].

## 2013-11-26 ENCOUNTER — Encounter: Payer: Self-pay | Admitting: Gastroenterology

## 2013-11-26 NOTE — Telephone Encounter (Signed)
Called to see what I could help them with since I am the one that spoke to the pt several times at the beginning of the month about the labs and CT. The pt was concerned after he was talking to his wife and kids this past week that he wasn't sure about the white count being elevated. The pt didn't know why no one called him back about abnormal labs. I explained to the pt's wife that I did call the pt after his labs of the cbc, the urinalysis, and the CT scan. I explained to the pt's wife that when the white count was elevated some Dr Donne Hazel wanted the pt to get a UA to r/o UTI before we started ordering a CT scan on the pt b/c the pt only symptom was the energy being up and down. I called the next day with the UA results to let pt know that UA was ok per Dr Donne Hazel but the UA still did show moderate amount of blood along with protein comparing these results to the last UA done several months a go this was showing some improvement which I discussed with the pt. The pt's wife advised me at that time the pt was going to his urology appt the next week that he had already scheduled for his prostate check. I then explained that when I talked to the pt that even though Dr Donne Hazel thought the UA was ok he still wanted [place] to order the CT scan just to make sure we were not missing anything with a poss. Abscess or leak after surgery. I advised that I called the pt with the CT results after Dr Donne Hazel reviewed them to let them know that the CT scan was fine as well. The pt advised me that they just got confused and needed clarification b/c their kids starting questioning them about the abnormal wht count. I asked if the pt went to the urology appt but the pt ended up canceling the appt b/c he felt he was seeing too many doctors at that time when he was trying to recover from surgery. The pt has r/s the urology appt for 7/27 with Dr Tresa Moore. The pt also has an appt scheduled for Dr Deatra Ina on 7/10 to get a date scheduled  to be scoped before the pt comes back in August to see Dr Donne Hazel. I asked how the pt was feeling now and the wife reports that he is doing so much better. The pt is eating more now with his diet, has more energy to walk around, getting out of the house, eating dinner out at restaurants, and walking in the neighborhood. I advised that this all sounds much better than at the beginning of the month when the pt had no energy. I advised that I would send this message to Dr Donne Hazel and if they had any more concerns to please call the office. They understand and appreciate me calling to go over everything again.

## 2013-12-07 ENCOUNTER — Encounter: Payer: Self-pay | Admitting: Gastroenterology

## 2013-12-07 ENCOUNTER — Ambulatory Visit (INDEPENDENT_AMBULATORY_CARE_PROVIDER_SITE_OTHER): Payer: Medicare Other | Admitting: Gastroenterology

## 2013-12-07 ENCOUNTER — Telehealth: Payer: Self-pay | Admitting: *Deleted

## 2013-12-07 VITALS — BP 114/72 | HR 92 | Ht 66.5 in | Wt 150.0 lb

## 2013-12-07 DIAGNOSIS — R933 Abnormal findings on diagnostic imaging of other parts of digestive tract: Secondary | ICD-10-CM

## 2013-12-07 DIAGNOSIS — Z1211 Encounter for screening for malignant neoplasm of colon: Secondary | ICD-10-CM | POA: Insufficient documentation

## 2013-12-07 DIAGNOSIS — K222 Esophageal obstruction: Secondary | ICD-10-CM

## 2013-12-07 NOTE — Telephone Encounter (Signed)
  12/07/2013   RE: Jon White DOB: [birth year] MRN: [id number]   Dear Dr Ashby Dawes   We have scheduled the above patient for an endoscopic procedure. Our records show that he is on anticoagulation therapy.   Please advise as to how long the patient may come off his therapy of coumadin prior to the procedure, which is scheduled for  02/06/2014  Please fax back/ or route the completed form to [facility] fax   Sincerely,    Genella Mech

## 2013-12-07 NOTE — Assessment & Plan Note (Signed)
Plan screening colonoscopy which will be done at the same time as his EGD while holding Coumadin.  The risk of holding anticoagulation therapy or antiplatelet medications was discussed including the increased risk for thromboembolic disease that may include DVT, pulmonary emboli and stroke. The patient understands this risk and is willing to proceed with temporally holding the medication provided that this is approved by his pulmonologist.

## 2013-12-07 NOTE — Assessment & Plan Note (Signed)
Patient is having intermittent dysphagia which likely is 2 to a recurrent stricture.  Recommendations #1 EGD with dilation as indicated

## 2013-12-07 NOTE — Assessment & Plan Note (Signed)
By CT there is a persistent abnormality in the gastric antrum raising the question of a mass.  This certainly could be related to inflammatory changes secondary to his perforation.  Neoplasm is less likely.  Recommendations #1 EGD

## 2013-12-07 NOTE — Patient Instructions (Signed)

## 2013-12-07 NOTE — Progress Notes (Signed)
_                                                                                                                History of Present Illness: 80 year old white male with history of COPD, prostate cancer, sleep apnea, renal insufficiency, esophageal stricture, pulmonary embolism, on Coumadin, referred for endoscopic followup following surgery for perforated duodenal ulcer.  He has spontaneous perforation in April, 2015.  Initial CT on March 31 raises the question of a possible filling defect in the gastric antrum/duodenum but this was not confirmed by endoscopy.  Followup CT in June, 2015 demonstrated thickening of the antral wall with an ill-defined appearance and stranding of adjacent fat extending to the right anterior abdominal wall.  Patient has had a slow postoperative recovery but has made continuous progress.  His main complaint is of fatigue and some unsteadiness of gait.  Patient was seen in January, 2014 for consideration of colonoscopy.  Last exam was over 15 years ago.  He has no lower GI complaints including change of bowel habits, abdominal pain or rectal bleeding.  Patient does complain of intermittent dysphagia to solids.  Denies pyrosis.  He has occasional hiccups.  He is on Protonix.    Past Medical History  Diagnosis Date  . Prostate cancer 1992    in remission.  s/p XRT  . Emphysema   . Allergic rhinitis   . Asthma   . COPD (chronic obstructive pulmonary disease)   . Recurrent upper respiratory infection (URI)     coughing from chronic sinusitis   . Normal cardiac stress test     reports stress test was several yrs. ago /w [employer]. Dr. Rogelia Mire, WNL   . OSA (obstructive sleep apnea) 2007    CPAP- report of settings on paper chart, seen by [facility] - seen 07/2011   . Renal stone     some passed spontaneous, & also had cystoscopy  . Arthritis     hands  . Neuromuscular disorder     reports shaking, somedays "can't hold spoon" - generalized   shakiness, is going to talk to MD about it at next appt.   . High cholesterol   . Shortness of breath on exertion   . H/O hiatal hernia   . Osteoarthritis, generalized   . Chronic renal insufficiency, stage III (moderate)   . Mixed hyperlipidemia   . PE (pulmonary embolism) 10/2011   Past Surgical History  Procedure Laterality Date  . Lung surgery  1986    presumed from RLL. Segmentectomy. Benign Nodule.   . Sinus endo w/fusion  sch. 09/15/2011    Procedure: ENDOSCOPIC SINUS SURGERY WITH FUSION NAVIGATION;  Surgeon: Ascencion Dike, MD;  Location: [facility];  Service: ENT;  Laterality: Bilateral;  bilateral maxillary antrostomy, bilateral endoscopic ethmoidectomy, bilateral sphenoidectomy, bilateral frontal recess exploration  . Septoplasty  09/15/11  . Cataract extraction w/ intraocular lens  implant, bilateral  ~ 2000  . Ureteroscopy  1980's  . Sinus endo w/fusion  09/15/2011  Procedure: ENDOSCOPIC SINUS SURGERY WITH FUSION NAVIGATION;  Surgeon: Ascencion Dike, MD;  Location: [place];  Service: ENT;  Laterality: Bilateral;  . Maxillary antrostomy  09/15/2011    Procedure: MAXILLARY ANTROSTOMY;  Surgeon: Ascencion Dike, MD;  Location: [facility] OR;  Service: ENT;  Laterality: Bilateral;  TOTAL MAXILLARY ANTROSTOMY  . Sinus exploration  09/15/2011    Procedure: SINUS EXPLORATION;  Surgeon: Ascencion Dike, MD;  Location: [facility] OR;  Service: ENT;  Laterality: Bilateral;  FRONTAL RECESS EXPLORATION  . Sphenoidectomy  09/15/2011    Procedure: Coralee Pesa;  Surgeon: Ascencion Dike, MD;  Location: [place];  Service: ENT;  Laterality: Bilateral;  . Septoplasty  09/15/2011    Procedure: SEPTOPLASTY;  Surgeon: Ascencion Dike, MD;  Location: [place];  Service: ENT;  Laterality: Bilateral;  . Laparotomy N/A 08/28/2013    Procedure: EXPLORATORY LAPAROTOMY;  Surgeon: Rolm Bookbinder, MD;  Location: [place];  Service: General;  Laterality: N/A;  . Repair of perforated ulcer N/A 08/28/2013    Procedure: DUODENAL ULCER PATCH;   Surgeon: Rolm Bookbinder, MD;  Location: [place];  Service: General;  Laterality: N/A;  . Gastrostomy N/A 08/28/2013    Procedure: GASTROSTOMY;  Surgeon: Rolm Bookbinder, MD;  Location: [place];  Service: General;  Laterality: N/A;  . Esophagogastroduodenoscopy N/A 08/28/2013    Procedure: ESOPHAGOGASTRODUODENOSCOPY (EGD);  Surgeon: Gayland Curry, MD;  Location: [place];  Service: General;  Laterality: N/A;  . Melanoma excision Left     under left eye   family history includes Asthma in his father; Heart disease in his mother; Prostate cancer in his brother, brother, and brother. There is no history of Anesthesia problems. Current Outpatient Prescriptions  Medication Sig Dispense Refill  . Aclidinium Bromide (TUDORZA PRESSAIR) 400 MCG/ACT AEPB Inhale 1 puff into the lungs 2 (two) times daily.      [name] Kitchen albuterol (VENTOLIN HFA) 108 (90 BASE) MCG/ACT inhaler Inhale 2 puffs into the lungs every 6 (six) hours as needed for wheezing or shortness of breath.  1 Inhaler  2  . atorvastatin (LIPITOR) 10 MG tablet Take 10 mg by mouth daily.       . budesonide-formoterol (SYMBICORT) 160-4.5 MCG/ACT inhaler Inhale 2 puffs into the lungs 2 (two) times daily.  2 Inhaler  0  . Calcium Carbonate-Vitamin D (CALTRATE 600+D) 600-400 MG-UNIT per tablet Take 1 tablet by mouth 2 (two) times daily. With meals.      . cetirizine (ZYRTEC) 10 MG tablet Take 10 mg by mouth daily as needed for allergies.       . fluocinonide cream (LIDEX) 7.10 % Apply 1 application topically daily. To infected area.      . fluticasone (FLONASE) 50 MCG/ACT nasal spray Place 2 sprays into the nose daily. Each nostril once a day.      . ketoconazole (NIZORAL) 2 % shampoo       . Multiple Vitamin (MULTIVITAMIN WITH MINERALS) TABS tablet Take 1 tablet by mouth daily.      [name] Kitchen oxyCODONE-acetaminophen (ROXICET) 5-325 MG per tablet Take 1-2 tablets by mouth every 4 (four) hours as needed for severe pain.  20 tablet  0  . pantoprazole (PROTONIX) 40 MG  tablet Take 1 tablet (40 mg total) by mouth 2 (two) times daily.  60 tablet  3  . warfarin (COUMADIN) 4 MG tablet Take 4 mg by mouth every evening.       No current facility-administered medications for this visit.   Allergies as of 12/07/2013 -  Review Complete 12/07/2013  Allergen Reaction Noted  . Mometasone furoate Other (See Comments) 08/03/2011  . Naproxen sodium Nausea And Vomiting 09/08/2011  . Anoro ellipta [umeclidinium-vilanterol]  07/09/2013    reports that he quit smoking about 2 years ago. His smoking use included Cigarettes. He has a 60 pack-year smoking history. He has never used smokeless tobacco. He reports that he drinks alcohol. He reports that he does not use illicit drugs.     Review of Systems: Patient has baseline dyspnea on exertion.  He has a tremor and this unsteadiness of gait.  Pertinent positive and negative review of systems were noted in the above HPI section. All other review of systems were otherwise negative.  Vital signs were reviewed in today's medical record Physical Exam: General: Well developed , well nourished, no acute distress Skin: anicteric Head: Normocephalic and atraumatic Eyes:  sclerae anicteric, EOMI Ears: Normal auditory acuity Mouth: No deformity or lesions Neck: Supple, no masses or thyromegaly Lungs: Decreased breath sounds throughout to auscultation Heart: Regular rate and rhythm; no murmurs, rubs or bruits Abdomen: Soft, non  and non distended. No masses, hepatosplenomegaly or hernias noted. Normal Bowel sounds.  There is mild tenderness in the right upper quadrant to deep palpation.  They're healing surgical wounds appear Rectal:deferred Musculoskeletal: Symmetrical with no gross deformities  Skin: No lesions on visible extremities Pulses:  Normal pulses noted Extremities: No clubbing, cyanosis, edema or deformities noted Neurological: Alert oriented x 4, grossly nonfocal Cervical Nodes:  No significant cervical  adenopathy Inguinal Nodes: No significant inguinal adenopathy Psychological:  Alert and cooperative. Normal mood and affect  See Assessment and Plan under Problem List

## 2013-12-07 NOTE — Assessment & Plan Note (Signed)
Patient has stable pulmonary function 

## 2013-12-11 ENCOUNTER — Ambulatory Visit (INDEPENDENT_AMBULATORY_CARE_PROVIDER_SITE_OTHER): Payer: Medicare Other | Admitting: Internal Medicine

## 2013-12-11 ENCOUNTER — Encounter (INDEPENDENT_AMBULATORY_CARE_PROVIDER_SITE_OTHER): Payer: Self-pay | Admitting: General Surgery

## 2013-12-11 ENCOUNTER — Telehealth: Payer: Self-pay | Admitting: Family Medicine

## 2013-12-11 ENCOUNTER — Ambulatory Visit (INDEPENDENT_AMBULATORY_CARE_PROVIDER_SITE_OTHER): Payer: Medicare Other | Admitting: General Surgery

## 2013-12-11 ENCOUNTER — Encounter: Payer: Self-pay | Admitting: Internal Medicine

## 2013-12-11 ENCOUNTER — Telehealth (INDEPENDENT_AMBULATORY_CARE_PROVIDER_SITE_OTHER): Payer: Self-pay

## 2013-12-11 ENCOUNTER — Telehealth: Payer: Self-pay | Admitting: Internal Medicine

## 2013-12-11 VITALS — BP 124/70 | HR 87 | Ht 68.0 in | Wt 150.0 lb

## 2013-12-11 VITALS — BP 128/70 | HR 72 | Temp 98.6°F | Ht 68.0 in | Wt 149.0 lb

## 2013-12-11 DIAGNOSIS — J449 Chronic obstructive pulmonary disease, unspecified: Secondary | ICD-10-CM | POA: Diagnosis not present

## 2013-12-11 DIAGNOSIS — T8140XA Infection following a procedure, unspecified, initial encounter: Secondary | ICD-10-CM | POA: Diagnosis not present

## 2013-12-11 DIAGNOSIS — T814XXA Infection following a procedure, initial encounter: Principal | ICD-10-CM

## 2013-12-11 DIAGNOSIS — IMO0001 Reserved for inherently not codable concepts without codable children: Secondary | ICD-10-CM

## 2013-12-11 DIAGNOSIS — T814XXD Infection following a procedure, subsequent encounter: Principal | ICD-10-CM

## 2013-12-11 MED ORDER — FLUTICASONE FUROATE-VILANTEROL 100-25 MCG/INH IN AEPB: 1.0000 | INHALATION_SPRAY | Freq: Every day | RESPIRATORY_TRACT | Status: DC

## 2013-12-11 MED ORDER — AMOXICILLIN-POT CLAVULANATE 875-125 MG PO TABS: 1.0000 | ORAL_TABLET | Freq: Two times a day (BID) | ORAL | Status: AC

## 2013-12-11 NOTE — Telephone Encounter (Signed)
Notified Coumadin Clinic at [facility] that pt is being started on Augmentin 875mg  BID x 10 days per Dr Donne Hazel. Misty told me that [name] is out of the Coumadin clinic for vacation so I might need to notify his doctor that manages the Coumadin. I will call Dr Rockne Menghini

## 2013-12-11 NOTE — Progress Notes (Signed)
Subjective:    Patient ID: Jon White, male    DOB: 04-30-1933, 80 y.o.   MRN: [id number]  HPI  PI   isit Type:  Follow-up Copy to:  Dr. Emilee Hero Primary Provider/Referring Provider:  Dr Merrilee Seashore PMD, DR Javon [facility], Dr. Deatra Ina GI, Dr Donneta Romberg - Allergy   reports that he quit smoking about 23 months ago. His smoking use included Cigarettes. He has a 60 pack-year smoking history. He has never used smokeless tobacco.   OV April 2011:  Fu copd/asthma: chronic cough and cough variant asthma.,  Last visits March and November 2010. Since then has seen Dr. Erik Obey who is contemplating sinus surgery if medical Rx fails. He has continued nasal steroid ciclesonide, daily saline wash via nettpot. However, he did see Dr. Donneta Romberg for allergy eval. Reportedly skin testing mildly positive for mold but otherwise negative. Was started on singulair. Afer that sinus drainage is "90% better". Able to cope with current levels of sinus drainage. Using tissue only 4-5 times per day as oppsed to >10-20 times per day. Re: GERD. He is not on PPI. Still having some cough and tickle in throat after he eats. Wants to get rid of it. Does not think this element of cough is related to sinus drainage. No other complaints. No dyspnea. Contnues with symbicort. Due to go back to [facility] for 5 months starting October 29, 2009  Problem # 1:  COUGH (ICD-786.2)   Etiologys a) elated to sinus drainage and mucus; b) Silent GERD  (he has hiatal hernia & esophagitis on ct chest 02/2008 + hx of hiccups + hx of intermittent dysphagia -.sp eso dilataition 07/2008; and c)  Cough variant asthma coexisting based on recurrence of chest tightness after dc of symbicort 07/2008  > On 09/10/2009: improved control after Dr. Donneta Romberg added singulair to regimen in Mid -feb 2011. Stil lhaving some cough that is likely due to GERD versus Habit cough. He is not on PPI and wonder why. He is not concerned about aspirin as cause and is  continuing it  plan Sinus per Dr Erik Obey nad Dr. Donneta Romberg GI per Dr Deatra Ina but will add PPI nexium Cough Variant asthma - contnue symbicort He will call in 1 month. If element of cough that is thought as due to GERD is betteer then we will continue PPI. Otherwise, will consider neurontin for habit cough Will get records from DR sharma     Patient Instructions: 1)  continue your medicines 2)  add nexium to your current regimen 3)  take it on empty stomach 4)  follow GERD diet advice 5)  Call me in 1 month to see if cough even better 6)  Depending on your call I will advise you next step   OV 05/18/2012  Not seen in > 2 years. He has been referred back for new problem of PE. IN November 18, 2011 one week after long car trip to [place] he was helping someone out with some heavy lifting at camp site and then became acutely unwell. Rushed to [facility] ER. Where per hx andreview of recoords: He was diagnosed with DVT left popliteal veint and RLL Sup Segment PE on CT June 201, 2013. Other tests showed: trop < 0.04, ddimer 5.6,  ECHO 11/19/11 - Normal LVEF. Mild TR, Mild PHA. Mild LVH. Rx as PE for 3-4 months with coumadin. Currently dyspneic ever since PE - simple activitis make him dyspneic. Gained 8#. Also 6 days in hospital.  Walking desaturation test on 05/18/2012 185 feet x 3 laps:  did NOT desaturate. Rest pulse ox was 96%, final pulse ox was 91%. HR response 71/min at rest to 89/min at peak exertion.   Of note, CT June 2013 [facility] per their report (image n/a)  was significant for other findings that include: a) emphysema; b)   abnormal esophagus, hiatal hernia - rec endoscopy, and c) RML and R Anterior Base infiltrates - rad recommending followup   REC In order to sort out your shortness of breath have CT angio chest, echo and PFT  REturn for followup after above  Will refer you to coumadin clinic  OV 06/12/12 Presents with wife. Here to review test results. He is now back on coumadin  with INR > 2. Still no change in dyspnea. This is the dyspnea that got worse after the PE diagnosis. Reports no new complaints but main issue is persistent dyspnea class 2-3 along with cough and a severe inability / struggle to bring out sputumwhich is scanty and yellopw. This has made voice hoarse past few weeks. Otherwise all same and he is frustrated. Reviewed meds: compliant with symbicort. He is not on spiriva; tried it bfore but quit due to urinary incontinence. He feels nebs would be better choice for him but willing to try tudorza    CT shows a) no clot but b) has small node in chest and emphysema - all old and unchnaged. However, has 2 mm stone in upper pole left kidney with minimal left renal collecting system dilatation noted; This is called hydronephrosis and he has contacted his urologist about it.   ECHO 1224/13  - PA pressure could not be estimated but essentially nromal Echo so PAP thought to be normal  PFT  06/12/12 PFT FVC fev1 ratio BD fev1 TLC DLCO coments  06/12/2012  2.7L/70% 1.1L/42% 39(67) 38% 6.8L/114% 9.7/56% Copd with asthma component. Huge BD response  07/10/2012   3.8 L/92%   1.97 L/64%   52      almost 1 L improvement since starting  tudorza    Past, Family, Social reviewed: no change since last visit  REC The reasoon you got more short of breath after blood clot treatment is not because of blood clot issues but becasuse asthma/copd is acting up significantly  Take doxycycline 100mg  po twice daily x 5 days; take after meals and avoid sunlight  Please take Take prednisone 40mg  once daily x 3 days, then 30mg  once daily x 3 days, then 20mg  once daily x 3 days, then prednisone 10mg  once daily x 3 days and stop  Continue symbicort 2 puff twice daily  Try tudorza 1 puff twice a day - learn technique. Monitor for urinary retention. IF this is a problem, call us immediately and stop the drug  Call me in 2 weeks to report response  I will see you in 4 weeks - 6 weeks to see  progress  IF above strategy does not work , we can try BREO mdi or nebs  At followup do spirometry    OV 07/10/2012 - [name] returns for followup. After startinng [name] he feels 80% better. On his spirometry his FEV1 has improved almost a liter from 1.1 L to 1.97 L/64%. He continue Symbicort. He still has some residual mucus production that is blotchy postnasal drip and yellow color early in the morning. He uses his nasal steroids diligently but does not use nasal saline spray regularly. He never used [employer]  pot or hypertonic saline spray   - There no new issues and for the spring 2014 he plans to go to [place] as part of his annual spring to summer visit  PFT  06/12/12 PFT FVC fev1 ratio BD fev1 TLC DLCO coments  06/12/2012  2.7L/70% 1.1L/42% 39(67) 38% 6.8L/114% 9.7/56% Copd with asthma component. Huge BD response  07/10/2012   3.8 L/92%   1.97 L/64%   52      almost 1 L improvement since starting  tudorza    #COPD  Continue symbicort 2 puff twice daily  tudorza 1 puff twice a day  Use albuterol as needed  I recommend you take N acetylcysteine 600 mg twice a day.  - There is research from [place] published in [employer] that shows if you takes N-acetylcysteine cuts down acute exacerbation COPD by 20%. The drug functions by being an anti-oxidant. This drug will not make [name] feel any better but will make the chances of this having an exacerbation less likely The drug is cheap and is over-the-counter at [facility] vitamin store or [employer]. There are no side effects. I have experience with with the drug with pulmonary fibrosis patients so I recommend it strongly  REturn in October 2014 or sooner if needed  Ensure fu in [place] during summer 2014  #Sinus draingae  - use netti pot or 3% neill-med saline spray daily at night for nose  - continue other measures advised by Dr Benjamine Mola  #Folllowup  - Oct 2014     OV 03/19/2013  COPD/asthma,, pulmonary  embolism, chronic rhinosinusitis followup  He is back from [place] for the winter. He has been doing well. He developed urinary retention again with the second anti-cholinergic inhaler namely tud orza. So he is discontinuing himself on Symbicort. No deterioration in respiratory symptoms. He's had a flu shot. Overall COPD stable and cat Score is 14  In terms of chronic rhinosinusitis this is unchanged. He is not fully compliant with his saline and nasal steroids.   Labs: for pulm embolism he continues on Coumadin. Last checked in June 2014 at our Coumadin clinic INR was therapeutic. He is been checking it in [place] and according to his history it has been therapeutic. He plans to re\re register with our Coumadin clinic     04/09/2013 Follow up for COPD  Patient returns for a followup for COPD. Last visit. Patient was changed from [place] to [place] due to urinary retention on Tudorza  Patient says that he has felt improved with decreased shortness of breath. No flare in cough or shortness of breath. Patient denies any chest pain, orthopnea, PND, or leg swelling.  Patient denies any urinary retention or urinary urgency.  spirometry today shows FEV1 at  1.7 liters, 56% , ratio 52%   OV 07/09/2013  Chief Complaint  Patient presents with  . COPD    follow-up. Pt denies any increase in SOB or cough at this time.     Followup COPD:: Feno at Dr Remus Blake office 05/15/13 was 87 and low prob for eos asthma. He did not tolerate  ANORO DUE to double vision and is back on [place]  and Symbicort. He feels this combination was really well for him. COPD cat score is currently 12 and his symptoms are detailed below  Pulmonary embolism: In June 2015 it'll be 2 years and she suffered pulmonary embolus and following road from [place] to [place]. He is been compliant  with Coumadin. Most recent INR 07/02/2013 is greater than 2. There no bleeding episodes. However he is frustrated taking  anti-coagulation and would like to come off. He is agreeable to taking Coumadin for INR greater than 1.5 starting June 2015 to continue to keep the risk of recurrent PE low  Chronic rhinosinusitis:: This is due to a polyp. He is followed by ENT Dr. Elgie Congo. He did have recent prednisone burst and this really helped. He says that his quality-of-life is mostly restricted because of rhinosinusitis. However he is at baseline it is not any worse  Imaging:  Last CT chest 05/19/12  - IMPRESSION:  No evidence of pulmonary embolism.  Changes of COPD and prior right lower lobe surgery.  Stable 4 mm left lower lobe nodule.  Question right renal cyst with 2 mm left kidney stone and minimal  left hydronephrosis.  Original Report Authenticated By: Lavonia Dana, M.D.    Ok Edwards 10/23/2013  Chief Complaint  Patient presents with  . Follow-up    Pt c/o worsening SOB since last OV. C/o mild CP with deep breaths. c/o cough producing white and yellow mucous.     Followup COPD with BD response (low Feno at Dr Remus Blake office 05/15/13). He is  on [place]  and Symbicort. He feels this combination was really well for him. COPD CAT Score worse to 27 compared to baseline but this is because of ICU Stay due to duodenal ulcer perf in March/April 2015.   Pulmonary embolism: In June 2015 it'll be 2 years and she suffered pulmonary embolus and following road from [place] to [place]. He is been compliant with Coumadin. Most recent INR 07/02/2013 is greater than 2 on 10/09/13 . There no bleeding episodes but in march/april 2015 he suffered life threatening duodenal ulcer peforation. So though he is back on coumadin, he would prefer to come off. We agreed to check d-dimer: if normal will dc coumadin. If high, consider  taking Coumadin for INR greater than 1.5 starting June 2015 to continue to keep the risk of recurrent PE low  Chronic rhinosinusitis:: This is due to a polyp. He is followed by ENT Dr. Elgie Congo. Due to see him  today   New issue is Fatigue: This is since hospital dc ffom duodenal ulcer perf. He has never had TSH and Vit D check based on EPIC review.  Iam unable to check Vit D due to "medicare limit:. He is willing to attend rehab for copd. He has finished home PT, OT, RN   #COPD  - Continuye your medications - refer pulmonary rehab at [facility]  #Fatigue  - due to post ICU deconditioning  - check VIt D and TSH levels  - rehab for copd will help you  #PE treatment since June 2013 - CT Dec 2013 and March 2015 without evidence of clot  - check d-dimer levels; will call with results. IF normal, can stop coumadin.. - - If high, continue coumadin but will cut INR goal down to > 1.5  #Sinus   - folloow with Dr Benjamine Mola  #Lung cancer screen  - no evidence of lung cancer as of march 2015 - consider CT chest summer 2016; will decide at followup  #FOllowup  2 months    OV 12/11/2013  Chief Complaint  Patient presents with  . Follow-up    Pt states the symbicort isn't helping like it use to. C/o DOE, cough with intermittent clear, yellow and white mucous production. Pt states he had  chest soreness d/t coughing that lasted several days.    Followup  - COPD: he is on tudorza and symbicort. He finishes physical therapy after  ICU stay and he said that he was doing well after that but since they stopped his dyspnea is worse. His sputum volume appears more than usual by mild margin but the sputum color is good and white  and unchanged. There is no worsening cough. He is not so sure that isn't a COPD exacerbation but he feels that his Symbicort is not helping him anymore   - Pulmnaryh embolism: His INR goal is now greater than 1.5. We checked a d-dimer in June 2015 and it was still high. Which is completed over 2 years of treatment so we reduced the INR goal greater than 1.5 but we opted to continue Coumadin   - New issue: He said his abdomen was hurting him I examined this he has new onset  cellulitis around the abdominal incision wound along with the onset post drainage. He will see his surgeon today at 1:15 PM and I made sure that he got this acute appointment     Review of Systems  Constitutional: Negative for fever and unexpected weight change.  HENT: Negative for congestion, dental problem, ear pain, nosebleeds, postnasal drip, rhinorrhea, sinus pressure, sneezing, sore throat and trouble swallowing.   Eyes: Negative for redness and itching.  Respiratory: Positive for cough and shortness of breath. Negative for chest tightness and wheezing.   Cardiovascular: Negative for palpitations and leg swelling.  Gastrointestinal: Positive for abdominal pain. Negative for nausea and vomiting.  Genitourinary: Negative for dysuria.  Musculoskeletal: Negative for joint swelling.  Skin: Negative for rash.  Neurological: Negative for headaches.  Hematological: Does not bruise/bleed easily.  Psychiatric/Behavioral: Negative for dysphoric mood. The patient is not nervous/anxious.        Objective:   Physical Exam    Filed Vitals:   12/11/13 1209  BP: 124/70  Pulse: 87  Height: 5\' 8"  (1.727 m)  Weight: 150 lb (68.04 kg)  SpO2: 97%     eneral:  well developed, well nourished, in no acute distress Head:  normocephalic and atraumatic Eyes:  PERRLA/EOM intact; conjunctiva and sclera clear Ears:  TMs intact and clear with normal canals Nose:  no deformity, discharge, inflammation, or lesions Mouth:  dentures Melampatti Class II.   Neck:  no masses, thyromegaly, or abnormal cervical nodes Chest Wall:  no deformities noted Lungs: No wheezes Heard:  regular rate and rhythm, S1, S2 without murmurs, rubs, gallops, or clicks Abdomen: NEW ONSET RED CELULITIS 5-7cm around incision in midline that is red, warm, tender, indurated with new pus drainage Msk:  no deformity or scoliosis noted with normal posture Pulses:  pulses normal Extremities:  no clubbing, cyanosis, edema, or  deformity noted Neurologic:  CN II-XII grossly intact with normal reflexes, coordination, muscle strength and tone Skin:  intact without lesions or rashes Cervical Nodes:  no significant adenopathy Axillary Nodes:  no significant adenopathy Psych:  alert and cooperative; normal mood and affect; normal attention span and concentration        Assessment & Plan:  #COPD - not sure if you are more short of breath due to post ICU fatigue or mild copd flare up  - for now let us change your inhalers and see  - change symbicort to BREO 1 puff daily  - continue tudorza 1 puff twice daily - re-refer home physical therapy  #Fatigue  - re-refer home PT  #  PE treatment since June 2013 - continue coumadin with  INR goal down to > 1.5 - reassess with D-dimer in summer 2016  #Sinus   - folloow with Dr Benjamine Mola  #Abdomainal wall wound infection and cellulitis  - see DR Donne Hazel at 13.15h 12/11/2013 - antibiotic through them should help lung  #FOllowup 4 weeks with NP Tammy to make sure changes going ok   > 50% of this > 25 min visit spent in face to face counseling (15 min visit converted to 25 min)

## 2013-12-11 NOTE — Telephone Encounter (Signed)
Place orders in epic for [facility] home health to come do daily dressing changes and pt has someone to be taught dressing changes.

## 2013-12-11 NOTE — Telephone Encounter (Signed)
Spoke to the nurse from Dr. Cristal Generous office.  Pt is to start Augment 875 mg for 10 days due to post op wound infection in his abdomen.  Informed the nurse that Verlon Setting is out of the office and advised that she also inform the patient's PCP.  She will call Dr. Ashby Dawes and notifiy his/her office.

## 2013-12-11 NOTE — Telephone Encounter (Signed)
ATC Dr. Benson Norway office.  Receptionists is going to try and find out who called our office and have them give [place] a call back

## 2013-12-11 NOTE — Telephone Encounter (Signed)
LM for Dr Golden Pop nurse notifying them that pt is being started on Augmentin 875mg  BID x 10 days per Dr Donne Hazel.

## 2013-12-11 NOTE — Patient Instructions (Addendum)
#  COPD - not sure if you are more short of breath due to post ICU fatigue or mild copd flare up  - for now let us change your inhalers and see  - change symbicort to BREO 1 puff daily  - continue tudorza 1 puff twice daily - re-refer home physical therapy  #Fatigue  - re-refer home PT  #PE treatment since June 2013 - continue coumadin with  INR goal down to > 1.5 - reassess with D-dimer in summer 2016  #Sinus   - folloow with Dr Benjamine Mola  #Abdomainal wall wound infection and cellulitis  - see DR Donne Hazel at 13.15h 12/11/2013 - antibiotic through them should help lung  #FOllowup 4 weeks with NP Tammy to make sure changes going ok

## 2013-12-11 NOTE — Progress Notes (Signed)
Subjective:     Patient ID: Jon White, male   DOB: [birth year], 80 y.o.   MRN: [id number]  HPI 81 yom doing much better after perforated ulcer repair finally getting appetite back. Has multiple cormorbidities and was seeing pulmonary this am found to have likely wound infection. This just started this am and was draining at office today. Eating fine. tol diet, no n/v having bms, no fevers.   Review of Systems     Objective:   Physical Exam 6-7 cm area of erythema around 3 mm draining wound, cloudy fluid present    Assessment:     Wound infection     Plan:     I opened this area more after anesthetizing it.  Drained some murky fluid.  Did not violate fascia.  Packed.  Will give 10d abx, recheck next week, get home health involved for dressing changes.

## 2013-12-12 ENCOUNTER — Ambulatory Visit (INDEPENDENT_AMBULATORY_CARE_PROVIDER_SITE_OTHER): Payer: Medicare Other

## 2013-12-12 ENCOUNTER — Telehealth (INDEPENDENT_AMBULATORY_CARE_PROVIDER_SITE_OTHER): Payer: Self-pay | Admitting: *Deleted

## 2013-12-12 DIAGNOSIS — Z4800 Encounter for change or removal of nonsurgical wound dressing: Secondary | ICD-10-CM

## 2013-12-12 NOTE — Progress Notes (Signed)
Pt of Dr. Cristal Generous with post op wound infection is here today for wound dressing change and packing removal.  1/4" Iodoform gauze removed.  Wound looks fine, and was repacked and covered with a gauze dressing and paper tape.  The patient's wife says she is feeling more comfortable about the dressing changes.  She would like one more appt with [place] before she begins changing the dressing on her own.  Nurse only appt made for tomorrow at 2:45p.m.

## 2013-12-12 NOTE — Telephone Encounter (Signed)
Per [name] she will call pt today. I called made pt aware. Nothing further needed

## 2013-12-12 NOTE — Telephone Encounter (Signed)
Called spoke with pt. He reports Dr. Cristal Generous office contacted the coumadin clinic yesterday. Was not what to do about his coumadin since he is being placed on ABX. I called cindy boyd [phone] and LMTCB x1

## 2013-12-12 NOTE — Telephone Encounter (Signed)
Pt is calling back now [phone] stated we were supposed to call him back

## 2013-12-12 NOTE — Telephone Encounter (Signed)
Pt called regarding [facility] for his wound.  I advised pt that the order has been put in, [facility] said they could not accept the pt at this time.  I advised pt that we have faxed an order to [facility] and was waiting to hear from them.  Pt was concerned about having his wound changed today.  I advised pt that me and Dr. Cristal Generous nurse Lars Mage, had already discussed it and if we haven't heard anything from [place] by lunch, that Lars Mage was going to call pt and have him come in for a nurse visit.  Pt verbalized understanding.  Anderson Malta

## 2013-12-12 NOTE — Telephone Encounter (Signed)
Called pt to notify him that we have not heard back from [facility] yet about if they are going to accept the pt for home health. I advised pt that he would need to come to the office for a nurse visit to have packing removed and repacked per Dr Donne Hazel. Pt understands and made appt for 2:45.

## 2013-12-13 ENCOUNTER — Encounter (INDEPENDENT_AMBULATORY_CARE_PROVIDER_SITE_OTHER): Payer: Medicare Other

## 2013-12-13 ENCOUNTER — Ambulatory Visit: Payer: Medicare Other

## 2013-12-13 DIAGNOSIS — T8140XA Infection following a procedure, unspecified, initial encounter: Secondary | ICD-10-CM | POA: Diagnosis not present

## 2013-12-13 DIAGNOSIS — I2699 Other pulmonary embolism without acute cor pulmonale: Secondary | ICD-10-CM | POA: Diagnosis not present

## 2013-12-13 DIAGNOSIS — Z4800 Encounter for change or removal of nonsurgical wound dressing: Secondary | ICD-10-CM | POA: Diagnosis not present

## 2013-12-13 DIAGNOSIS — Z7901 Long term (current) use of anticoagulants: Secondary | ICD-10-CM | POA: Diagnosis not present

## 2013-12-13 DIAGNOSIS — Z5181 Encounter for therapeutic drug level monitoring: Secondary | ICD-10-CM | POA: Diagnosis not present

## 2013-12-13 DIAGNOSIS — J441 Chronic obstructive pulmonary disease with (acute) exacerbation: Secondary | ICD-10-CM | POA: Diagnosis not present

## 2013-12-14 ENCOUNTER — Ambulatory Visit (INDEPENDENT_AMBULATORY_CARE_PROVIDER_SITE_OTHER): Payer: Medicare Other | Admitting: General Practice

## 2013-12-14 ENCOUNTER — Telehealth: Payer: Self-pay | Admitting: General Practice

## 2013-12-14 ENCOUNTER — Telehealth: Payer: Self-pay | Admitting: Internal Medicine

## 2013-12-14 DIAGNOSIS — I2699 Other pulmonary embolism without acute cor pulmonale: Secondary | ICD-10-CM | POA: Diagnosis not present

## 2013-12-14 DIAGNOSIS — Z7901 Long term (current) use of anticoagulants: Secondary | ICD-10-CM

## 2013-12-14 DIAGNOSIS — Z5181 Encounter for therapeutic drug level monitoring: Secondary | ICD-10-CM | POA: Diagnosis not present

## 2013-12-14 LAB — POCT INR: INR: 1.5

## 2013-12-14 NOTE — Progress Notes (Signed)
Pre visit review using our clinic review tool, if applicable. No additional management support is needed unless otherwise documented below in the visit note. 

## 2013-12-14 NOTE — Telephone Encounter (Signed)
Called and spoke to [place] with [facility]. She stated that [facility] prescribed skilled nursing visit d/t wound care. Because [facility] could not take on the additional order of the skilled nursing visits along with PT, both orders were forwarded through [place]. Now [facility] will carry out both orders of skilled nursing and PT. I called [facility] to be sure both orders have been received and nothing further was needed, [facility] confirmed they did receive the orders and nothing further is needed.   MR- this is just an FYI that the pt is receiving wound care with skilled nursing visits AND PT through [facility].

## 2013-12-14 NOTE — Telephone Encounter (Signed)
Orders faxed to [facility] to check INR on 12/21/13 with results called to Villa Herb, RN @ [phone].

## 2013-12-17 DIAGNOSIS — J441 Chronic obstructive pulmonary disease with (acute) exacerbation: Secondary | ICD-10-CM | POA: Diagnosis not present

## 2013-12-17 DIAGNOSIS — I2699 Other pulmonary embolism without acute cor pulmonale: Secondary | ICD-10-CM | POA: Diagnosis not present

## 2013-12-17 DIAGNOSIS — T8140XA Infection following a procedure, unspecified, initial encounter: Secondary | ICD-10-CM | POA: Diagnosis not present

## 2013-12-17 DIAGNOSIS — Z4800 Encounter for change or removal of nonsurgical wound dressing: Secondary | ICD-10-CM | POA: Diagnosis not present

## 2013-12-17 DIAGNOSIS — Z5181 Encounter for therapeutic drug level monitoring: Secondary | ICD-10-CM | POA: Diagnosis not present

## 2013-12-17 DIAGNOSIS — Z7901 Long term (current) use of anticoagulants: Secondary | ICD-10-CM | POA: Diagnosis not present

## 2013-12-20 ENCOUNTER — Ambulatory Visit (INDEPENDENT_AMBULATORY_CARE_PROVIDER_SITE_OTHER): Payer: Medicare Other | Admitting: General Surgery

## 2013-12-20 ENCOUNTER — Encounter (INDEPENDENT_AMBULATORY_CARE_PROVIDER_SITE_OTHER): Payer: Self-pay | Admitting: General Surgery

## 2013-12-20 VITALS — BP 104/62 | HR 80 | Resp 18 | Ht 68.0 in | Wt 149.0 lb

## 2013-12-20 DIAGNOSIS — Z09 Encounter for follow-up examination after completed treatment for conditions other than malignant neoplasm: Secondary | ICD-10-CM

## 2013-12-20 NOTE — Progress Notes (Signed)
Subjective:     Patient ID: Jon White, male   DOB: 02-Sep-1932, 80 y.o.   MRN: [id number]  HPI 28 yom s/p perforated ulcer repair who I saw a couple weeks ago with a superficial wound infection.  I opened this and he has been on abx since then. This is getting packed daily.  It has resolved and he returns today doing well  Review of Systems     Objective:   Physical Exam Small superficial opening that is being packed , no erythema, no more infection    Assessment:     ssi     Plan:     This has resolved. Continue packing and should heal by secondary intention. Will return in 2 weeks.  Does not need more abx.

## 2013-12-21 ENCOUNTER — Ambulatory Visit: Payer: Medicare Other | Admitting: General Practice

## 2013-12-21 DIAGNOSIS — J441 Chronic obstructive pulmonary disease with (acute) exacerbation: Secondary | ICD-10-CM | POA: Diagnosis not present

## 2013-12-21 DIAGNOSIS — Z7901 Long term (current) use of anticoagulants: Secondary | ICD-10-CM | POA: Diagnosis not present

## 2013-12-21 DIAGNOSIS — I2699 Other pulmonary embolism without acute cor pulmonale: Secondary | ICD-10-CM | POA: Diagnosis not present

## 2013-12-21 DIAGNOSIS — Z5181 Encounter for therapeutic drug level monitoring: Secondary | ICD-10-CM | POA: Diagnosis not present

## 2013-12-21 DIAGNOSIS — T8140XA Infection following a procedure, unspecified, initial encounter: Secondary | ICD-10-CM | POA: Diagnosis not present

## 2013-12-21 DIAGNOSIS — Z4800 Encounter for change or removal of nonsurgical wound dressing: Secondary | ICD-10-CM | POA: Diagnosis not present

## 2013-12-21 LAB — POCT INR: INR: 1.6

## 2013-12-21 NOTE — Progress Notes (Signed)
Pre visit review using our clinic review tool, if applicable. No additional management support is needed unless otherwise documented below in the visit note. 

## 2013-12-24 ENCOUNTER — Telehealth: Payer: Self-pay | Admitting: Internal Medicine

## 2013-12-24 DIAGNOSIS — Z4800 Encounter for change or removal of nonsurgical wound dressing: Secondary | ICD-10-CM | POA: Diagnosis not present

## 2013-12-24 DIAGNOSIS — J441 Chronic obstructive pulmonary disease with (acute) exacerbation: Secondary | ICD-10-CM | POA: Diagnosis not present

## 2013-12-24 DIAGNOSIS — I2699 Other pulmonary embolism without acute cor pulmonale: Secondary | ICD-10-CM | POA: Diagnosis not present

## 2013-12-24 DIAGNOSIS — Z7901 Long term (current) use of anticoagulants: Secondary | ICD-10-CM | POA: Diagnosis not present

## 2013-12-24 DIAGNOSIS — Z5181 Encounter for therapeutic drug level monitoring: Secondary | ICD-10-CM | POA: Diagnosis not present

## 2013-12-24 DIAGNOSIS — T8140XA Infection following a procedure, unspecified, initial encounter: Secondary | ICD-10-CM | POA: Diagnosis not present

## 2013-12-24 NOTE — Telephone Encounter (Signed)
Fine with me

## 2013-12-24 NOTE — Telephone Encounter (Signed)
Please advise Dr Melvyn Novas if you can give verbal order in Dr Golden Pop absence as he will not be available until 01/05/14.   [facility] needing verbal order for home health physical therapy -Pt needs physical therapy 2 times a week for 3 weeks  (P) [phone]  [facility] is needing call back soon. Thanks.

## 2013-12-24 NOTE — Telephone Encounter (Signed)
Called and lmom for jessica---given the VO for PT 2 times a week x 3 weeks.

## 2013-12-25 DIAGNOSIS — Z5181 Encounter for therapeutic drug level monitoring: Secondary | ICD-10-CM | POA: Diagnosis not present

## 2013-12-25 DIAGNOSIS — T8140XA Infection following a procedure, unspecified, initial encounter: Secondary | ICD-10-CM | POA: Diagnosis not present

## 2013-12-25 DIAGNOSIS — J441 Chronic obstructive pulmonary disease with (acute) exacerbation: Secondary | ICD-10-CM | POA: Diagnosis not present

## 2013-12-25 DIAGNOSIS — Z4800 Encounter for change or removal of nonsurgical wound dressing: Secondary | ICD-10-CM | POA: Diagnosis not present

## 2013-12-25 DIAGNOSIS — I2699 Other pulmonary embolism without acute cor pulmonale: Secondary | ICD-10-CM | POA: Diagnosis not present

## 2013-12-25 DIAGNOSIS — Z7901 Long term (current) use of anticoagulants: Secondary | ICD-10-CM | POA: Diagnosis not present

## 2013-12-27 DIAGNOSIS — I2699 Other pulmonary embolism without acute cor pulmonale: Secondary | ICD-10-CM | POA: Diagnosis not present

## 2013-12-27 DIAGNOSIS — Z7901 Long term (current) use of anticoagulants: Secondary | ICD-10-CM | POA: Diagnosis not present

## 2013-12-27 DIAGNOSIS — T8140XA Infection following a procedure, unspecified, initial encounter: Secondary | ICD-10-CM | POA: Diagnosis not present

## 2013-12-27 DIAGNOSIS — Z4800 Encounter for change or removal of nonsurgical wound dressing: Secondary | ICD-10-CM | POA: Diagnosis not present

## 2013-12-27 DIAGNOSIS — J441 Chronic obstructive pulmonary disease with (acute) exacerbation: Secondary | ICD-10-CM | POA: Diagnosis not present

## 2013-12-27 DIAGNOSIS — Z5181 Encounter for therapeutic drug level monitoring: Secondary | ICD-10-CM | POA: Diagnosis not present

## 2014-01-01 ENCOUNTER — Ambulatory Visit: Payer: Medicare Other | Admitting: Family Medicine

## 2014-01-01 DIAGNOSIS — Z5181 Encounter for therapeutic drug level monitoring: Secondary | ICD-10-CM | POA: Diagnosis not present

## 2014-01-01 DIAGNOSIS — Z4800 Encounter for change or removal of nonsurgical wound dressing: Secondary | ICD-10-CM | POA: Diagnosis not present

## 2014-01-01 DIAGNOSIS — Z7901 Long term (current) use of anticoagulants: Secondary | ICD-10-CM | POA: Diagnosis not present

## 2014-01-01 DIAGNOSIS — I2699 Other pulmonary embolism without acute cor pulmonale: Secondary | ICD-10-CM

## 2014-01-01 DIAGNOSIS — C61 Malignant neoplasm of prostate: Secondary | ICD-10-CM | POA: Diagnosis not present

## 2014-01-01 DIAGNOSIS — Q6239 Other obstructive defects of renal pelvis and ureter: Secondary | ICD-10-CM | POA: Diagnosis not present

## 2014-01-01 DIAGNOSIS — N200 Calculus of kidney: Secondary | ICD-10-CM | POA: Diagnosis not present

## 2014-01-01 DIAGNOSIS — T8140XA Infection following a procedure, unspecified, initial encounter: Secondary | ICD-10-CM | POA: Diagnosis not present

## 2014-01-01 DIAGNOSIS — J441 Chronic obstructive pulmonary disease with (acute) exacerbation: Secondary | ICD-10-CM | POA: Diagnosis not present

## 2014-01-01 LAB — POCT INR: INR: 1.4

## 2014-01-03 ENCOUNTER — Ambulatory Visit (INDEPENDENT_AMBULATORY_CARE_PROVIDER_SITE_OTHER): Payer: Medicare Other | Admitting: General Surgery

## 2014-01-03 ENCOUNTER — Encounter (INDEPENDENT_AMBULATORY_CARE_PROVIDER_SITE_OTHER): Payer: Self-pay | Admitting: General Surgery

## 2014-01-03 VITALS — BP 124/76 | HR 80 | Temp 97.7°F | Ht 68.0 in | Wt 151.0 lb

## 2014-01-03 DIAGNOSIS — Z09 Encounter for follow-up examination after completed treatment for conditions other than malignant neoplasm: Secondary | ICD-10-CM

## 2014-01-03 NOTE — Progress Notes (Signed)
Subjective:     Patient ID: Jon White, male   DOB: 1932/07/25, 80 y.o.   MRN: [id number]  HPI 46 yom s/p perforated ulcer repair who had a superficial wound infection. I opened this and they have been doing dressing changes. This is doing much better and nearly healed. He still has some occasional drainage from lateral jp site that is serous.  Review of Systems     Objective:   Physical Exam Small superficial opening that is being packed , no erythema, no more infection does not probe to any depth  jp site with small amount serous fluid and superficially open, no infection    Assessment:     Resolving SSI     Plan:     He is doing well.  This should be healed soon.  I will make appt for 6 weeks but if healed he can cancel

## 2014-01-04 DIAGNOSIS — Z5181 Encounter for therapeutic drug level monitoring: Secondary | ICD-10-CM | POA: Diagnosis not present

## 2014-01-04 DIAGNOSIS — I2699 Other pulmonary embolism without acute cor pulmonale: Secondary | ICD-10-CM | POA: Diagnosis not present

## 2014-01-04 DIAGNOSIS — Z7901 Long term (current) use of anticoagulants: Secondary | ICD-10-CM | POA: Diagnosis not present

## 2014-01-04 DIAGNOSIS — Z4800 Encounter for change or removal of nonsurgical wound dressing: Secondary | ICD-10-CM | POA: Diagnosis not present

## 2014-01-04 DIAGNOSIS — T8140XA Infection following a procedure, unspecified, initial encounter: Secondary | ICD-10-CM | POA: Diagnosis not present

## 2014-01-04 DIAGNOSIS — J441 Chronic obstructive pulmonary disease with (acute) exacerbation: Secondary | ICD-10-CM | POA: Diagnosis not present

## 2014-01-08 ENCOUNTER — Ambulatory Visit: Payer: Medicare Other | Admitting: *Deleted

## 2014-01-08 DIAGNOSIS — Z5181 Encounter for therapeutic drug level monitoring: Secondary | ICD-10-CM

## 2014-01-08 DIAGNOSIS — Z4800 Encounter for change or removal of nonsurgical wound dressing: Secondary | ICD-10-CM | POA: Diagnosis not present

## 2014-01-08 DIAGNOSIS — Z7901 Long term (current) use of anticoagulants: Secondary | ICD-10-CM | POA: Diagnosis not present

## 2014-01-08 DIAGNOSIS — I2699 Other pulmonary embolism without acute cor pulmonale: Secondary | ICD-10-CM

## 2014-01-08 DIAGNOSIS — T8140XA Infection following a procedure, unspecified, initial encounter: Secondary | ICD-10-CM | POA: Diagnosis not present

## 2014-01-08 DIAGNOSIS — J441 Chronic obstructive pulmonary disease with (acute) exacerbation: Secondary | ICD-10-CM | POA: Diagnosis not present

## 2014-01-08 LAB — POCT INR: INR: 1.3

## 2014-01-09 ENCOUNTER — Ambulatory Visit (INDEPENDENT_AMBULATORY_CARE_PROVIDER_SITE_OTHER): Payer: Medicare Other | Admitting: Adult Health

## 2014-01-09 ENCOUNTER — Encounter: Payer: Self-pay | Admitting: Adult Health

## 2014-01-09 VITALS — BP 100/68 | HR 91 | Temp 97.9°F | Ht 68.0 in | Wt 151.4 lb

## 2014-01-09 DIAGNOSIS — I2699 Other pulmonary embolism without acute cor pulmonale: Secondary | ICD-10-CM | POA: Diagnosis not present

## 2014-01-09 DIAGNOSIS — J449 Chronic obstructive pulmonary disease, unspecified: Secondary | ICD-10-CM | POA: Diagnosis not present

## 2014-01-09 MED ORDER — ACLIDINIUM BROMIDE 400 MCG/ACT IN AEPB: 1.0000 | INHALATION_SPRAY | Freq: Two times a day (BID) | RESPIRATORY_TRACT | Status: DC

## 2014-01-09 NOTE — Patient Instructions (Signed)
Continue on BREO 1 puff daily  Continue tudorza 1 puff twice daily Continue follow up with coumadin clinic  follow up Dr. Chase Caller in 2 months and As needed

## 2014-01-09 NOTE — Assessment & Plan Note (Signed)
Continue on BREO 1 puff daily  Continue tudorza 1 puff twice daily follow up Dr. Chase Caller in 2 months and As needed

## 2014-01-09 NOTE — Progress Notes (Signed)
Subjective:    Patient ID: Jon White, male    DOB: [birth year], 80 y.o.   MRN: [id number]  HPI  PI   isit Type:  Follow-up Copy to:  Dr. Emilee Hero Primary Provider/Referring Provider:  Dr Merrilee Seashore PMD, DR [facility] ENT, Dr. Deatra Ina GI, Dr Donneta Romberg - Allergy   reports that he quit smoking about 23 months ago. His smoking use included Cigarettes. He has a 60 pack-year smoking history. He has never used smokeless tobacco.   OV April 2011:  Fu copd/asthma: chronic cough and cough variant asthma.,  Last visits March and November 2010. Since then has seen Dr. Erik Obey who is contemplating sinus surgery if medical Rx fails. He has continued nasal steroid ciclesonide, daily saline wash via nettpot. However, he did see Dr. Donneta Romberg for allergy eval. Reportedly skin testing mildly positive for mold but otherwise negative. Was started on singulair. Afer that sinus drainage is "90% better". Able to cope with current levels of sinus drainage. Using tissue only 4-5 times per day as oppsed to >10-20 times per day. Re: GERD. He is not on PPI. Still having some cough and tickle in throat after he eats. Wants to get rid of it. Does not think this element of cough is related to sinus drainage. No other complaints. No dyspnea. Contnues with symbicort. Due to go back to [facility] for 5 months starting October 29, 2009  Problem # 1:  COUGH (ICD-786.2)   Etiologys a) elated to sinus drainage and mucus; b) Silent GERD  (he has hiatal hernia & esophagitis on ct chest 02/2008 + hx of hiccups + hx of intermittent dysphagia -.sp eso dilataition 07/2008; and c)  Cough variant asthma coexisting based on recurrence of chest tightness after dc of symbicort 07/2008  > On 09/10/2009: improved control after Dr. Donneta Romberg added singulair to regimen in Mid -feb 2011. Stil lhaving some cough that is likely due to GERD versus Habit cough. He is not on PPI and wonder why. He is not concerned about aspirin as cause and is  continuing it  plan Sinus per Dr Erik Obey nad Dr. Donneta Romberg GI per Dr Deatra Ina but will add PPI nexium Cough Variant asthma - contnue symbicort He will call in 1 month. If element of cough that is thought as due to GERD is betteer then we will continue PPI. Otherwise, will consider neurontin for habit cough Will get records from DR sharma     Patient Instructions: 1)  continue your medicines 2)  add nexium to your current regimen 3)  take it on empty stomach 4)  follow GERD diet advice 5)  Call me in 1 month to see if cough even better 6)  Depending on your call I will advise you next step   OV 05/18/2012  Not seen in > 2 years. He has been referred back for new problem of PE. IN November 18, 2011 one week after long car trip to [place] he was helping someone out with some heavy lifting at camp site and then became acutely unwell. Rushed to [facility] ER. Where per hx andreview of recoords: He was diagnosed with DVT left popliteal veint and RLL Sup Segment PE on CT June 201, 2013. Other tests showed: trop < 0.04, ddimer 5.6,  ECHO 11/19/11 - Normal LVEF. Mild TR, Mild PHA. Mild LVH. Rx as PE for 3-4 months with coumadin. Currently dyspneic ever since PE - simple activitis make him dyspneic. Gained 8#. Also 6 days in hospital.  Walking desaturation test on 05/18/2012 185 feet x 3 laps:  did NOT desaturate. Rest pulse ox was 96%, final pulse ox was 91%. HR response 71/min at rest to 89/min at peak exertion.   Of note, CT June 2013 in [facility] per their report (image n/a)  was significant for other findings that include: a) emphysema; b)   abnormal esophagus, hiatal hernia - rec endoscopy, and c) RML and R Anterior Base infiltrates - rad recommending followup   REC In order to sort out your shortness of breath have CT angio chest, echo and PFT  REturn for followup after above  Will refer you to coumadin clinic  OV 06/12/12 Presents with wife. Here to review test results. He is now back on coumadin  with INR > 2. Still no change in dyspnea. This is the dyspnea that got worse after the PE diagnosis. Reports no new complaints but main issue is persistent dyspnea class 2-3 along with cough and a severe inability / struggle to bring out sputumwhich is scanty and yellopw. This has made voice hoarse past few weeks. Otherwise all same and he is frustrated. Reviewed meds: compliant with symbicort. He is not on spiriva; tried it bfore but quit due to urinary incontinence. He feels nebs would be better choice for him but willing to try tudorza    CT shows a) no clot but b) has small node in chest and emphysema - all old and unchnaged. However, has 2 mm stone in upper pole left kidney with minimal left renal collecting system dilatation noted; This is called hydronephrosis and he has contacted his urologist about it.   ECHO 1224/13  - PA pressure could not be estimated but essentially nromal Echo so PAP thought to be normal  PFT  06/12/12 PFT FVC fev1 ratio BD fev1 TLC DLCO coments  06/12/2012  2.7L/70% 1.1L/42% 39(67) 38% 6.8L/114% 9.7/56% Copd with asthma component. Huge BD response  07/10/2012   3.8 L/92%   1.97 L/64%   52      almost 1 L improvement since starting  tudorza    Past, Family, Social reviewed: no change since last visit  REC The reasoon you got more short of breath after blood clot treatment is not because of blood clot issues but becasuse asthma/copd is acting up significantly  Take doxycycline 100mg  po twice daily x 5 days; take after meals and avoid sunlight  Please take Take prednisone 40mg  once daily x 3 days, then 30mg  once daily x 3 days, then 20mg  once daily x 3 days, then prednisone 10mg  once daily x 3 days and stop  Continue symbicort 2 puff twice daily  Try tudorza 1 puff twice a day - learn technique. Monitor for urinary retention. IF this is a problem, call us immediately and stop the drug  Call me in 2 weeks to report response  I will see you in 4 weeks - 6 weeks to see  progress  IF above strategy does not work , we can try BREO mdi or nebs  At followup do spirometry    OV 07/10/2012 - [name] returns for followup. After startinng [name] he feels 80% better. On his spirometry his FEV1 has improved almost a liter from 1.1 L to 1.97 L/64%. He continue Symbicort. He still has some residual mucus production that is blotchy postnasal drip and yellow color early in the morning. He uses his nasal steroids diligently but does not use nasal saline spray regularly. He never used [employer]  pot or hypertonic saline spray   - There no new issues and for the spring 2014 he plans to go to [place] as part of his annual spring to summer visit  PFT  06/12/12 PFT FVC fev1 ratio BD fev1 TLC DLCO coments  06/12/2012  2.7L/70% 1.1L/42% 39(67) 38% 6.8L/114% 9.7/56% Copd with asthma component. Huge BD response  07/10/2012   3.8 L/92%   1.97 L/64%   52      almost 1 L improvement since starting  tudorza    #COPD  Continue symbicort 2 puff twice daily  tudorza 1 puff twice a day  Use albuterol as needed  I recommend you take N acetylcysteine 600 mg twice a day.  - There is research from [place] published in [place] that shows if you takes N-acetylcysteine cuts down acute exacerbation COPD by 20%. The drug functions by being an anti-oxidant. This drug will not make [name] feel any better but will make the chances of this having an exacerbation less likely The drug is cheap and is over-the-counter at [facility] vitamin store or [employer]. There are no side effects. I have experience with with the drug with pulmonary fibrosis patients so I recommend it strongly  REturn in October 2014 or sooner if needed  Ensure fu in [place] during summer 2014  #Sinus draingae  - use netti pot or 3% neill-med saline spray daily at night for nose  - continue other measures advised by Dr Benjamine Mola  #Folllowup  - Oct 2014     OV 03/19/2013  COPD/asthma,, pulmonary  embolism, chronic rhinosinusitis followup  He is back from [place] for the winter. He has been doing well. He developed urinary retention again with the second anti-cholinergic inhaler namely tud orza. So he is discontinuing himself on Symbicort. No deterioration in respiratory symptoms. He's had a flu shot. Overall COPD stable and cat Score is 14  In terms of chronic rhinosinusitis this is unchanged. He is not fully compliant with his saline and nasal steroids.   Labs: for pulm embolism he continues on Coumadin. Last checked in June 2014 at our Coumadin clinic INR was therapeutic. He is been checking it in [place] and according to his history it has been therapeutic. He plans to re\re register with our Coumadin clinic     04/09/2013 Follow up for COPD  Patient returns for a followup for COPD. Last visit. Patient was changed from [place] to [place] due to urinary retention on Tudorza  Patient says that he has felt improved with decreased shortness of breath. No flare in cough or shortness of breath. Patient denies any chest pain, orthopnea, PND, or leg swelling.  Patient denies any urinary retention or urinary urgency.  spirometry today shows FEV1 at  1.7 liters, 56% , ratio 52%   OV 07/09/2013  Chief Complaint  Patient presents with  . COPD    follow-up. Pt denies any increase in SOB or cough at this time.     Followup COPD:: Feno at Dr Remus Blake office 05/15/13 was 43 and low prob for eos asthma. He did not tolerate  ANORO DUE to double vision and is back on [place]  and Symbicort. He feels this combination was really well for him. COPD cat score is currently 12 and his symptoms are detailed below  Pulmonary embolism: In June 2015 it'll be 2 years and she suffered pulmonary embolus and following road from [place] to [place]. He is been compliant  with Coumadin. Most recent INR 07/02/2013 is greater than 2. There no bleeding episodes. However he is frustrated taking  anti-coagulation and would like to come off. He is agreeable to taking Coumadin for INR greater than 1.5 starting June 2015 to continue to keep the risk of recurrent PE low  Chronic rhinosinusitis:: This is due to a polyp. He is followed by ENT Dr. Elgie Congo. He did have recent prednisone burst and this really helped. He says that his quality-of-life is mostly restricted because of rhinosinusitis. However he is at baseline it is not any worse  Imaging:  Last CT chest 05/19/12  - IMPRESSION:  No evidence of pulmonary embolism.  Changes of COPD and prior right lower lobe surgery.  Stable 4 mm left lower lobe nodule.  Question right renal cyst with 2 mm left kidney stone and minimal  left hydronephrosis.  Original Report Authenticated By: Lavonia Dana, M.D.    Ok Edwards 10/23/2013  Chief Complaint  Patient presents with  . Follow-up    Pt c/o worsening SOB since last OV. C/o mild CP with deep breaths. c/o cough producing white and yellow mucous.     Followup COPD with BD response (low Feno at Dr Remus Blake office 05/15/13). He is  on [place]  and Symbicort. He feels this combination was really well for him. COPD CAT Score worse to 27 compared to baseline but this is because of ICU Stay due to duodenal ulcer perf in March/April 2015.   Pulmonary embolism: In June 2015 it'll be 2 years and she suffered pulmonary embolus and following road from [place] to [place]. He is been compliant with Coumadin. Most recent INR 07/02/2013 is greater than 2 on 10/09/13 . There no bleeding episodes but in march/april 2015 he suffered life threatening duodenal ulcer peforation. So though he is back on coumadin, he would prefer to come off. We agreed to check d-dimer: if normal will dc coumadin. If high, consider  taking Coumadin for INR greater than 1.5 starting June 2015 to continue to keep the risk of recurrent PE low  Chronic rhinosinusitis:: This is due to a polyp. He is followed by ENT Dr. Elgie Congo. Due to see him  today   New issue is Fatigue: This is since hospital dc ffom duodenal ulcer perf. He has never had TSH and Vit D check based on EPIC review.  Iam unable to check Vit D due to "medicare limit:. He is willing to attend rehab for copd. He has finished home PT, OT, RN   #COPD  - Continuye your medications - refer pulmonary rehab at [place]  #Fatigue  - due to post ICU deconditioning  - check VIt D and TSH levels  - rehab for copd will help you  #PE treatment since June 2013 - CT Dec 2013 and March 2015 without evidence of clot  - check d-dimer levels; will call with results. IF normal, can stop coumadin.. - - If high, continue coumadin but will cut INR goal down to > 1.5  #Sinus   - folloow with Dr Benjamine Mola  #Lung cancer screen  - no evidence of lung cancer as of march 2015 - consider CT chest summer 2016; will decide at followup  #FOllowup  2 months    OV 12/11/2013  Chief Complaint  Patient presents with  . Follow-up    Pt states the symbicort isn't helping like it use to. C/o DOE, cough with intermittent clear, yellow and white mucous production. Pt states he had  chest soreness d/t coughing that lasted several days.    Followup  - COPD: he is on tudorza and symbicort. He finishes physical therapy after  ICU stay and he said that he was doing well after that but since they stopped his dyspnea is worse. His sputum volume appears more than usual by mild margin but the sputum color is good and white  and unchanged. There is no worsening cough. He is not so sure that isn't a COPD exacerbation but he feels that his Symbicort is not helping him anymore   - Pulmnaryh embolism: His INR goal is now greater than 1.5. We checked a d-dimer in June 2015 and it was still high. Which is completed over 2 years of treatment so we reduced the INR goal greater than 1.5 but we opted to continue Coumadin   - New issue: He said his abdomen was hurting him I examined this he has new onset  cellulitis around the abdominal incision wound along with the onset post drainage. He will see his surgeon today at 1:15 PM and I made sure that he got this acute appointment   01/09/2014 Follow up COPD  Returns for COPD follow up  Last ov was changed to [place] from symbicort.  Says he is feeling so much better. Feels that BREO is working very well.   Reports breathing is 75% improved since last ov.  DOE is near back to baseline since beginning the [place] Also had abd cellulitis , tx w/ abx -he says that has cleared up.  Remains on coumadin with INR goal 1.5-2.0 , doing well w/ no known bleeding.  No chest pain,orthopnea, edema or fever.     Review of Systems  Constitutional: Negative for fever and unexpected weight change.  HENT: Negative for congestion, dental problem, ear pain, nosebleeds, postnasal drip, rhinorrhea, sinus pressure, sneezing, sore throat and trouble swallowing.   Eyes: Negative for redness and itching.  Respiratory:  Negative for chest tightness and wheezing.   Cardiovascular: Negative for palpitations and leg swelling.  Gastrointestinal:  . Negative for nausea and vomiting.  Genitourinary: Negative for dysuria.  Musculoskeletal: Negative for joint swelling.  Skin: Negative for rash.  Neurological: Negative for headaches.  Hematological: Does not bruise/bleed easily.  Psychiatric/Behavioral: Negative for dysphoric mood. The patient is not nervous/anxious.        Objective:   Physical Exam   General:  well developed, well nourished, in no acute distress Head:  normocephalic and atraumatic Eyes:  PERRLA/EOM intact; conjunctiva and sclera clear Ears:  TMs intact and clear with normal canals Nose:  no deformity, discharge, inflammation, or lesions Mouth:  dentures Melampatti Class II.   Neck:  no masses, thyromegaly, or abnormal cervical nodes Chest Wall:  no deformities noted Lungs: No wheezes Heard:  regular rate and rhythm, S1, S2 without murmurs, rubs,  gallops, or clicks Abdomen:well healed surgical scar, mild residual scab, no redness  Msk:  no deformity or scoliosis noted with normal posture Pulses:  pulses normal Extremities:  no clubbing, cyanosis, edema, or deformity noted Neurologic:  CN II-XII grossly intact with normal reflexes, coordination, muscle strength and tone Skin:  intact without lesions or rashes Cervical Nodes:  no significant adenopathy Axillary Nodes:  no significant adenopathy Psych:  alert and cooperative; normal mood and affect; normal attention span and concentration        Assessment & Plan:

## 2014-01-09 NOTE — Assessment & Plan Note (Signed)
Cont w/ current therapy with CC follow up

## 2014-01-11 DIAGNOSIS — J441 Chronic obstructive pulmonary disease with (acute) exacerbation: Secondary | ICD-10-CM | POA: Diagnosis not present

## 2014-01-11 DIAGNOSIS — Z7901 Long term (current) use of anticoagulants: Secondary | ICD-10-CM | POA: Diagnosis not present

## 2014-01-11 DIAGNOSIS — Z5181 Encounter for therapeutic drug level monitoring: Secondary | ICD-10-CM | POA: Diagnosis not present

## 2014-01-11 DIAGNOSIS — T8140XA Infection following a procedure, unspecified, initial encounter: Secondary | ICD-10-CM | POA: Diagnosis not present

## 2014-01-11 DIAGNOSIS — Z4800 Encounter for change or removal of nonsurgical wound dressing: Secondary | ICD-10-CM | POA: Diagnosis not present

## 2014-01-11 DIAGNOSIS — I2699 Other pulmonary embolism without acute cor pulmonale: Secondary | ICD-10-CM | POA: Diagnosis not present

## 2014-01-21 ENCOUNTER — Encounter (INDEPENDENT_AMBULATORY_CARE_PROVIDER_SITE_OTHER): Payer: Self-pay | Admitting: General Surgery

## 2014-01-21 ENCOUNTER — Ambulatory Visit (INDEPENDENT_AMBULATORY_CARE_PROVIDER_SITE_OTHER): Payer: Medicare Other | Admitting: General Surgery

## 2014-01-21 VITALS — BP 110/64 | HR 70 | Temp 97.9°F | Ht 68.0 in | Wt 149.5 lb

## 2014-01-21 DIAGNOSIS — Z09 Encounter for follow-up examination after completed treatment for conditions other than malignant neoplasm: Secondary | ICD-10-CM

## 2014-01-21 NOTE — Progress Notes (Signed)
Subjective:     Patient ID: Jon White, male   DOB: 07-22-1932, 80 y.o.   MRN: [id number]  HPI 36 yom s/p perforated ulcer repair complicated by late wound infection. I opened this and has been healing by secondary intention.  They were concerned that there was some purulent drainage and come in today.  He is having some nausea still but otherwise well.  No fevers.   Review of Systems     Objective:   Physical Exam Healing wound with 2 mm opening, no infection    Assessment:     S/p elap, healing wound     Plan:     I think there was just a collection of fluid that opened up. I do not think there is an infection. They will continue conservative care for this all plan on seeing him back in a month.

## 2014-01-24 DIAGNOSIS — D239 Other benign neoplasm of skin, unspecified: Secondary | ICD-10-CM | POA: Diagnosis not present

## 2014-01-24 DIAGNOSIS — L821 Other seborrheic keratosis: Secondary | ICD-10-CM | POA: Diagnosis not present

## 2014-01-24 DIAGNOSIS — Z85828 Personal history of other malignant neoplasm of skin: Secondary | ICD-10-CM | POA: Diagnosis not present

## 2014-01-24 DIAGNOSIS — L723 Sebaceous cyst: Secondary | ICD-10-CM | POA: Diagnosis not present

## 2014-01-24 DIAGNOSIS — L509 Urticaria, unspecified: Secondary | ICD-10-CM | POA: Diagnosis not present

## 2014-01-24 DIAGNOSIS — L570 Actinic keratosis: Secondary | ICD-10-CM | POA: Diagnosis not present

## 2014-01-29 ENCOUNTER — Telehealth: Payer: Self-pay | Admitting: *Deleted

## 2014-01-29 ENCOUNTER — Ambulatory Visit (INDEPENDENT_AMBULATORY_CARE_PROVIDER_SITE_OTHER): Payer: Medicare Other | Admitting: *Deleted

## 2014-01-29 DIAGNOSIS — I2699 Other pulmonary embolism without acute cor pulmonale: Secondary | ICD-10-CM

## 2014-01-29 DIAGNOSIS — Z5181 Encounter for therapeutic drug level monitoring: Secondary | ICD-10-CM

## 2014-01-29 LAB — POCT INR: INR: 1.5

## 2014-01-29 NOTE — Telephone Encounter (Signed)
12/07/2013  RE: Jon White  DOB: 30-Mar-1933  MRN: [id number]  Dear Dr Ashby Dawes  We have scheduled the above patient for an endoscopic procedure. Our records show that he is on anticoagulation therapy.  Please advise as to how long the patient may come off his therapy of coumadin prior to the procedure, which is scheduled for 02/06/2014  Please fax back/ or route the completed form to [place] fax  Sincerely,  Genella Mech              Encounter MyChart Messages     No messages in this encounter          Created by     Oda Kilts, CMA on 12/07/2013 01:28 PM          Visit Pharmacy     [id number] - [place], [place] - [address]

## 2014-01-30 NOTE — Telephone Encounter (Signed)
They stated they do not handle patients coumadin   Thinks its the coumadin clinic

## 2014-01-30 NOTE — Telephone Encounter (Signed)
Pt's Coumadin managed at the [facility] office.  He is a pt of Dr. Chase Caller.  Will forward to him for clearance.

## 2014-01-30 NOTE — Telephone Encounter (Signed)
Called Dr Ashby Dawes to get ok on blood thinner  Sent and faxed over letter with no response

## 2014-01-30 NOTE — Telephone Encounter (Signed)
Hey, I called over there and they said they didn't manage it? Im not sure who to send this to

## 2014-01-31 NOTE — Telephone Encounter (Signed)
Ok to hold coumadin called pt to inform/   Left message for pt

## 2014-01-31 NOTE — Telephone Encounter (Signed)
Hi Jon White and [name]  Will forward to couimadin clinic at [place] who saw him recently. [name] are you not coumadin nurse as well? His INR goal is only > 1.5. He can come off coumadin some 5 days before procedure and restart some 24h after procedure as long as GI feels bleeding risk is minimal. Does not need lovenox bridge   Dr. Brand Males, M.D., [facility].C.P Pulmonary and Critical Care Medicine Staff Physician [place] [facility] Pager: [phone], If no answer or between  15:00h - 7:00h: call [phone]  01/31/2014 8:07 AM

## 2014-02-01 DIAGNOSIS — Z7901 Long term (current) use of anticoagulants: Secondary | ICD-10-CM | POA: Diagnosis not present

## 2014-02-01 DIAGNOSIS — L0291 Cutaneous abscess, unspecified: Secondary | ICD-10-CM | POA: Diagnosis not present

## 2014-02-01 DIAGNOSIS — S31109A Unspecified open wound of abdominal wall, unspecified quadrant without penetration into peritoneal cavity, initial encounter: Secondary | ICD-10-CM | POA: Diagnosis not present

## 2014-02-01 DIAGNOSIS — L0390 Cellulitis, unspecified: Secondary | ICD-10-CM | POA: Diagnosis not present

## 2014-02-06 ENCOUNTER — Encounter: Payer: Medicare Other | Admitting: Gastroenterology

## 2014-02-07 DIAGNOSIS — N182 Chronic kidney disease, stage 2 (mild): Secondary | ICD-10-CM | POA: Diagnosis present

## 2014-02-07 DIAGNOSIS — S0003XA Contusion of scalp, initial encounter: Secondary | ICD-10-CM | POA: Diagnosis present

## 2014-02-07 DIAGNOSIS — J4489 Other specified chronic obstructive pulmonary disease: Secondary | ICD-10-CM | POA: Diagnosis not present

## 2014-02-07 DIAGNOSIS — K922 Gastrointestinal hemorrhage, unspecified: Secondary | ICD-10-CM | POA: Diagnosis not present

## 2014-02-07 DIAGNOSIS — R748 Abnormal levels of other serum enzymes: Secondary | ICD-10-CM | POA: Diagnosis not present

## 2014-02-07 DIAGNOSIS — I82619 Acute embolism and thrombosis of superficial veins of unspecified upper extremity: Secondary | ICD-10-CM | POA: Diagnosis not present

## 2014-02-07 DIAGNOSIS — J449 Chronic obstructive pulmonary disease, unspecified: Secondary | ICD-10-CM | POA: Diagnosis not present

## 2014-02-07 DIAGNOSIS — E785 Hyperlipidemia, unspecified: Secondary | ICD-10-CM | POA: Diagnosis not present

## 2014-02-07 DIAGNOSIS — I472 Ventricular tachycardia: Secondary | ICD-10-CM | POA: Diagnosis not present

## 2014-02-07 DIAGNOSIS — I214 Non-ST elevation (NSTEMI) myocardial infarction: Secondary | ICD-10-CM | POA: Diagnosis not present

## 2014-02-07 DIAGNOSIS — Z87891 Personal history of nicotine dependence: Secondary | ICD-10-CM | POA: Diagnosis not present

## 2014-02-07 DIAGNOSIS — R55 Syncope and collapse: Secondary | ICD-10-CM | POA: Diagnosis not present

## 2014-02-07 DIAGNOSIS — R578 Other shock: Secondary | ICD-10-CM | POA: Diagnosis not present

## 2014-02-07 DIAGNOSIS — K226 Gastro-esophageal laceration-hemorrhage syndrome: Secondary | ICD-10-CM | POA: Diagnosis not present

## 2014-02-07 DIAGNOSIS — Z7982 Long term (current) use of aspirin: Secondary | ICD-10-CM | POA: Diagnosis not present

## 2014-02-07 DIAGNOSIS — Z86718 Personal history of other venous thrombosis and embolism: Secondary | ICD-10-CM | POA: Diagnosis not present

## 2014-02-07 DIAGNOSIS — I129 Hypertensive chronic kidney disease with stage 1 through stage 4 chronic kidney disease, or unspecified chronic kidney disease: Secondary | ICD-10-CM | POA: Diagnosis present

## 2014-02-07 DIAGNOSIS — K264 Chronic or unspecified duodenal ulcer with hemorrhage: Secondary | ICD-10-CM | POA: Diagnosis not present

## 2014-02-07 DIAGNOSIS — D649 Anemia, unspecified: Secondary | ICD-10-CM | POA: Diagnosis present

## 2014-02-07 DIAGNOSIS — Z86711 Personal history of pulmonary embolism: Secondary | ICD-10-CM | POA: Diagnosis not present

## 2014-02-07 DIAGNOSIS — Z87442 Personal history of urinary calculi: Secondary | ICD-10-CM | POA: Diagnosis not present

## 2014-02-07 DIAGNOSIS — I498 Other specified cardiac arrhythmias: Secondary | ICD-10-CM | POA: Diagnosis not present

## 2014-02-07 DIAGNOSIS — I2510 Atherosclerotic heart disease of native coronary artery without angina pectoris: Secondary | ICD-10-CM | POA: Diagnosis not present

## 2014-02-07 DIAGNOSIS — Z79899 Other long term (current) drug therapy: Secondary | ICD-10-CM | POA: Diagnosis not present

## 2014-02-07 DIAGNOSIS — R109 Unspecified abdominal pain: Secondary | ICD-10-CM | POA: Diagnosis not present

## 2014-02-07 DIAGNOSIS — Z8546 Personal history of malignant neoplasm of prostate: Secondary | ICD-10-CM | POA: Diagnosis not present

## 2014-02-07 DIAGNOSIS — K2940 Chronic atrophic gastritis without bleeding: Secondary | ICD-10-CM | POA: Diagnosis not present

## 2014-02-07 DIAGNOSIS — Z923 Personal history of irradiation: Secondary | ICD-10-CM | POA: Diagnosis not present

## 2014-02-07 DIAGNOSIS — K2990 Gastroduodenitis, unspecified, without bleeding: Secondary | ICD-10-CM | POA: Diagnosis not present

## 2014-02-07 DIAGNOSIS — K2100 Gastro-esophageal reflux disease with esophagitis, without bleeding: Secondary | ICD-10-CM | POA: Diagnosis not present

## 2014-02-07 DIAGNOSIS — N281 Cyst of kidney, acquired: Secondary | ICD-10-CM | POA: Diagnosis not present

## 2014-02-07 DIAGNOSIS — I10 Essential (primary) hypertension: Secondary | ICD-10-CM | POA: Diagnosis not present

## 2014-02-07 DIAGNOSIS — N189 Chronic kidney disease, unspecified: Secondary | ICD-10-CM | POA: Diagnosis present

## 2014-02-07 DIAGNOSIS — S01501A Unspecified open wound of lip, initial encounter: Secondary | ICD-10-CM | POA: Diagnosis not present

## 2014-02-07 DIAGNOSIS — K2970 Gastritis, unspecified, without bleeding: Secondary | ICD-10-CM | POA: Diagnosis not present

## 2014-02-07 DIAGNOSIS — K260 Acute duodenal ulcer with hemorrhage: Secondary | ICD-10-CM | POA: Diagnosis not present

## 2014-02-07 DIAGNOSIS — K269 Duodenal ulcer, unspecified as acute or chronic, without hemorrhage or perforation: Secondary | ICD-10-CM | POA: Diagnosis not present

## 2014-02-07 DIAGNOSIS — I2589 Other forms of chronic ischemic heart disease: Secondary | ICD-10-CM | POA: Diagnosis not present

## 2014-02-07 DIAGNOSIS — I4729 Other ventricular tachycardia: Secondary | ICD-10-CM | POA: Diagnosis not present

## 2014-02-07 DIAGNOSIS — G4733 Obstructive sleep apnea (adult) (pediatric): Secondary | ICD-10-CM | POA: Diagnosis not present

## 2014-02-07 DIAGNOSIS — Z7901 Long term (current) use of anticoagulants: Secondary | ICD-10-CM | POA: Diagnosis not present

## 2014-02-07 DIAGNOSIS — R0602 Shortness of breath: Secondary | ICD-10-CM | POA: Diagnosis not present

## 2014-02-07 DIAGNOSIS — E870 Hyperosmolality and hypernatremia: Secondary | ICD-10-CM | POA: Diagnosis not present

## 2014-02-07 DIAGNOSIS — R42 Dizziness and giddiness: Secondary | ICD-10-CM | POA: Diagnosis not present

## 2014-02-18 ENCOUNTER — Encounter (INDEPENDENT_AMBULATORY_CARE_PROVIDER_SITE_OTHER): Payer: Medicare Other | Admitting: General Surgery

## 2014-02-19 DIAGNOSIS — I2510 Atherosclerotic heart disease of native coronary artery without angina pectoris: Secondary | ICD-10-CM | POA: Diagnosis not present

## 2014-02-21 DIAGNOSIS — Z23 Encounter for immunization: Secondary | ICD-10-CM | POA: Diagnosis not present

## 2014-02-25 ENCOUNTER — Encounter (INDEPENDENT_AMBULATORY_CARE_PROVIDER_SITE_OTHER): Payer: Medicare Other | Admitting: General Surgery

## 2014-02-28 ENCOUNTER — Telehealth: Payer: Self-pay | Admitting: Internal Medicine

## 2014-02-28 DIAGNOSIS — T8189XA Other complications of procedures, not elsewhere classified, initial encounter: Secondary | ICD-10-CM | POA: Diagnosis not present

## 2014-02-28 NOTE — Telephone Encounter (Signed)
Called and spoke to pt's wife. Mrs. Jon White questioned if we have received any docs from pt's admission in [place]. I advised pt's spouse that we have not received any docs. Spouse stated she will have records faxed to our office. Appt made for 03/13/14. Mrs. Jon White verbalized understanding and denied any further questions or concerns at this time.

## 2014-03-04 ENCOUNTER — Telehealth: Payer: Self-pay | Admitting: Gastroenterology

## 2014-03-04 NOTE — Telephone Encounter (Signed)
Rec'd from [facility] forward 38 pages to [place]

## 2014-03-06 ENCOUNTER — Encounter: Payer: Self-pay | Admitting: Gastroenterology

## 2014-03-06 NOTE — Progress Notes (Signed)
Patient ID: Jon White, male   DOB: 1932/10/25, 80 y.o.   MRN: [id number]  Patient was hospitalized in [place], [place] on September 10 for a non-ST elevation MI.  Course was complicated by an acute GI bleed secondary to a distal duodenal ulcer.  This was cauterized.  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

## 2014-03-08 ENCOUNTER — Telehealth: Payer: Self-pay | Admitting: *Deleted

## 2014-03-08 NOTE — Telephone Encounter (Signed)
Message copied by Oda Kilts on Fri Mar 08, 2014  9:34 AM ------      Message from: Erskine Emery D      Created: Wed Mar 06, 2014 12:15 PM       Should have an elective office visit sometime in the next few weeks ------

## 2014-03-08 NOTE — Telephone Encounter (Signed)
Needs an ov  

## 2014-03-11 NOTE — Telephone Encounter (Signed)
Patient is scheduled on 05/01/2014 for an office visit

## 2014-03-13 ENCOUNTER — Ambulatory Visit (INDEPENDENT_AMBULATORY_CARE_PROVIDER_SITE_OTHER): Payer: Medicare Other | Admitting: Internal Medicine

## 2014-03-13 ENCOUNTER — Encounter: Payer: Self-pay | Admitting: Internal Medicine

## 2014-03-13 ENCOUNTER — Telehealth: Payer: Self-pay | Admitting: Internal Medicine

## 2014-03-13 VITALS — BP 112/80 | HR 47 | Ht 68.0 in | Wt 155.0 lb

## 2014-03-13 DIAGNOSIS — J449 Chronic obstructive pulmonary disease, unspecified: Secondary | ICD-10-CM

## 2014-03-13 DIAGNOSIS — I2699 Other pulmonary embolism without acute cor pulmonale: Secondary | ICD-10-CM | POA: Diagnosis not present

## 2014-03-13 NOTE — Telephone Encounter (Signed)
Jon White  This patient had syncope in [place] arund labor day weekend. Then dx with "MI" given heparin but had massive GIB and never had cath. He is asymptomatic currently. HAs appt with you later in November 2015. ANy chance you can see him earlier?   Thanks  Dr. Brand Males, M.D., [facility].C.P Pulmonary and Critical Care Medicine Staff Physician [place] [facility] Pager: [phone], If no answer or between  15:00h - 7:00h: call [phone]  03/13/2014 12:39 PM

## 2014-03-13 NOTE — Progress Notes (Signed)
Subjective:    Patient ID: Jon White, male    DOB: 09/20/1932, 80 y.o.   MRN: [id number]  HPI   isit Type:  Follow-up Copy to:  Dr. Emilee Hero Primary Provider/Referring Provider:  Dr Merrilee Seashore PMD, DR [facility] ENT, Dr. Deatra Ina GI, Dr Donneta Romberg - Allergy   reports that he quit smoking about 23 months ago. His smoking use included Cigarettes. He has a 60 pack-year smoking history. He has never used smokeless tobacco.   OV April 2011:  Fu copd/asthma: chronic cough and cough variant asthma.,  Last visits March and November 2010. Since then has seen Dr. Erik Obey who is contemplating sinus surgery if medical Rx fails. He has continued nasal steroid ciclesonide, daily saline wash via nettpot. However, he did see Dr. Donneta Romberg for allergy eval. Reportedly skin testing mildly positive for mold but otherwise negative. Was started on singulair. Afer that sinus drainage is "90% better". Able to cope with current levels of sinus drainage. Using tissue only 4-5 times per day as oppsed to >10-20 times per day. Re: GERD. He is not on PPI. Still having some cough and tickle in throat after he eats. Wants to get rid of it. Does not think this element of cough is related to sinus drainage. No other complaints. No dyspnea. Contnues with symbicort. Due to go back to [facility] for 5 months starting October 29, 2009  Problem # 1:  COUGH (ICD-786.2)   Etiologys a) elated to sinus drainage and mucus; b) Silent GERD  (he has hiatal hernia & esophagitis on ct chest 02/2008 + hx of hiccups + hx of intermittent dysphagia -.sp eso dilataition 07/2008; and c)  Cough variant asthma coexisting based on recurrence of chest tightness after dc of symbicort 07/2008  > On 09/10/2009: improved control after Dr. Donneta Romberg added singulair to regimen in Mid -feb 2011. Stil lhaving some cough that is likely due to GERD versus Habit cough. He is not on PPI and wonder why. He is not concerned about aspirin as cause and is continuing  it  plan Sinus per Dr Erik Obey nad Dr. Donneta Romberg GI per Dr Deatra Ina but will add PPI nexium Cough Variant asthma - contnue symbicort He will call in 1 month. If element of cough that is thought as due to GERD is betteer then we will continue PPI. Otherwise, will consider neurontin for habit cough Will get records from DR sharma     Patient Instructions: 1)  continue your medicines 2)  add nexium to your current regimen 3)  take it on empty stomach 4)  follow GERD diet advice 5)  Call me in 1 month to see if cough even better 6)  Depending on your call I will advise you next step   OV 05/18/2012  Not seen in > 2 years. He has been referred back for new problem of PE. IN November 18, 2011 one week after long car trip to [place] he was helping someone out with some heavy lifting at camp site and then became acutely unwell. Rushed to [facility] ER. Where per hx andreview of recoords: He was diagnosed with DVT left popliteal veint and RLL Sup Segment PE on CT June 201, 2013. Other tests showed: trop < 0.04, ddimer 5.6,  ECHO 11/19/11 - Normal LVEF. Mild TR, Mild PHA. Mild LVH. Rx as PE for 3-4 months with coumadin. Currently dyspneic ever since PE - simple activitis make him dyspneic. Gained 8#. Also 6 days in hospital. Walking desaturation  test on 05/18/2012 185 feet x 3 laps:  did NOT desaturate. Rest pulse ox was 96%, final pulse ox was 91%. HR response 71/min at rest to 89/min at peak exertion.   Of note, CT June 2013 in [facility] per their report (image n/a)  was significant for other findings that include: a) emphysema; b)   abnormal esophagus, hiatal hernia - rec endoscopy, and c) RML and R Anterior Base infiltrates - rad recommending followup   REC In order to sort out your shortness of breath have CT angio chest, echo and PFT  REturn for followup after above  Will refer you to coumadin clinic  OV 06/12/12 Presents with wife. Here to review test results. He is now back on coumadin with INR >  2. Still no change in dyspnea. This is the dyspnea that got worse after the PE diagnosis. Reports no new complaints but main issue is persistent dyspnea class 2-3 along with cough and a severe inability / struggle to bring out sputumwhich is scanty and yellopw. This has made voice hoarse past few weeks. Otherwise all same and he is frustrated. Reviewed meds: compliant with symbicort. He is not on spiriva; tried it bfore but quit due to urinary incontinence. He feels nebs would be better choice for him but willing to try tudorza    CT shows a) no clot but b) has small node in chest and emphysema - all old and unchnaged. However, has 2 mm stone in upper pole left kidney with minimal left renal collecting system dilatation noted; This is called hydronephrosis and he has contacted his urologist about it.   ECHO 1224/13  - PA pressure could not be estimated but essentially nromal Echo so PAP thought to be normal  PFT  06/12/12 PFT FVC fev1 ratio BD fev1 TLC DLCO coments  06/12/2012  2.7L/70% 1.1L/42% 39(67) 38% 6.8L/114% 9.7/56% Copd with asthma component. Huge BD response  07/10/2012   3.8 L/92%   1.97 L/64%   52      almost 1 L improvement since starting  tudorza    Past, Family, Social reviewed: no change since last visit  REC The reasoon you got more short of breath after blood clot treatment is not because of blood clot issues but becasuse asthma/copd is acting up significantly  Take doxycycline 100mg  po twice daily x 5 days; take after meals and avoid sunlight  Please take Take prednisone 40mg  once daily x 3 days, then 30mg  once daily x 3 days, then 20mg  once daily x 3 days, then prednisone 10mg  once daily x 3 days and stop  Continue symbicort 2 puff twice daily  Try tudorza 1 puff twice a day - learn technique. Monitor for urinary retention. IF this is a problem, call us immediately and stop the drug  Call me in 2 weeks to report response  I will see you in 4 weeks - 6 weeks to see progress   IF above strategy does not work , we can try BREO mdi or nebs  At followup do spirometry    OV 07/10/2012 - Jon White returns for followup. After startinng Jon White he feels 80% better. On his spirometry his FEV1 has improved almost a liter from 1.1 L to 1.97 L/64%. He continue Symbicort. He still has some residual mucus production that is blotchy postnasal drip and yellow color early in the morning. He uses his nasal steroids diligently but does not use nasal saline spray regularly. He never used Nettie pot or  hypertonic saline spray   - There no new issues and for the spring 2014 he plans to go to [place] as part of his annual spring to summer visit  PFT  06/12/12 PFT FVC fev1 ratio BD fev1 TLC DLCO coments  06/12/2012  2.7L/70% 1.1L/42% 39(67) 38% 6.8L/114% 9.7/56% Copd with asthma component. Huge BD response  07/10/2012   3.8 L/92%   1.97 L/64%   52      almost 1 L improvement since starting  tudorza    #COPD  Continue symbicort 2 puff twice daily  tudorza 1 puff twice a day  Use albuterol as needed  I recommend you take N acetylcysteine 600 mg twice a day.  - There is research from [place] published in [employer] that shows if you takes N-acetylcysteine cuts down acute exacerbation COPD by 20%. The drug functions by being an anti-oxidant. This drug will not make Jon White feel any better but will make the chances of this having an exacerbation less likely The drug is cheap and is over-the-counter at [facility] vitamin store or [employer]. There are no side effects. I have experience with with the drug with pulmonary fibrosis patients so I recommend it strongly  REturn in October 2014 or sooner if needed  Ensure fu in [place] during summer 2014  #Sinus draingae  - use netti pot or 3% neill-med saline spray daily at night for nose  - continue other measures advised by Dr Benjamine Mola  #Folllowup  - Oct 2014     OV 03/19/2013  COPD/asthma,, pulmonary embolism,  chronic rhinosinusitis followup  He is back from [place] for the winter. He has been doing well. He developed urinary retention again with the second anti-cholinergic inhaler namely tud orza. So he is discontinuing himself on Symbicort. No deterioration in respiratory symptoms. He's had a flu shot. Overall COPD stable and cat Score is 14  In terms of chronic rhinosinusitis this is unchanged. He is not fully compliant with his saline and nasal steroids.   Labs: for pulm embolism he continues on Coumadin. Last checked in June 2014 at our Coumadin clinic INR was therapeutic. He is been checking it in [place] and according to his history it has been therapeutic. He plans to re\re register with our Coumadin clinic     04/09/2013 Follow up for COPD  Patient returns for a followup for COPD. Last visit. Patient was changed from [place] to [place] due to urinary retention on Tudorza  Patient says that he has felt improved with decreased shortness of breath. No flare in cough or shortness of breath. Patient denies any chest pain, orthopnea, PND, or leg swelling.  Patient denies any urinary retention or urinary urgency.  spirometry today shows FEV1 at  1.7 liters, 56% , ratio 52%   OV 07/09/2013  Chief Complaint  Patient presents with  . COPD    follow-up. Pt denies any increase in SOB or cough at this time.     Followup COPD:: Feno at Dr Remus Blake office 05/15/13 was 47 and low prob for eos asthma. He did not tolerate  ANORO DUE to double vision and is back on [place]  and Symbicort. He feels this combination was really well for him. COPD cat score is currently 12 and his symptoms are detailed below  Pulmonary embolism: In June 2015 it'll be 2 years and she suffered pulmonary embolus and following road from [place] to [place]. He is been compliant with Coumadin.  Most recent INR 07/02/2013 is greater than 2. There no bleeding episodes. However he is frustrated taking anti-coagulation and  would like to come off. He is agreeable to taking Coumadin for INR greater than 1.5 starting June 2015 to continue to keep the risk of recurrent PE low  Chronic rhinosinusitis:: This is due to a polyp. He is followed by ENT Dr. Elgie Congo. He did have recent prednisone burst and this really helped. He says that his quality-of-life is mostly restricted because of rhinosinusitis. However he is at baseline it is not any worse  Imaging:  Last CT chest 05/19/12  - IMPRESSION:  No evidence of pulmonary embolism.  Changes of COPD and prior right lower lobe surgery.  Stable 4 mm left lower lobe nodule.  Question right renal cyst with 2 mm left kidney stone and minimal  left hydronephrosis.  Original Report Authenticated By: Lavonia Dana, M.D.    Jon White 10/23/2013  Chief Complaint  Patient presents with  . Follow-up    Pt c/o worsening SOB since last OV. C/o mild CP with deep breaths. c/o cough producing white and yellow mucous.     Followup COPD with BD response (low Feno at Dr Remus Blake office 05/15/13). He is  on [place]  and Symbicort. He feels this combination was really well for him. COPD CAT Score worse to 27 compared to baseline but this is because of ICU Stay due to duodenal ulcer perf in March/April 2015.   Pulmonary embolism: In June 2015 it'll be 2 years and she suffered pulmonary embolus and following road from [place] to [place]. He is been compliant with Coumadin. Most recent INR 07/02/2013 is greater than 2 on 10/09/13 . There no bleeding episodes but in march/april 2015 he suffered life threatening duodenal ulcer peforation. So though he is back on coumadin, he would prefer to come off. We agreed to check d-dimer: if normal will dc coumadin. If high, consider  taking Coumadin for INR greater than 1.5 starting June 2015 to continue to keep the risk of recurrent PE low  Chronic rhinosinusitis:: This is due to a polyp. He is followed by ENT Dr. Elgie Congo. Due to see him today   New issue is  Fatigue: This is since hospital dc ffom duodenal ulcer perf. He has never had TSH and Vit D check based on EPIC review.  Iam unable to check Vit D due to "medicare limit:. He is willing to attend rehab for copd. He has finished home PT, OT, RN   #COPD  - Continuye your medications - refer pulmonary rehab at [facility]  #Fatigue  - due to post ICU deconditioning  - check VIt D and TSH levels  - rehab for copd will help you  #PE treatment since June 2013 - CT Dec 2013 and March 2015 without evidence of clot  - check d-dimer levels; will call with results. IF normal, can stop coumadin.. - - If high, continue coumadin but will cut INR goal down to > 1.5  #Sinus   - folloow with Dr Benjamine Mola  #Lung cancer screen  - no evidence of lung cancer as of march 2015 - consider CT chest summer 2016; will decide at followup  #FOllowup  2 months    OV 12/11/2013  Chief Complaint  Patient presents with  . Follow-up    Pt states the symbicort isn't helping like it use to. C/o DOE, cough with intermittent clear, yellow and white mucous production. Pt states he had chest soreness  d/t coughing that lasted several days.    Followup  - COPD: he is on tudorza and symbicort. He finishes physical therapy after  ICU stay and he said that he was doing well after that but since they stopped his dyspnea is worse. His sputum volume appears more than usual by mild margin but the sputum color is good and white  and unchanged. There is no worsening cough. He is not so sure that isn't a COPD exacerbation but he feels that his Symbicort is not helping him anymore   - Pulmnaryh embolism: His INR goal is now greater than 1.5. We checked a d-dimer in June 2015 and it was still high. Which is completed over 2 years of treatment so we reduced the INR goal greater than 1.5 but we opted to continue Coumadin   - New issue: He said his abdomen was hurting him I examined this he has new onset cellulitis around the  abdominal incision wound along with the onset post drainage. He will see his surgeon today at 1:15 PM and I made sure that he got this acute appointment   01/09/2014 Follow up COPD  Returns for COPD follow up  Last ov was changed to [place] from symbicort.  Says he is feeling so much better. Feels that BREO is working very well.   Reports breathing is 75% improved since last ov.  DOE is near back to baseline since beginning the [place] Also had abd cellulitis , tx w/ abx -he says that has cleared up.  Remains on coumadin with INR goal 1.5-2.0 , doing well w/ no known bleeding.  No chest pain,orthopnea, edema or fever.     OV 03/13/2014  Chief Complaint  Patient presents with  . Follow-up    Pt was recently admitted to hospital in [place] for perf gastric ulcers and MI. Pt states he is doing better. Pt c/o DOE, mild dry cough. Pt denies CP/tightness.     Followup COPD and pulmonary embolism   The COPD stable. Since I last saw him in July 2015 he did see my nurse practitioner in August 2015. Then just prior to Labor Day Monday he went to [place] to be with his family. Thursday before  Labor Day Monday he had a sudden syncopal episode at home. Taken to [facility] where he was diagnosed with non-STEMI and was placed on IV heparin by the following day and 2 days later had significant upper GI bleed that required resuscitation and with blood and emergent endoscopy which apparently showed multiple gastric bleeding ulcers. Cardiac cath was therefore canceled. He spent 9 days in the hospital and released. During this time he was not on the ventilator did not suffer renal injury did not have pneumonia. Since then he has not had any chest pain. COPD continues to be stable. During his hospitalization he was tried on different inhalers but apparently these produced allergies or hives and these allergies are documented. Currently stable. He is up-to-date with his flu shot. He has a cardiology appointment  pending around Thanksgiving 2015 with Dr. Lauree Chandler  Of note due to the second GI bleed his anticoagulations has been discontinued even though he was taking Coumadin only for an INR goal greater than 1.5 based on a positive d-dimer predicting risk of future clot recurrence   Review of Systems  Constitutional: Negative for fever and unexpected weight change.  HENT: Negative for congestion, dental problem, ear pain, nosebleeds, postnasal drip, rhinorrhea, sinus pressure, sneezing,  sore throat and trouble swallowing.   Eyes: Negative for redness and itching.  Respiratory: Positive for cough and shortness of breath. Negative for chest tightness and wheezing.   Cardiovascular: Negative for palpitations and leg swelling.  Gastrointestinal: Negative for nausea and vomiting.  Genitourinary: Negative for dysuria.  Musculoskeletal: Negative for joint swelling.  Skin: Negative for rash.  Neurological: Negative for headaches.  Hematological: Does not bruise/bleed easily.  Psychiatric/Behavioral: Negative for dysphoric mood. The patient is not nervous/anxious.        Objective:   Physical Exam  Filed Vitals:   03/13/14 1149 03/13/14 1150  BP:  112/80  Pulse:  47  Height:  5\' 8"  (1.727 m)  Weight:  155 lb (70.308 kg)  SpO2: 84% 93%     General:  well developed, well nourished, in no acute distress Head:  normocephalic and atraumatic Eyes:  PERRLA/EOM intact; conjunctiva and sclera clear Ears:  TMs intact and clear with normal canals Nose:  no deformity, discharge, inflammation, or lesions Mouth:  dentures Melampatti Class II.   Neck:  no masses, thyromegaly, or abnormal cervical nodes Chest Wall:  no deformities noted Lungs: No wheezes Heard:  regular rate and rhythm, S1, S2 without murmurs, rubs, gallops, or clicks Abdomen:well healed surgical scar, mild residual scab, no redness  Msk:  no deformity or scoliosis noted with normal posture Pulses:  pulses  normal Extremities:  no clubbing, cyanosis, edema, or deformity noted Neurologic:  CN II-XII grossly intact with normal reflexes, coordination, muscle strength and tone Skin:  intact without lesions or rashes Cervical Nodes:  no significant adenopathy Axillary Nodes:  no significant adenopathy Psych:  alert and cooperative; normal mood and affect; normal attention span and concentration      Assessment & Plan:  COPD -glad is stable  - glad you are uptodate with vaccination - continue symbicort daily 2 puff twice daily  - allergies to different inhalers noted   #PE treatment since June 2013  - given Gi bleed history x 2, no more coumadin for atleast several months to a year  even though d-dimer predicts risk for clot recurrence - in future at some point, can check d-dimer  #Sinus   - folloow with Dr Benjamine Mola  #recent heart attack  - keep new appt with Dr Angelena Form; I will see if he can see you sooner   #Followup  - 4 months with me or NP Tammy   Call or return sooner if there are problems   Dr. Brand Males, M.D., [facility].C.P Pulmonary and Critical Care Medicine Staff Physician [facility] Pager: [phone], If no answer or between  15:00h - 7:00h: call [phone]  03/13/2014 12:35 PM

## 2014-03-13 NOTE — Patient Instructions (Signed)
#  COPD -glad is stable  - glad you are uptodate with vaccination - continue symbicort daily 2 puff twice daily  - allergies to different inhalers noted   #PE treatment since June 2013  - given Gi bleed history x 2, no more coumadin for atleast several months to a year  even though d-dimer predicts risk for clot recurrence - in future at some point, can check d-dimer  #Sinus   - folloow with Dr Benjamine Mola  #recent heart attack  - keep new appt with Dr Angelena Form; I will see if he can see you sooner   #Followup  - 4 months with me or NP Tammy   Call or return sooner if there are problems

## 2014-03-15 NOTE — Telephone Encounter (Signed)
Spoke with pt and appt moved to March 25, 2014 at 2:00.

## 2014-03-15 NOTE — Telephone Encounter (Signed)
Jon White, This pt will be new to me. Can you see if we can move his appt up to 10/26 or 10/28 to see me at 2pm either day? Thanks, chris

## 2014-03-18 DIAGNOSIS — J330 Polyp of nasal cavity: Secondary | ICD-10-CM | POA: Diagnosis not present

## 2014-03-25 ENCOUNTER — Encounter: Payer: Self-pay | Admitting: Cardiovascular Disease

## 2014-03-25 ENCOUNTER — Ambulatory Visit (INDEPENDENT_AMBULATORY_CARE_PROVIDER_SITE_OTHER): Payer: Medicare Other | Admitting: Cardiovascular Disease

## 2014-03-25 ENCOUNTER — Encounter: Payer: Self-pay | Admitting: *Deleted

## 2014-03-25 ENCOUNTER — Encounter (HOSPITAL_COMMUNITY): Payer: Self-pay | Admitting: Pharmacy Technician

## 2014-03-25 VITALS — BP 110/71 | HR 54 | Ht 68.0 in | Wt 154.1 lb

## 2014-03-25 DIAGNOSIS — E785 Hyperlipidemia, unspecified: Secondary | ICD-10-CM | POA: Diagnosis not present

## 2014-03-25 DIAGNOSIS — J9600 Acute respiratory failure, unspecified whether with hypoxia or hypercapnia: Secondary | ICD-10-CM | POA: Diagnosis not present

## 2014-03-25 DIAGNOSIS — I214 Non-ST elevation (NSTEMI) myocardial infarction: Secondary | ICD-10-CM

## 2014-03-25 LAB — BASIC METABOLIC PANEL
BUN: 20 mg/dL (ref 6–23)
CO2: 29 meq/L (ref 19–32)
Calcium: 9 mg/dL (ref 8.4–10.5)
Chloride: 103 mEq/L (ref 96–112)
Creatinine, Ser: 1.3 mg/dL (ref 0.4–1.5)
GFR: 58.66 mL/min — AB (ref >60.00)
GLUCOSE: 99 mg/dL (ref 70–99)
POTASSIUM: 4.3 meq/L (ref 3.5–5.1)
SODIUM: 137 meq/L (ref 135–145)

## 2014-03-25 LAB — CBC
HCT: 35.1 % — ABNORMAL LOW (ref 39.0–52.0)
HEMOGLOBIN: 11.1 g/dL — AB (ref 13.0–17.0)
MCHC: 31.7 g/dL (ref 30.0–36.0)
MCV: 85.6 fl (ref 78.0–100.0)
Platelets: 348 10*3/uL (ref 150.0–400.0)
RBC: 4.1 Mil/uL — ABNORMAL LOW (ref 4.22–5.81)
RDW: 17 % — ABNORMAL HIGH (ref 11.5–15.5)
WBC: 9.4 10*3/uL (ref 4.0–10.5)

## 2014-03-25 LAB — PROTIME-INR
INR: 1.1 ratio — ABNORMAL HIGH (ref 0.8–1.0)
Prothrombin Time: 12 s (ref 9.6–13.1)

## 2014-03-25 MED ORDER — ASPIRIN EC 81 MG PO TBEC: 81.0000 mg | DELAYED_RELEASE_TABLET | Freq: Every day | ORAL | Status: DC

## 2014-03-25 NOTE — Progress Notes (Signed)
History of Present Illness: 80 yo male with history of PE/DVT on chronic coumadin therapy, COPD, prostate cancer, OSA, HLD, chronic kidney disease who is here today as a new patient for cardiac evaluation. He was in [place] September 2015 and had syncopal episode. He felt dizzy before the episode. No palpitations. He was taken to a [facility] where he was diagnosed with non-STEMI, coumadin was held and he was placed on IV heparin. 2 days later had significant upper GI bleed that required resuscitation with blood and emergent endoscopy which showed multiple bleeding gastric ulcers. Cardiac cath was canceled due to the GI bleeding. He also had runs of non-sustained VT and was placed on amiodarone but this was changed to metoprolol before discharge. He spent 9 days in the hospital and released on 02/15/14. Since then he has not had any chest pain. Echo 02/08/14 from [facility] with LVEF=40-45%.   Primary Care Physician: Rogelia Mire  Past Medical History  Diagnosis Date  . Prostate cancer 1992    in remission.  s/p XRT  . Emphysema   . Allergic rhinitis   . Asthma   . COPD (chronic obstructive pulmonary disease)   . Recurrent upper respiratory infection (URI)     coughing from chronic sinusitis   . Normal cardiac stress test     reports stress test was several yrs. ago /w [employer]. Dr. Rogelia Mire, WNL   . OSA (obstructive sleep apnea) 2007    CPAP- report of settings on paper chart, seen by [facility] - seen 07/2011   . Renal stone     some passed spontaneous, & also had cystoscopy  . Arthritis     hands  . Neuromuscular disorder     reports shaking, somedays "can't hold spoon" - generalized  shakiness, is going to talk to MD about it at next appt.   . High cholesterol   . Shortness of breath on exertion   . H/O hiatal hernia   . Osteoarthritis, generalized   . Chronic renal insufficiency, stage III (moderate)   . Mixed hyperlipidemia   . PE (pulmonary embolism) 10/2011    Past  Surgical History  Procedure Laterality Date  . Lung surgery  1986    presumed from RLL. Segmentectomy. Benign Nodule.   . Sinus endo w/fusion  sch. 09/15/2011    Procedure: ENDOSCOPIC SINUS SURGERY WITH FUSION NAVIGATION;  Surgeon: Ascencion Dike, MD;  Location: [place];  Service: ENT;  Laterality: Bilateral;  bilateral maxillary antrostomy, bilateral endoscopic ethmoidectomy, bilateral sphenoidectomy, bilateral frontal recess exploration  . Septoplasty  09/15/11  . Cataract extraction w/ intraocular lens  implant, bilateral  ~ 2000  . Ureteroscopy  1980's  . Sinus endo w/fusion  09/15/2011    Procedure: ENDOSCOPIC SINUS SURGERY WITH FUSION NAVIGATION;  Surgeon: Ascencion Dike, MD;  Location: [place];  Service: ENT;  Laterality: Bilateral;  . Maxillary antrostomy  09/15/2011    Procedure: MAXILLARY ANTROSTOMY;  Surgeon: Ascencion Dike, MD;  Location: [facility] OR;  Service: ENT;  Laterality: Bilateral;  TOTAL MAXILLARY ANTROSTOMY  . Sinus exploration  09/15/2011    Procedure: SINUS EXPLORATION;  Surgeon: Ascencion Dike, MD;  Location: [facility] OR;  Service: ENT;  Laterality: Bilateral;  FRONTAL RECESS EXPLORATION  . Sphenoidectomy  09/15/2011    Procedure: Coralee Pesa;  Surgeon: Ascencion Dike, MD;  Location: [place];  Service: ENT;  Laterality: Bilateral;  . Septoplasty  09/15/2011    Procedure: SEPTOPLASTY;  Surgeon:  Ascencion Dike, MD;  Location: [place];  Service: ENT;  Laterality: Bilateral;  . Laparotomy N/A 08/28/2013    Procedure: EXPLORATORY LAPAROTOMY;  Surgeon: Rolm Bookbinder, MD;  Location: [place];  Service: General;  Laterality: N/A;  . Repair of perforated ulcer N/A 08/28/2013    Procedure: DUODENAL ULCER PATCH;  Surgeon: Rolm Bookbinder, MD;  Location: [place];  Service: General;  Laterality: N/A;  . Gastrostomy N/A 08/28/2013    Procedure: GASTROSTOMY;  Surgeon: Rolm Bookbinder, MD;  Location: [place];  Service: General;  Laterality: N/A;  . Esophagogastroduodenoscopy N/A 08/28/2013    Procedure:  ESOPHAGOGASTRODUODENOSCOPY (EGD);  Surgeon: Gayland Curry, MD;  Location: [employer];  Service: General;  Laterality: N/A;  . Melanoma excision Left     under left eye    Current Outpatient Prescriptions  Medication Sig Dispense Refill  . Aclidinium Bromide (TUDORZA PRESSAIR) 400 MCG/ACT AEPB Inhale 1 puff into the lungs 2 (two) times daily.  1 each  5  . albuterol (VENTOLIN HFA) 108 (90 BASE) MCG/ACT inhaler Inhale 2 puffs into the lungs every 6 (six) hours as needed for wheezing or shortness of breath.  1 Inhaler  2  . atorvastatin (LIPITOR) 10 MG tablet Take 10 mg by mouth daily.       . budesonide-formoterol (SYMBICORT) 160-4.5 MCG/ACT inhaler Inhale 2 puffs into the lungs 2 (two) times daily.      [name] Kitchen esomeprazole (NEXIUM) 40 MG capsule Take 40 mg by mouth 2 (two) times daily before a meal.      . fluocinonide cream (LIDEX) 9.39 % Apply 1 application topically daily. To infected area.      . fluticasone (FLONASE) 50 MCG/ACT nasal spray Place 2 sprays into the nose daily. Each nostril once a day.      . Fluticasone Furoate-Vilanterol (BREO ELLIPTA) 100-25 MCG/INH AEPB Inhale 1 puff into the lungs daily.  1 each  5  . ketoconazole (NIZORAL) 2 % shampoo Apply 1 application topically 2 (two) times a week.       [name] Kitchen lisinopril (PRINIVIL,ZESTRIL) 2.5 MG tablet Take 2.5 mg by mouth daily.      [name] Kitchen loratadine (CLARITIN) 10 MG tablet Take 10 mg by mouth daily.      . metoprolol succinate (TOPROL-XL) 25 MG 24 hr tablet Take 12.5 mg by mouth daily.      . metoprolol tartrate (LOPRESSOR) 25 MG tablet Take 25 mg by mouth daily.      . Multiple Vitamin (MULTIVITAMIN WITH MINERALS) TABS tablet Take 1 tablet by mouth daily.      . nitroGLYCERIN (NITROSTAT) 0.4 MG SL tablet Place 0.4 mg under the tongue every 5 (five) minutes as needed for chest pain.      . phenol (CHLORASEPTIC) 1.4 % LIQD Use as directed 1 spray in the mouth or throat as needed for throat irritation / pain.       No current facility-administered  medications for this visit.    Allergies  Allergen Reactions  . Mometasone Furoate Other (See Comments)    CAUSED RED,ITCHY EYES; "lost my eyelashes" and Omnaris--nose bleed.  . Naproxen Sodium Nausea And Vomiting    "thought I was going to die"  . Anoro Ellipta [Umeclidinium-Vilanterol]     Double vision  . Breo Ellipta [Fluticasone Furoate-Vilanterol]   . Spiriva Handihaler [Tiotropium Bromide Monohydrate]     History   Social History  . Marital Status: Married    Spouse Name: N/A    Number of Children: 4  .  Years of Education: N/A   Occupational History  . 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     Social History Main Topics  . Smoking status: Former Smoker -- 1.00 packs/day for 60 years    Types: Cigarettes    Quit date: 08/06/2011  . Smokeless tobacco: Never Used  . Alcohol Use: 0.0 oz/week     Comment: rarely  . Drug Use: No  . Sexual Activity: No   Other Topics Concern  . Not on file   Social History Narrative   Traveled to [place].   Married and lives with wife.   Originally from [place].   Spends winters in [place]   Has 2 kids in [place]          Family History  Problem Relation Age of Onset  . Prostate cancer Brother   . Prostate cancer Brother   . Prostate cancer Brother   . Asthma Father   . Heart disease Mother     MI age 37  . Anesthesia problems Neg Hx     Review of Systems:  As stated in the HPI and otherwise negative.   BP 110/71  Pulse 54  Ht 5\' 8"  (1.727 m)  Wt 154 lb 1.9 oz (69.908 kg)  BMI 23.44 kg/m2  Physical Examination: General: Well developed, well nourished, NAD HEENT: OP clear, mucus membranes moist SKIN: warm, dry. No rashes. Neuro: No focal deficits Musculoskeletal: Muscle strength 5/5 all ext Psychiatric: Mood and affect normal Neck: No JVD, no carotid bruits, no thyromegaly, no lymphadenopathy. Lungs:Clear bilaterally, no wheezes, rhonci, crackles Cardiovascular: Regular rate and rhythm. No murmurs, gallops or  rubs. Abdomen:Soft. Bowel sounds present. Non-tender.  Extremities: No lower extremity edema. Pulses are 2 + in the bilateral DP/PT.  EKG: Sinus brady, rate 54 bpm. Septal infarct.   Assessment and Plan:   1. NSTEMI: Recent NSTEMI but cath delayed due to GI bleeding, gastric ulcers. NO bleeding GI system last month. Will arrange cardiac cath 04/04/14 at 9am. Risks and benefits reviewed with pt. Pre-cath labs today. If he tolerates ASA and needs stent, would use bare metal with ASA/Plavix. Continue statin and beta blocker. Records reviewed from [facility] in [place].   2. HLD: continue statin.

## 2014-03-25 NOTE — Patient Instructions (Signed)
Your physician recommends that you schedule a follow-up appointment in:  About 5 weeks. Scheduled for May 03, 2014 at 11:00  Your physician has recommended you make the following change in your medication:  Start Enteric Coated aspirin 81 mg by mouth daily.     Your physician has requested that you have a cardiac catheterization. Cardiac catheterization is used to diagnose and/or treat various heart conditions. Doctors may recommend this procedure for a number of different reasons. The most common reason is to evaluate chest pain. Chest pain can be a symptom of coronary artery disease (CAD), and cardiac catheterization can show whether plaque is narrowing or blocking your heart's arteries. This procedure is also used to evaluate the valves, as well as measure the blood flow and oxygen levels in different parts of your heart. For further information please visit [email or web address]. Please follow instruction sheet, as given. Scheduled for April 04, 2014  Coronary Angiogram A coronary angiogram, also called coronary angiography, is an X-ray procedure used to look at the arteries in the heart. In this procedure, a dye (contrast dye) is injected through a long, hollow tube (catheter). The catheter is about the size of a piece of cooked spaghetti and is inserted through your groin, wrist, or arm. The dye is injected into each artery, and X-rays are then taken to show if there is a blockage in the arteries of your heart. LET [facility] CARE PROVIDER KNOW ABOUT:  Any allergies you have, including allergies to shellfish or contrast dye.   All medicines you are taking, including vitamins, herbs, eye drops, creams, and over-the-counter medicines.   Previous problems you or members of your family have had with the use of anesthetics.   Any blood disorders you have.   Previous surgeries you have had.  History of kidney problems or failure.   Other medical conditions you have. RISKS AND  COMPLICATIONS  Generally, a coronary angiogram is a safe procedure. However, problems can occur and include:  Allergic reaction to the dye.  Bleeding from the access site or other locations.  Kidney injury, especially in people with impaired kidney function.  Stroke (rare).  Heart attack (rare). BEFORE THE PROCEDURE   Do not eat or drink anything after midnight the night before the procedure or as directed by your health care provider.   Ask your health care provider about changing or stopping your regular medicines. This is especially important if you are taking diabetes medicines or blood thinners. PROCEDURE  You may be given a medicine to help you relax (sedative) before the procedure. This medicine is given through an intravenous (IV) access tube that is inserted into one of your veins.   The area where the catheter will be inserted will be washed and shaved. This is usually done in the groin but may be done in the fold of your arm (near your elbow) or in the wrist.   A medicine will be given to numb the area where the catheter will be inserted (local anesthetic).   The health care provider will insert the catheter into an artery. The catheter will be guided by using a special type of X-ray (fluoroscopy) of the blood vessel being examined.   A special dye will then be injected into the catheter, and X-rays will be taken. The dye will help to show where any narrowing or blockages are located in the heart arteries.  AFTER THE PROCEDURE   If the procedure is done through the leg, you will be kept  in bed lying flat for several hours. You will be instructed to not bend or cross your legs.  The insertion site will be checked frequently.   The pulse in your feet or wrist will be checked frequently.   Additional blood tests, X-rays, and an electrocardiogram may be done.  Document Released: 11/21/2002 Document Revised: 10/01/2013 Document Reviewed: 10/09/2012 [facility]  Patient Information 2015 [place], [place]. This information is not intended to replace advice given to you by your health care provider. Make sure you discuss any questions you have with your health care provider.

## 2014-03-27 ENCOUNTER — Telehealth: Payer: Self-pay | Admitting: Cardiovascular Disease

## 2014-03-27 NOTE — Telephone Encounter (Signed)
Spoke with pt and made him aware of appt on October 30,2015. He will be there for appt.  He will discuss at appt if GI work up needed prior to cath. Pt will let us know if he wants to reschedule cath.

## 2014-03-27 NOTE — Telephone Encounter (Signed)
Patient is returning your call, please call back he has more questions.

## 2014-03-27 NOTE — Telephone Encounter (Signed)
Agree. cdm 

## 2014-03-27 NOTE — Telephone Encounter (Signed)
New problem   Pt need to speak to nurse concerning some changes that need to be made b/c his cath. Please call pt.

## 2014-03-27 NOTE — Telephone Encounter (Signed)
Spoke with pt who states he received voicemail 20 minutes ago to contact our office. I do not see that call was placed to him. He is aware of appt with Dr. Deatra Ina on March 29, 2014. Pt has no additional questions at this time.

## 2014-03-27 NOTE — Telephone Encounter (Signed)
Spoke with pt. He reports he started ASA 81 mg daily and within 3 hours he became sweaty and then developed chills. He also broke out in hives on chest, arm and back.  Had nausea and vomiting also.  He has not taken any more ASA. He reports hives are improving.  Will add ASA to allergies. He will stay off ASA.   Pt reports his daughter is a respiratory therapist and feels pt needs endoscopy or colonoscopy to make sure everything is OK prior to cath.  Pt has appt with Dr. Deatra Ina in December and is asking if I can contact them for earlier appt.  He would like to cancel cath if not able to see GI prior to cath.  Will contact GI. I then spoke with Sherri at [facility] and appt made for pt to see Dr. Deatra Ina on March 29, 2014 at 9:45.  I placed call to pt and left message to call back

## 2014-03-29 ENCOUNTER — Encounter: Payer: Self-pay | Admitting: Gastroenterology

## 2014-03-29 ENCOUNTER — Ambulatory Visit (INDEPENDENT_AMBULATORY_CARE_PROVIDER_SITE_OTHER): Payer: Medicare Other | Admitting: Gastroenterology

## 2014-03-29 VITALS — BP 126/60 | HR 68 | Ht 66.5 in | Wt 154.0 lb

## 2014-03-29 DIAGNOSIS — K260 Acute duodenal ulcer with hemorrhage: Secondary | ICD-10-CM | POA: Diagnosis not present

## 2014-03-29 DIAGNOSIS — Z1211 Encounter for screening for malignant neoplasm of colon: Secondary | ICD-10-CM

## 2014-03-29 DIAGNOSIS — J432 Centrilobular emphysema: Secondary | ICD-10-CM

## 2014-03-29 DIAGNOSIS — I2699 Other pulmonary embolism without acute cor pulmonale: Secondary | ICD-10-CM

## 2014-03-29 NOTE — Assessment & Plan Note (Signed)
She was treated endoscopically and Coumadin has been held.  Patient was carefully instructed to avoid NSAIDs permanently.  He'll require PPI maintenance therapy.  He was not taking any acid suppressant medications prior to this.  Per cardiology he will be scheduled for endoscopy prior to cardiac catheterization to document ulcer healing.

## 2014-03-29 NOTE — Progress Notes (Signed)
      History of Present Illness:  Jon White returned all in hospitalization in [place] for an acute GI bleed.  He had bleeding duodenal ulcers that was treated endoscopically.  He was taken off of Coumadin.  He was discharged on twice a day Nexium has had no further complications.  At this point he's feeling fairly well.  He was scheduled for endoscopy and colonoscopy but had this complication before the procedures were completed.  He is scheduled for cardiac catheterization but his cardiologist requested that he have an endoscopy prior to this procedure.    Review of Systems: Pertinent positive and negative review of systems were noted in the above HPI section. All other review of systems were otherwise negative.    Current Medications, Allergies, Past Medical History, Past Surgical History, Family History and Social History were reviewed in [facility] record  Vital signs were reviewed in today's medical record. Physical Exam: General: Well developed , well nourished, no acute distress Skin: anicteric Head: Normocephalic and atraumatic Eyes:  sclerae anicteric, EOMI Ears: Normal auditory acuity Mouth: No deformity or lesions Lungs: Clear throughout to auscultation Heart: Regular rate and rhythm; no murmurs, rubs or bruits Abdomen: Soft, non tender and non distended. No masses, hepatosplenomegaly or hernias noted. Normal Bowel sounds Rectal:deferred Musculoskeletal: Symmetrical with no gross deformities  Pulses:  Normal pulses noted Extremities: No clubbing, cyanosis, edema or deformities noted Neurological: Alert oriented x 4, grossly nonfocal Psychological:  Alert and cooperative. Normal mood and affect  See Assessment and Plan under Problem List

## 2014-03-29 NOTE — Patient Instructions (Signed)
Your Colonoscopy and Endoscopy has been scheduled at [facility] on 04/03/2014 at 2pm Separate instructions have been given to you Suprep bowel kit sample has been given to you

## 2014-03-29 NOTE — Assessment & Plan Note (Signed)
Screening colonoscopy at the same time of his upper endoscopy.

## 2014-04-01 ENCOUNTER — Telehealth: Payer: Self-pay | Admitting: Cardiovascular Disease

## 2014-04-01 NOTE — Telephone Encounter (Signed)
Spoke with pt. He is scheduled for endoscopy and colonoscopy on April 03, 2014.  Cath was scheduled for April 04, 2014 but this will be rescheduled. Pt will call us after GI procedure to reschedule cath.  Pt aware Dr. Angelena Form in cath lab on November 16 and 18. Pt aware he will need repeat lab work prior to cath

## 2014-04-01 NOTE — Telephone Encounter (Signed)
New message    Patient calling stating he would like to change his procedure date to another date . Will discuss when the nurse calls back.

## 2014-04-02 DIAGNOSIS — E782 Mixed hyperlipidemia: Secondary | ICD-10-CM | POA: Diagnosis not present

## 2014-04-02 DIAGNOSIS — R7301 Impaired fasting glucose: Secondary | ICD-10-CM | POA: Diagnosis not present

## 2014-04-03 ENCOUNTER — Ambulatory Visit (HOSPITAL_COMMUNITY)
Admission: RE | Admit: 2014-04-03 | Discharge: 2014-04-03 | Disposition: A | Discharge status: Home / Self Care | Payer: Medicare Other | Source: Ambulatory Visit | Attending: Gastroenterology | Admitting: Gastroenterology

## 2014-04-03 ENCOUNTER — Encounter (HOSPITAL_COMMUNITY)
Admission: RE | Disposition: A | Discharge status: Home / Self Care | Payer: Medicare Other | Source: Ambulatory Visit | Attending: Gastroenterology

## 2014-04-03 ENCOUNTER — Encounter (HOSPITAL_COMMUNITY): Payer: Self-pay | Admitting: Gastroenterology

## 2014-04-03 ENCOUNTER — Ambulatory Visit (HOSPITAL_COMMUNITY): Payer: Medicare Other

## 2014-04-03 DIAGNOSIS — J432 Centrilobular emphysema: Secondary | ICD-10-CM

## 2014-04-03 DIAGNOSIS — G7089 Other specified myoneural disorders: Secondary | ICD-10-CM | POA: Diagnosis not present

## 2014-04-03 DIAGNOSIS — I129 Hypertensive chronic kidney disease with stage 1 through stage 4 chronic kidney disease, or unspecified chronic kidney disease: Secondary | ICD-10-CM | POA: Insufficient documentation

## 2014-04-03 DIAGNOSIS — K648 Other hemorrhoids: Secondary | ICD-10-CM | POA: Diagnosis not present

## 2014-04-03 DIAGNOSIS — K260 Acute duodenal ulcer with hemorrhage: Secondary | ICD-10-CM | POA: Diagnosis not present

## 2014-04-03 DIAGNOSIS — Z888 Allergy status to other drugs, medicaments and biological substances status: Secondary | ICD-10-CM | POA: Insufficient documentation

## 2014-04-03 DIAGNOSIS — J45909 Unspecified asthma, uncomplicated: Secondary | ICD-10-CM | POA: Insufficient documentation

## 2014-04-03 DIAGNOSIS — Z1211 Encounter for screening for malignant neoplasm of colon: Secondary | ICD-10-CM

## 2014-04-03 DIAGNOSIS — K2980 Duodenitis without bleeding: Secondary | ICD-10-CM | POA: Diagnosis not present

## 2014-04-03 DIAGNOSIS — K315 Obstruction of duodenum: Secondary | ICD-10-CM

## 2014-04-03 DIAGNOSIS — G4733 Obstructive sleep apnea (adult) (pediatric): Secondary | ICD-10-CM | POA: Insufficient documentation

## 2014-04-03 DIAGNOSIS — J449 Chronic obstructive pulmonary disease, unspecified: Secondary | ICD-10-CM | POA: Insufficient documentation

## 2014-04-03 DIAGNOSIS — Z923 Personal history of irradiation: Secondary | ICD-10-CM | POA: Insufficient documentation

## 2014-04-03 DIAGNOSIS — Z86711 Personal history of pulmonary embolism: Secondary | ICD-10-CM | POA: Diagnosis not present

## 2014-04-03 DIAGNOSIS — K264 Chronic or unspecified duodenal ulcer with hemorrhage: Secondary | ICD-10-CM | POA: Diagnosis not present

## 2014-04-03 DIAGNOSIS — N183 Chronic kidney disease, stage 3 (moderate): Secondary | ICD-10-CM | POA: Insufficient documentation

## 2014-04-03 DIAGNOSIS — Z886 Allergy status to analgesic agent status: Secondary | ICD-10-CM | POA: Diagnosis not present

## 2014-04-03 DIAGNOSIS — Z9889 Other specified postprocedural states: Secondary | ICD-10-CM | POA: Diagnosis not present

## 2014-04-03 DIAGNOSIS — K5730 Diverticulosis of large intestine without perforation or abscess without bleeding: Secondary | ICD-10-CM | POA: Insufficient documentation

## 2014-04-03 DIAGNOSIS — K269 Duodenal ulcer, unspecified as acute or chronic, without hemorrhage or perforation: Secondary | ICD-10-CM

## 2014-04-03 DIAGNOSIS — Z8546 Personal history of malignant neoplasm of prostate: Secondary | ICD-10-CM | POA: Insufficient documentation

## 2014-04-03 DIAGNOSIS — I2699 Other pulmonary embolism without acute cor pulmonale: Secondary | ICD-10-CM

## 2014-04-03 DIAGNOSIS — M159 Polyosteoarthritis, unspecified: Secondary | ICD-10-CM | POA: Diagnosis not present

## 2014-04-03 DIAGNOSIS — R933 Abnormal findings on diagnostic imaging of other parts of digestive tract: Secondary | ICD-10-CM

## 2014-04-03 DIAGNOSIS — K3189 Other diseases of stomach and duodenum: Secondary | ICD-10-CM | POA: Insufficient documentation

## 2014-04-03 DIAGNOSIS — K222 Esophageal obstruction: Secondary | ICD-10-CM

## 2014-04-03 HISTORY — PX: COLONOSCOPY: SHX5424

## 2014-04-03 HISTORY — PX: ESOPHAGOGASTRODUODENOSCOPY: SHX5428

## 2014-04-03 SURGERY — EGD (ESOPHAGOGASTRODUODENOSCOPY)
Anesthesia: Moderate Sedation

## 2014-04-03 MED ORDER — MIDAZOLAM HCL 10 MG/2ML IJ SOLN
INTRAMUSCULAR | Status: AC
  Filled 2014-04-03: qty 4

## 2014-04-03 MED ORDER — DIPHENHYDRAMINE HCL 50 MG/ML IJ SOLN
INTRAMUSCULAR | Status: AC
  Filled 2014-04-03: qty 1

## 2014-04-03 MED ORDER — FENTANYL CITRATE 0.05 MG/ML IJ SOLN
INTRAMUSCULAR | Status: DC | PRN
  Administered 2014-04-03: 12.5 ug via INTRAVENOUS
  Administered 2014-04-03: 25 ug via INTRAVENOUS
  Administered 2014-04-03: 12.5 ug via INTRAVENOUS
  Administered 2014-04-03: 25 ug via INTRAVENOUS

## 2014-04-03 MED ORDER — FENTANYL CITRATE 0.05 MG/ML IJ SOLN
INTRAMUSCULAR | Status: AC
  Filled 2014-04-03: qty 2

## 2014-04-03 MED ORDER — BUTAMBEN-TETRACAINE-BENZOCAINE 2-2-14 % EX AERO
INHALATION_SPRAY | CUTANEOUS | Status: DC | PRN
  Administered 2014-04-03: 2 via TOPICAL

## 2014-04-03 MED ORDER — SODIUM CHLORIDE 0.9 % IV SOLN
INTRAVENOUS | Status: DC
  Administered 2014-04-03: 500 mL via INTRAVENOUS

## 2014-04-03 MED ORDER — MIDAZOLAM HCL 10 MG/2ML IJ SOLN
INTRAMUSCULAR | Status: DC | PRN
  Administered 2014-04-03: 1 mg via INTRAVENOUS
  Administered 2014-04-03: 2 mg via INTRAVENOUS
  Administered 2014-04-03: 1 mg via INTRAVENOUS
  Administered 2014-04-03: 2 mg via INTRAVENOUS
  Administered 2014-04-03: 1 mg via INTRAVENOUS

## 2014-04-03 NOTE — Op Note (Signed)
[facility] [place], [id number]   COLONOSCOPY PROCEDURE REPORT     EXAM DATE: 04/18/14  PATIENT NAME:      Jon White, Jon White           MR #:      [id number]  BIRTHDATE:       Mar 15, 1933      VISIT #:     [phone]  ATTENDING:     Inda Castle, MD     STATUS:     outpatient REFERRING MD:      Verl Bangs, M.D. ASA CLASS:        Class III  INDICATIONS:  The patient is a 80 yr old male here for a colonoscopy due to history of bleeding duodenal ulcer and average risk for colon cancer. PROCEDURE PERFORMED:     Colonoscopy, diagnostic MEDICATIONS:     Fentanyl 75 mcg IV and Versed 7 mg IV ESTIMATED BLOOD LOSS:     None  CONSENT: The patient understands the risks and benefits of the procedure and understands that these risks include, but are not limited to: sedation, allergic reaction, infection, perforation and/or bleeding. Alternative means of evaluation and treatment include, among others: physical exam, x-rays, and/or surgical intervention. The patient elects to proceed with this endoscopic procedure.  DESCRIPTION OF PROCEDURE: During intra-op preparation period all mechanical & medical equipment was checked for proper function. Hand hygiene and appropriate measures for infection prevention was taken. After the risks, benefits and alternatives of the procedure were thoroughly explained, Informed consent was verified, confirmed and timeout was successfully executed by the treatment team. A digital exam revealed no abnormalities of the rectum.      The Pentax Adult Colonscope [id number] endoscope was introduced through the anus and advanced to the sigmoid colon. No adverse events experienced. The prep was excellent using Suprep. The instrument was then slowly withdrawn as the colon was fully examined. Retroflexed views revealed no abnormalities. There was severe diverticular disease in the mid sigmoid beginning at  approximately 25 cm.  Multiple diverticula were present.  There was severe angulation with luminal narrowing, muscular hypertrophy and severe spasm.  Despite multiple attempts I was unable to pass the adult scope through this narrowed, angulated area.  Attempts were made with the pediatric colonoscope were also unsuccessful. The scope was then completely withdrawn from the patient and the procedure terminated. WITHDRAWAL TIME:    ADVERSE EVENTS:      There were no immediate complications.  IMPRESSIONS:     1.  There was severe diverticulosis noted in the sigmoid colon presenting passage of the scope beyond the mid sigmoid 2.  Internal hemorrhoids  RECOMMENDATIONS:     My    [employer]ema RECALL:  Inda Castle, MD eSigned:  Inda Castle, MD April 18, 2014 2:54 PM   cc:  CPT CODES: ICD CODES:  The ICD and CPT codes recommended by this software are interpretations from the data that the clinical staff has captured with the software.  The verification of the translation of this report to the ICD and CPT codes and modifiers is the sole responsibility of the health care institution and practicing physician where this report was generated.  [employer]. will not be held responsible for the validity of the ICD and CPT codes included on this report.  AMA assumes no liability for data contained or not contained herein. CPT is a Designer, television/film set of the [employer].

## 2014-04-03 NOTE — H&P (View-Only) (Signed)
      History of Present Illness:  Jon White returned all in hospitalization in [place] for an acute GI bleed.  He had bleeding duodenal ulcers that was treated endoscopically.  He was taken off of Coumadin.  He was discharged on twice a day Nexium has had no further complications.  At this point he's feeling fairly well.  He was scheduled for endoscopy and colonoscopy but had this complication before the procedures were completed.  He is scheduled for cardiac catheterization but his cardiologist requested that he have an endoscopy prior to this procedure.    Review of Systems: Pertinent positive and negative review of systems were noted in the above HPI section. All other review of systems were otherwise negative.    Current Medications, Allergies, Past Medical History, Past Surgical History, Family History and Social History were reviewed in [facility] record  Vital signs were reviewed in today's medical record. Physical Exam: General: Well developed , well nourished, no acute distress Skin: anicteric Head: Normocephalic and atraumatic Eyes:  sclerae anicteric, EOMI Ears: Normal auditory acuity Mouth: No deformity or lesions Lungs: Clear throughout to auscultation Heart: Regular rate and rhythm; no murmurs, rubs or bruits Abdomen: Soft, non tender and non distended. No masses, hepatosplenomegaly or hernias noted. Normal Bowel sounds Rectal:deferred Musculoskeletal: Symmetrical with no gross deformities  Pulses:  Normal pulses noted Extremities: No clubbing, cyanosis, edema or deformities noted Neurological: Alert oriented x 4, grossly nonfocal Psychological:  Alert and cooperative. Normal mood and affect  See Assessment and Plan under Problem List

## 2014-04-03 NOTE — Interval H&P Note (Signed)
History and Physical Interval Note:  04/03/2014 2:04 PM  Jon White  has presented today for surgery, with the diagnosis of gastric ulcers   GI BLeed  The various methods of treatment have been discussed with the patient and family. After consideration of risks, benefits and other options for treatment, the patient has consented to  Procedure(s): ESOPHAGOGASTRODUODENOSCOPY (EGD) (N/A) COLONOSCOPY (N/A) as a surgical intervention .  The patient's history has been reviewed, patient examined, no change in status, stable for surgery.  I have reviewed the patient's chart and labs.  Questions were answered to the patient's satisfaction.    The recent H&P (dated *03/29/14**) was reviewed, the patient was examined and there is no change in the patients condition since that H&P was completed.   Erskine Emery  04/03/2014, 2:04 PM    Erskine Emery

## 2014-04-03 NOTE — Op Note (Signed)
[facility] [place], [address]   ENDOSCOPY PROCEDURE REPORT  PATIENT: Jon White, Jon White  MR#: [id number] BIRTHDATE: [birth year] , 81  yrs. old GENDER: male ENDOSCOPIST: Inda Castle, MD REFERRED BY:  Verl Bangs, M.D. PROCEDURE DATE:  04/03/2014 PROCEDURE:  EGD w/ biopsy ASA CLASS:     Class III INDICATIONS:  surveillance.  history of bleeding duodenal ulcers MEDICATIONS: Residual sedation present TOPICAL ANESTHETIC: Cetacaine Spray  DESCRIPTION OF PROCEDURE: After the risks benefits and alternatives of the procedure were thoroughly explained, informed consent was obtained.  The [place] [id number] endoscope was introduced through the mouth and advanced to the bulb of duodenum , Limited by a stricture.  The instrument was slowly withdrawn as the mucosa was fully examined.    There was severe scarring of the duodenal bulb.  An active 5 mm ulcer was present with surrounding erythematous mucosa.  Biopsies were taken.  There was a stricture at the apex of the duodenal bulb.  This was not traversable with the 9 mm gastroscope.   Except for the findings listed the EGD was otherwise normal.   ESOPHAGUS: There was a short peptic stricture at the gastroesophageal junction.  The stricture was easily traversable.  Retroflexed views revealed no abnormalities.     The scope was then withdrawn from the patient and the procedure completed.  COMPLICATIONS: There were no immediate complications.  ENDOSCOPIC IMPRESSION: 1.   There was severe scarring of the duodenal bulb.  An active 5 mm ulcer was present with surrounding erythematous mucosa.  Biopsies were taken.  There was a stricture at the apex of the duodenal bulb.  This was easilty traversable with the 9 mm gastroscope 2.   EGD was otherwise normal 3.   There was a short stricture at the gastroesophageal junction  RECOMMENDATIONS: Continue twice a day PPI therapy  REPEAT  EXAM:  eSigned:  Inda Castle, MD 04/03/2014 3:01 PM    CC:

## 2014-04-03 NOTE — Discharge Instructions (Signed)
Colonoscopy, Care After °These instructions give you information on caring for yourself after your procedure. Your doctor may also give you more specific instructions. Call your doctor if you have any problems or questions after your procedure. °HOME CARE °· Do not drive for 24 hours. °· Do not sign important papers or use machinery for 24 hours. °· You may shower. °· You may go back to your usual activities, but go slower for the first 24 hours. °· Take rest breaks often during the first 24 hours. °· Walk around or use warm packs on your belly (abdomen) if you have belly cramping or gas. °· Drink enough fluids to keep your pee (urine) clear or pale yellow. °· Resume your normal diet. Avoid heavy or fried foods. °· Avoid drinking alcohol for 24 hours or as told by your doctor. °· Only take medicines as told by your doctor. °If a tissue sample (biopsy) was taken during the procedure:  °· Do not take aspirin or blood thinners for 7 days, or as told by your doctor. °· Do not drink alcohol for 7 days, or as told by your doctor. °· Eat soft foods for the first 24 hours. °GET HELP IF: °You still have a small amount of blood in your poop (stool) 2-3 days after the procedure. °GET HELP RIGHT AWAY IF: °· You have more than a small amount of blood in your poop. °· You see clumps of tissue (blood clots) in your poop. °· Your belly is puffy (swollen). °· You feel sick to your stomach (nauseous) or throw up (vomit). °· You have a fever. °· You have belly pain that gets worse and medicine does not help. °MAKE SURE YOU: °· Understand these instructions. °· Will watch your condition. °· Will get help right away if you are not doing well or get worse. °Document Released: 06/19/2010 Document Revised: 05/22/2013 Document Reviewed: 01/22/2013 °ExitCare® Patient Information ©2015 [employer]. This information is not intended to replace advice given to you by your health care provider. Make sure you discuss any questions you have with  your health care provider. °Esophagogastroduodenoscopy °Care After °Refer to this sheet in the next few weeks. These instructions provide you with information on caring for yourself after your procedure. Your caregiver may also give you more specific instructions. Your treatment has been planned according to current medical practices, but problems sometimes occur. Call your caregiver if you have any problems or questions after your procedure.  °HOME CARE INSTRUCTIONS °· Do not eat or drink anything until the numbing medicine (local anesthetic) has worn off and your gag reflex has returned. You will know that the local anesthetic has worn off when you can swallow comfortably. °· Do not drive for 12 hours after the procedure or as directed by your caregiver. °· Only take medicines as directed by your caregiver. °SEEK MEDICAL CARE IF:  °· You cannot stop coughing. °· You are not urinating at all or less than usual. °SEEK IMMEDIATE MEDICAL CARE IF: °· You have difficulty swallowing. °· You cannot eat or drink. °· You have worsening throat or chest pain. °· You have dizziness, lightheadedness, or you faint. °· You have nausea or vomiting. °· You have chills. °· You have a fever. °· You have severe abdominal pain. °· You have black, tarry, or bloody stools. °Document Released: 05/03/2012 Document Reviewed: 05/03/2012 °ExitCare® Patient Information ©2015 [employer]. This information is not intended to replace advice given to you by your health care provider. Make sure you   discuss any questions you have with your health care provider.    Diverticulosis Diverticulosis is the condition that develops when small pouches (diverticula) form in the wall of your colon. Your colon, or large intestine, is where water is absorbed and stool is formed. The pouches form when the inside layer of your colon pushes through weak spots in the outer layers of your colon. CAUSES  No one knows exactly what causes diverticulosis. RISK  FACTORS  Being older than 12. Your risk for this condition increases with age. Diverticulosis is rare in people younger than 40 years. By age 33, almost everyone has it.  Eating a low-fiber diet.  Being frequently constipated.  Being overweight.  Not getting enough exercise.  Smoking.  Taking over-the-counter pain medicines, like aspirin and ibuprofen. SYMPTOMS  Most people with diverticulosis do not have symptoms. DIAGNOSIS  Because diverticulosis often has no symptoms, health care providers often discover the condition during an exam for other colon problems. In many cases, a health care provider will diagnose diverticulosis while using a flexible scope to examine the colon (colonoscopy). TREATMENT  If you have never developed an infection related to diverticulosis, you may not need treatment. If you have had an infection before, treatment may include:  Eating more fruits, vegetables, and grains.  Taking a fiber supplement.  Taking a live bacteria supplement (probiotic).  Taking medicine to relax your colon. HOME CARE INSTRUCTIONS   Drink at least 6-8 glasses of water each day to prevent constipation.  Try not to strain when you have a bowel movement.  Keep all follow-up appointments. If you have had an infection before:  Increase the fiber in your diet as directed by your health care provider or dietitian.  Take a dietary fiber supplement if your health care provider approves.  Only take medicines as directed by your health care provider. SEEK MEDICAL CARE IF:   You have abdominal pain.  You have bloating.  You have cramps.  You have not gone to the bathroom in 3 days. SEEK IMMEDIATE MEDICAL CARE IF:   Your pain gets worse.  Yourbloating becomes very bad.  You have a fever or chills, and your symptoms suddenly get worse.  You begin vomiting.  You have bowel movements that are bloody or black. MAKE SURE YOU:  Understand these instructions.  Will  watch your condition.  Will get help right away if you are not doing well or get worse. Document Released: 02/12/2004 Document Revised: 05/22/2013 Document Reviewed: 04/11/2013 [facility] Patient Information 2015 [place], [place]. This information is not intended to replace advice given to you by your health care provider. Make sure you discuss any questions you have with your health care provider.

## 2014-04-04 ENCOUNTER — Encounter (HOSPITAL_COMMUNITY): Payer: Self-pay | Admitting: Gastroenterology

## 2014-04-04 ENCOUNTER — Ambulatory Visit (HOSPITAL_COMMUNITY): Admission: RE | Admit: 2014-04-04 | Payer: Medicare Other | Source: Ambulatory Visit | Admitting: Cardiovascular Disease

## 2014-04-04 ENCOUNTER — Encounter (HOSPITAL_COMMUNITY): Admission: RE | Payer: Self-pay | Source: Ambulatory Visit

## 2014-04-04 SURGERY — LEFT HEART CATHETERIZATION WITH CORONARY ANGIOGRAM
Anesthesia: LOCAL

## 2014-04-05 ENCOUNTER — Encounter: Payer: Self-pay | Admitting: Gastroenterology

## 2014-04-08 ENCOUNTER — Telehealth: Payer: Self-pay | Admitting: Gastroenterology

## 2014-04-08 NOTE — Telephone Encounter (Signed)
Wife wants to be certain there is nothing else that needs to be done. Does he need any follow up procedure?

## 2014-04-09 DIAGNOSIS — I214 Non-ST elevation (NSTEMI) myocardial infarction: Secondary | ICD-10-CM | POA: Diagnosis not present

## 2014-04-09 DIAGNOSIS — E782 Mixed hyperlipidemia: Secondary | ICD-10-CM | POA: Diagnosis not present

## 2014-04-09 DIAGNOSIS — K269 Duodenal ulcer, unspecified as acute or chronic, without hemorrhage or perforation: Secondary | ICD-10-CM | POA: Diagnosis not present

## 2014-04-09 DIAGNOSIS — I2510 Atherosclerotic heart disease of native coronary artery without angina pectoris: Secondary | ICD-10-CM | POA: Diagnosis not present

## 2014-04-10 ENCOUNTER — Other Ambulatory Visit: Payer: Self-pay

## 2014-04-10 ENCOUNTER — Other Ambulatory Visit: Payer: Medicare Other

## 2014-04-10 DIAGNOSIS — K5732 Diverticulitis of large intestine without perforation or abscess without bleeding: Secondary | ICD-10-CM

## 2014-04-10 DIAGNOSIS — K5730 Diverticulosis of large intestine without perforation or abscess without bleeding: Secondary | ICD-10-CM

## 2014-04-10 MED ORDER — LISINOPRIL 2.5 MG PO TABS: 2.5000 mg | ORAL_TABLET | Freq: Every day | ORAL | Status: DC

## 2014-04-10 MED ORDER — METOPROLOL TARTRATE 25 MG PO TABS: 12.5000 mg | ORAL_TABLET | Freq: Two times a day (BID) | ORAL | Status: DC

## 2014-04-10 NOTE — Telephone Encounter (Signed)
Let's keep appt that day and we can discuss cath then. cdm

## 2014-04-10 NOTE — Telephone Encounter (Signed)
Pt notified and will plan on being here for appt on December 4th

## 2014-04-10 NOTE — Telephone Encounter (Signed)
Wife contacted. Scheduled for barium enema at [facility] 04/18/14. Prep instructions at the front for pick up.

## 2014-04-10 NOTE — Telephone Encounter (Signed)
Pt left message on refill line requesting call regarding cath. Message indicates he is not available on November 16 or 18. I spoke with pt who reports Dr. Deatra Ina told him ulcers have not completely healed and pt should not have cath yet.  Will probably be able to have done in 2-3 week but pt would like to wait at least a month.  He will contact us when he wants to schedule.  Pt was to see Dr. Angelena Form on December 4 for post cath appt.  Will check with Dr. Angelena Form to see if needs office visit prior to rescheduling cath. Pt also needs refill of lisinopril and metoprolol. Will send to [facility] in [place].

## 2014-04-10 NOTE — Telephone Encounter (Signed)
Pt needs single contrast BE

## 2014-04-18 ENCOUNTER — Other Ambulatory Visit: Payer: Self-pay | Admitting: Gastroenterology

## 2014-04-18 ENCOUNTER — Ambulatory Visit (HOSPITAL_COMMUNITY)
Admission: RE | Admit: 2014-04-18 | Discharge: 2014-04-18 | Disposition: A | Discharge status: Home / Self Care | Payer: Medicare Other | Source: Ambulatory Visit | Attending: Gastroenterology | Admitting: Gastroenterology

## 2014-04-18 DIAGNOSIS — K5732 Diverticulitis of large intestine without perforation or abscess without bleeding: Secondary | ICD-10-CM

## 2014-04-18 DIAGNOSIS — K5730 Diverticulosis of large intestine without perforation or abscess without bleeding: Secondary | ICD-10-CM | POA: Insufficient documentation

## 2014-04-19 ENCOUNTER — Telehealth: Payer: Self-pay | Admitting: Gastroenterology

## 2014-04-19 NOTE — Telephone Encounter (Signed)
Normal barium enema except diverticulosis. Gastric biopsies okay. Patient's wife calls with questions.Do you want the patient to go high fiber on his diet? Does he need a follow up office appointment?

## 2014-04-22 ENCOUNTER — Institutional Professional Consult (permissible substitution): Payer: Medicare Other | Admitting: Cardiovascular Disease

## 2014-04-22 NOTE — Telephone Encounter (Signed)
Advised of high fiber diet.

## 2014-04-22 NOTE — Telephone Encounter (Signed)
High-fiber diet. He should follow-up as needed

## 2014-04-30 ENCOUNTER — Telehealth: Payer: Self-pay | Admitting: Gastroenterology

## 2014-04-30 ENCOUNTER — Other Ambulatory Visit: Payer: Self-pay

## 2014-04-30 MED ORDER — ESOMEPRAZOLE MAGNESIUM 40 MG PO CPDR: 40.0000 mg | DELAYED_RELEASE_CAPSULE | Freq: Two times a day (BID) | ORAL | Status: DC

## 2014-05-01 ENCOUNTER — Ambulatory Visit: Payer: Medicare Other | Admitting: Gastroenterology

## 2014-05-03 ENCOUNTER — Ambulatory Visit (INDEPENDENT_AMBULATORY_CARE_PROVIDER_SITE_OTHER): Payer: Medicare Other | Admitting: Cardiovascular Disease

## 2014-05-03 ENCOUNTER — Encounter: Payer: Self-pay | Admitting: Cardiovascular Disease

## 2014-05-03 VITALS — BP 120/80 | HR 70 | Ht 68.0 in | Wt 159.0 lb

## 2014-05-03 DIAGNOSIS — E785 Hyperlipidemia, unspecified: Secondary | ICD-10-CM

## 2014-05-03 DIAGNOSIS — I214 Non-ST elevation (NSTEMI) myocardial infarction: Secondary | ICD-10-CM

## 2014-05-03 NOTE — Patient Instructions (Signed)
Your physician recommends that you schedule a follow-up appointment in:  3 months. Scheduled for August 06, 2014 at 10:30  Your physician recommends that you continue on your current medications as directed. Please refer to the Current Medication list given to you today.

## 2014-05-03 NOTE — Progress Notes (Signed)
History of Present Illness: 80 yo male with history of PE/DVT on chronic coumadin therapy prior to September 2015, COPD, prostate cancer, OSA, HLD, chronic kidney disease who is here today for cardiac follow up. He was seen as a new patient 03/25/14 for cardiac evaluation. He was in [place] September 2015 and had syncopal episode. He felt dizzy before the episode. No palpitations. He was taken to a [facility] where he was diagnosed with non-STEMI, coumadin was held and he was placed on IV heparin. 2 days later had significant upper GI bleed that required resuscitation with blood and emergent endoscopy which showed multiple bleeding gastric ulcers. Cardiac cath was canceled due to the GI bleeding. He also had runs of non-sustained VT and was placed on amiodarone but this was changed to metoprolol before discharge. He spent 9 days in the hospital and released on 02/15/14. Since then he has not had any chest pain. Echo 02/08/14 from [facility] with LVEF=40-45%. I arranged a cardiac cath after his first visit but he cancelled this pending GI workup which was requested by his family. EGD 04/03/14 with active 31mm ulcer at the duodenal bulb. Otherwise EGD was normal. Severe sigmoid diverticulosis noted on colonoscopy. Barium enema with diverticulosis but no strictures or extravasation. He has been on no anti-coagulation. ASA causes hives.   He is here today for follow up. He is feeling great. No chest pain or SOB. No black stools or coffee ground emesis. Taking Nexium BID.   Primary Care Physician: Rogelia Mire  Past Medical History  Diagnosis Date  . Prostate cancer 1992    in remission.  s/p XRT  . Emphysema   . Allergic rhinitis   . Asthma   . COPD (chronic obstructive pulmonary disease)   . Recurrent upper respiratory infection (URI)     coughing from chronic sinusitis   . Normal cardiac stress test     reports stress test was several yrs. ago /w [employer]. Dr. Rogelia Mire, WNL   . OSA  (obstructive sleep apnea) 2007    CPAP- report of settings on paper chart, seen by [facility] - seen 07/2011   . Renal stone     some passed spontaneous, & also had cystoscopy  . Arthritis     hands  . Neuromuscular disorder     reports shaking, somedays "can't hold spoon" - generalized  shakiness, is going to talk to MD about it at next appt.   . High cholesterol   . Shortness of breath on exertion   . H/O hiatal hernia   . Osteoarthritis, generalized   . Chronic renal insufficiency, stage III (moderate)   . Mixed hyperlipidemia   . PE (pulmonary embolism) 10/2011  . Bleeding gastric ulcer     Past Surgical History  Procedure Laterality Date  . Lung surgery  1986    presumed from RLL. Segmentectomy. Benign Nodule.   . Sinus endo w/fusion  sch. 09/15/2011    Procedure: ENDOSCOPIC SINUS SURGERY WITH FUSION NAVIGATION;  Surgeon: Ascencion Dike, MD;  Location: [place];  Service: ENT;  Laterality: Bilateral;  bilateral maxillary antrostomy, bilateral endoscopic ethmoidectomy, bilateral sphenoidectomy, bilateral frontal recess exploration  . Septoplasty  09/15/11  . Cataract extraction w/ intraocular lens  implant, bilateral  ~ 2000  . Ureteroscopy  1980's  . Sinus endo w/fusion  09/15/2011    Procedure: ENDOSCOPIC SINUS SURGERY WITH FUSION NAVIGATION;  Surgeon: Ascencion Dike, MD;  Location: [place];  Service:  ENT;  Laterality: Bilateral;  . Maxillary antrostomy  09/15/2011    Procedure: MAXILLARY ANTROSTOMY;  Surgeon: Ascencion Dike, MD;  Location: [facility] OR;  Service: ENT;  Laterality: Bilateral;  TOTAL MAXILLARY ANTROSTOMY  . Sinus exploration  09/15/2011    Procedure: SINUS EXPLORATION;  Surgeon: Ascencion Dike, MD;  Location: [facility] OR;  Service: ENT;  Laterality: Bilateral;  FRONTAL RECESS EXPLORATION  . Sphenoidectomy  09/15/2011    Procedure: Coralee Pesa;  Surgeon: Ascencion Dike, MD;  Location: [place];  Service: ENT;  Laterality: Bilateral;  . Septoplasty  09/15/2011    Procedure: SEPTOPLASTY;   Surgeon: Ascencion Dike, MD;  Location: [place];  Service: ENT;  Laterality: Bilateral;  . Laparotomy N/A 08/28/2013    Procedure: EXPLORATORY LAPAROTOMY;  Surgeon: Rolm Bookbinder, MD;  Location: [place];  Service: General;  Laterality: N/A;  . Repair of perforated ulcer N/A 08/28/2013    Procedure: DUODENAL ULCER PATCH;  Surgeon: Rolm Bookbinder, MD;  Location: [place];  Service: General;  Laterality: N/A;  . Gastrostomy N/A 08/28/2013    Procedure: GASTROSTOMY;  Surgeon: Rolm Bookbinder, MD;  Location: [place];  Service: General;  Laterality: N/A;  . Esophagogastroduodenoscopy N/A 08/28/2013    Procedure: ESOPHAGOGASTRODUODENOSCOPY (EGD);  Surgeon: Gayland Curry, MD;  Location: [place];  Service: General;  Laterality: N/A;  . Melanoma excision Left     under left eye  . Esophagogastroduodenoscopy N/A 04/03/2014    Procedure: ESOPHAGOGASTRODUODENOSCOPY (EGD);  Surgeon: Inda Castle, MD;  Location: [facility];  Service: Endoscopy;  Laterality: N/A;  . Colonoscopy N/A 04/03/2014    Procedure: COLONOSCOPY;  Surgeon: Inda Castle, MD;  Location: [facility];  Service: Endoscopy;  Laterality: N/A;    Current Outpatient Prescriptions  Medication Sig Dispense Refill  . Aclidinium Bromide (TUDORZA PRESSAIR) 400 MCG/ACT AEPB Inhale 1 puff into the lungs 2 (two) times daily. 1 each 5  . albuterol (VENTOLIN HFA) 108 (90 BASE) MCG/ACT inhaler Inhale 2 puffs into the lungs every 6 (six) hours as needed for wheezing or shortness of breath. 1 Inhaler 2  . atorvastatin (LIPITOR) 10 MG tablet Take 10 mg by mouth daily.     . budesonide-formoterol (SYMBICORT) 160-4.5 MCG/ACT inhaler Inhale 2 puffs into the lungs 2 (two) times daily.    [name] Kitchen esomeprazole (NEXIUM) 40 MG capsule Take 1 capsule (40 mg total) by mouth 2 (two) times daily before a meal. (Patient taking differently: Take 40 mg by mouth daily at 12 noon. ) 60 capsule 5  . fluocinonide cream (LIDEX) 3.53 % Apply 1 application topically daily as needed  (rash). To infected area.    . fluticasone (FLONASE) 50 MCG/ACT nasal spray Place 1 spray into the nose 2 (two) times daily. Each nostril once a day.    . ketoconazole (NIZORAL) 2 % shampoo Apply 1 application topically 2 (two) times a week.     [name] Kitchen lisinopril (PRINIVIL,ZESTRIL) 2.5 MG tablet Take 1 tablet (2.5 mg total) by mouth daily. 30 tablet 6  . loratadine (CLARITIN) 10 MG tablet Take 10 mg by mouth daily.    . metoprolol tartrate (LOPRESSOR) 25 MG tablet Take 0.5 tablets (12.5 mg total) by mouth 2 (two) times daily. 30 tablet 6  . nitroGLYCERIN (NITROSTAT) 0.4 MG SL tablet Place 0.4 mg under the tongue every 5 (five) minutes as needed for chest pain.    . phenol (CHLORASEPTIC) 1.4 % LIQD Use as directed 1 spray in the mouth or throat as needed for throat irritation /  pain.     No current facility-administered medications for this visit.    Allergies  Allergen Reactions  . Mometasone Furoate Other (See Comments)    Nasonex: CAUSED RED,ITCHY EYES; "lost my eyelashes"   . Naproxen Sodium Nausea And Vomiting    "thought I was going to die"  . Nsaids Other (See Comments)    Stomach ulcers  . Anoro Ellipta [Umeclidinium-Vilanterol]     Double vision  . Aspirin Hives and Nausea And Vomiting     Chills, sweats  . Breo Ellipta [Fluticasone Furoate-Vilanterol] Hives  . Omnaris [Ciclesonide] Other (See Comments)    Nose bleed   . Spiriva Handihaler [Tiotropium Bromide Monohydrate] Other (See Comments)    Hiccups     History   Social History  . Marital Status: Married    Spouse Name: N/A    Number of Children: 4  . Years of Education: N/A   Occupational History  . 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     Social History Main Topics  . Smoking status: Former Smoker -- 1.00 packs/day for 60 years    Types: Cigarettes    Quit date: 08/06/2011  . Smokeless tobacco: Never Used  . Alcohol Use: 0.0 oz/week     Comment: rarely  . Drug Use: No  . Sexual Activity: No   Other Topics Concern  .  Not on file   Social History Narrative   Traveled to [place].   Married and lives with wife.   Originally from [place].   Spends winters in [place]   Has 2 kids in [place]          Family History  Problem Relation Age of Onset  . Prostate cancer Brother   . Prostate cancer Brother   . Prostate cancer Brother   . Asthma Father   . Heart disease Mother     MI age 78  . Anesthesia problems Neg Hx   . Heart attack Mother   . Stroke Sister     Review of Systems:  As stated in the HPI and otherwise negative.   BP 120/80 mmHg  Pulse 70  Ht 5\' 8"  (1.727 m)  Wt 159 lb (72.122 kg)  BMI 24.18 kg/m2  SpO2 99%  Physical Examination: General: Well developed, well nourished, NAD HEENT: OP clear, mucus membranes moist SKIN: warm, dry. No rashes. Neuro: No focal deficits Musculoskeletal: Muscle strength 5/5 all ext Psychiatric: Mood and affect normal Neck: No JVD, no carotid bruits, no thyromegaly, no lymphadenopathy. Lungs:Clear bilaterally, no wheezes, rhonci, crackles Cardiovascular: Regular rate and rhythm. No murmurs, gallops or rubs. Abdomen:Soft. Bowel sounds present. Non-tender.  Extremities: No lower extremity edema. Pulses are 2 + in the bilateral DP/PT.  Assessment and Plan:   1. NSTEMI/CAD: Recent NSTEMI in [place] 9/15 but cath delayed due to GI bleeding, gastric ulcers. Recent GI workup here locally per Dr. Deatra Ina with ucler noted duodenum and evidence of non-bleeding diverticulosis. Will allow his ulcer to heal for another 3 months. NO urgency for cath as he is feeling well. Continue statin and beta blocker/Ace-inh. He will call with change in clinical status.   2. HLD: continue statin.

## 2014-05-14 DIAGNOSIS — J339 Nasal polyp, unspecified: Secondary | ICD-10-CM | POA: Diagnosis not present

## 2014-05-14 DIAGNOSIS — J4530 Mild persistent asthma, uncomplicated: Secondary | ICD-10-CM | POA: Diagnosis not present

## 2014-05-14 DIAGNOSIS — J3089 Other allergic rhinitis: Secondary | ICD-10-CM | POA: Diagnosis not present

## 2014-05-14 DIAGNOSIS — R21 Rash and other nonspecific skin eruption: Secondary | ICD-10-CM | POA: Diagnosis not present

## 2014-05-28 ENCOUNTER — Encounter (HOSPITAL_COMMUNITY): Payer: Self-pay | Admitting: Gastroenterology

## 2014-06-05 ENCOUNTER — Telehealth: Payer: Self-pay | Admitting: Gastroenterology

## 2014-06-05 MED ORDER — ESOMEPRAZOLE MAGNESIUM 40 MG PO CPDR: 40.0000 mg | DELAYED_RELEASE_CAPSULE | Freq: Two times a day (BID) | ORAL | Status: DC

## 2014-06-05 NOTE — Telephone Encounter (Signed)
Called pt to inform med sent and called pharmacy to make sure generic was sent in.

## 2014-06-13 ENCOUNTER — Encounter (HOSPITAL_BASED_OUTPATIENT_CLINIC_OR_DEPARTMENT_OTHER): Payer: Self-pay | Admitting: Otolaryngology

## 2014-06-13 DIAGNOSIS — I2782 Chronic pulmonary embolism: Secondary | ICD-10-CM | POA: Diagnosis not present

## 2014-06-13 DIAGNOSIS — M609 Myositis, unspecified: Secondary | ICD-10-CM | POA: Diagnosis not present

## 2014-06-17 DIAGNOSIS — J310 Chronic rhinitis: Secondary | ICD-10-CM | POA: Diagnosis not present

## 2014-06-17 DIAGNOSIS — J330 Polyp of nasal cavity: Secondary | ICD-10-CM | POA: Diagnosis not present

## 2014-06-18 DIAGNOSIS — G2581 Restless legs syndrome: Secondary | ICD-10-CM | POA: Diagnosis not present

## 2014-07-09 DIAGNOSIS — L219 Seborrheic dermatitis, unspecified: Secondary | ICD-10-CM | POA: Diagnosis not present

## 2014-07-09 DIAGNOSIS — D225 Melanocytic nevi of trunk: Secondary | ICD-10-CM | POA: Diagnosis not present

## 2014-07-09 DIAGNOSIS — L821 Other seborrheic keratosis: Secondary | ICD-10-CM | POA: Diagnosis not present

## 2014-07-09 DIAGNOSIS — Z85828 Personal history of other malignant neoplasm of skin: Secondary | ICD-10-CM | POA: Diagnosis not present

## 2014-07-09 DIAGNOSIS — L570 Actinic keratosis: Secondary | ICD-10-CM | POA: Diagnosis not present

## 2014-07-09 DIAGNOSIS — D485 Neoplasm of uncertain behavior of skin: Secondary | ICD-10-CM | POA: Diagnosis not present

## 2014-07-10 DIAGNOSIS — D0439 Carcinoma in situ of skin of other parts of face: Secondary | ICD-10-CM | POA: Diagnosis not present

## 2014-07-15 ENCOUNTER — Ambulatory Visit (INDEPENDENT_AMBULATORY_CARE_PROVIDER_SITE_OTHER): Payer: PRIVATE HEALTH INSURANCE | Admitting: General Practice

## 2014-07-17 ENCOUNTER — Ambulatory Visit (INDEPENDENT_AMBULATORY_CARE_PROVIDER_SITE_OTHER): Payer: Medicare Other | Admitting: Adult Health

## 2014-07-17 ENCOUNTER — Encounter (INDEPENDENT_AMBULATORY_CARE_PROVIDER_SITE_OTHER): Payer: Self-pay

## 2014-07-17 ENCOUNTER — Encounter: Payer: Self-pay | Admitting: Adult Health

## 2014-07-17 VITALS — BP 110/62 | HR 54 | Temp 97.4°F | Ht 68.0 in | Wt 157.0 lb

## 2014-07-17 DIAGNOSIS — J432 Centrilobular emphysema: Secondary | ICD-10-CM | POA: Diagnosis not present

## 2014-07-17 DIAGNOSIS — I2699 Other pulmonary embolism without acute cor pulmonale: Secondary | ICD-10-CM | POA: Diagnosis not present

## 2014-07-17 NOTE — Progress Notes (Signed)
Subjective:    Patient ID: Jon White, male    DOB: 09/20/1932, 81 y.o.   MRN: [id number]  HPI   isit Type:  Follow-up Copy to:  Dr. Emilee Hero Primary Provider/Referring Provider:  Dr Merrilee Seashore PMD, DR [facility] ENT, Dr. Deatra Ina GI, Dr Donneta Romberg - Allergy   reports that he quit smoking about 23 months ago. His smoking use included Cigarettes. He has a 60 pack-year smoking history. He has never used smokeless tobacco.   OV April 2011:  Fu copd/asthma: chronic cough and cough variant asthma.,  Last visits March and November 2010. Since then has seen Dr. Erik Obey who is contemplating sinus surgery if medical Rx fails. He has continued nasal steroid ciclesonide, daily saline wash via nettpot. However, he did see Dr. Donneta Romberg for allergy eval. Reportedly skin testing mildly positive for mold but otherwise negative. Was started on singulair. Afer that sinus drainage is "90% better". Able to cope with current levels of sinus drainage. Using tissue only 4-5 times per day as oppsed to >10-20 times per day. Re: GERD. He is not on PPI. Still having some cough and tickle in throat after he eats. Wants to get rid of it. Does not think this element of cough is related to sinus drainage. No other complaints. No dyspnea. Contnues with symbicort. Due to go back to [facility] for 5 months starting October 29, 2009  Problem # 1:  COUGH (ICD-786.2)   Etiologys a) elated to sinus drainage and mucus; b) Silent GERD  (he has hiatal hernia & esophagitis on ct chest 02/2008 + hx of hiccups + hx of intermittent dysphagia -.sp eso dilataition 07/2008; and c)  Cough variant asthma coexisting based on recurrence of chest tightness after dc of symbicort 07/2008  > On 09/10/2009: improved control after Dr. Donneta Romberg added singulair to regimen in Mid -feb 2011. Stil lhaving some cough that is likely due to GERD versus Habit cough. He is not on PPI and wonder why. He is not concerned about aspirin as cause and is continuing  it  plan Sinus per Dr Erik Obey nad Dr. Donneta Romberg GI per Dr Deatra Ina but will add PPI nexium Cough Variant asthma - contnue symbicort He will call in 1 month. If element of cough that is thought as due to GERD is betteer then we will continue PPI. Otherwise, will consider neurontin for habit cough Will get records from DR sharma     Patient Instructions: 1)  continue your medicines 2)  add nexium to your current regimen 3)  take it on empty stomach 4)  follow GERD diet advice 5)  Call me in 1 month to see if cough even better 6)  Depending on your call I will advise you next step   OV 05/18/2012  Not seen in > 2 years. He has been referred back for new problem of PE. IN November 18, 2011 one week after long car trip to [place] he was helping someone out with some heavy lifting at camp site and then became acutely unwell. Rushed to [facility] ER. Where per hx andreview of recoords: He was diagnosed with DVT left popliteal veint and RLL Sup Segment PE on CT June 201, 2013. Other tests showed: trop < 0.04, ddimer 5.6,  ECHO 11/19/11 - Normal LVEF. Mild TR, Mild PHA. Mild LVH. Rx as PE for 3-4 months with coumadin. Currently dyspneic ever since PE - simple activitis make him dyspneic. Gained 8#. Also 6 days in hospital. Walking desaturation  test on 05/18/2012 185 feet x 3 laps:  did NOT desaturate. Rest pulse ox was 96%, final pulse ox was 91%. HR response 71/min at rest to 89/min at peak exertion.   Of note, CT June 2013 in [facility] per their report (image n/a)  was significant for other findings that include: a) emphysema; b)   abnormal esophagus, hiatal hernia - rec endoscopy, and c) RML and R Anterior Base infiltrates - rad recommending followup   REC In order to sort out your shortness of breath have CT angio chest, echo and PFT  REturn for followup after above  Will refer you to coumadin clinic  OV 06/12/12 Presents with wife. Here to review test results. He is now back on coumadin with INR >  2. Still no change in dyspnea. This is the dyspnea that got worse after the PE diagnosis. Reports no new complaints but main issue is persistent dyspnea class 2-3 along with cough and a severe inability / struggle to bring out sputumwhich is scanty and yellopw. This has made voice hoarse past few weeks. Otherwise all same and he is frustrated. Reviewed meds: compliant with symbicort. He is not on spiriva; tried it bfore but quit due to urinary incontinence. He feels nebs would be better choice for him but willing to try tudorza    CT shows a) no clot but b) has small node in chest and emphysema - all old and unchnaged. However, has 2 mm stone in upper pole left kidney with minimal left renal collecting system dilatation noted; This is called hydronephrosis and he has contacted his urologist about it.   ECHO 1224/13  - PA pressure could not be estimated but essentially nromal Echo so PAP thought to be normal  PFT  06/12/12 PFT FVC fev1 ratio BD fev1 TLC DLCO coments  06/12/2012  2.7L/70% 1.1L/42% 39(67) 38% 6.8L/114% 9.7/56% Copd with asthma component. Huge BD response  07/10/2012   3.8 L/92%   1.97 L/64%   52      almost 1 L improvement since starting  tudorza    Past, Family, Social reviewed: no change since last visit  REC The reasoon you got more short of breath after blood clot treatment is not because of blood clot issues but becasuse asthma/copd is acting up significantly  Take doxycycline 100mg  po twice daily x 5 days; take after meals and avoid sunlight  Please take Take prednisone 40mg  once daily x 3 days, then 30mg  once daily x 3 days, then 20mg  once daily x 3 days, then prednisone 10mg  once daily x 3 days and stop  Continue symbicort 2 puff twice daily  Try tudorza 1 puff twice a day - learn technique. Monitor for urinary retention. IF this is a problem, call us immediately and stop the drug  Call me in 2 weeks to report response  I will see you in 4 weeks - 6 weeks to see progress   IF above strategy does not work , we can try BREO mdi or nebs  At followup do spirometry    OV 07/10/2012 - Jon White returns for followup. After startinng [name] he feels 80% better. On his spirometry his FEV1 has improved almost a liter from 1.1 L to 1.97 L/64%. He continue Symbicort. He still has some residual mucus production that is blotchy postnasal drip and yellow color early in the morning. He uses his nasal steroids diligently but does not use nasal saline spray regularly. He never used Nettie pot or  hypertonic saline spray   - There no new issues and for the spring 2014 he plans to go to [place] as part of his annual spring to summer visit  PFT  06/12/12 PFT FVC fev1 ratio BD fev1 TLC DLCO coments  06/12/2012  2.7L/70% 1.1L/42% 39(67) 38% 6.8L/114% 9.7/56% Copd with asthma component. Huge BD response  07/10/2012   3.8 L/92%   1.97 L/64%   52      almost 1 L improvement since starting  tudorza    #COPD  Continue symbicort 2 puff twice daily  tudorza 1 puff twice a day  Use albuterol as needed  I recommend you take N acetylcysteine 600 mg twice a day.  - There is research from [place] published in [place] that shows if you takes N-acetylcysteine cuts down acute exacerbation COPD by 20%. The drug functions by being an anti-oxidant. This drug will not make Jon White feel any better but will make the chances of this having an exacerbation less likely The drug is cheap and is over-the-counter at [facility] vitamin store or [employer]. There are no side effects. I have experience with with the drug with pulmonary fibrosis patients so I recommend it strongly  REturn in October 2014 or sooner if needed  Ensure fu in [place] during summer 2014  #Sinus draingae  - use netti pot or 3% neill-med saline spray daily at night for nose  - continue other measures advised by Dr Benjamine Mola  #Folllowup  - Oct 2014     OV 03/19/2013  COPD/asthma,, pulmonary embolism,  chronic rhinosinusitis followup  He is back from [place] for the winter. He has been doing well. He developed urinary retention again with the second anti-cholinergic inhaler namely tud orza. So he is discontinuing himself on Symbicort. No deterioration in respiratory symptoms. He's had a flu shot. Overall COPD stable and cat Score is 14  In terms of chronic rhinosinusitis this is unchanged. He is not fully compliant with his saline and nasal steroids.   Labs: for pulm embolism he continues on Coumadin. Last checked in June 2014 at our Coumadin clinic INR was therapeutic. He is been checking it in [place] and according to his history it has been therapeutic. He plans to re\re register with our Coumadin clinic     04/09/2013 Follow up for COPD  Patient returns for a followup for COPD. Last visit. Patient was changed from [place] to [place] due to urinary retention on Tudorza  Patient says that he has felt improved with decreased shortness of breath. No flare in cough or shortness of breath. Patient denies any chest pain, orthopnea, PND, or leg swelling.  Patient denies any urinary retention or urinary urgency.  spirometry today shows FEV1 at  1.7 liters, 56% , ratio 52%   OV 07/09/2013  Chief Complaint  Patient presents with  . COPD    follow-up. Pt denies any increase in SOB or cough at this time.     Followup COPD:: Feno at Dr Remus Blake office 05/15/13 was 47 and low prob for eos asthma. He did not tolerate  ANORO DUE to double vision and is back on [place]  and Symbicort. He feels this combination was really well for him. COPD cat score is currently 12 and his symptoms are detailed below  Pulmonary embolism: In June 2015 it'll be 2 years and she suffered pulmonary embolus and following road from [place] to [place]. He is been compliant with Coumadin.  Most recent INR 07/02/2013 is greater than 2. There no bleeding episodes. However he is frustrated taking anti-coagulation and  would like to come off. He is agreeable to taking Coumadin for INR greater than 1.5 starting June 2015 to continue to keep the risk of recurrent PE low  Chronic rhinosinusitis:: This is due to a polyp. He is followed by ENT Dr. Elgie Congo. He did have recent prednisone burst and this really helped. He says that his quality-of-life is mostly restricted because of rhinosinusitis. However he is at baseline it is not any worse  Imaging:  Last CT chest 05/19/12  - IMPRESSION:  No evidence of pulmonary embolism.  Changes of COPD and prior right lower lobe surgery.  Stable 4 mm left lower lobe nodule.  Question right renal cyst with 2 mm left kidney stone and minimal  left hydronephrosis.  Original Report Authenticated By: Lavonia Dana, M.D.    Ok Edwards 10/23/2013  Chief Complaint  Patient presents with  . Follow-up    Pt c/o worsening SOB since last OV. C/o mild CP with deep breaths. c/o cough producing white and yellow mucous.     Followup COPD with BD response (low Feno at Dr Remus Blake office 05/15/13). He is  on [place]  and Symbicort. He feels this combination was really well for him. COPD CAT Score worse to 27 compared to baseline but this is because of ICU Stay due to duodenal ulcer perf in March/April 2015.   Pulmonary embolism: In June 2015 it'll be 2 years and she suffered pulmonary embolus and following road from [place] to [place]. He is been compliant with Coumadin. Most recent INR 07/02/2013 is greater than 2 on 10/09/13 . There no bleeding episodes but in march/april 2015 he suffered life threatening duodenal ulcer peforation. So though he is back on coumadin, he would prefer to come off. We agreed to check d-dimer: if normal will dc coumadin. If high, consider  taking Coumadin for INR greater than 1.5 starting June 2015 to continue to keep the risk of recurrent PE low  Chronic rhinosinusitis:: This is due to a polyp. He is followed by ENT Dr. Elgie Congo. Due to see him today   New issue is  Fatigue: This is since hospital dc ffom duodenal ulcer perf. He has never had TSH and Vit D check based on EPIC review.  Iam unable to check Vit D due to "medicare limit:. He is willing to attend rehab for copd. He has finished home PT, OT, RN   #COPD  - Continuye your medications - refer pulmonary rehab at [facility]  #Fatigue  - due to post ICU deconditioning  - check VIt D and TSH levels  - rehab for copd will help you  #PE treatment since June 2013 - CT Dec 2013 and March 2015 without evidence of clot  - check d-dimer levels; will call with results. IF normal, can stop coumadin.. - - If high, continue coumadin but will cut INR goal down to > 1.5  #Sinus   - folloow with Dr Benjamine Mola  #Lung cancer screen  - no evidence of lung cancer as of march 2015 - consider CT chest summer 2016; will decide at followup  #FOllowup  2 months    OV 12/11/2013  Chief Complaint  Patient presents with  . Follow-up    Pt states the symbicort isn't helping like it use to. C/o DOE, cough with intermittent clear, yellow and white mucous production. Pt states he had chest soreness  d/t coughing that lasted several days.    Followup  - COPD: he is on tudorza and symbicort. He finishes physical therapy after  ICU stay and he said that he was doing well after that but since they stopped his dyspnea is worse. His sputum volume appears more than usual by mild margin but the sputum color is good and white  and unchanged. There is no worsening cough. He is not so sure that isn't a COPD exacerbation but he feels that his Symbicort is not helping him anymore   - Pulmnaryh embolism: His INR goal is now greater than 1.5. We checked a d-dimer in June 2015 and it was still high. Which is completed over 2 years of treatment so we reduced the INR goal greater than 1.5 but we opted to continue Coumadin   - New issue: He said his abdomen was hurting him I examined this he has new onset cellulitis around the  abdominal incision wound along with the onset post drainage. He will see his surgeon today at 1:15 PM and I made sure that he got this acute appointment   01/09/2014 Follow up COPD  Returns for COPD follow up  Last ov was changed to [place] from symbicort.  Says he is feeling so much better. Feels that BREO is working very well.   Reports breathing is 75% improved since last ov.  DOE is near back to baseline since beginning the [place] Also had abd cellulitis , tx w/ abx -he says that has cleared up.  Remains on coumadin with INR goal 1.5-2.0 , doing well w/ no known bleeding.  No chest pain,orthopnea, edema or fever.     OV 03/13/2014  Chief Complaint  Patient presents with  . Follow-up    Pt was recently admitted to hospital in [place] for perf gastric ulcers and MI. Pt states he is doing better. Pt c/o DOE, mild dry cough. Pt denies CP/tightness.     Followup COPD and pulmonary embolism   The COPD stable. Since I last saw him in July 2015 he did see my nurse practitioner in August 2015. Then just prior to Labor Day Monday he went to [place] to be with his family. Thursday before  Labor Day Monday he had a sudden syncopal episode at home. Taken to [facility] where he was diagnosed with non-STEMI and was placed on IV heparin by the following day and 2 days later had significant upper GI bleed that required resuscitation and with blood and emergent endoscopy which apparently showed multiple gastric bleeding ulcers. Cardiac cath was therefore canceled. He spent 9 days in the hospital and released. During this time he was not on the ventilator did not suffer renal injury did not have pneumonia. Since then he has not had any chest pain. COPD continues to be stable. During his hospitalization he was tried on different inhalers but apparently these produced allergies or hives and these allergies are documented. Currently stable. He is up-to-date with his flu shot. He has a cardiology appointment  pending around Thanksgiving 2015 with Dr. Lauree Chandler  Of note due to the second GI bleed his anticoagulations has been discontinued even though he was taking Coumadin only for an INR goal greater than 1.5 based on a positive d-dimer predicting risk of future clot recurrence   Review of Systems  Constitutional: Negative for fever and unexpected weight change.  HENT: Negative for congestion, dental problem, ear pain, nosebleeds, postnasal drip, rhinorrhea, sinus pressure, sneezing,  sore throat and trouble swallowing.   Eyes: Negative for redness and itching.  Respiratory: Positive for cough and shortness of breath. Negative for chest tightness and wheezing.   Cardiovascular: Negative for palpitations and leg swelling.  Gastrointestinal: Negative for nausea and vomiting.  Genitourinary: Negative for dysuria.  Musculoskeletal: Negative for joint swelling.  Skin: Negative for rash.  Neurological: Negative for headaches.  Hematological: Does not bruise/bleed easily.  Psychiatric/Behavioral: Negative for dysphoric mood. The patient is not nervous/anxious.        Objective:   Physical Exam  Filed Vitals:   03/13/14 1149 03/13/14 1150  BP:  112/80  Pulse:  47  Height:  5\' 8"  (1.727 m)  Weight:  155 lb (70.308 kg)  SpO2: 84% 93%     General:  well developed, well nourished, in no acute distress Head:  normocephalic and atraumatic Eyes:  PERRLA/EOM intact; conjunctiva and sclera clear Ears:  TMs intact and clear with normal canals Nose:  no deformity, discharge, inflammation, or lesions Mouth:  dentures Melampatti Class II.   Neck:  no masses, thyromegaly, or abnormal cervical nodes Chest Wall:  no deformities noted Lungs: No wheezes Heard:  regular rate and rhythm, S1, S2 without murmurs, rubs, gallops, or clicks Abdomen:well healed surgical scar, mild residual scab, no redness  Msk:  no deformity or scoliosis noted with normal posture Pulses:  pulses  normal Extremities:  no clubbing, cyanosis, edema, or deformity noted Neurologic:  CN II-XII grossly intact with normal reflexes, coordination, muscle strength and tone Skin:  intact without lesions or rashes Cervical Nodes:  no significant adenopathy Axillary Nodes:  no significant adenopathy Psych:  alert and cooperative; normal mood and affect; normal attention span and concentration      Assessment & Plan:  COPD -glad is stable  - glad you are uptodate with vaccination - continue symbicort daily 2 puff twice daily  - allergies to different inhalers noted   #PE treatment since June 2013  - given Gi bleed history x 2, no more coumadin for atleast several months to a year  even though d-dimer predicts risk for clot recurrence - in future at some point, can check d-dimer  #Sinus   - folloow with Dr Benjamine Mola  #recent heart attack  - keep new appt with Dr Angelena Form; I will see if he can see you sooner   #Followup  - 4 months with me or NP Evangelia Whitaker   Call or return sooner if there are problems   Dr. Brand Males, M.D., [facility].C.P Pulmonary and Critical Care Medicine Staff Physician [facility] Pager: [phone], If no answer or between  15:00h - 7:00h: call [phone]  03/13/2014 12:35 PM     Subjective:    Patient ID: Jon White, male    DOB: Aug 31, 1932, 81 y.o.   MRN: [id number]  HPI   isit Type:  Follow-up Copy to:  Dr. Emilee Hero Primary Provider/Referring Provider:  Dr Merrilee Seashore PMD, DR [facility] ENT, Dr. Deatra Ina GI, Dr Donneta Romberg - Allergy   reports that he quit smoking about 23 months ago. His smoking use included Cigarettes. He has a 60 pack-year smoking history. He has never used smokeless tobacco.   OV April 2011:  Fu copd/asthma: chronic cough and cough variant asthma.,  Last visits March and November 2010. Since then has seen Dr. Erik Obey who is contemplating sinus surgery if medical Rx fails.  He has continued nasal steroid ciclesonide, daily  saline wash via nettpot. However, he did see Dr. Donneta Romberg for allergy eval. Reportedly skin testing mildly positive for mold but otherwise negative. Was started on singulair. Afer that sinus drainage is "90% better". Able to cope with current levels of sinus drainage. Using tissue only 4-5 times per day as oppsed to >10-20 times per day. Re: GERD. He is not on PPI. Still having some cough and tickle in throat after he eats. Wants to get rid of it. Does not think this element of cough is related to sinus drainage. No other complaints. No dyspnea. Contnues with symbicort. Due to go back to [facility] for 5 months starting October 29, 2009  Problem # 1:  COUGH (ICD-786.2)   Etiologys a) elated to sinus drainage and mucus; b) Silent GERD  (he has hiatal hernia & esophagitis on ct chest 02/2008 + hx of hiccups + hx of intermittent dysphagia -.sp eso dilataition 07/2008; and c)  Cough variant asthma coexisting based on recurrence of chest tightness after dc of symbicort 07/2008  > On 09/10/2009: improved control after Dr. Donneta Romberg added singulair to regimen in Mid -feb 2011. Stil lhaving some cough that is likely due to GERD versus Habit cough. He is not on PPI and wonder why. He is not concerned about aspirin as cause and is continuing it  plan Sinus per Dr Erik Obey nad Dr. Donneta Romberg GI per Dr Deatra Ina but will add PPI nexium Cough Variant asthma - contnue symbicort He will call in 1 month. If element of cough that is thought as due to GERD is betteer then we will continue PPI. Otherwise, will consider neurontin for habit cough Will get records from DR sharma     Patient Instructions: 1)  continue your medicines 2)  add nexium to your current regimen 3)  take it on empty stomach 4)  follow GERD diet advice 5)  Call me in 1 month to see if cough even better 6)  Depending on your call I will advise you next step   OV 05/18/2012  Not seen in > 2 years. He has been  referred back for new problem of PE. IN November 18, 2011 one week after long car trip to [place] he was helping someone out with some heavy lifting at camp site and then became acutely unwell. Rushed to [facility] ER. Where per hx andreview of recoords: He was diagnosed with DVT left popliteal veint and RLL Sup Segment PE on CT June 201, 2013. Other tests showed: trop < 0.04, ddimer 5.6,  ECHO 11/19/11 - Normal LVEF. Mild TR, Mild PHA. Mild LVH. Rx as PE for 3-4 months with coumadin. Currently dyspneic ever since PE - simple activitis make him dyspneic. Gained 8#. Also 6 days in hospital. Walking desaturation test on 05/18/2012 185 feet x 3 laps:  did NOT desaturate. Rest pulse ox was 96%, final pulse ox was 91%. HR response 71/min at rest to 89/min at peak exertion.   Of note, CT June 2013 in [facility] per their report (image n/a)  was significant for other findings that include: a) emphysema; b)   abnormal esophagus, hiatal hernia - rec endoscopy, and c) RML and R Anterior Base infiltrates - rad recommending followup   REC In order to sort out your shortness of breath have CT angio chest, echo and PFT  REturn for followup after above  Will refer you to coumadin clinic  OV 06/12/12 Presents with wife. Here to review test results. He is now back on  coumadin with INR > 2. Still no change in dyspnea. This is the dyspnea that got worse after the PE diagnosis. Reports no new complaints but main issue is persistent dyspnea class 2-3 along with cough and a severe inability / struggle to bring out sputumwhich is scanty and yellopw. This has made voice hoarse past few weeks. Otherwise all same and he is frustrated. Reviewed meds: compliant with symbicort. He is not on spiriva; tried it bfore but quit due to urinary incontinence. He feels nebs would be better choice for him but willing to try tudorza    CT shows a) no clot but b) has small node in chest and emphysema - all old and unchnaged. However, has 2 mm stone  in upper pole left kidney with minimal left renal collecting system dilatation noted; This is called hydronephrosis and he has contacted his urologist about it.   ECHO 1224/13  - PA pressure could not be estimated but essentially nromal Echo so PAP thought to be normal  PFT  06/12/12 PFT FVC fev1 ratio BD fev1 TLC DLCO coments  06/12/2012  2.7L/70% 1.1L/42% 39(67) 38% 6.8L/114% 9.7/56% Copd with asthma component. Huge BD response  07/10/2012   3.8 L/92%   1.97 L/64%   52      almost 1 L improvement since starting  tudorza    Past, Family, Social reviewed: no change since last visit  REC The reasoon you got more short of breath after blood clot treatment is not because of blood clot issues but becasuse asthma/copd is acting up significantly  Take doxycycline 100mg  po twice daily x 5 days; take after meals and avoid sunlight  Please take Take prednisone 40mg  once daily x 3 days, then 30mg  once daily x 3 days, then 20mg  once daily x 3 days, then prednisone 10mg  once daily x 3 days and stop  Continue symbicort 2 puff twice daily  Try tudorza 1 puff twice a day - learn technique. Monitor for urinary retention. IF this is a problem, call us immediately and stop the drug  Call me in 2 weeks to report response  I will see you in 4 weeks - 6 weeks to see progress  IF above strategy does not work , we can try BREO mdi or nebs  At followup do spirometry    OV 07/10/2012 - Jon White returns for followup. After startinng [name] he feels 80% better. On his spirometry his FEV1 has improved almost a liter from 1.1 L to 1.97 L/64%. He continue Symbicort. He still has some residual mucus production that is blotchy postnasal drip and yellow color early in the morning. He uses his nasal steroids diligently but does not use nasal saline spray regularly. He never used Nettie pot or hypertonic saline spray   - There no new issues and for the spring 2014 he plans to go to [place] as part of his  annual spring to summer visit  PFT  06/12/12 PFT FVC fev1 ratio BD fev1 TLC DLCO coments  06/12/2012  2.7L/70% 1.1L/42% 39(67) 38% 6.8L/114% 9.7/56% Copd with asthma component. Huge BD response  07/10/2012   3.8 L/92%   1.97 L/64%   52      almost 1 L improvement since starting  tudorza    #COPD  Continue symbicort 2 puff twice daily  tudorza 1 puff twice a day  Use albuterol as needed  I recommend you take N acetylcysteine 600 mg twice a day.  - There is  research from [place] published in [place] that shows if you takes N-acetylcysteine cuts down acute exacerbation COPD by 20%. The drug functions by being an anti-oxidant. This drug will not make Jon White feel any better but will make the chances of this having an exacerbation less likely The drug is cheap and is over-the-counter at [facility] vitamin store or [employer]. There are no side effects. I have experience with with the drug with pulmonary fibrosis patients so I recommend it strongly  REturn in October 2014 or sooner if needed  Ensure fu in [place] during summer 2014  #Sinus draingae  - use netti pot or 3% neill-med saline spray daily at night for nose  - continue other measures advised by Dr Benjamine Mola  #Folllowup  - Oct 2014     OV 03/19/2013  COPD/asthma,, pulmonary embolism, chronic rhinosinusitis followup  He is back from [place] for the winter. He has been doing well. He developed urinary retention again with the second anti-cholinergic inhaler namely tud orza. So he is discontinuing himself on Symbicort. No deterioration in respiratory symptoms. He's had a flu shot. Overall COPD stable and cat Score is 14  In terms of chronic rhinosinusitis this is unchanged. He is not fully compliant with his saline and nasal steroids.   Labs: for pulm embolism he continues on Coumadin. Last checked in June 2014 at our Coumadin clinic INR was therapeutic. He is been checking it in [place] and according to his history it  has been therapeutic. He plans to re\re register with our Coumadin clinic     04/09/2013 Follow up for COPD  Patient returns for a followup for COPD. Last visit. Patient was changed from [place] to [place] due to urinary retention on Tudorza  Patient says that he has felt improved with decreased shortness of breath. No flare in cough or shortness of breath. Patient denies any chest pain, orthopnea, PND, or leg swelling.  Patient denies any urinary retention or urinary urgency.  spirometry today shows FEV1 at  1.7 liters, 56% , ratio 52%   OV 07/09/2013  Chief Complaint  Patient presents with  . COPD    follow-up. Pt denies any increase in SOB or cough at this time.     Followup COPD:: Feno at Dr Remus Blake office 05/15/13 was 81 and low prob for eos asthma. He did not tolerate  ANORO DUE to double vision and is back on [place]  and Symbicort. He feels this combination was really well for him. COPD cat score is currently 12 and his symptoms are detailed below  Pulmonary embolism: In June 2015 it'll be 2 years and she suffered pulmonary embolus and following road from [place] to [place]. He is been compliant with Coumadin. Most recent INR 07/02/2013 is greater than 2. There no bleeding episodes. However he is frustrated taking anti-coagulation and would like to come off. He is agreeable to taking Coumadin for INR greater than 1.5 starting June 2015 to continue to keep the risk of recurrent PE low  Chronic rhinosinusitis:: This is due to a polyp. He is followed by ENT Dr. Elgie Congo. He did have recent prednisone burst and this really helped. He says that his quality-of-life is mostly restricted because of rhinosinusitis. However he is at baseline it is not any worse  Imaging:  Last CT chest 05/19/12  - IMPRESSION:  No evidence of pulmonary embolism.  Changes of COPD and prior right lower lobe surgery.  Stable 4 mm left  lower lobe nodule.  Question right renal cyst with 2 mm left kidney  stone and minimal  left hydronephrosis.  Original Report Authenticated By: Lavonia Dana, M.D.    Ok Edwards 10/23/2013  Chief Complaint  Patient presents with  . Follow-up    Pt c/o worsening SOB since last OV. C/o mild CP with deep breaths. c/o cough producing white and yellow mucous.     Followup COPD with BD response (low Feno at Dr Remus Blake office 05/15/13). He is  on [place]  and Symbicort. He feels this combination was really well for him. COPD CAT Score worse to 27 compared to baseline but this is because of ICU Stay due to duodenal ulcer perf in March/April 2015.   Pulmonary embolism: In June 2015 it'll be 2 years and she suffered pulmonary embolus and following road from [place] to [place]. He is been compliant with Coumadin. Most recent INR 07/02/2013 is greater than 2 on 10/09/13 . There no bleeding episodes but in march/april 2015 he suffered life threatening duodenal ulcer peforation. So though he is back on coumadin, he would prefer to come off. We agreed to check d-dimer: if normal will dc coumadin. If high, consider  taking Coumadin for INR greater than 1.5 starting June 2015 to continue to keep the risk of recurrent PE low  Chronic rhinosinusitis:: This is due to a polyp. He is followed by ENT Dr. Elgie Congo. Due to see him today   New issue is Fatigue: This is since hospital dc ffom duodenal ulcer perf. He has never had TSH and Vit D check based on EPIC review.  Iam unable to check Vit D due to "medicare limit:. He is willing to attend rehab for copd. He has finished home PT, OT, RN   #COPD  - Continuye your medications - refer pulmonary rehab at [place]  #Fatigue  - due to post ICU deconditioning  - check VIt D and TSH levels  - rehab for copd will help you  #PE treatment since June 2013 - CT Dec 2013 and March 2015 without evidence of clot  - check d-dimer levels; will call with results. IF normal, can stop coumadin.. - - If high, continue coumadin but will cut INR  goal down to > 1.5  #Sinus   - folloow with Dr Benjamine Mola  #Lung cancer screen  - no evidence of lung cancer as of march 2015 - consider CT chest summer 2016; will decide at followup  #FOllowup  2 months    OV 12/11/2013  Chief Complaint  Patient presents with  . Follow-up    Pt states the symbicort isn't helping like it use to. C/o DOE, cough with intermittent clear, yellow and white mucous production. Pt states he had chest soreness d/t coughing that lasted several days.    Followup  - COPD: he is on tudorza and symbicort. He finishes physical therapy after  ICU stay and he said that he was doing well after that but since they stopped his dyspnea is worse. His sputum volume appears more than usual by mild margin but the sputum color is good and white  and unchanged. There is no worsening cough. He is not so sure that isn't a COPD exacerbation but he feels that his Symbicort is not helping him anymore   - Pulmnaryh embolism: His INR goal is now greater than 1.5. We checked a d-dimer in June 2015 and it was still high. Which is completed over 2 years of treatment so  we reduced the INR goal greater than 1.5 but we opted to continue Coumadin   - New issue: He said his abdomen was hurting him I examined this he has new onset cellulitis around the abdominal incision wound along with the onset post drainage. He will see his surgeon today at 1:15 PM and I made sure that he got this acute appointment   01/09/2014 Follow up COPD  Returns for COPD follow up  Last ov was changed to [place] from symbicort.  Says he is feeling so much better. Feels that BREO is working very well.   Reports breathing is 75% improved since last ov.  DOE is near back to baseline since beginning the [place] Also had abd cellulitis , tx w/ abx -he says that has cleared up.  Remains on coumadin with INR goal 1.5-2.0 , doing well w/ no known bleeding.  No chest pain,orthopnea, edema or fever.     OV 03/13/2014  Chief  Complaint  Patient presents with  . Follow-up    Pt was recently admitted to hospital in [place] for perf gastric ulcers and MI. Pt states he is doing better. Pt c/o DOE, mild dry cough. Pt denies CP/tightness.     Followup COPD and pulmonary embolism   The COPD stable. Since I last saw him in July 2015 he did see my nurse practitioner in August 2015. Then just prior to Labor Day Monday he went to [place] to be with his family. Thursday before  Labor Day Monday he had a sudden syncopal episode at home. Taken to [facility] where he was diagnosed with non-STEMI and was placed on IV heparin by the following day and 2 days later had significant upper GI bleed that required resuscitation and with blood and emergent endoscopy which apparently showed multiple gastric bleeding ulcers. Cardiac cath was therefore canceled. He spent 9 days in the hospital and released. During this time he was not on the ventilator did not suffer renal injury did not have pneumonia. Since then he has not had any chest pain. COPD continues to be stable. During his hospitalization he was tried on different inhalers but apparently these produced allergies or hives and these allergies are documented. Currently stable. He is up-to-date with his flu shot. He has a cardiology appointment pending around Thanksgiving 2015 with Dr. Lauree Chandler  Of note due to the second GI bleed his anticoagulations has been discontinued even though he was taking Coumadin only for an INR goal greater than 1.5 based on a positive d-dimer predicting risk of future clot recurrence   07/17/14 Follow up COPD  Returns for 4 month follow up for COPD  Overall says at his baseline without flare of cough or wheezing.  Remains on Tudorza and Symbicort.  On ACE and Beta blocker  but denies flare of cough/wheezing  He denies any chest pain, orthopnea, PND, or increased leg swelling.   Review of Systems  Constitutional: Negative for fever and  unexpected weight change.  HENT: Negative for congestion, dental problem, ear pain, nosebleeds, postnasal drip, rhinorrhea, sinus pressure, sneezing, sore throat and trouble swallowing.   Eyes: Negative for redness and itching.  Respiratory: Positive for cough and shortness of breath. Negative for chest tightness and wheezing.   Cardiovascular: Negative for palpitations and leg swelling.  Gastrointestinal: Negative for nausea and vomiting.  Genitourinary: Negative for dysuria.  Musculoskeletal: Negative for joint swelling.  Skin: Negative for rash.  Neurological: Negative for headaches.  Hematological: Does not  bruise/bleed easily.  Psychiatric/Behavioral: Negative for dysphoric mood. The patient is not nervous/anxious.        Objective:   Physical Exam     General:  well developed, well nourished, in no acute distress Head:  normocephalic and atraumatic Eyes:  PERRLA/EOM intact; conjunctiva and sclera clear Ears:  TMs intact and clear with normal canals Nose:  no deformity, discharge, inflammation, or lesions Mouth:  dentures Melampatti Class II.   Neck:  no masses, thyromegaly, or abnormal cervical nodes Chest Wall:  no deformities noted Lungs: No wheezes Heard:  regular rate and rhythm, S1, S2 without murmurs, rubs, gallops, or clicks Abdomen:well healed surgical scar, mild residual scab, no redness  Msk:  no deformity or scoliosis noted with normal posture Pulses:  pulses normal Extremities:  no clubbing, cyanosis, edema, or deformity noted Neurologic:  CN II-XII grossly intact with normal reflexes, coordination, muscle strength and tone Skin:  intact without lesions or rashes Cervical Nodes:  no significant adenopathy Axillary Nodes:  no significant adenopathy Psych:  alert and cooperative; normal mood and affect; normal attention span and concentration      Assessment & Plan:  C

## 2014-07-17 NOTE — Patient Instructions (Signed)
Continue on Symbicort 2 puffs Twice daily   Continue tudorza 1 puff twice daily Follow up Dr. Chase Caller in 3-4  months and As needed

## 2014-07-22 NOTE — Assessment & Plan Note (Signed)
Compensated on present regimen  Plan  Continue on Symbicort 2 puffs Twice daily   Continue tudorza 1 puff twice daily Follow up Dr. Chase Caller in 3-4  months and As needed

## 2014-07-22 NOTE — Assessment & Plan Note (Signed)
Prev PE -off anticoagulation d/t recurrent GI bleed  No signs of acute VTE

## 2014-07-23 ENCOUNTER — Encounter: Payer: Self-pay | Admitting: Gastroenterology

## 2014-07-23 ENCOUNTER — Ambulatory Visit (INDEPENDENT_AMBULATORY_CARE_PROVIDER_SITE_OTHER): Payer: Medicare Other | Admitting: Gastroenterology

## 2014-07-23 VITALS — BP 98/58 | HR 64 | Ht 68.0 in | Wt 158.6 lb

## 2014-07-23 DIAGNOSIS — M79604 Pain in right leg: Secondary | ICD-10-CM

## 2014-07-23 DIAGNOSIS — M79601 Pain in right arm: Secondary | ICD-10-CM

## 2014-07-23 DIAGNOSIS — K269 Duodenal ulcer, unspecified as acute or chronic, without hemorrhage or perforation: Secondary | ICD-10-CM

## 2014-07-23 DIAGNOSIS — M79606 Pain in leg, unspecified: Secondary | ICD-10-CM | POA: Insufficient documentation

## 2014-07-23 NOTE — Assessment & Plan Note (Signed)
Plan Doppler ultrasound to rule out DVT

## 2014-07-23 NOTE — Patient Instructions (Addendum)
You have been scheduled for an endoscopy. Please follow written instructions given to you at your visit today. If you use inhalers (even only as needed), please bring them with you on the day of your procedure. Your physician has requested that you go to www.startemmi.com and enter the access code given to you at your visit today. This web site gives a general overview about your procedure. However, you should still follow specific instructions given to you by our office regarding your preparation for the procedure.  We will call you back when the Doppler Ultrasound is scheduled. Our radiology department is currently closed until 1:00pm.       The Doppler Ultrasound was scheduled for 07/23/2014 at 1:30pm at [facility] [phone]   Patient informed of appointment and directions

## 2014-07-23 NOTE — Assessment & Plan Note (Signed)
Plan follow-up endoscopy to determine whether ulcers is healed.  This will done prior to catheterization with anticipated anticoagulation therapy

## 2014-07-23 NOTE — Progress Notes (Signed)
      History of Present Illness:  Mr. Jain has returned for follow-up of duodenal ulcer.  This was noted on last endoscopy.  He has occasional nausea.  He denies upper abdominal pain.  He has occasional right lower quadrant pain.  Demonstrates severe diverticulosis.  Exam had to be completed by barium enema because of obstruction to the colonoscope.  Patient is scheduled for cardiac catheterization.  Endoscopy was requested to determine whether this still an ulcer since it is anticipated that he will be placed on anticoagulation therapy.  Mr. Savarese also complains of some soreness and pain in his right calf.  There is no swelling.    Review of Systems: Pertinent positive and negative review of systems were noted in the above HPI section. All other review of systems were otherwise negative.    Current Medications, Allergies, Past Medical History, Past Surgical History, Family History and Social History were reviewed in [facility] record  Vital signs were reviewed in today's medical record. Physical Exam: General: Well developed , well nourished, no acute distress Skin: anicteric Head: Normocephalic and atraumatic Eyes:  sclerae anicteric, EOMI Ears: Normal auditory acuity Mouth: No deformity or lesions Lungs: Clear throughout to auscultation Heart: Regular rate and rhythm; no murmurs, rubs or bruits Abdomen: Soft, non tender and non distended. No masses, hepatosplenomegaly or hernias noted. Normal Bowel sounds Rectal:deferred Musculoskeletal: Symmetrical with no gross deformities  Pulses:  Normal pulses noted Extremities: No clubbing, cyanosis, edema or deformities noted.  As a questionable superficial cord in the right calf medially Neurological: Alert oriented x 4, grossly nonfocal Psychological:  Alert and cooperative. Normal mood and affect  See Assessment and Plan under Problem List

## 2014-07-24 ENCOUNTER — Encounter: Payer: Self-pay | Admitting: Physician Assistant

## 2014-07-24 ENCOUNTER — Ambulatory Visit (HOSPITAL_COMMUNITY)
Admission: RE | Admit: 2014-07-24 | Discharge: 2014-07-24 | Disposition: A | Discharge status: Home / Self Care | Payer: Medicare Other | Source: Ambulatory Visit | Attending: Cardiology | Admitting: Cardiology

## 2014-07-24 ENCOUNTER — Telehealth: Payer: Self-pay

## 2014-07-24 DIAGNOSIS — I2510 Atherosclerotic heart disease of native coronary artery without angina pectoris: Secondary | ICD-10-CM | POA: Diagnosis not present

## 2014-07-24 DIAGNOSIS — M79601 Pain in right arm: Secondary | ICD-10-CM | POA: Diagnosis not present

## 2014-07-24 DIAGNOSIS — Z1389 Encounter for screening for other disorder: Secondary | ICD-10-CM | POA: Diagnosis not present

## 2014-07-24 DIAGNOSIS — I824Z1 Acute embolism and thrombosis of unspecified deep veins of right distal lower extremity: Secondary | ICD-10-CM | POA: Insufficient documentation

## 2014-07-24 DIAGNOSIS — M79604 Pain in right leg: Secondary | ICD-10-CM

## 2014-07-24 DIAGNOSIS — Z125 Encounter for screening for malignant neoplasm of prostate: Secondary | ICD-10-CM | POA: Diagnosis not present

## 2014-07-24 DIAGNOSIS — E782 Mixed hyperlipidemia: Secondary | ICD-10-CM | POA: Diagnosis not present

## 2014-07-24 DIAGNOSIS — R7301 Impaired fasting glucose: Secondary | ICD-10-CM | POA: Diagnosis not present

## 2014-07-24 DIAGNOSIS — Z0000 Encounter for general adult medical examination without abnormal findings: Secondary | ICD-10-CM | POA: Diagnosis not present

## 2014-07-24 DIAGNOSIS — I82811 Embolism and thrombosis of superficial veins of right lower extremities: Secondary | ICD-10-CM

## 2014-07-24 NOTE — Telephone Encounter (Signed)
Patient positive for a superficial clot in the calf. They will explain to the patient.

## 2014-07-24 NOTE — Progress Notes (Signed)
Patient ID: Jon White, male   DOB: Aug 21, 1932, 81 y.o.   MRN: [id number]   Patient was briefly seen after having lower extremity venous Dopplers which revealed a superficial right thrombus.  He was on chronic Coumadin therapy prior to September 2015 for PE/DVT and this will need to be resumed, if possible, once cleared by Dr. Deatra Ina.  Consider for IVC filter as an alternative.  Ethelyn Cerniglia, PAC

## 2014-07-24 NOTE — Progress Notes (Addendum)
Right Lower Extremity Venous Duplex Completed. Evidence for superficial venous thrombosis in a branch off of the greater saphenous vein in the medial calf. There is a palpable cord noted where the clot is located.  [place]

## 2014-07-29 NOTE — Progress Notes (Signed)
Jon White, The lower extremity dopplers were ordered by primary care? I haven't seen him since October so I am not sure what our degree of involvement is here with treatment of his DVT.   Gerald Stabs

## 2014-07-30 DIAGNOSIS — I2510 Atherosclerotic heart disease of native coronary artery without angina pectoris: Secondary | ICD-10-CM | POA: Diagnosis not present

## 2014-07-30 DIAGNOSIS — E782 Mixed hyperlipidemia: Secondary | ICD-10-CM | POA: Diagnosis not present

## 2014-07-30 DIAGNOSIS — N183 Chronic kidney disease, stage 3 (moderate): Secondary | ICD-10-CM | POA: Diagnosis not present

## 2014-07-30 DIAGNOSIS — I214 Non-ST elevation (NSTEMI) myocardial infarction: Secondary | ICD-10-CM | POA: Diagnosis not present

## 2014-07-30 DIAGNOSIS — Z23 Encounter for immunization: Secondary | ICD-10-CM | POA: Diagnosis not present

## 2014-07-31 ENCOUNTER — Telehealth: Payer: Self-pay | Admitting: Cardiovascular Disease

## 2014-07-31 NOTE — Telephone Encounter (Signed)
Spoke with pt. He is scheduled for an endoscopy at the end of the month and is asking if appt with Dr. Angelena White on August 06, 2014 should be postponed until after endo.  I told pt to keep scheduled appt on August 06, 2014.  He also reports dry cough for last month and states pulmonary told him it could be related to lisinopril.  Pt will discuss with Dr. Angelena White at upcoming office visit.  Pt also recently diagnosed with superficial right thrombus by venous doppler.  Pt states he has reviewed this with his primary care doctor--Dr. Ashby White.

## 2014-07-31 NOTE — Telephone Encounter (Signed)
New message     Talk to the nurse regarding his test scheduled with the GI doctor.  He is having a "scope" look down his throat.

## 2014-07-31 NOTE — Telephone Encounter (Signed)
Agree. cdm 

## 2014-08-05 NOTE — Progress Notes (Signed)
History of Present Illness: 81 yo male with history of PE/DVT on chronic coumadin therapy prior to September 2015, COPD, prostate cancer, OSA, HLD, chronic kidney disease who is here today for cardiac follow up. He was seen as a new patient 03/25/14 for cardiac evaluation. He was in [place] September 2015 and had syncopal episode. He felt dizzy before the episode. No palpitations. He was taken to a [facility] where he was diagnosed with non-STEMI, coumadin was held and he was placed on IV heparin. 2 days later had significant upper GI bleed that required resuscitation with blood and emergent endoscopy which showed multiple bleeding gastric ulcers. Cardiac cath was canceled due to the GI bleeding. He also had runs of non-sustained VT and was placed on amiodarone but this was changed to metoprolol before discharge. He spent 9 days in the hospital and released on 02/15/14. Since then he has not had any chest pain. Echo 02/08/14 from [facility] with LVEF=40-45%. I arranged a cardiac cath after his first visit but he cancelled this pending GI workup which was requested by his family. EGD 04/03/14 with active 26mm ulcer at the duodenal bulb. Otherwise EGD was normal. Severe sigmoid diverticulosis noted on colonoscopy. Barium enema with diverticulosis but no strictures or extravasation. He has been on no anti-coagulation. ASA causes hives.   He is here today for follow up. He is feeling great. No chest pain or SOB. No black stools or coffee ground emesis. Taking Nexium BID. Recently found to have superficial thrombus in the right calf. Also having dry cough on Ace-inh.   Primary Care Physician: Rogelia Mire  Past Medical History  Diagnosis Date  . Prostate cancer 1992    in remission.  s/p XRT  . Emphysema   . Allergic rhinitis   . Asthma   . COPD (chronic obstructive pulmonary disease)   . Recurrent upper respiratory infection (URI)     coughing from chronic sinusitis   . Normal cardiac stress  test     reports stress test was several yrs. ago /w [employer]. Dr. Rogelia Mire, WNL   . OSA (obstructive sleep apnea) 2007    CPAP- report of settings on paper chart, seen by [facility] - seen 07/2011   . Renal stone     some passed spontaneous, & also had cystoscopy  . Arthritis     hands  . Neuromuscular disorder     reports shaking, somedays "can't hold spoon" - generalized  shakiness, is going to talk to MD about it at next appt.   . High cholesterol   . Shortness of breath on exertion   . H/O hiatal hernia   . Osteoarthritis, generalized   . Chronic renal insufficiency, stage III (moderate)   . Mixed hyperlipidemia   . PE (pulmonary embolism) 10/2011  . Bleeding gastric ulcer     Past Surgical History  Procedure Laterality Date  . Lung surgery  1986    presumed from RLL. Segmentectomy. Benign Nodule.   . Sinus endo w/fusion  sch. 09/15/2011    Procedure: ENDOSCOPIC SINUS SURGERY WITH FUSION NAVIGATION;  Surgeon: Ascencion Dike, MD;  Location: [place];  Service: ENT;  Laterality: Bilateral;  bilateral maxillary antrostomy, bilateral endoscopic ethmoidectomy, bilateral sphenoidectomy, bilateral frontal recess exploration  . Septoplasty  09/15/11  . Cataract extraction w/ intraocular lens  implant, bilateral  ~ 2000  . Ureteroscopy  1980's  . Sinus endo w/fusion  09/15/2011    Procedure: ENDOSCOPIC SINUS  SURGERY WITH FUSION NAVIGATION;  Surgeon: Ascencion Dike, MD;  Location: [place];  Service: ENT;  Laterality: Bilateral;  . Maxillary antrostomy  09/15/2011    Procedure: MAXILLARY ANTROSTOMY;  Surgeon: Ascencion Dike, MD;  Location: [facility] OR;  Service: ENT;  Laterality: Bilateral;  TOTAL MAXILLARY ANTROSTOMY  . Sinus exploration  09/15/2011    Procedure: SINUS EXPLORATION;  Surgeon: Ascencion Dike, MD;  Location[facility];  Service: ENT;  Laterality: Bilateral;  FRONTAL RECESS EXPLORATION  . Sphenoidectomy  09/15/2011    Procedure: Coralee Pesa;  Surgeon: Ascencion Dike, MD;  Location: [place];   Service: ENT;  Laterality: Bilateral;  . Septoplasty  09/15/2011    Procedure: SEPTOPLASTY;  Surgeon: Ascencion Dike, MD;  Location: [place];  Service: ENT;  Laterality: Bilateral;  . Laparotomy N/A 08/28/2013    Procedure: EXPLORATORY LAPAROTOMY;  Surgeon: Rolm Bookbinder, MD;  Location: [place];  Service: General;  Laterality: N/A;  . Repair of perforated ulcer N/A 08/28/2013    Procedure: DUODENAL ULCER PATCH;  Surgeon: Rolm Bookbinder, MD;  Location: [place];  Service: General;  Laterality: N/A;  . Gastrostomy N/A 08/28/2013    Procedure: GASTROSTOMY;  Surgeon: Rolm Bookbinder, MD;  Location: [place];  Service: General;  Laterality: N/A;  . Esophagogastroduodenoscopy N/A 08/28/2013    Procedure: ESOPHAGOGASTRODUODENOSCOPY (EGD);  Surgeon: Gayland Curry, MD;  Location: [place];  Service: General;  Laterality: N/A;  . Melanoma excision Left     under left eye  . Esophagogastroduodenoscopy N/A 04/03/2014    Procedure: ESOPHAGOGASTRODUODENOSCOPY (EGD);  Surgeon: Inda Castle, MD;  Location: [facility];  Service: Endoscopy;  Laterality: N/A;  . Colonoscopy N/A 04/03/2014    Procedure: COLONOSCOPY;  Surgeon: Inda Castle, MD;  Location: [facility];  Service: Endoscopy;  Laterality: N/A;    Current Outpatient Prescriptions  Medication Sig Dispense Refill  . Aclidinium Bromide (TUDORZA PRESSAIR) 400 MCG/ACT AEPB Inhale 1 puff into the lungs 2 (two) times daily. 1 each 5  . albuterol (VENTOLIN HFA) 108 (90 BASE) MCG/ACT inhaler Inhale 2 puffs into the lungs every 6 (six) hours as needed for wheezing or shortness of breath. (Patient not taking: Reported on 08/06/2014) 1 Inhaler 2  . atorvastatin (LIPITOR) 10 MG tablet Take 10 mg by mouth daily.     . budesonide-formoterol (SYMBICORT) 160-4.5 MCG/ACT inhaler Inhale 2 puffs into the lungs 2 (two) times daily.    [name] Kitchen esomeprazole (NEXIUM) 40 MG capsule Take 1 capsule (40 mg total) by mouth 2 (two) times daily before a meal. 60 capsule 11  . fluocinonide  cream (LIDEX) 2.99 % Apply 1 application topically daily as needed (rash). To infected area.    . fluticasone (FLONASE) 50 MCG/ACT nasal spray Place 1 spray into the nose 2 (two) times daily. Each nostril once a day.    . ketoconazole (NIZORAL) 2 % shampoo Apply 1 application topically 2 (two) times a week.     [name] Kitchen lisinopril (PRINIVIL,ZESTRIL) 2.5 MG tablet Take 1 tablet (2.5 mg total) by mouth daily. 30 tablet 6  . loratadine (CLARITIN) 10 MG tablet Take 10 mg by mouth daily.    . metoprolol tartrate (LOPRESSOR) 25 MG tablet Take 0.5 tablets (12.5 mg total) by mouth 2 (two) times daily. 30 tablet 6  . nitroGLYCERIN (NITROSTAT) 0.4 MG SL tablet Place 0.4 mg under the tongue every 5 (five) minutes as needed for chest pain.    . phenol (CHLORASEPTIC) 1.4 % LIQD Use as directed 1 spray in  the mouth or throat as needed for throat irritation / pain.     No current facility-administered medications for this visit.    Allergies  Allergen Reactions  . Mometasone Furoate Other (See Comments)    Nasonex: CAUSED RED,ITCHY EYES; "lost my eyelashes"   . Naproxen Sodium Nausea And Vomiting    "thought I was going to die"  . Nsaids Other (See Comments)    Stomach ulcers  . Anoro Ellipta [Umeclidinium-Vilanterol]     Double vision  . Aspirin Hives and Nausea And Vomiting     Chills, sweats  . Breo Ellipta [Fluticasone Furoate-Vilanterol] Hives  . Omnaris [Ciclesonide] Other (See Comments)    Nose bleed   . Spiriva Handihaler [Tiotropium Bromide Monohydrate] Other (See Comments)    Hiccups     History   Social History  . Marital Status: Married    Spouse Name: N/A  . Number of Children: 4  . Years of Education: N/A   Occupational History  . 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     Social History Main Topics  . Smoking status: Former Smoker -- 1.00 packs/day for 60 years    Types: Cigarettes    Quit date: 08/06/2011  . Smokeless tobacco: Never Used  . Alcohol Use: 0.0 oz/week     Comment: rarely    . Drug Use: No  . Sexual Activity: No   Other Topics Concern  . Not on file   Social History Narrative   Traveled to [place].   Married and lives with wife.   Originally from [place].   Spends winters in [place]   Has 2 kids in [place]          Family History  Problem Relation Age of Onset  . Prostate cancer Brother   . Prostate cancer Brother   . Prostate cancer Brother   . Asthma Father   . Heart disease Mother     MI age 12  . Anesthesia problems Neg Hx   . Heart attack Mother   . Stroke Sister     Review of Systems:  As stated in the HPI and otherwise negative.   BP 133/70 mmHg  Pulse 60  Ht 5\' 8"  (1.727 m)  Wt 163 lb (73.936 kg)  BMI 24.79 kg/m2  Physical Examination: General: Well developed, well nourished, NAD HEENT: OP clear, mucus membranes moist SKIN: warm, dry. No rashes. Neuro: No focal deficits Musculoskeletal: Muscle strength 5/5 all ext Psychiatric: Mood and affect normal Neck: No JVD, no carotid bruits, no thyromegaly, no lymphadenopathy. Lungs:Clear bilaterally, no wheezes, rhonci, crackles Cardiovascular: Regular rate and rhythm. No murmurs, gallops or rubs. Abdomen:Soft. Bowel sounds present. Non-tender.  Extremities: No lower extremity edema. Pulses are 2 + in the bilateral DP/PT.  Assessment and Plan:   1. NSTEMI/CAD: Recent NSTEMI in [place] 9/15 but cath delayed due to GI bleeding, gastric ulcers. Recent GI workup here locally per Dr. Deatra Ina with ulcer noted duodenum and evidence of non-bleeding diverticulosis. He has plans for repeat EGD later this month. No urgency for cath as he is feeling well without chest pain. Continue statin and beta blocker. Will change LIsinopril to Cozaar 50 mg daily since he has a cough. He will call with change in clinical status. May consider stress test instead of cath but will discuss at f/u.   2. HLD: continue statin.   3. Superficial venous thrombus: No indication for anti-coagulation.

## 2014-08-06 ENCOUNTER — Encounter: Payer: Self-pay | Admitting: Cardiovascular Disease

## 2014-08-06 ENCOUNTER — Ambulatory Visit (INDEPENDENT_AMBULATORY_CARE_PROVIDER_SITE_OTHER): Payer: Medicare Other | Admitting: Cardiovascular Disease

## 2014-08-06 VITALS — BP 133/70 | HR 60 | Ht 68.0 in | Wt 163.0 lb

## 2014-08-06 DIAGNOSIS — E785 Hyperlipidemia, unspecified: Secondary | ICD-10-CM | POA: Diagnosis not present

## 2014-08-06 DIAGNOSIS — I214 Non-ST elevation (NSTEMI) myocardial infarction: Secondary | ICD-10-CM | POA: Diagnosis not present

## 2014-08-06 MED ORDER — LOSARTAN POTASSIUM 50 MG PO TABS: 50.0000 mg | ORAL_TABLET | Freq: Every day | ORAL | Status: DC

## 2014-08-06 NOTE — Patient Instructions (Addendum)
Your physician recommends that you schedule a follow-up appointment in: 3 months.--Scheduled for November 07, 2014 at 10:15  Your physician has recommended you make the following change in your medication:   Stop Lisinopril.  Start Cozaar 50 mg by mouth daily.

## 2014-08-09 DIAGNOSIS — L219 Seborrheic dermatitis, unspecified: Secondary | ICD-10-CM | POA: Diagnosis not present

## 2014-08-09 DIAGNOSIS — D0430 Carcinoma in situ of skin of unspecified part of face: Secondary | ICD-10-CM | POA: Diagnosis not present

## 2014-08-16 ENCOUNTER — Other Ambulatory Visit: Payer: Self-pay | Admitting: General Surgery

## 2014-08-16 DIAGNOSIS — R1031 Right lower quadrant pain: Secondary | ICD-10-CM | POA: Diagnosis not present

## 2014-08-16 DIAGNOSIS — R109 Unspecified abdominal pain: Secondary | ICD-10-CM | POA: Diagnosis not present

## 2014-08-20 ENCOUNTER — Ambulatory Visit
Admission: RE | Admit: 2014-08-20 | Discharge: 2014-08-20 | Disposition: A | Discharge status: Home / Self Care | Payer: Medicare Other | Source: Ambulatory Visit | Attending: General Surgery | Admitting: General Surgery

## 2014-08-20 DIAGNOSIS — K5730 Diverticulosis of large intestine without perforation or abscess without bleeding: Secondary | ICD-10-CM | POA: Diagnosis not present

## 2014-08-20 DIAGNOSIS — N200 Calculus of kidney: Secondary | ICD-10-CM | POA: Diagnosis not present

## 2014-08-20 DIAGNOSIS — Z8546 Personal history of malignant neoplasm of prostate: Secondary | ICD-10-CM | POA: Diagnosis not present

## 2014-08-20 DIAGNOSIS — R1031 Right lower quadrant pain: Secondary | ICD-10-CM | POA: Diagnosis not present

## 2014-08-29 ENCOUNTER — Ambulatory Visit (AMBULATORY_SURGERY_CENTER): Payer: Medicare Other | Admitting: Gastroenterology

## 2014-08-29 ENCOUNTER — Encounter: Payer: Self-pay | Admitting: Gastroenterology

## 2014-08-29 VITALS — BP 140/74 | HR 51 | Temp 97.4°F | Resp 20 | Ht 68.0 in | Wt 158.0 lb

## 2014-08-29 DIAGNOSIS — K269 Duodenal ulcer, unspecified as acute or chronic, without hemorrhage or perforation: Secondary | ICD-10-CM | POA: Diagnosis not present

## 2014-08-29 DIAGNOSIS — K255 Chronic or unspecified gastric ulcer with perforation: Secondary | ICD-10-CM | POA: Diagnosis not present

## 2014-08-29 DIAGNOSIS — K319 Disease of stomach and duodenum, unspecified: Secondary | ICD-10-CM | POA: Diagnosis not present

## 2014-08-29 DIAGNOSIS — G4733 Obstructive sleep apnea (adult) (pediatric): Secondary | ICD-10-CM | POA: Diagnosis not present

## 2014-08-29 DIAGNOSIS — J449 Chronic obstructive pulmonary disease, unspecified: Secondary | ICD-10-CM | POA: Diagnosis not present

## 2014-08-29 MED ORDER — SODIUM CHLORIDE 0.9 % IV SOLN: 500.0000 mL | INTRAVENOUS | Status: DC

## 2014-08-29 MED ORDER — DEXLANSOPRAZOLE 60 MG PO CPDR: 60.0000 mg | DELAYED_RELEASE_CAPSULE | Freq: Every day | ORAL | Status: DC

## 2014-08-29 NOTE — Progress Notes (Signed)
Report to PACU, RN, vss, BBS= Clear.  

## 2014-08-29 NOTE — Patient Instructions (Addendum)

## 2014-08-29 NOTE — Progress Notes (Signed)
Called to room to assist during endoscopic procedure.  Patient ID and intended procedure confirmed with present staff. Received instructions for my participation in the procedure from the performing physician.  

## 2014-08-29 NOTE — Op Note (Addendum)
[place]  [address] [place], [address]   ENDOSCOPY PROCEDURE REPORT  PATIENT: Jon, White  MR#: [id number] BIRTHDATE: February 15, 1933 , 28  yrs. old GENDER: male ENDOSCOPIST: Inda Castle, MD REFERRED BY: PROCEDURE DATE:  08/29/2014 PROCEDURE:  EGD w/ biopsy ASA CLASS:     Class III INDICATIONS:  History of bleeding duodenal ulcer.  Evaluation prior to cardiac catheterization and probable need for anticoagulation therapy. MEDICATIONS: Monitored anesthesia care, Propofol 100 mg IV, and Lidocaine 100 mg IV TOPICAL ANESTHETIC:  DESCRIPTION OF PROCEDURE: After the risks benefits and alternatives of the procedure were thoroughly explained, informed consent was obtained.  The LB [id number] endoscope was introduced through the mouth and advanced to the second portion of the duodenum , Without limitations.  The instrument was slowly withdrawn as the mucosa was fully examined.    DUODENUM: A single  non-bleeding, clean-based and shallow mostly healed ulcer measuring 10 x 22mm in size, was found in the duodenal bulb.  Biopsies were taken at edge of the ulcer.   Except for the findings listed, the EGD was otherwise normal.         The scope was then withdrawn from the patient and the procedure completed.  COMPLICATIONS: There were no immediate complications.  ENDOSCOPIC IMPRESSION: partially healed duodenal ulcer  RECOMMENDATIONS: Await biopsy results switched from nexium to dexilant 60 mg daily  REPEAT EXAM: EGD 6-8 weeks (prior to cardiac cath)  eSigned:  Inda Castle, MD 08/29/2014 3:17 PM Revised: 08/29/2014 3:17 PM   CC: Jacky Kindle, MD, Merrilee Seashore, MD

## 2014-08-29 NOTE — Progress Notes (Signed)
Pt has red splotches on bilateral arms and legs that have been there " for a long time".  Reported to [facility] Monday, CRNA.  Also Josh advised that pt has albuterol inhaler in his right pant pocket and his nitro in his left pant pocket.  maw

## 2014-08-30 ENCOUNTER — Telehealth: Payer: Self-pay | Admitting: *Deleted

## 2014-08-30 NOTE — Telephone Encounter (Signed)
No answer. No identifier. Message left to call if questions or concerns. 

## 2014-09-03 ENCOUNTER — Encounter: Payer: Self-pay | Admitting: Gastroenterology

## 2014-09-16 DIAGNOSIS — J310 Chronic rhinitis: Secondary | ICD-10-CM | POA: Diagnosis not present

## 2014-09-16 DIAGNOSIS — J330 Polyp of nasal cavity: Secondary | ICD-10-CM | POA: Diagnosis not present

## 2014-09-23 DIAGNOSIS — R109 Unspecified abdominal pain: Secondary | ICD-10-CM | POA: Diagnosis not present

## 2014-09-26 ENCOUNTER — Ambulatory Visit (AMBULATORY_SURGERY_CENTER): Payer: Self-pay | Admitting: *Deleted

## 2014-09-26 VITALS — Ht 68.0 in | Wt 165.6 lb

## 2014-09-26 DIAGNOSIS — K269 Duodenal ulcer, unspecified as acute or chronic, without hemorrhage or perforation: Secondary | ICD-10-CM

## 2014-09-26 NOTE — Progress Notes (Signed)
Denies allergies to eggs or soy products. Denies complications with sedation or anesthesia. Denies O2 use. Denies use of diet or weight loss medications.    

## 2014-09-30 DIAGNOSIS — L820 Inflamed seborrheic keratosis: Secondary | ICD-10-CM | POA: Diagnosis not present

## 2014-09-30 DIAGNOSIS — L570 Actinic keratosis: Secondary | ICD-10-CM | POA: Diagnosis not present

## 2014-09-30 DIAGNOSIS — D0439 Carcinoma in situ of skin of other parts of face: Secondary | ICD-10-CM | POA: Diagnosis not present

## 2014-09-30 DIAGNOSIS — L219 Seborrheic dermatitis, unspecified: Secondary | ICD-10-CM | POA: Diagnosis not present

## 2014-10-10 ENCOUNTER — Encounter: Payer: Self-pay | Admitting: Gastroenterology

## 2014-10-10 ENCOUNTER — Ambulatory Visit (AMBULATORY_SURGERY_CENTER): Payer: Medicare Other | Admitting: Gastroenterology

## 2014-10-10 VITALS — BP 103/71 | HR 46 | Temp 96.2°F | Resp 37 | Ht 68.0 in | Wt 165.0 lb

## 2014-10-10 DIAGNOSIS — N189 Chronic kidney disease, unspecified: Secondary | ICD-10-CM | POA: Diagnosis not present

## 2014-10-10 DIAGNOSIS — K259 Gastric ulcer, unspecified as acute or chronic, without hemorrhage or perforation: Secondary | ICD-10-CM | POA: Diagnosis not present

## 2014-10-10 DIAGNOSIS — K222 Esophageal obstruction: Secondary | ICD-10-CM | POA: Diagnosis not present

## 2014-10-10 DIAGNOSIS — G4730 Sleep apnea, unspecified: Secondary | ICD-10-CM | POA: Diagnosis not present

## 2014-10-10 DIAGNOSIS — J449 Chronic obstructive pulmonary disease, unspecified: Secondary | ICD-10-CM | POA: Diagnosis not present

## 2014-10-10 DIAGNOSIS — K269 Duodenal ulcer, unspecified as acute or chronic, without hemorrhage or perforation: Secondary | ICD-10-CM

## 2014-10-10 MED ORDER — SODIUM CHLORIDE 0.9 % IV SOLN: 500.0000 mL | INTRAVENOUS | Status: DC

## 2014-10-10 NOTE — Op Note (Signed)
[name]. [place], [address]   ENDOSCOPY PROCEDURE REPORT  PATIENT: Jon White, Jon White  MR#: [id number] BIRTHDATE: [birth year] , 67  yrs. old GENDER: male ENDOSCOPIST: Inda Castle, MD REFERRED BY:  Verl Bangs, M.D. PROCEDURE DATE:  10/10/2014 PROCEDURE:  EGD, diagnostic and Maloney dilation of esophagus ASA CLASS:     Class III INDICATIONS:  dysphagia and surveillance of duodenal ulcer MEDICATIONS: Monitored anesthesia care and Propofol 180 mg IV TOPICAL ANESTHETIC:  DESCRIPTION OF PROCEDURE: After the risks benefits and alternatives of the procedure were thoroughly explained, informed consent was obtained.  The LB [id number] endoscope was introduced through the mouth and advanced to the second portion of the duodenum , Without limitations.  The instrument was slowly withdrawn as the mucosa was fully examined.    The area of previous ulceration of the duodenal bulb was healed. The mucosa was slightly pale.  The site of a graft was also seen in the duodenal bulb.  Slight narrowing of the apex of the bulb but allowed passage of the duodenal scope.   Except for the findings listed, the EGD was otherwise normal. .  A small hiatal hernia was present. Retroflexed views revealed no abnormalities.     The scope was then withdrawn from the patient.  A #54 Isabell Jarvis dilator was passed with mild resistance.  There was no heme.  COMPLICATIONS: There were no immediate complications.  ENDOSCOPIC IMPRESSION: 1.  healed duodenal ulcer 2.  Slight stricture at the apex of the duodenal bulb 3.  Esophageal stricture?"status post Venia Minks dilation  RECOMMENDATIONS: Continue PPI  REPEAT EXAM:  eSigned:  Inda Castle, MD 10/10/2014 2:07 PM    CC:

## 2014-10-10 NOTE — Patient Instructions (Signed)
YOU HAD AN ENDOSCOPIC PROCEDURE TODAY AT [facility]:   Refer to the procedure report that was given to you for any specific questions about what was found during the examination.  If the procedure report does not answer your questions, please call your gastroenterologist to clarify.  If you requested that your care partner not be given the details of your procedure findings, then the procedure report has been included in a sealed envelope for you to review at your convenience later.  YOU SHOULD EXPECT: Some feelings of bloating in the abdomen. Passage of more gas than usual.  Walking can help get rid of the air that was put into your GI tract during the procedure and reduce the bloating. If you had a lower endoscopy (such as a colonoscopy or flexible sigmoidoscopy) you may notice spotting of blood in your stool or on the toilet paper. If you underwent a bowel prep for your procedure, you may not have a normal bowel movement for a few days.  Please Note:  You might notice some irritation and congestion in your nose or some drainage.  This is from the oxygen used during your procedure.  There is no need for concern and it should clear up in a day or so.  SYMPTOMS TO REPORT IMMEDIATELY:   Following upper endoscopy (EGD)  Vomiting of blood or coffee ground material  New chest pain or pain under the shoulder blades  Painful or persistently difficult swallowing  New shortness of breath  Fever of 100F or higher  Black, tarry-looking stools  For urgent or emergent issues, a gastroenterologist can be reached at any hour by calling [phone].   DIET: Your first meal following the procedure should be a small meal and then it is ok to progress to your normal diet. Heavy or fried foods are harder to digest and may make you feel nauseous or bloated.  Likewise, meals heavy in dairy and vegetables can increase bloating.  Drink plenty of fluids but you should avoid alcoholic beverages for  24 hours.  ACTIVITY:  You should plan to take it easy for the rest of today and you should NOT DRIVE or use heavy machinery until tomorrow (because of the sedation medicines used during the test).    FOLLOW UP: Our staff will call the number listed on your records the next business day following your procedure to check on you and address any questions or concerns that you may have regarding the information given to you following your procedure. If we do not reach you, we will leave a message.  However, if you are feeling well and you are not experiencing any problems, there is no need to return our call.  We will assume that you have returned to your regular daily activities without incident.  If any biopsies were taken you will be contacted by phone or by letter within the next 1-3 weeks.  Please call us at [phone] if you have not heard about the biopsies in 3 weeks.    SIGNATURES/CONFIDENTIALITY: You and/or your care partner have signed paperwork which will be entered into your electronic medical record.  These signatures attest to the fact that that the information above on your After Visit Summary has been reviewed and is understood.  Full responsibility of the confidentiality of this discharge information lies with you and/or your care-partner.  Please review dilation diet and stricture handout provided.

## 2014-10-10 NOTE — Progress Notes (Signed)
To recovery, report to Myers, RN, VSS. 

## 2014-10-10 NOTE — Progress Notes (Signed)
Called to room to assist during endoscopic procedure.  Patient ID and intended procedure confirmed with present staff. Received instructions for my participation in the procedure from the performing physician.  

## 2014-10-11 ENCOUNTER — Telehealth: Payer: Self-pay | Admitting: *Deleted

## 2014-10-11 NOTE — Telephone Encounter (Signed)
  Follow up Call-  Call back number 10/10/2014 08/29/2014  Post procedure Call Back phone  # [phone] hm  Permission to leave phone message Yes Yes     Patient questions:  Do you have a fever, pain , or abdominal swelling? No. Pain Score  0 *  Have you tolerated food without any problems? Yes.    Have you been able to return to your normal activities? No.  Do you have any questions about your discharge instructions: Diet   No. Medications  No. Follow up visit  No.  Do you have questions or concerns about your Care? No.  Actions: * If pain score is 4 or above: No action needed, pain <4.

## 2014-10-23 ENCOUNTER — Ambulatory Visit (INDEPENDENT_AMBULATORY_CARE_PROVIDER_SITE_OTHER): Payer: Medicare Other | Admitting: Internal Medicine

## 2014-10-23 ENCOUNTER — Telehealth: Payer: Self-pay | Admitting: Internal Medicine

## 2014-10-23 ENCOUNTER — Other Ambulatory Visit (INDEPENDENT_AMBULATORY_CARE_PROVIDER_SITE_OTHER): Payer: Medicare Other

## 2014-10-23 ENCOUNTER — Encounter: Payer: Self-pay | Admitting: Internal Medicine

## 2014-10-23 VITALS — BP 110/68 | HR 58 | Ht 68.0 in | Wt 165.8 lb

## 2014-10-23 DIAGNOSIS — J449 Chronic obstructive pulmonary disease, unspecified: Secondary | ICD-10-CM | POA: Diagnosis not present

## 2014-10-23 DIAGNOSIS — R0602 Shortness of breath: Secondary | ICD-10-CM

## 2014-10-23 DIAGNOSIS — I2699 Other pulmonary embolism without acute cor pulmonale: Secondary | ICD-10-CM

## 2014-10-23 LAB — BASIC METABOLIC PANEL
BUN: 25 mg/dL — ABNORMAL HIGH (ref 6–23)
CALCIUM: 9.5 mg/dL (ref 8.4–10.5)
CO2: 29 mEq/L (ref 19–32)
CREATININE: 1.54 mg/dL — AB (ref 0.40–1.50)
Chloride: 103 mEq/L (ref 96–112)
GFR: 46.46 mL/min — AB (ref >60.00)
Glucose, Bld: 87 mg/dL (ref 70–99)
POTASSIUM: 5.1 meq/L (ref 3.5–5.1)
Sodium: 139 mEq/L (ref 135–145)

## 2014-10-23 LAB — CBC
HCT: 43.4 % (ref 39.0–52.0)
Hemoglobin: 14.1 g/dL (ref 13.0–17.0)
MCHC: 32.5 g/dL (ref 30.0–36.0)
MCV: 81.2 fl (ref 78.0–100.0)
PLATELETS: 241 10*3/uL (ref 150.0–400.0)
RBC: 5.35 Mil/uL (ref 4.22–5.81)
RDW: 19.6 % — ABNORMAL HIGH (ref 11.5–15.5)
WBC: 10.8 10*3/uL — ABNORMAL HIGH (ref 4.0–10.5)

## 2014-10-23 LAB — BRAIN NATRIURETIC PEPTIDE: Pro B Natriuretic peptide (BNP): 632 pg/mL — ABNORMAL HIGH (ref 0.0–100.0)

## 2014-10-23 LAB — D-DIMER, QUANTITATIVE (NOT AT ARMC): D DIMER QUANT: 2.59 ug{FEU}/mL — AB (ref 0.00–0.48)

## 2014-10-23 MED ORDER — BUDESONIDE-FORMOTEROL FUMARATE 160-4.5 MCG/ACT IN AERO: 2.0000 | INHALATION_SPRAY | Freq: Two times a day (BID) | RESPIRATORY_TRACT | Status: DC

## 2014-10-23 MED ORDER — ALBUTEROL SULFATE HFA 108 (90 BASE) MCG/ACT IN AERS: 2.0000 | INHALATION_SPRAY | Freq: Four times a day (QID) | RESPIRATORY_TRACT | Status: DC | PRN

## 2014-10-23 MED ORDER — ACLIDINIUM BROMIDE 400 MCG/ACT IN AEPB: 1.0000 | INHALATION_SPRAY | Freq: Two times a day (BID) | RESPIRATORY_TRACT | Status: DC

## 2014-10-23 NOTE — Telephone Encounter (Signed)
Jon White  Let him know  A) d-dimer high - so order VQ scan with CXR to look for blood clot recurrence. Also get duplex LE = indication is hx of PE, and edema and dyspnea  B) creat has gotten worse compared to oct 2015 was 1.3mg % but now is 1.54mg %  Slightly worse -> talk to PCP [facility], MD or kidney doc if he has one   C) anemia - oct 2015 was 11gm% but now 14gm% and better  Otherwise labs ok  Sendthis note to pcp  PULMONARY No results for input(s): PHART, PCO2ART, PO2ART, HCO3, TCO2, O2SAT in the last 168 hours.  Invalid input(s): PCO2, PO2  CBC  Recent Labs Lab 10/23/14 1233  HGB 14.1  HCT 43.4  WBC 10.8*  PLT 241.0    COAGULATION No results for input(s): INR in the last 168 hours.  CARDIAC  No results for input(s): TROPONINI in the last 168 hours.  Recent Labs Lab 10/23/14 1233  PROBNP 632.0*     CHEMISTRY  Recent Labs Lab 10/23/14 1233  NA 139  K 5.1  CL 103  CO2 29  GLUCOSE 87  BUN 25*  CREATININE 1.54*  CALCIUM 9.5   Estimated Creatinine Clearance: 37.6 mL/min (by C-G formula based on Cr of 1.54).   LIVER No results for input(s): AST, ALT, ALKPHOS, BILITOT, PROT, ALBUMIN, INR in the last 168 hours.   INFECTIOUS No results for input(s): LATICACIDVEN, PROCALCITON in the last 168 hours.   ENDOCRINE CBG (last 3)  No results for input(s): GLUCAP in the last 72 hours.       IMAGING x48h  - image(s) personally visualized  -   highlighted in bold No results found.

## 2014-10-23 NOTE — Patient Instructions (Addendum)
ICD-9-CM ICD-10-CM   1. Chronic obstructive pulmonary disease, unspecified COPD, unspecified chronic bronchitis type 496 J44.9   2. Pulmonary embolism 415.19 I26.99   3. SHORTNESS OF BREATH (SOB) 786.05 R06.02      #COPD -I think copd is stable - I do not think you are in flare up  - continue symbicort daily 2 puff twice daily - continue tudorza 1 puff twice daily - use albuterol as needed   #PE treatment  June 2013 - through Fall 2015 - stopped following GI bleed  - recheck d-dimer  #Worsening shortness of breath - Unclear why you're more short of breath - could be heart or conditioning issues more likely - do CBC and BMET, BNP (last blood work in 2015 you had mild anemia) -order  echo on your to assess cardiac left and right heart functiion hopefully before you see cards - will let Dr Angelena Form know the results;  - Once cardiology clears to weekend refer you to pulmonary rehabilitation to address deconditioning component   #Followup  - 3 months with me or NP Tammy - PFT in 3 months   Call or return sooner if there are problems

## 2014-10-23 NOTE — Progress Notes (Signed)
Subjective:    Patient ID: Jon White, male    DOB: 05-Aug-1932, 81 y.o.   MRN: [id number]  HPI    OV 10/23/2014  Chief Complaint  Patient presents with  . Follow-up    pt c/o more mucus production yellow to clear and sob with exhertion. Pt denies cough and or wheezing and chest tightness.   Follow-up he did presents with his wife. Both give history.  Gold stage II-3 COPD: He is compliant with his triple inhaler therapy. There are no new problems. There is no exacerbation between last visit and current visit  New issue: He is reporting worsening shortness of breath for the last few months. He notices it when he climbs stairs. Relieved by rest. Moderate in intensity. No sustained chest pain. There is no associated worsening of cough but there is some increased sputum in terms of volume but no change in baseline color. No associated wheezing or chest pain. He is chronic pedal edema that is unchanged from baseline. He has associated coronary artery disease. He did have heart attack in summer/fall 2015 in [place]. [name] Kitchen He is due to see Dr. Julianne Handler again and according to the patient and his wife a cardiac stress test has been planned. His last echocardiogram was EF 45% in [place] in September 2015. This is a drop and a change from his baseline. In addition his most recent hemoglobin showed mild anemia with a hemoglobin of 11 g percent in October 2015.  Pulmonary embolism: He completed treatment between summer 2013 and summer 2015  years. We had to stop this after GI bleed in [place] where according to the patient reports he had multiple gastric ulcers. I then referred him to local gastroenterologist. I review the old chart. I notice that he still had residual also in his first endoscopy. He then had repeat endoscopy a few weeks ago by Dr. Deatra Ina and according to his history and wife's history the ulcers have been cleared.     Current outpatient prescriptions:  .  Aclidinium Bromide  (TUDORZA PRESSAIR) 400 MCG/ACT AEPB, Inhale 1 puff into the lungs 2 (two) times daily., Disp: 1 each, Rfl: 5 .  albuterol (VENTOLIN HFA) 108 (90 BASE) MCG/ACT inhaler, Inhale 2 puffs into the lungs every 6 (six) hours as needed for wheezing or shortness of breath., Disp: 1 Inhaler, Rfl: 2 .  atorvastatin (LIPITOR) 10 MG tablet, Take 10 mg by mouth daily. , Disp: , Rfl:  .  budesonide-formoterol (SYMBICORT) 160-4.5 MCG/ACT inhaler, Inhale 2 puffs into the lungs 2 (two) times daily., Disp: , Rfl:  .  esomeprazole (NEXIUM) 40 MG capsule, , Disp: , Rfl:  .  fluocinonide cream (LIDEX) 4.65 %, Apply 1 application topically daily as needed (rash). To infected area., Disp: , Rfl:  .  fluticasone (FLONASE) 50 MCG/ACT nasal spray, Place 1 spray into the nose 2 (two) times daily. Each nostril once a day., Disp: , Rfl:  .  ketoconazole (NIZORAL) 2 % shampoo, Apply 1 application topically 2 (two) times a week. , Disp: , Rfl:  .  loratadine (CLARITIN) 10 MG tablet, Take 10 mg by mouth daily., Disp: , Rfl:  .  losartan (COZAAR) 50 MG tablet, Take 1 tablet (50 mg total) by mouth daily., Disp: 30 tablet, Rfl: 11 .  metoprolol tartrate (LOPRESSOR) 25 MG tablet, Take 0.5 tablets (12.5 mg total) by mouth 2 (two) times daily., Disp: 30 tablet, Rfl: 6 .  nitroGLYCERIN (NITROSTAT) 0.4 MG SL tablet, Place  0.4 mg under the tongue every 5 (five) minutes as needed for chest pain., Disp: , Rfl:  .  phenol (CHLORASEPTIC) 1.4 % LIQD, Use as directed 1 spray in the mouth or throat as needed for throat irritation / pain., Disp: , Rfl:     Review of Systems More dyspneic    Objective:   Physical Exam  Constitutional: He is oriented to person, place, and time. He appears well-developed and well-nourished. No distress.  HENT:  Head: Normocephalic and atraumatic.  Right Ear: External ear normal.  Left Ear: External ear normal.  Mouth/Throat: Oropharynx is clear and moist. No oropharyngeal exudate.  Eyes: Conjunctivae and EOM  are normal. Pupils are equal, round, and reactive to light. Right eye exhibits no discharge. Left eye exhibits no discharge. No scleral icterus.  Neck: Normal range of motion. Neck supple. No JVD present. No tracheal deviation present. No thyromegaly present.  Cardiovascular: Normal rate, regular rhythm and intact distal pulses.  Exam reveals no gallop and no friction rub.   No murmur heard. Pulmonary/Chest: Effort normal and breath sounds normal. No respiratory distress. He has no wheezes. He has no rales. He exhibits no tenderness.  Abdominal: Soft. Bowel sounds are normal. He exhibits no distension and no mass. There is no tenderness. There is no rebound and no guarding.  Chronic scar from surgery. Healed by secondary intention  Musculoskeletal: Normal range of motion. He exhibits edema. He exhibits no tenderness.  Lymphadenopathy:    He has no cervical adenopathy.  Neurological: He is alert and oriented to person, place, and time. He has normal reflexes. No cranial nerve deficit. Coordination normal.  Skin: Skin is warm and dry. No rash noted. He is not diaphoretic. No erythema. No pallor.  Psychiatric: He has a normal mood and affect. His behavior is normal. Judgment and thought content normal.  Nursing note and vitals reviewed.   Filed Vitals:   10/23/14 1133  BP: 110/68  Pulse: 58  Height: 5\' 8"  (1.727 m)  Weight: 165 lb 12.8 oz (75.206 kg)  SpO2: 96%          Assessment & Plan:     ICD-9-CM ICD-10-CM   1. Chronic obstructive pulmonary disease, unspecified COPD, unspecified chronic bronchitis type 496 J44.9 Pulmonary Function Test  2. Pulmonary embolism 415.19 I26.99 CBC     Basic Metabolic Panel (BMET)     D-Dimer, Quantitative     Echocardiogram  3. SHORTNESS OF BREATH (SOB) 786.05 R06.02 Pulmonary Function Test     CBC     Basic Metabolic Panel (BMET)     D-Dimer, Quantitative     Echocardiogram     B Nat Peptide    #COPD -I think copd is stable - I do not  think you are in flare up  - continue symbicort daily 2 puff twice daily - continue tudorza 1 puff twice daily - use albuterol as needed   #PE treatment  June 2013 - through Fall 2015 - stopped following GI bleed  - recheck d-dimer  #Worsening shortness of breath - Unclear why you're more short of breath - could be heart or conditioning issues more likely - do CBC and BMET, BNP (last blood work in 2015 you had mild anemia) -order  echo - in order  to assess cardiac left and right heart functiion hopefully before you see cards - will let Dr Angelena Form know the results;  - Once cardiology clears you, refer you to pulmonary rehabilitation to address deconditioning component   #  Followup  - 3 months with me or NP Tammy - PFT in 3 months   Call or return sooner if there are problems  Dr. Brand Males, M.D., [facility].C.P Pulmonary and Critical Care Medicine Staff Physician [place] [facility] Pager: [phone], If no answer or between  15:00h - 7:00h: call [phone]  10/23/2014 12:12 PM

## 2014-10-24 ENCOUNTER — Encounter (HOSPITAL_COMMUNITY): Payer: Self-pay

## 2014-10-24 ENCOUNTER — Ambulatory Visit (HOSPITAL_COMMUNITY)
Admission: RE | Admit: 2014-10-24 | Discharge: 2014-10-24 | Disposition: A | Discharge status: Home / Self Care | Payer: Medicare Other | Source: Ambulatory Visit | Attending: Internal Medicine | Admitting: Internal Medicine

## 2014-10-24 ENCOUNTER — Ambulatory Visit (HOSPITAL_BASED_OUTPATIENT_CLINIC_OR_DEPARTMENT_OTHER): Payer: Medicare Other

## 2014-10-24 ENCOUNTER — Other Ambulatory Visit: Payer: Self-pay

## 2014-10-24 ENCOUNTER — Telehealth: Payer: Self-pay | Admitting: Internal Medicine

## 2014-10-24 ENCOUNTER — Inpatient Hospital Stay (HOSPITAL_COMMUNITY)
Admission: AD | Admit: 2014-10-24 | Discharge: 2014-10-28 | DRG: 299 | Disposition: A | Discharge status: Home / Self Care | Payer: Medicare Other | Source: Ambulatory Visit | Attending: Internal Medicine | Admitting: Internal Medicine

## 2014-10-24 ENCOUNTER — Other Ambulatory Visit: Payer: Self-pay | Admitting: Internal Medicine

## 2014-10-24 DIAGNOSIS — E119 Type 2 diabetes mellitus without complications: Secondary | ICD-10-CM | POA: Diagnosis present

## 2014-10-24 DIAGNOSIS — I129 Hypertensive chronic kidney disease with stage 1 through stage 4 chronic kidney disease, or unspecified chronic kidney disease: Secondary | ICD-10-CM | POA: Diagnosis present

## 2014-10-24 DIAGNOSIS — G4733 Obstructive sleep apnea (adult) (pediatric): Secondary | ICD-10-CM | POA: Diagnosis present

## 2014-10-24 DIAGNOSIS — K219 Gastro-esophageal reflux disease without esophagitis: Secondary | ICD-10-CM | POA: Diagnosis present

## 2014-10-24 DIAGNOSIS — N189 Chronic kidney disease, unspecified: Secondary | ICD-10-CM

## 2014-10-24 DIAGNOSIS — Z87891 Personal history of nicotine dependence: Secondary | ICD-10-CM | POA: Diagnosis not present

## 2014-10-24 DIAGNOSIS — I82A12 Acute embolism and thrombosis of left axillary vein: Principal | ICD-10-CM

## 2014-10-24 DIAGNOSIS — N183 Chronic kidney disease, stage 3 unspecified: Secondary | ICD-10-CM

## 2014-10-24 DIAGNOSIS — Z7901 Long term (current) use of anticoagulants: Secondary | ICD-10-CM | POA: Diagnosis not present

## 2014-10-24 DIAGNOSIS — R0602 Shortness of breath: Secondary | ICD-10-CM

## 2014-10-24 DIAGNOSIS — Z8546 Personal history of malignant neoplasm of prostate: Secondary | ICD-10-CM

## 2014-10-24 DIAGNOSIS — Z961 Presence of intraocular lens: Secondary | ICD-10-CM | POA: Diagnosis present

## 2014-10-24 DIAGNOSIS — I82401 Acute embolism and thrombosis of unspecified deep veins of right lower extremity: Secondary | ICD-10-CM | POA: Diagnosis not present

## 2014-10-24 DIAGNOSIS — Z886 Allergy status to analgesic agent status: Secondary | ICD-10-CM | POA: Diagnosis not present

## 2014-10-24 DIAGNOSIS — E782 Mixed hyperlipidemia: Secondary | ICD-10-CM | POA: Diagnosis present

## 2014-10-24 DIAGNOSIS — J449 Chronic obstructive pulmonary disease, unspecified: Secondary | ICD-10-CM | POA: Diagnosis not present

## 2014-10-24 DIAGNOSIS — I808 Phlebitis and thrombophlebitis of other sites: Secondary | ICD-10-CM | POA: Diagnosis not present

## 2014-10-24 DIAGNOSIS — I2699 Other pulmonary embolism without acute cor pulmonale: Secondary | ICD-10-CM

## 2014-10-24 DIAGNOSIS — J45909 Unspecified asthma, uncomplicated: Secondary | ICD-10-CM | POA: Diagnosis present

## 2014-10-24 DIAGNOSIS — Z888 Allergy status to other drugs, medicaments and biological substances status: Secondary | ICD-10-CM

## 2014-10-24 DIAGNOSIS — I252 Old myocardial infarction: Secondary | ICD-10-CM

## 2014-10-24 DIAGNOSIS — Z9842 Cataract extraction status, left eye: Secondary | ICD-10-CM

## 2014-10-24 DIAGNOSIS — I82409 Acute embolism and thrombosis of unspecified deep veins of unspecified lower extremity: Secondary | ICD-10-CM | POA: Diagnosis present

## 2014-10-24 DIAGNOSIS — Z9841 Cataract extraction status, right eye: Secondary | ICD-10-CM

## 2014-10-24 HISTORY — DX: Acute embolism and thrombosis of left axillary vein: I82.A12

## 2014-10-24 HISTORY — DX: Essential (primary) hypertension: I10

## 2014-10-24 MED ORDER — ATORVASTATIN CALCIUM 10 MG PO TABS
10.0000 mg | ORAL_TABLET | Freq: Every day | ORAL | Status: DC
  Administered 2014-10-24 – 2014-10-27 (×4): 10 mg via ORAL
  Filled 2014-10-24 (×5): qty 1

## 2014-10-24 MED ORDER — TECHNETIUM TO 99M ALBUMIN AGGREGATED
6.0000 | Freq: Once | INTRAVENOUS | Status: AC | PRN
  Administered 2014-10-24: 6 via INTRAVENOUS

## 2014-10-24 MED ORDER — SODIUM CHLORIDE 0.9 % IV SOLN
INTRAVENOUS | Status: DC
  Administered 2014-10-24: 21:00:00 via INTRAVENOUS

## 2014-10-24 MED ORDER — PANTOPRAZOLE SODIUM 40 MG PO TBEC
40.0000 mg | DELAYED_RELEASE_TABLET | Freq: Every day | ORAL | Status: DC
  Administered 2014-10-25 – 2014-10-28 (×4): 40 mg via ORAL
  Filled 2014-10-24 (×4): qty 1

## 2014-10-24 MED ORDER — LOSARTAN POTASSIUM 50 MG PO TABS
50.0000 mg | ORAL_TABLET | Freq: Every day | ORAL | Status: DC
  Administered 2014-10-25 – 2014-10-28 (×4): 50 mg via ORAL
  Filled 2014-10-24 (×4): qty 1

## 2014-10-24 MED ORDER — TIOTROPIUM BROMIDE MONOHYDRATE 18 MCG IN CAPS
18.0000 ug | ORAL_CAPSULE | Freq: Every day | RESPIRATORY_TRACT | Status: DC
  Filled 2014-10-24: qty 5

## 2014-10-24 MED ORDER — LORATADINE 10 MG PO TABS
10.0000 mg | ORAL_TABLET | Freq: Every day | ORAL | Status: DC
  Administered 2014-10-25 – 2014-10-28 (×4): 10 mg via ORAL
  Filled 2014-10-24 (×4): qty 1

## 2014-10-24 MED ORDER — METOPROLOL TARTRATE 12.5 MG HALF TABLET
12.5000 mg | ORAL_TABLET | Freq: Two times a day (BID) | ORAL | Status: DC
  Administered 2014-10-24 – 2014-10-28 (×8): 12.5 mg via ORAL
  Filled 2014-10-24 (×9): qty 1

## 2014-10-24 MED ORDER — HEPARIN (PORCINE) IN NACL 100-0.45 UNIT/ML-% IJ SOLN
1000.0000 [IU]/h | INTRAMUSCULAR | Status: DC
  Administered 2014-10-24: 1100 [IU]/h via INTRAVENOUS
  Filled 2014-10-24 (×3): qty 250

## 2014-10-24 MED ORDER — HEPARIN BOLUS VIA INFUSION
4000.0000 [IU] | Freq: Once | INTRAVENOUS | Status: AC
  Administered 2014-10-24: 4000 [IU] via INTRAVENOUS
  Filled 2014-10-24: qty 4000

## 2014-10-24 MED ORDER — ALBUTEROL SULFATE (2.5 MG/3ML) 0.083% IN NEBU: 2.5000 mg | INHALATION_SOLUTION | Freq: Four times a day (QID) | RESPIRATORY_TRACT | Status: DC | PRN

## 2014-10-24 MED ORDER — ATORVASTATIN CALCIUM 10 MG PO TABS: 10.0000 mg | ORAL_TABLET | Freq: Every day | ORAL | Status: DC

## 2014-10-24 MED ORDER — FLUTICASONE PROPIONATE 50 MCG/ACT NA SUSP
2.0000 | Freq: Every day | NASAL | Status: DC
Start: 2014-10-24 — End: 2014-10-28
  Administered 2014-10-24 – 2014-10-27 (×4): 2 via NASAL
  Filled 2014-10-24: qty 16

## 2014-10-24 MED ORDER — BUDESONIDE-FORMOTEROL FUMARATE 160-4.5 MCG/ACT IN AERO
2.0000 | INHALATION_SPRAY | Freq: Two times a day (BID) | RESPIRATORY_TRACT | Status: DC
  Administered 2014-10-24 – 2014-10-28 (×7): 2 via RESPIRATORY_TRACT
  Filled 2014-10-24: qty 6

## 2014-10-24 MED ORDER — SODIUM CHLORIDE 0.9 % IJ SOLN
3.0000 mL | Freq: Two times a day (BID) | INTRAMUSCULAR | Status: DC
  Administered 2014-10-25 – 2014-10-28 (×7): 3 mL via INTRAVENOUS

## 2014-10-24 MED ORDER — TECHNETIUM TC 99M DIETHYLENETRIAME-PENTAACETIC ACID: 40.0000 | Freq: Once | INTRAVENOUS | Status: AC | PRN

## 2014-10-24 MED ORDER — ALBUTEROL SULFATE HFA 108 (90 BASE) MCG/ACT IN AERS: 2.0000 | INHALATION_SPRAY | Freq: Four times a day (QID) | RESPIRATORY_TRACT | Status: DC | PRN

## 2014-10-24 NOTE — Progress Notes (Signed)
RT set up CPAP for patient with medium full face mask and pressure of 8 cmH2O. Pt tolerating well at this time.

## 2014-10-24 NOTE — Telephone Encounter (Signed)
Spoke to wife . THey are frustrated with 2h wait in ER.  TRiage my rec which wife has agreed to - do direct admit to [facility] cone tele but if no tele then regular bed - dx: worsening dyspnea, PE, new DVT - MD: Dennie Fetters - have them then go to admitting  Let me know onc ethis is done

## 2014-10-24 NOTE — Telephone Encounter (Signed)
Called and spoke to patient wife, states that they are just leaving the hospital bc he completed his scans and testing. States that they are starving and want to eat something. I advised of rec;s below and the patient wife became "frazzled" and started asking what do we do, how does this work? Stating that they sat in the ED for a long time earlier and dont want to do that again. Pt was placed on hold and call was transferred to Dr Chase Caller who agreed to speak with pt/wife.  Will send to Dr Chase Caller to document.

## 2014-10-24 NOTE — Telephone Encounter (Signed)
Called and spoke to pt. Informed him of the results and recs per MR. Orders placed. Note sent to PCP. Pt verbalized understanding and denied any further questions or concerns at this time.

## 2014-10-24 NOTE — Telephone Encounter (Signed)
Pt has been admitted, Room # 2w 13 Will send to MR as FYI.

## 2014-10-24 NOTE — Telephone Encounter (Signed)
Recommend he needs admission for IV heparin. He likely has PE. DOes not need CTA due to CRI  D.w Dr Chrissie Noa (who had done his surgery) - accept risk and anticoagulate  D/w Dr Deatra Ina of GI - confirmed that recent scope showed resoled ulcer - ok to anticoagulate  Please get hold of ER doc for me - so I can brief   Dr. Brand Males, M.D., [facility].C.P Pulmonary and Critical Care Medicine Staff Physician [place] [facility] Pager: [phone], If no answer or between  15:00h - 7:00h: call [phone]  10/24/2014 3:12 PM

## 2014-10-24 NOTE — Progress Notes (Signed)
ANTICOAGULATION CONSULT NOTE - Initial Consult  Pharmacy Consult for heparin Indication: DVT and PE  Allergies  Allergen Reactions  . Mometasone Furoate Other (See Comments)    Nasonex: CAUSED RED,ITCHY EYES; "lost my eyelashes"   . Naproxen Sodium Nausea And Vomiting  . Nsaids Other (See Comments)    Stomach ulcers  . Anoro Ellipta [Umeclidinium-Vilanterol]     Double vision  . Aspirin Hives and Nausea And Vomiting     Chills, sweats  . Breo Ellipta [Fluticasone Furoate-Vilanterol] Hives  . Dexilant [Dexlansoprazole] Diarrhea    Cramps   . Omnaris [Ciclesonide] Other (See Comments)    Nose bleed   . Spiriva Handihaler [Tiotropium Bromide Monohydrate] Other (See Comments)    Hiccups     Patient Measurements: Height: 5\' 8"  (172.7 cm) Weight: 165 lb (74.844 kg) IBW/kg (Calculated) : 68.4 Heparin Dosing Weight: 74kg  Vital Signs: Temp: 97.6 F (36.4 C) (05/26 1758) Temp Source: Oral (05/26 1758) BP: 166/71 mmHg (05/26 1758) Pulse Rate: 70 (05/26 1758)  Labs:  Recent Labs  10/23/14 1233  HGB 14.1  HCT 43.4  PLT 241.0  CREATININE 1.54*    Estimated Creatinine Clearance: 37.6 mL/min (by C-G formula based on Cr of 1.54).   Medical History: Past Medical History  Diagnosis Date  . Emphysema   . Allergic rhinitis   . Asthma   . COPD (chronic obstructive pulmonary disease)   . Recurrent upper respiratory infection (URI)     coughing from chronic sinusitis   . Normal cardiac stress test     reports stress test was several yrs. ago /w [employer]. Dr. Rogelia Mire, WNL   . OSA (obstructive sleep apnea) 2007    CPAP- report of settings on paper chart, seen by [facility] - seen 07/2011   . Renal stone     some passed spontaneous, & also had cystoscopy  . Neuromuscular disorder     reports shaking, somedays "can't hold spoon" - generalized  shakiness, is going to talk to MD about it at next appt.   . High cholesterol   . Shortness of breath on exertion   . H/O hiatal  hernia   . Chronic renal insufficiency, stage III (moderate)   . Mixed hyperlipidemia   . PE (pulmonary embolism) 10/2011  . Bleeding gastric ulcer   . Allergy   . Sleep apnea     wears c-pap  . Duodenum ulcer     with perforation  . Blood transfusion without reported diagnosis   . Cataract     bilateral removed  . Diabetes mellitus without complication     no meds- boarderline  . GERD (gastroesophageal reflux disease)   . Myocardial infarction   . Prostate cancer 1992    in remission.  s/p XRT, melanoma under left eye, left temple  . Hypertension    Assessment: 81 year old male found to have new DVT and PE on imaging. Patient noted to have history of PE in past and received treatment between 2013-2015, treatment was stopped d/t a GIB. Orders to start IV heparin now. CBC from 5/25 appears normal, d-dimer elevated.  Goal of Therapy:  Heparin level 0.3-0.7 units/ml Monitor platelets by anticoagulation protocol: Yes   Plan:  Give 4000 units bolus x 1 Start heparin infusion at 1100 units/hr Check anti-Xa level in 8 hours and daily while on heparin Continue to monitor H&H and platelets  Erin Hearing PharmD., BCPS Clinical Pharmacist Pager [phone] 10/24/2014 6:50 PM

## 2014-10-24 NOTE — Telephone Encounter (Signed)
He needs to get to the ER not for tests but for evaluation and admission of his DVT. They need to admit him under [facility]. Please let ER know at [place]. And give me number so  I can talk to ER doc myself

## 2014-10-24 NOTE — H&P (Signed)
Name: Jon White MRN: [id number] DOB: Oct 19, 1932    ADMISSION DATE:  10/24/2014 CONSULTATION DATE:  10/24/2014  REFERRING MD :  Chase Caller - Direct Admit  CHIEF COMPLAINT:  SOB  BRIEF PATIENT DESCRIPTION: 81 y.o. M with hx of PE (2013) seen by Dr. Chase Caller 5/25 for SOB.  Sent to ED 5/26 for dopplers and VQ and found to have DVT and probable PE. Directly admitted for initiation of anticoagulation.  SIGNIFICANT EVENTS  5/26 - admitted with DVT and PE  STUDIES:  LE Doppler 5/26 >>> chronic superficial thrombus in right SV.  Acute thrombus in the left distal FV, proximal PV, and one of the four left gastrocnemius veins. VQ Scan 5/26 >> Multiple moderate sized matched ventilation perfusion defects.  Intermediate Probability for PE. Echo 5/26 >>> no change since 2013.  EF 55 - 60%, grade 1 DD.   HISTORY OF PRESENT ILLNESS:  Jon White is a 81 y.o. M with PMH as outlined below including COPD Gold II - III (followed by Dr. Chase Caller).  He lives in [place] in the summer and in [place] in the winter. He was evaluated by Dr. Chase Caller on 5/25 for SOB that he had been experiencing for the past few weeks.  SOB was worse with exertion and better with rest.  He does not ever require supplemental O2 and didn't feel like he needed it recently.  He denied any chest pain, fevers/chills/sweats.  He did have a dry cough but nothing too serious.  He also has LE edema but he states that this is chronic for him.  He has hx of PE for which he was on anticoagulation 2013 - 2015 before it was stopped after he had a perforated ulcer requiring surgical repair.  He was sent for LE dopplers as well as VQ scan.  LE dopplers were positive for DVT (see study results above) and VQ scan was intermediate risk for PE.  He also had echo which was normal with the exception of grade 1 DD.  He was subsequently sent to [facility] as a direct admit on 5/26 for initiation of anticoagulation.  He had repeat EGD 5/12 (Dr.  Deatra Ina) which showed a fully healed duodenal ulcer.  Dr. Deatra Ina and Dr. Donne Hazel (who repaired his perforated ulcer) both were fine with starting anticoagulation.    PAST MEDICAL HISTORY :   has a past medical history of Emphysema; Allergic rhinitis; Asthma; COPD (chronic obstructive pulmonary disease); Recurrent upper respiratory infection (URI); Normal cardiac stress test; OSA (obstructive sleep apnea) (2007); Renal stone; Neuromuscular disorder; High cholesterol; Shortness of breath on exertion; H/O hiatal hernia; Chronic renal insufficiency, stage III (moderate); Mixed hyperlipidemia; PE (pulmonary embolism) (10/2011); Bleeding gastric ulcer; Allergy; Sleep apnea; Duodenum ulcer; Blood transfusion without reported diagnosis; Cataract; Diabetes mellitus without complication; GERD (gastroesophageal reflux disease); Myocardial infarction; Prostate cancer (1992); and Hypertension.  has past surgical history that includes Lung surgery (1986); Sinus endo w/fusion (sch. 09/15/2011); Septoplasty (09/15/11); Cataract extraction w/ intraocular lens  implant, bilateral (~ 2000); Ureteroscopy (1980's); Sinus endo w/fusion (09/15/2011); Maxillary antrostomy (09/15/2011); Sinus exploration (09/15/2011); Sphenoidectomy (09/15/2011); Septoplasty (09/15/2011); laparotomy (N/A, 08/28/2013); Repair of perforated ulcer (N/A, 08/28/2013); Gastrostomy (N/A, 08/28/2013); Esophagogastroduodenoscopy (N/A, 08/28/2013); Melanoma excision (Left); Esophagogastroduodenoscopy (N/A, 04/03/2014); Colonoscopy (N/A, 04/03/2014); and Lobectomy (Right, 1986). Prior to Admission medications   Medication Sig Start Date End Date Taking? Authorizing Provider  Aclidinium Bromide (TUDORZA PRESSAIR) 400 MCG/ACT AEPB Inhale 1 puff into the lungs 2 (two) times daily. 10/23/14   Brand Males, MD  albuterol (VENTOLIN HFA) 108 (90 BASE) MCG/ACT inhaler Inhale 2 puffs into the lungs every 6 (six) hours as needed for wheezing or shortness of breath. 10/23/14    Brand Males, MD  atorvastatin (LIPITOR) 10 MG tablet Take 10 mg by mouth daily.     Historical Provider, MD  budesonide-formoterol (SYMBICORT) 160-4.5 MCG/ACT inhaler Inhale 2 puffs into the lungs 2 (two) times daily. 10/23/14   Brand Males, MD  DEXILANT 60 MG capsule Take 60 mg by mouth daily. 08/29/14   Historical Provider, MD  esomeprazole (NEXIUM) 40 MG capsule  07/30/14   Historical Provider, MD  fluocinonide cream (LIDEX) 1.94 % Apply 1 application topically daily as needed (rash). To infected area.    Historical Provider, MD  fluticasone (FLONASE) 50 MCG/ACT nasal spray Place 1 spray into the nose 2 (two) times daily. Each nostril once a day.    Historical Provider, MD  ketoconazole (NIZORAL) 2 % shampoo Apply 1 application topically 2 (two) times a week.  10/25/13   Historical Provider, MD  loratadine (CLARITIN) 10 MG tablet Take 10 mg by mouth daily.    Historical Provider, MD  losartan (COZAAR) 50 MG tablet Take 1 tablet (50 mg total) by mouth daily. 08/06/14   Burnell Blanks, MD  metoprolol tartrate (LOPRESSOR) 25 MG tablet Take 0.5 tablets (12.5 mg total) by mouth 2 (two) times daily. 04/10/14   Burnell Blanks, MD  nitroGLYCERIN (NITROSTAT) 0.4 MG SL tablet Place 0.4 mg under the tongue every 5 (five) minutes as needed for chest pain.    Historical Provider, MD  phenol (CHLORASEPTIC) 1.4 % LIQD Use as directed 1 spray in the mouth or throat as needed for throat irritation / pain.    Historical Provider, MD   Allergies  Allergen Reactions  . Mometasone Furoate Other (See Comments)    Nasonex: CAUSED RED,ITCHY EYES; "lost my eyelashes"   . Naproxen Sodium Nausea And Vomiting  . Nsaids Other (See Comments)    Stomach ulcers  . Anoro Ellipta [Umeclidinium-Vilanterol]     Double vision  . Aspirin Hives and Nausea And Vomiting     Chills, sweats  . Breo Ellipta [Fluticasone Furoate-Vilanterol] Hives  . Dexilant [Dexlansoprazole] Diarrhea    Cramps   . Omnaris  [Ciclesonide] Other (See Comments)    Nose bleed   . Spiriva Handihaler [Tiotropium Bromide Monohydrate] Other (See Comments)    Hiccups     FAMILY HISTORY:  family history includes Asthma in his father; Heart attack in his mother; Heart disease in his mother; Prostate cancer in his brother, brother, and brother; Stroke in his sister. There is no history of Anesthesia problems, Colon cancer, Esophageal cancer, Rectal cancer, or Stomach cancer. SOCIAL HISTORY:  reports that he quit smoking about 3 years ago. His smoking use included Cigarettes. He has a 60 pack-year smoking history. He has never used smokeless tobacco. He reports that he drinks alcohol. He reports that he does not use illicit drugs.  REVIEW OF SYSTEMS:   All negative; except for those that are bolded, which indicate positives.  Constitutional: weight loss, weight gain, night sweats, fevers, chills, fatigue, weakness.  HEENT: headaches, sore throat, sneezing, nasal congestion, post nasal drip, difficulty swallowing, tooth/dental problems, visual complaints, visual changes, ear aches. Neuro: difficulty with speech, weakness, numbness, ataxia. CV:  chest pain, orthopnea, PND, swelling in lower extremities, dizziness, palpitations, syncope.  Resp: cough, hemoptysis, dyspnea, wheezing. GI  heartburn, indigestion, abdominal pain, nausea, vomiting, diarrhea, constipation, change in bowel habits, loss  of appetite, hematemesis, melena, hematochezia.  GU: dysuria, change in color of urine, urgency or frequency, flank pain, hematuria. MSK: joint pain or swelling, decreased range of motion. Psych: change in mood or affect, depression, anxiety, suicidal ideations, homicidal ideations. Skin: rash, itching, bruising.    SUBJECTIVE:  No SOB at rest but does have with activity.  Denies chest pain.  Breathing comfortably on RA.  VITAL SIGNS: Temp:  [97.6 F (36.4 C)] 97.6 F (36.4 C) (05/26 1758) Pulse Rate:  [70] 70 (05/26 1758) BP:  (166)/(71) 166/71 mmHg (05/26 1758) Weight:  [74.844 kg (165 lb)] 74.844 kg (165 lb) (05/26 1758)  PHYSICAL EXAMINATION: General: Pleasant elderly male, sitting on edge of bed, in NAD. Neuro: A&O x 3, non-focal.  HEENT: [place]/AT. PERRL, sclerae anicteric. Cardiovascular: RRR, no M/R/G.  Lungs: Respirations even and unlabored.  CTA bilaterally, No W/R/R. Abdomen: BS x 4, soft, NT/ND.  Musculoskeletal: No gross deformities, 1+ edema bilaterally..  Skin: Intact, warm, no rashes.    Recent Labs Lab 10/23/14 1233  NA 139  K 5.1  CL 103  CO2 29  BUN 25*  CREATININE 1.54*  GLUCOSE 87    Recent Labs Lab 10/23/14 1233  HGB 14.1  HCT 43.4  WBC 10.8*  PLT 241.0   Dg Chest 2 View  10/24/2014   CLINICAL DATA:  Elevated D-dimer. Chronic shortness of breath, worse for the past 1.5 months. Chest congestion.  EXAM: CHEST  2 VIEW  COMPARISON:  Previous examinations, the most recent dated 08/29/2013.  FINDINGS: Normal sized heart. The lungs are hyperexpanded with mild diffuse peribronchial thickening and accentuation of the interstitial markings. Small amount of scarring in the right mid lung zone. Surgical staples in the right lower lung zone. Thoracolumbar spine degenerative changes. Bilateral AC joint degenerative changes. Post thoracotomy rib deformity on the right.  IMPRESSION: 1. No acute abnormality. 2. Stable changes of COPD and chronic bronchitis. 3. Right postsurgical changes with parenchymal scarring.   Electronically Signed   By: Claudie Revering M.D.   On: 10/24/2014 16:05   [facility] Pulmonary Per & Vent  10/24/2014   CLINICAL DATA:  81 year old male with shortness of breath and elevated D-dimer. History of COPD/ emphysema.  EXAM: NUCLEAR MEDICINE VENTILATION - PERFUSION LUNG SCAN  TECHNIQUE: Ventilation images were obtained in multiple projections using inhaled aerosol Tc-47m DTPA. Perfusion images were obtained in multiple projections after intravenous injection of Tc-36m MAA.   RADIOPHARMACEUTICALS:  40 mCi Technetium-33m DTPA aerosol inhalation and 6 mCi Technetium-54m MAA IV  COMPARISON:  10/24/2014 chest radiograph.  08/26/2013 chest CT.  FINDINGS: Ventilation: Multiple ventilation defects identified. Some central clumping of DTPA is noted.  Perfusion: Scattered peripheral and central areas of decreased perfusion are identified, but match ventilation.  IMPRESSION: Multiple moderate size matched ventilation-perfusion defects with normal chest x-ray - INTERMEDIATE PROBABILITY for pulmonary embolus (20-79%).   Electronically Signed   By: Margarette Canada M.D.   On: 10/24/2014 17:48    ASSESSMENT / PLAN:  Pulmonary Embolism - with hx of prior PE in 2013 Bilateral DVT Plan: Start systemic heparin. Will need to bridge with Warfarin. Goal INR > 2. Will need lifelong anticoagulation.  COPD without exacerbation Plan: Spiriva in lieu of outpatient Tudorza (hospital formulary). Continue outpatient symbicort, PRN albuterol. Follow up with [facility] in 3 months for PFT's.  Hypertension Hyperlipidemia Grade 1 diastolic dysfunction Plan: Continue outpatient losartain, metoprolol, atorvastatin. Monitor hemodynamics.  GERD Plan: Continue outpatient PPI.  CKD III Plan: NS @ 50. BMP in AM.  Best Practice: Code Status:  Full Code. Nutrition:  Heart Healthy diet. VTE prophylaxis:  Heparin gtt. GI prophylaxis:  Pantoprazole.   Will call [facility] and ask them to pick up starting in AM Friday, 5/27.   Montey Hora, [place] [facility] Pager: [phone]  or [phone] 10/24/2014, 6:37 PM

## 2014-10-24 NOTE — Telephone Encounter (Signed)
Called [facility] ED, pt is not there. Per Maudie Mercury in ED, pt was never checked in, he is not showing on the census or D/C list.  Checked with [facility] ED, pt is not there either.   Called pt at home, NA, LMTCB x 1 Called pt mobile number, spoke with wife. States that they were in [facility] ED (had a 2-3 hour wait), pt went up to desk and asked if they were in the right spot and they were advised that they were supposed to go to Radiology for the CXR and VQ scan - states that someone came and got them and took them to Radiology. Pt just had cxr and is now waiting on VQ scan to be completed. Pt has not seen any physicians yet.    Will send to Dr Chase Caller as Juluis Rainier.

## 2014-10-24 NOTE — Telephone Encounter (Signed)
Received call report from [facility] w/ Doppler - pt there now Doppler shows old thrombus on the R; acute thrombus in the Left femoral vein distal, extending into the Left popliteal, one of the Left gastrocnemius.  The patient is supposed to go to the hospital for Echo and VQ for significant SOB Gina asking if pt should still be sent for these tests???  Spoke with MR > please send pt to ED now  Phone note routed to MR

## 2014-10-25 DIAGNOSIS — N183 Chronic kidney disease, stage 3 unspecified: Secondary | ICD-10-CM

## 2014-10-25 DIAGNOSIS — I82409 Acute embolism and thrombosis of unspecified deep veins of unspecified lower extremity: Secondary | ICD-10-CM | POA: Diagnosis present

## 2014-10-25 DIAGNOSIS — I2699 Other pulmonary embolism without acute cor pulmonale: Secondary | ICD-10-CM

## 2014-10-25 LAB — HEPARIN LEVEL (UNFRACTIONATED)
HEPARIN UNFRACTIONATED: 0.63 [IU]/mL (ref 0.30–0.70)
Heparin Unfractionated: 0.89 IU/mL — ABNORMAL HIGH (ref 0.30–0.70)

## 2014-10-25 LAB — TROPONIN I

## 2014-10-25 LAB — CBC
HCT: 36.4 % — ABNORMAL LOW (ref 39.0–52.0)
HEMOGLOBIN: 12.1 g/dL — AB (ref 13.0–17.0)
MCH: 27.7 pg (ref 26.0–34.0)
MCHC: 33.2 g/dL (ref 30.0–36.0)
MCV: 83.3 fL (ref 78.0–100.0)
PLATELETS: 209 10*3/uL (ref 150–400)
RBC: 4.37 MIL/uL (ref 4.22–5.81)
RDW: 18.8 % — AB (ref 11.5–15.5)
WBC: 9.5 10*3/uL (ref 4.0–10.5)

## 2014-10-25 LAB — PHOSPHORUS: PHOSPHORUS: 3.8 mg/dL (ref 2.5–4.6)

## 2014-10-25 LAB — MAGNESIUM: Magnesium: 1.7 mg/dL (ref 1.7–2.4)

## 2014-10-25 LAB — BASIC METABOLIC PANEL
ANION GAP: 9 (ref 5–15)
BUN: 21 mg/dL — ABNORMAL HIGH (ref 6–20)
CALCIUM: 8.4 mg/dL — AB (ref 8.9–10.3)
CHLORIDE: 106 mmol/L (ref 101–111)
CO2: 26 mmol/L (ref 22–32)
Creatinine, Ser: 1.4 mg/dL — ABNORMAL HIGH (ref 0.61–1.24)
GFR calc Af Amer: 54 mL/min — ABNORMAL LOW (ref >60)
GFR calc non Af Amer: 46 mL/min — ABNORMAL LOW (ref >60)
Glucose, Bld: 92 mg/dL (ref 65–99)
Potassium: 4.6 mmol/L (ref 3.5–5.1)
Sodium: 141 mmol/L (ref 135–145)

## 2014-10-25 NOTE — Progress Notes (Signed)
[facility] PROGRESS NOTE Interim History: 81 yo male with history of PE/DVT on chronic coumadin therapy prior to September 2015, COPD, prostate cancer, OSA, HLD, chronic kidney disease who is here today for cardiac follow up. He was seen on 03/25/14 for cardiac evaluation. He was in [place] September 2015 and had syncopal episode. He was taken to a [facility] where he was diagnosed with non-STEMI, coumadin was held and he was placed on IV heparin.develop significant upper GI bleed that required resuscitation with blood and emergent endoscopy which showed multiple bleeding gastric ulcers. Cardiac cath was canceled due to the GI bleeding.  He spent 9 days in the hospital and released on 02/15/14. Since then he has not had any chest pain. Echo 02/08/14 from [facility] with LVEF=40-45%. EGD 04/03/14 with active 17mm ulcer at the duodenal bulb. Otherwise EGD was normal. Severe sigmoid diverticulosis noted on colonoscopy. Repeated EGD on 5.12.2016 showed healed ulcer.   Assessment/Plan: Acute PE: - Admitted by PCCM. Started on heparin and Coumadin, VQ scan was moderate probability for PE. - Discussed benefits were discussed with the patient. - We'll have to discuss with patient's NOAC'sversus Coumadin. - Lower extremity DVT was ordered which is pending.  CKD III - Creatinine at baseline.   COPD without exacerbation: - Continue Spiriva, Symbicort and albuterol when necessary we'll follow-up with pulmonary in 3 months.  Essential hypertension: - No changes were made to his medication.  History of duodenal ulcers: - The last EGD on May 2016. - Cont PPI.     Code Status: full Family Communication: none  Disposition Plan: home in 2-3 days   Consultants:  PCCM  Procedures:  Lower ext DVT  Antibiotics:  None  HPI/Subjective: No new complaints he relates his shortness of breath is improved.  Objective: Filed Vitals:   10/24/14 1900 10/24/14 2035 10/24/14 2318  10/25/14 0542  BP:  172/72  135/66  Pulse:  66  70  Temp:  97.8 F (36.6 C)  98.1 F (36.7 C)  TempSrc:  Oral  Oral  Resp:  21  20  Height: 5\' 8"  (1.727 m)     Weight: 74.844 kg (165 lb)   73.8 kg (162 lb 11.2 oz)  SpO2:  98% 98% 100%    Intake/Output Summary (Last 24 hours) at 10/25/14 0720 Last data filed at 10/25/14 0546  Gross per 24 hour  Intake      0 ml  Output    500 ml  Net   -500 ml   Filed Weights   10/24/14 1758 10/24/14 1900 10/25/14 0542  Weight: 74.844 kg (165 lb) 74.844 kg (165 lb) 73.8 kg (162 lb 11.2 oz)    Exam:  General: Alert, awake, oriented x3, in no acute distress.  HEENT: No bruits, no goiter.  Heart: Regular rate and rhythm. Lungs: Good air movement, clear Abdomen: Soft, nontender, nondistended, positive bowel sounds.  Neuro: Grossly intact, nonfocal.   Data Reviewed: Basic Metabolic Panel:  Recent Labs Lab 10/23/14 1233 10/25/14 0345  NA 139 141  K 5.1 4.6  CL 103 106  CO2 29 26  GLUCOSE 87 92  BUN 25* 21*  CREATININE 1.54* 1.40*  CALCIUM 9.5 8.4*  MG  --  1.7  PHOS  --  3.8   Liver Function Tests: No results for input(s): AST, ALT, ALKPHOS, BILITOT, PROT, ALBUMIN in the last 168 hours. No results for input(s): LIPASE, AMYLASE in the last 168 hours. No results for input(s): AMMONIA in the  last 168 hours. CBC:  Recent Labs Lab 10/23/14 1233 10/25/14 0345  WBC 10.8* 9.5  HGB 14.1 12.1*  HCT 43.4 36.4*  MCV 81.2 83.3  PLT 241.0 209   Cardiac Enzymes:  Recent Labs Lab 10/25/14 0345  TROPONINI <0.03   BNP (last 3 results) No results for input(s): BNP in the last 8760 hours.  ProBNP (last 3 results)  Recent Labs  10/23/14 1233  PROBNP 632.0*    CBG: No results for input(s): GLUCAP in the last 168 hours.  No results found for this or any previous visit (from the past 240 hour(s)).   Studies: Dg Chest 2 View  10/24/2014   CLINICAL DATA:  Elevated D-dimer. Chronic shortness of breath, worse for the past  1.5 months. Chest congestion.  EXAM: CHEST  2 VIEW  COMPARISON:  Previous examinations, the most recent dated 08/29/2013.  FINDINGS: Normal sized heart. The lungs are hyperexpanded with mild diffuse peribronchial thickening and accentuation of the interstitial markings. Small amount of scarring in the right mid lung zone. Surgical staples in the right lower lung zone. Thoracolumbar spine degenerative changes. Bilateral AC joint degenerative changes. Post thoracotomy rib deformity on the right.  IMPRESSION: 1. No acute abnormality. 2. Stable changes of COPD and chronic bronchitis. 3. Right postsurgical changes with parenchymal scarring.   Electronically Signed   By: Claudie Revering M.D.   On: 10/24/2014 16:05   Nm Pulmonary Per & Vent  10/24/2014   CLINICAL DATA:  81 year old male with shortness of breath and elevated D-dimer. History of COPD/ emphysema.  EXAM: NUCLEAR MEDICINE VENTILATION - PERFUSION LUNG SCAN  TECHNIQUE: Ventilation images were obtained in multiple projections using inhaled aerosol Tc-66m DTPA. Perfusion images were obtained in multiple projections after intravenous injection of Tc-84m MAA.  RADIOPHARMACEUTICALS:  40 mCi Technetium-97m DTPA aerosol inhalation and 6 mCi Technetium-40m MAA IV  COMPARISON:  10/24/2014 chest radiograph.  08/26/2013 chest CT.  FINDINGS: Ventilation: Multiple ventilation defects identified. Some central clumping of DTPA is noted.  Perfusion: Scattered peripheral and central areas of decreased perfusion are identified, but match ventilation.  IMPRESSION: Multiple moderate size matched ventilation-perfusion defects with normal chest x-ray - INTERMEDIATE PROBABILITY for pulmonary embolus (20-79%).   Electronically Signed   By: Margarette Canada M.D.   On: 10/24/2014 17:48    Scheduled Meds: . atorvastatin  10 mg Oral QHS  . budesonide-formoterol  2 puff Inhalation BID  . fluticasone  2 spray Each Nare QHS  . loratadine  10 mg Oral Daily  . losartan  50 mg Oral Daily  .  metoprolol tartrate  12.5 mg Oral BID  . pantoprazole  40 mg Oral Daily  . sodium chloride  3 mL Intravenous Q12H  . tiotropium  18 mcg Inhalation Daily   Continuous Infusions: . sodium chloride 50 mL/hr at 10/24/14 2104  . heparin 1,000 Units/hr (10/25/14 0555)    Time Spent: 25 min   Charlynne Cousins  [facility] Pager [phone]. If 7PM-7AM, please contact night-coverage at [email or web address], password [facility] 10/25/2014, 7:20 AM  LOS: 1 day

## 2014-10-25 NOTE — Care Management Note (Addendum)
Case Management Note  Patient Details  Name: Jon White MRN: [id number] Date of Birth: 1932-09-04  Subjective/Objective:   Pt admitted with DVT                 Action/Plan:  Pt will go home on Eliquis, CM will provide medication assistance and will continue to monitor for disposition needs   Expected Discharge Date:                  Expected Discharge Plan:  Home/Self Care  In-House Referral:     Discharge planning Services  CM Consult, Medication Assistance  Post Acute Care Choice:    Choice offered to:     DME Arranged:    DME Agency:     HH Arranged:    [place] Agency:     Status of Service:  Complete, will sign off   Medicare Important Message Given:  Yes Date Medicare IM Given:  10/25/14 Medicare IM give by:  Ricky Ala Date Additional Medicare IM Given:    Additional Medicare Important Message give by:     If discussed at [place] of Stay Meetings, dates discussed:    Additional Comments: 10/25/14 Elenor Quinones, RN, BSN [phone].  CM provided free 30 day care to pt.  Pt informed pt that his preferred pharmacy was [facility] in [place].  CM contacted pharmacy and was informed medication is available for pick up at discharge.  CM submitted benefit check, Pt copay will be $35- prior auth required, entered physician sticky note and sent MD text page.   CM informed pt of copay post free trial period.  No additional CM needs at this time    Maryclare Labrador, RN 10/25/2014, 11:30 AM

## 2014-10-25 NOTE — Progress Notes (Addendum)
ANTICOAGULATION CONSULT NOTE - Initial Consult  Pharmacy Consult for heparin Indication: DVT and PE  Allergies  Allergen Reactions  . Mometasone Furoate Other (See Comments)    Nasonex: CAUSED RED,ITCHY EYES; "lost my eyelashes"   . Naproxen Sodium Nausea And Vomiting  . Nsaids Other (See Comments)    Stomach ulcers  . Anoro Ellipta [Umeclidinium-Vilanterol]     Double vision  . Aspirin Hives and Nausea And Vomiting     Chills, sweats  . Breo Ellipta [Fluticasone Furoate-Vilanterol] Hives  . Dexilant [Dexlansoprazole] Diarrhea    Cramps   . Omnaris [Ciclesonide] Other (See Comments)    Nose bleed   . Spiriva Handihaler [Tiotropium Bromide Monohydrate] Other (See Comments)    Hiccups     Patient Measurements: Height: 5\' 8"  (172.7 cm) Weight: 162 lb 11.2 oz (73.8 kg) IBW/kg (Calculated) : 68.4 Heparin Dosing Weight: 74kg  Vital Signs: Temp: 98.1 F (36.7 C) (05/27 0542) Temp Source: Oral (05/27 0542) BP: 135/66 mmHg (05/27 0542) Pulse Rate: 70 (05/27 0542)  Labs:  Recent Labs  10/23/14 1233 10/25/14 0345  HGB 14.1  --   HCT 43.4  --   PLT 241.0  --   HEPARINUNFRC  --  0.89*  CREATININE 1.54*  --     Estimated Creatinine Clearance: 37.6 mL/min (by C-G formula based on Cr of 1.54).   Medical History: Past Medical History  Diagnosis Date  . Emphysema   . Allergic rhinitis   . Asthma   . COPD (chronic obstructive pulmonary disease)   . Recurrent upper respiratory infection (URI)     coughing from chronic sinusitis   . Normal cardiac stress test     reports stress test was several yrs. ago /w [employer]. Dr. Rogelia Mire, WNL   . OSA (obstructive sleep apnea) 2007    CPAP- report of settings on paper chart, seen by [facility] - seen 07/2011   . Renal stone     some passed spontaneous, & also had cystoscopy  . Neuromuscular disorder     reports shaking, somedays "can't hold spoon" - generalized  shakiness, is going to talk to MD about it at next appt.   . High  cholesterol   . Shortness of breath on exertion   . H/O hiatal hernia   . Chronic renal insufficiency, stage III (moderate)   . Mixed hyperlipidemia   . PE (pulmonary embolism) 10/2011  . Bleeding gastric ulcer   . Allergy   . Sleep apnea     wears c-pap  . Duodenum ulcer     with perforation  . Blood transfusion without reported diagnosis   . Cataract     bilateral removed  . Diabetes mellitus without complication     no meds- boarderline  . GERD (gastroesophageal reflux disease)   . Myocardial infarction   . Prostate cancer 1992    in remission.  s/p XRT, melanoma under left eye, left temple  . Hypertension   . DVT of axillary vein, acute left 10/24/2014   Assessment: 81 year old male found to have new DVT and PE on imaging. Patient noted to have history of PE in past and received treatment between 2013-2015, treatment was stopped d/t a GIB. Orders to start IV heparin now. CBC from 5/25 appears normal, d-dimer elevated.  Initial HL is supratherapeutic at 0.89 on heparin 1100 units/hr. Nurse reports no issues with infusion or bleeding.  Goal of Therapy:  Heparin level 0.3-0.7 units/ml Monitor platelets by anticoagulation protocol: Yes  Plan:  Decrease heparin to 1000 units/hr Check anti-Xa level in 8 hours and daily while on heparin Continue to monitor H&H and platelets  Andrey Cota. Diona Foley, PharmD Clinical Pharmacist Pager [phone] 10/25/2014 5:48 AM  Addendum:  Heparin level this PM is therapeutic at 0.63.  Cont heparin at 1000 units/hr F/u with level in AM  Onnie Boer, PharmD Pager: [phone] 10/25/2014 4:49 PM

## 2014-10-25 NOTE — Progress Notes (Addendum)
Patient refuses to wear CPAP tonight. States he does not like the fit of our masks. RT made patient aware that if he would like to bring his home mask in to wear he could and also that if he changed his mind and would like to wear our CPAP to please call. Vitals stable.

## 2014-10-25 NOTE — Progress Notes (Signed)
UR Completed. Lysandra Loughmiller, RN, BSN.  [phone] 

## 2014-10-25 NOTE — Progress Notes (Signed)
Patient cont. To have freq. Big. P.V.C.'s and some pairs and 3 beats of V-tach. Patient is unable to  Wear CPAP and is sleeping comfort. Charge R.N. Aware. Cont. To monitor patient and rhythm.

## 2014-10-26 DIAGNOSIS — I82401 Acute embolism and thrombosis of unspecified deep veins of right lower extremity: Secondary | ICD-10-CM

## 2014-10-26 LAB — CBC
HEMATOCRIT: 38.1 % — AB (ref 39.0–52.0)
HEMOGLOBIN: 12.1 g/dL — AB (ref 13.0–17.0)
MCH: 26.1 pg (ref 26.0–34.0)
MCHC: 31.8 g/dL (ref 30.0–36.0)
MCV: 82.3 fL (ref 78.0–100.0)
Platelets: 223 10*3/uL (ref 150–400)
RBC: 4.63 MIL/uL (ref 4.22–5.81)
RDW: 18.9 % — ABNORMAL HIGH (ref 11.5–15.5)
WBC: 9.1 10*3/uL (ref 4.0–10.5)

## 2014-10-26 LAB — HEPARIN LEVEL (UNFRACTIONATED): Heparin Unfractionated: 0.61 IU/mL (ref 0.30–0.70)

## 2014-10-26 MED ORDER — APIXABAN 5 MG PO TABS
10.0000 mg | ORAL_TABLET | Freq: Two times a day (BID) | ORAL | Status: DC
  Administered 2014-10-26 – 2014-10-28 (×5): 10 mg via ORAL
  Filled 2014-10-26 (×6): qty 2

## 2014-10-26 MED ORDER — APIXABAN 5 MG PO TABS: 5.0000 mg | ORAL_TABLET | Freq: Two times a day (BID) | ORAL | Status: DC

## 2014-10-26 NOTE — Discharge Instructions (Signed)
Apixaban oral tablets What is this medicine? APIXABAN (a PIX a ban) is an anticoagulant (blood thinner). It is used to lower the chance of stroke in people with a medical condition called atrial fibrillation. It is also used to treat or prevent blood clots in the lungs or in the veins. This medicine may be used for other purposes; ask your health care provider or pharmacist if you have questions. COMMON BRAND NAME(S): Eliquis What should I tell my health care provider before I take this medicine? They need to know if you have any of these conditions: -bleeding disorders -bleeding in the brain -blood in your stools (black or tarry stools) or if you have blood in your vomit -history of stomach bleeding -kidney disease -liver disease -mechanical heart valve -an unusual or allergic reaction to apixaban, other medicines, foods, dyes, or preservatives -pregnant or trying to get pregnant -breast-feeding How should I use this medicine? Take this medicine by mouth with a glass of water. Follow the directions on the prescription label. You can take it with or without food. If it upsets your stomach, take it with food. Take your medicine at regular intervals. Do not take it more often than directed. Do not stop taking except on your doctor's advice. Stopping this medicine may increase your risk of a blot clot. Be sure to refill your prescription before you run out of medicine. Talk to your pediatrician regarding the use of this medicine in children. Special care may be needed. Overdosage: If you think you have taken too much of this medicine contact a poison control center or emergency room at once. NOTE: This medicine is only for you. Do not share this medicine with others. What if I miss a dose? If you miss a dose, take it as soon as you can. If it is almost time for your next dose, take only that dose. Do not take double or extra doses. What may interact with this medicine? This medicine may  interact with the following: -aspirin and aspirin-like medicines -certain medicines for fungal infections like ketoconazole and itraconazole -certain medicines for seizures like carbamazepine and phenytoin -certain medicines that treat or prevent blood clots like warfarin, enoxaparin, and dalteparin -clarithromycin -NSAIDs, medicines for pain and inflammation, like ibuprofen or naproxen -rifampin -ritonavir -St. John's wort This list may not describe all possible interactions. Give your health care provider a list of all the medicines, herbs, non-prescription drugs, or dietary supplements you use. Also tell them if you smoke, drink alcohol, or use illegal drugs. Some items may interact with your medicine. What should I watch for while using this medicine? Notify your doctor or health care professional and seek emergency treatment if you develop breathing problems; changes in vision; chest pain; severe, sudden headache; pain, swelling, warmth in the leg; trouble speaking; sudden numbness or weakness of the face, arm, or leg. These can be signs that your condition has gotten worse. If you are going to have surgery, tell your doctor or health care professional that you are taking this medicine. Tell your health care professional that you use this medicine before you have a spinal or epidural procedure. Sometimes people who take this medicine have bleeding problems around the spine when they have a spinal or epidural procedure. This bleeding is very rare. If you have a spinal or epidural procedure while on this medicine, call your health care professional immediately if you have back pain, numbness or tingling (especially in your legs and feet), muscle weakness, paralysis, or loss   of bladder or bowel control. Avoid sports and activities that might cause injury while you are using this medicine. Severe falls or injuries can cause unseen bleeding. Be careful when using sharp tools or knives. Consider using  an electric razor. Take special care brushing or flossing your teeth. Report any injuries, bruising, or red spots on the skin to your doctor or health care professional. What side effects may I notice from receiving this medicine? Side effects that you should report to your doctor or health care professional as soon as possible: -allergic reactions like skin rash, itching or hives, swelling of the face, lips, or tongue -signs and symptoms of bleeding such as bloody or black, tarry stools; red or dark-brown urine; spitting up blood or brown material that looks like coffee grounds; red spots on the skin; unusual bruising or bleeding from the eye, gums, or nose This list may not describe all possible side effects. Call your doctor for medical advice about side effects. You may report side effects to FDA at 1-800-FDA-1088. Where should I keep my medicine? Keep out of the reach of children. Store at room temperature between 20 and 25 degrees C (68 and 77 degrees F). Throw away any unused medicine after the expiration date. NOTE: This sheet is a summary. It may not cover all possible information. If you have questions about this medicine, talk to your doctor, pharmacist, or health care provider.  2015, Elsevier/Gold Standard. (2013-01-19 11:59:24)  

## 2014-10-26 NOTE — Progress Notes (Signed)
ANTICOAGULATION CONSULT NOTE - Follow Up Consult  Pharmacy Consult for heparin Indication: DVT and PE  Allergies  Allergen Reactions  . Mometasone Furoate Other (See Comments)    Nasonex: CAUSED RED,ITCHY EYES; "lost my eyelashes"   . Naproxen Sodium Nausea And Vomiting  . Nsaids Other (See Comments)    Stomach ulcers  . Anoro Ellipta [Umeclidinium-Vilanterol]     Double vision  . Aspirin Hives and Nausea And Vomiting     Chills, sweats  . Breo Ellipta [Fluticasone Furoate-Vilanterol] Hives  . Dexilant [Dexlansoprazole] Diarrhea    Cramps   . Omnaris [Ciclesonide] Other (See Comments)    Nose bleed   . Spiriva Handihaler [Tiotropium Bromide Monohydrate] Other (See Comments)    Hiccups     Patient Measurements: Height: 5\' 8"  (172.7 cm) Weight: 166 lb 14.2 oz (75.7 kg) IBW/kg (Calculated) : 68.4 Heparin Dosing Weight: 74kg  Vital Signs: Temp: 98.1 F (36.7 C) (05/28 0434) Temp Source: Oral (05/28 0434) BP: 156/75 mmHg (05/28 0434) Pulse Rate: 66 (05/28 0434)  Labs:  Recent Labs  10/25/14 0345 10/25/14 1538 10/26/14 0301  HGB 12.1*  --  12.1*  HCT 36.4*  --  38.1*  PLT 209  --  223  HEPARINUNFRC 0.89* 0.63 0.61  CREATININE 1.40*  --   --   TROPONINI <0.03  --   --     Estimated Creatinine Clearance: 41.4 mL/min (by C-G formula based on Cr of 1.4).   Medical History: Past Medical History  Diagnosis Date  . Emphysema   . Allergic rhinitis   . Asthma   . COPD (chronic obstructive pulmonary disease)   . Recurrent upper respiratory infection (URI)     coughing from chronic sinusitis   . Normal cardiac stress test     reports stress test was several yrs. ago /w [employer]. Dr. Rogelia Mire, WNL   . OSA (obstructive sleep apnea) 2007    CPAP- report of settings on paper chart, seen by [facility] - seen 07/2011   . Renal stone     some passed spontaneous, & also had cystoscopy  . Neuromuscular disorder     reports shaking, somedays "can't hold spoon" -  generalized  shakiness, is going to talk to MD about it at next appt.   . High cholesterol   . Shortness of breath on exertion   . H/O hiatal hernia   . Chronic renal insufficiency, stage III (moderate)   . Mixed hyperlipidemia   . PE (pulmonary embolism) 10/2011  . Bleeding gastric ulcer   . Allergy   . Sleep apnea     wears c-pap  . Duodenum ulcer     with perforation  . Blood transfusion without reported diagnosis   . Cataract     bilateral removed  . Diabetes mellitus without complication     no meds- boarderline  . GERD (gastroesophageal reflux disease)   . Myocardial infarction   . Prostate cancer 1992    in remission.  s/p XRT, melanoma under left eye, left temple  . Hypertension   . DVT of axillary vein, acute left 10/24/2014   Assessment: 81 year old male found to have new DVT and PE on imaging. Patient noted to have history of PE in past and received treatment between 2013-2015, treatment was stopped d/t a GIB. He was started IV heparin drip rate 1000 uts/hr HL 0.6 at goal.  . CBC stable, no bleeding noted. Plan to stop heparin and begin apixaban   Goal of  Therapy:  Heparin level 0.3-0.7 units/ml Monitor platelets by anticoagulation protocol: Yes   Plan:  Begin apixaban 10mg  BID x7 days then decrease to 5mg  BID  Turn heparin off when starting apixaban  Bonnita Nasuti Pharm.D. CPP, BCPS Clinical Pharmacist [phone] 10/26/2014 1:49 PM

## 2014-10-26 NOTE — Progress Notes (Addendum)
[facility] PROGRESS NOTE Interim History: 81 yo male with history of PE/DVT on chronic coumadin therapy prior to September 2015, COPD, prostate cancer, OSA, HLD, chronic kidney disease who is here today for cardiac follow up. He was seen on 03/25/14 for cardiac evaluation. He was in [place] September 2015 and had syncopal episode. He was taken to a [facility] where he was diagnosed with non-STEMI, coumadin was held and he was placed on IV heparin.develop significant upper GI bleed that required resuscitation with blood and emergent endoscopy which showed multiple bleeding gastric ulcers. Cardiac cath was canceled due to the GI bleeding.  He spent 9 days in the hospital and released on 02/15/14. Since then he has not had any chest pain. Echo 02/08/14 from [facility] with LVEF=40-45%. EGD 04/03/14 with active 57mm ulcer at the duodenal bulb. Otherwise EGD was normal. Severe sigmoid diverticulosis noted on colonoscopy. Repeated EGD on 5.12.2016 showed healed ulcer.   Assessment/Plan: Acute PE: - Admitted by PCCM. Started on heparin and Coumadin, VQ scan was moderate probability for PE. - Discussed benefits were discussed with the patient. - Will start eluquis. - Lower extremity DVT as below.  CKD III - Creatinine at baseline.   COPD without exacerbation: - Continue Spiriva, Symbicort and albuterol when necessary we'll follow-up with pulmonary in 3 months.  Essential hypertension: - No changes were made to his medication.  History of duodenal ulcers: - The last EGD on May 2016. - Cont PPI.     Code Status: full Family Communication: none  Disposition Plan: home in am   Consultants:  PCCM  Procedures:  Lower ext DVT: 5.26.2016: Acute thrombus in the left distal femoral vein, proximal popliteal vein, and one of the four left gastrocnemius veins  Antibiotics:  None  HPI/Subjective: No new complaints he relates his shortness of breath is  improved.  Objective: Filed Vitals:   10/25/14 0912 10/25/14 2118 10/25/14 2149 10/26/14 0434  BP:  125/94  156/75  Pulse:  66 55 66  Temp:  97.8 F (36.6 C)  98.1 F (36.7 C)  TempSrc:  Oral  Oral  Resp:  18 18 18   Height:      Weight:    75.7 kg (166 lb 14.2 oz)  SpO2: 98% 100% 98% 100%    Intake/Output Summary (Last 24 hours) at 10/26/14 1208 Last data filed at 10/26/14 1048  Gross per 24 hour  Intake    840 ml  Output   1600 ml  Net   -760 ml   Filed Weights   10/24/14 1900 10/25/14 0542 10/26/14 0434  Weight: 74.844 kg (165 lb) 73.8 kg (162 lb 11.2 oz) 75.7 kg (166 lb 14.2 oz)    Exam:  General: Alert, awake, oriented x3, in no acute distress.  HEENT: No bruits, no goiter.  Heart: Regular rate and rhythm. Lungs: Good air movement, clear Abdomen: Soft, nontender, nondistended, positive bowel sounds.  Neuro: Grossly intact, nonfocal.   Data Reviewed: Basic Metabolic Panel:  Recent Labs Lab 10/23/14 1233 10/25/14 0345  NA 139 141  K 5.1 4.6  CL 103 106  CO2 29 26  GLUCOSE 87 92  BUN 25* 21*  CREATININE 1.54* 1.40*  CALCIUM 9.5 8.4*  MG  --  1.7  PHOS  --  3.8   Liver Function Tests: No results for input(s): AST, ALT, ALKPHOS, BILITOT, PROT, ALBUMIN in the last 168 hours. No results for input(s): LIPASE, AMYLASE in the last 168 hours. No results for  input(s): AMMONIA in the last 168 hours. CBC:  Recent Labs Lab 10/23/14 1233 10/25/14 0345 10/26/14 0301  WBC 10.8* 9.5 9.1  HGB 14.1 12.1* 12.1*  HCT 43.4 36.4* 38.1*  MCV 81.2 83.3 82.3  PLT 241.0 209 223   Cardiac Enzymes:  Recent Labs Lab 10/25/14 0345  TROPONINI <0.03   BNP (last 3 results) No results for input(s): BNP in the last 8760 hours.  ProBNP (last 3 results)  Recent Labs  10/23/14 1233  PROBNP 632.0*    CBG: No results for input(s): GLUCAP in the last 168 hours.  No results found for this or any previous visit (from the past 240 hour(s)).   Studies: Dg Chest 2  View  10/24/2014   CLINICAL DATA:  Elevated D-dimer. Chronic shortness of breath, worse for the past 1.5 months. Chest congestion.  EXAM: CHEST  2 VIEW  COMPARISON:  Previous examinations, the most recent dated 08/29/2013.  FINDINGS: Normal sized heart. The lungs are hyperexpanded with mild diffuse peribronchial thickening and accentuation of the interstitial markings. Small amount of scarring in the right mid lung zone. Surgical staples in the right lower lung zone. Thoracolumbar spine degenerative changes. Bilateral AC joint degenerative changes. Post thoracotomy rib deformity on the right.  IMPRESSION: 1. No acute abnormality. 2. Stable changes of COPD and chronic bronchitis. 3. Right postsurgical changes with parenchymal scarring.   Electronically Signed   By: Claudie Revering M.D.   On: 10/24/2014 16:05   Nm Pulmonary Per & Vent  10/24/2014   CLINICAL DATA:  81 year old male with shortness of breath and elevated D-dimer. History of COPD/ emphysema.  EXAM: NUCLEAR MEDICINE VENTILATION - PERFUSION LUNG SCAN  TECHNIQUE: Ventilation images were obtained in multiple projections using inhaled aerosol Tc-4m DTPA. Perfusion images were obtained in multiple projections after intravenous injection of Tc-1m MAA.  RADIOPHARMACEUTICALS:  40 mCi Technetium-26m DTPA aerosol inhalation and 6 mCi Technetium-24m MAA IV  COMPARISON:  10/24/2014 chest radiograph.  08/26/2013 chest CT.  FINDINGS: Ventilation: Multiple ventilation defects identified. Some central clumping of DTPA is noted.  Perfusion: Scattered peripheral and central areas of decreased perfusion are identified, but match ventilation.  IMPRESSION: Multiple moderate size matched ventilation-perfusion defects with normal chest x-ray - INTERMEDIATE PROBABILITY for pulmonary embolus (20-79%).   Electronically Signed   By: Margarette Canada M.D.   On: 10/24/2014 17:48    Scheduled Meds: . atorvastatin  10 mg Oral QHS  . budesonide-formoterol  2 puff Inhalation BID  .  fluticasone  2 spray Each Nare QHS  . loratadine  10 mg Oral Daily  . losartan  50 mg Oral Daily  . metoprolol tartrate  12.5 mg Oral BID  . pantoprazole  40 mg Oral Daily  . sodium chloride  3 mL Intravenous Q12H  . tiotropium  18 mcg Inhalation Daily   Continuous Infusions: . sodium chloride 50 mL/hr at 10/24/14 2104  . heparin 1,000 Units/hr (10/25/14 0555)    Time Spent: 25 min   Charlynne Cousins  [facility] Pager [phone]. If 7PM-7AM, please contact night-coverage at [email or web address], password [facility] 10/26/2014, 12:08 PM  LOS: 2 days

## 2014-10-26 NOTE — Progress Notes (Signed)
Patient refused CPAP, stated he wanted to wear oxygen instead.

## 2014-10-27 LAB — CBC
HCT: 36.1 % — ABNORMAL LOW (ref 39.0–52.0)
Hemoglobin: 12 g/dL — ABNORMAL LOW (ref 13.0–17.0)
MCH: 27.8 pg (ref 26.0–34.0)
MCHC: 33.2 g/dL (ref 30.0–36.0)
MCV: 83.8 fL (ref 78.0–100.0)
PLATELETS: 237 10*3/uL (ref 150–400)
RBC: 4.31 MIL/uL (ref 4.22–5.81)
RDW: 18.7 % — AB (ref 11.5–15.5)
WBC: 13.4 10*3/uL — ABNORMAL HIGH (ref 4.0–10.5)

## 2014-10-27 NOTE — Progress Notes (Signed)
[facility] PROGRESS NOTE Interim History: 81 yo male with history of PE/DVT on chronic coumadin therapy prior to September 2015, COPD, prostate cancer, OSA, HLD, chronic kidney disease who is here today for cardiac follow up. He was seen on 03/25/14 for cardiac evaluation. He was in [place] September 2015 and had syncopal episode. He was taken to a [facility] where he was diagnosed with non-STEMI, coumadin was held and he was placed on IV heparin.develop significant upper GI bleed that required resuscitation with blood and emergent endoscopy which showed multiple bleeding gastric ulcers. Cardiac cath was canceled due to the GI bleeding.  He spent 9 days in the hospital and released on 02/15/14. Since then he has not had any chest pain. Echo 02/08/14 from [facility] with LVEF=40-45%. EGD 04/03/14 with active 23mm ulcer at the duodenal bulb. Otherwise EGD was normal. Severe sigmoid diverticulosis noted on colonoscopy. Repeated EGD on 5.12.2016 showed healed ulcer.   Assessment/Plan: Acute PE: - Admitted by PCCM. Started on heparin and Coumadin, VQ scan was moderate probability for PE. - Discussed benefits were discussed with the patient. - Started on eluquis. - Lower extremity DVT as below.  Leucytosis: - recheck cbc in am. - He has remained afebrile no new medications. He denies any cuts or sores.  CKD III - Creatinine at baseline.   COPD without exacerbation: - Continue Spiriva, Symbicort and albuterol when necessary we'll follow-up with pulmonary in 3 months.  Essential hypertension: - No changes were made to his medication.  History of duodenal ulcers: - The last EGD on May 2016. - Cont PPI.   Code Status: full Family Communication: none  Disposition Plan: home in am   Consultants:  PCCM  Procedures:  Lower ext DVT: 5.26.2016: Acute thrombus in the left distal femoral vein, proximal popliteal vein, and one of the four left gastrocnemius  veins  Antibiotics:  None  HPI/Subjective: No new complaints he relates his shortness of breath is improved.  Objective: Filed Vitals:   10/26/14 1448 10/26/14 1900 10/26/14 2105 10/27/14 0553  BP: 138/68  136/68 136/76  Pulse: 52  72 70  Temp: 98.2 F (36.8 C)  98 F (36.7 C) 97.8 F (36.6 C)  TempSrc: Oral  Oral Oral  Resp: 18  18 16   Height:      Weight:    75.116 kg (165 lb 9.6 oz)  SpO2: 97% 95% 96% 100%    Intake/Output Summary (Last 24 hours) at 10/27/14 1116 Last data filed at 10/27/14 0558  Gross per 24 hour  Intake      3 ml  Output    450 ml  Net   -447 ml   Filed Weights   10/25/14 0542 10/26/14 0434 10/27/14 0553  Weight: 73.8 kg (162 lb 11.2 oz) 75.7 kg (166 lb 14.2 oz) 75.116 kg (165 lb 9.6 oz)    Exam:  General: Alert, awake, oriented x3, in no acute distress.  HEENT: No bruits, no goiter.  Heart: Regular rate and rhythm. Lungs: Good air movement, clear Abdomen: Soft, nontender, nondistended, positive bowel sounds.  Neuro: Grossly intact, nonfocal.   Data Reviewed: Basic Metabolic Panel:  Recent Labs Lab 10/23/14 1233 10/25/14 0345  NA 139 141  K 5.1 4.6  CL 103 106  CO2 29 26  GLUCOSE 87 92  BUN 25* 21*  CREATININE 1.54* 1.40*  CALCIUM 9.5 8.4*  MG  --  1.7  PHOS  --  3.8   Liver Function Tests: No results  for input(s): AST, ALT, ALKPHOS, BILITOT, PROT, ALBUMIN in the last 168 hours. No results for input(s): LIPASE, AMYLASE in the last 168 hours. No results for input(s): AMMONIA in the last 168 hours. CBC:  Recent Labs Lab 10/23/14 1233 10/25/14 0345 10/26/14 0301 10/27/14 0431  WBC 10.8* 9.5 9.1 13.4*  HGB 14.1 12.1* 12.1* 12.0*  HCT 43.4 36.4* 38.1* 36.1*  MCV 81.2 83.3 82.3 83.8  PLT 241.0 209 223 237   Cardiac Enzymes:  Recent Labs Lab 10/25/14 0345  TROPONINI <0.03   BNP (last 3 results) No results for input(s): BNP in the last 8760 hours.  ProBNP (last 3 results)  Recent Labs  10/23/14 1233   PROBNP 632.0*    CBG: No results for input(s): GLUCAP in the last 168 hours.  No results found for this or any previous visit (from the past 240 hour(s)).   Studies: No results found.  Scheduled Meds: . apixaban  10 mg Oral BID  . [START ON 11/02/2014] apixaban  5 mg Oral BID  . atorvastatin  10 mg Oral QHS  . budesonide-formoterol  2 puff Inhalation BID  . fluticasone  2 spray Each Nare QHS  . loratadine  10 mg Oral Daily  . losartan  50 mg Oral Daily  . metoprolol tartrate  12.5 mg Oral BID  . pantoprazole  40 mg Oral Daily  . sodium chloride  3 mL Intravenous Q12H  . tiotropium  18 mcg Inhalation Daily   Continuous Infusions: . sodium chloride 50 mL/hr at 10/24/14 2104    Time Spent: 25 min   Charlynne Cousins  [facility] Pager [phone]. If 7PM-7AM, please contact night-coverage at [email or web address], password [facility] 10/27/2014, 11:16 AM  LOS: 3 days

## 2014-10-27 NOTE — Progress Notes (Signed)
Pt. Continues to refuse CPAP. Pt. Is aware to inform RT or RN anytime during the night if he decides to wear CPAP.

## 2014-10-28 LAB — CBC
HEMATOCRIT: 37.9 % — AB (ref 39.0–52.0)
Hemoglobin: 12.6 g/dL — ABNORMAL LOW (ref 13.0–17.0)
MCH: 27.3 pg (ref 26.0–34.0)
MCHC: 33.2 g/dL (ref 30.0–36.0)
MCV: 82.2 fL (ref 78.0–100.0)
Platelets: 238 10*3/uL (ref 150–400)
RBC: 4.61 MIL/uL (ref 4.22–5.81)
RDW: 18.5 % — AB (ref 11.5–15.5)
WBC: 10.4 10*3/uL (ref 4.0–10.5)

## 2014-10-28 MED ORDER — APIXABAN 5 MG PO TABS: 5.0000 mg | ORAL_TABLET | Freq: Two times a day (BID) | ORAL | Status: DC

## 2014-10-28 MED ORDER — APIXABAN 5 MG PO TABS: 10.0000 mg | ORAL_TABLET | Freq: Two times a day (BID) | ORAL | Status: DC

## 2014-10-28 NOTE — Discharge Summary (Signed)
Physician Discharge Summary  Jon White NTI:[id number] DOB: 1932-06-05 DOA: 10/24/2014  PCP: Merrilee Seashore, MD  Admit date: 10/24/2014 Discharge date: 10/28/2014  Time spent: 35  minutes  Recommendations for Outpatient Follow-up:  1. Follow up cardiology as an outpatient. 2-4 weeks.  2. We'll continue Eluquis 10 mg twice a day for 12 days then 5 mg by mouth 3 times a day.  Discharge Diagnoses:  Active Problems:   DVT of axillary vein, acute left   CRI (chronic renal insufficiency)   Acute pulmonary embolism   CKD (chronic kidney disease), stage III   DVT (deep venous thrombosis)   Discharge Condition: stable  Diet recommendation: heart healthy  Filed Weights   10/26/14 0434 10/27/14 0553 10/28/14 0512  Weight: 75.7 kg (166 lb 14.2 oz) 75.116 kg (165 lb 9.6 oz) 73.573 kg (162 lb 3.2 oz)    History of present illness:  81 yo male with history of PE/DVT on chronic coumadin therapy prior to September 2015, COPD, prostate cancer, OSA, HLD, chronic kidney disease who is here today for cardiac follow up. He was seen on 03/25/14 for cardiac evaluation. He was in [place] September 2015 and had syncopal episode. He was taken to a [facility] where he was diagnosed with non-STEMI, coumadin was held and he was placed on IV heparin.develop significant upper GI bleed that required resuscitation with blood and emergent endoscopy which showed multiple bleeding gastric ulcers. Cardiac cath was canceled due to the GI bleeding. He spent 9 days in the hospital and released on 02/15/14. Since then he has not had any chest pain. Echo 02/08/14 from [facility] with LVEF=40-45%. EGD 04/03/14 with active 31mm ulcer at the duodenal bulb. Otherwise EGD was normal. Severe sigmoid diverticulosis noted on colonoscopy. Repeated EGD on 5.12.2016 showed healed ulcer. Was seen by PCCM on 529 for shortness of breath sent to the ED VQ scan and Doppler were positive for PE some admitted under PCCM  service.   Hospital Course:  Acute PE: - Admitted by PCCM. Started on heparin and Coumadin, VQ scan was moderate probability for PE. - Discussed benefits were discussed with the patient. - Started on eluquis. - Lower extremity DVT as below. - His hemoglobin remained stable, as mentioned above his previous EGD showed healed ulcer. Will continue PPIs. Will follow-up with cardiology as an outpatient.  His also been advised to follow with gastroenterology Dr. As an Outpatient.  Leucytosis: - Unclear etiology no resolved.  CKD III - Creatinine at baseline.   COPD without exacerbation: - Continue Spiriva, Symbicort and albuterol when necessary we'll follow-up with pulmonary in 3 months.  Essential hypertension: - No changes were made to his medication.  History of duodenal ulcers: - The last EGD on May 2016. - Cont PPI.  Procedures:  Lower extremity Doppler positive for DVT  PET scan positive for moderate to high probability for PE.  Consultations:  PCCM  Discharge Exam: Filed Vitals:   10/28/14 0900  BP: 138/66  Pulse: 79  Temp: 98.1 F (36.7 C)  Resp: 18    General: Awake alert and oriented 3 Cardiovascular: Regular rate and rhythm Respiratory: Good air movement to auscultation.  Discharge Instructions   Discharge Instructions    Diet - low sodium heart healthy    Complete by:  As directed      Increase activity slowly    Complete by:  As directed           Current Discharge Medication List  START taking these medications   Details  !! apixaban (ELIQUIS) 5 MG TABS tablet Take 2 tablets (10 mg total) by mouth 2 (two) times daily. Qty: 48 tablet, Refills: 0    !! apixaban (ELIQUIS) 5 MG TABS tablet Take 1 tablet (5 mg total) by mouth 2 (two) times daily. Qty: 60 tablet, Refills: 0     !! - Potential duplicate medications found. Please discuss with provider.    CONTINUE these medications which have NOT CHANGED   Details  Aclidinium Bromide  (TUDORZA PRESSAIR) 400 MCG/ACT AEPB Inhale 1 puff into the lungs 2 (two) times daily. Qty: 1 each, Refills: 5    atorvastatin (LIPITOR) 10 MG tablet Take 10 mg by mouth daily at 6 PM.     budesonide-formoterol (SYMBICORT) 160-4.5 MCG/ACT inhaler Inhale 2 puffs into the lungs 2 (two) times daily. Qty: 1 Inhaler, Refills: 6    esomeprazole (NEXIUM) 40 MG capsule Take 40 mg by mouth daily.     fluocinonide cream (LIDEX) 8.76 % Apply 1 application topically daily as needed (rash). To infected area.    fluticasone (FLONASE) 50 MCG/ACT nasal spray Place 2 sprays into the nose at bedtime. Each nostril once a day.    ketoconazole (NIZORAL) 2 % shampoo Apply 1 application topically 2 (two) times a week.     loratadine (CLARITIN) 10 MG tablet Take 10 mg by mouth daily.    losartan (COZAAR) 50 MG tablet Take 1 tablet (50 mg total) by mouth daily. Qty: 30 tablet, Refills: 11    metoprolol tartrate (LOPRESSOR) 25 MG tablet Take 0.5 tablets (12.5 mg total) by mouth 2 (two) times daily. Qty: 30 tablet, Refills: 6    albuterol (VENTOLIN HFA) 108 (90 BASE) MCG/ACT inhaler Inhale 2 puffs into the lungs every 6 (six) hours as needed for wheezing or shortness of breath. Qty: 1 Inhaler, Refills: 2    nitroGLYCERIN (NITROSTAT) 0.4 MG SL tablet Place 0.4 mg under the tongue every 5 (five) minutes as needed for chest pain.    phenol (CHLORASEPTIC) 1.4 % LIQD Use as directed 1 spray in the mouth or throat as needed for throat irritation / pain.       Allergies  Allergen Reactions  . Mometasone Furoate Other (See Comments)    Nasonex: CAUSED RED,ITCHY EYES; "lost my eyelashes"   . Naproxen Sodium Nausea And Vomiting  . Nsaids Other (See Comments)    Stomach ulcers  . Anoro Ellipta [Umeclidinium-Vilanterol]     Double vision  . Aspirin Hives and Nausea And Vomiting     Chills, sweats  . Breo Ellipta [Fluticasone Furoate-Vilanterol] Hives  . Dexilant [Dexlansoprazole] Diarrhea    Cramps   .  Omnaris [Ciclesonide] Other (See Comments)    Nose bleed   . Spiriva Handihaler [Tiotropium Bromide Monohydrate] Other (See Comments)    Hiccups       The results of significant diagnostics from this hospitalization (including imaging, microbiology, ancillary and laboratory) are listed below for reference.    Significant Diagnostic Studies: Dg Chest 2 View  10/24/2014   CLINICAL DATA:  Elevated D-dimer. Chronic shortness of breath, worse for the past 1.5 months. Chest congestion.  EXAM: CHEST  2 VIEW  COMPARISON:  Previous examinations, the most recent dated 08/29/2013.  FINDINGS: Normal sized heart. The lungs are hyperexpanded with mild diffuse peribronchial thickening and accentuation of the interstitial markings. Small amount of scarring in the right mid lung zone. Surgical staples in the right lower lung zone. Thoracolumbar spine degenerative changes.  Bilateral AC joint degenerative changes. Post thoracotomy rib deformity on the right.  IMPRESSION: 1. No acute abnormality. 2. Stable changes of COPD and chronic bronchitis. 3. Right postsurgical changes with parenchymal scarring.   Electronically Signed   By: Claudie Revering M.D.   On: 10/24/2014 16:05   Nm Pulmonary Per & Vent  10/24/2014   CLINICAL DATA:  81 year old male with shortness of breath and elevated D-dimer. History of COPD/ emphysema.  EXAM: NUCLEAR MEDICINE VENTILATION - PERFUSION LUNG SCAN  TECHNIQUE: Ventilation images were obtained in multiple projections using inhaled aerosol Tc-75m DTPA. Perfusion images were obtained in multiple projections after intravenous injection of Tc-30m MAA.  RADIOPHARMACEUTICALS:  40 mCi Technetium-28m DTPA aerosol inhalation and 6 mCi Technetium-47m MAA IV  COMPARISON:  10/24/2014 chest radiograph.  08/26/2013 chest CT.  FINDINGS: Ventilation: Multiple ventilation defects identified. Some central clumping of DTPA is noted.  Perfusion: Scattered peripheral and central areas of decreased perfusion are  identified, but match ventilation.  IMPRESSION: Multiple moderate size matched ventilation-perfusion defects with normal chest x-ray - INTERMEDIATE PROBABILITY for pulmonary embolus (20-79%).   Electronically Signed   By: Margarette Canada M.D.   On: 10/24/2014 17:48    Microbiology: No results found for this or any previous visit (from the past 240 hour(s)).   Labs: Basic Metabolic Panel:  Recent Labs Lab 10/23/14 1233 10/25/14 0345  NA 139 141  K 5.1 4.6  CL 103 106  CO2 29 26  GLUCOSE 87 92  BUN 25* 21*  CREATININE 1.54* 1.40*  CALCIUM 9.5 8.4*  MG  --  1.7  PHOS  --  3.8   Liver Function Tests: No results for input(s): AST, ALT, ALKPHOS, BILITOT, PROT, ALBUMIN in the last 168 hours. No results for input(s): LIPASE, AMYLASE in the last 168 hours. No results for input(s): AMMONIA in the last 168 hours. CBC:  Recent Labs Lab 10/23/14 1233 10/25/14 0345 10/26/14 0301 10/27/14 0431 10/28/14 0352  WBC 10.8* 9.5 9.1 13.4* 10.4  HGB 14.1 12.1* 12.1* 12.0* 12.6*  HCT 43.4 36.4* 38.1* 36.1* 37.9*  MCV 81.2 83.3 82.3 83.8 82.2  PLT 241.0 209 223 237 238   Cardiac Enzymes:  Recent Labs Lab 10/25/14 0345  TROPONINI <0.03   BNP: BNP (last 3 results) No results for input(s): BNP in the last 8760 hours.  ProBNP (last 3 results)  Recent Labs  10/23/14 1233  PROBNP 632.0*    CBG: No results for input(s): GLUCAP in the last 168 hours.   Signed:  Charlynne Cousins  [facility] 10/28/2014, 9:39 AM

## 2014-10-28 NOTE — Progress Notes (Signed)
Pt discharge education and instructions completed with pt at bedside and pt voices understanding denies any questions. Pt IV and telemetry removed; pt discharge home with wife to transport him home. Pt handed his prescriptions for Eliquis as well as educational information. Pt transported off unit via wheelchair with belongings and wife at side. P. Amo Willaim Mode Rn.

## 2014-10-30 NOTE — Care Management Note (Signed)
Case Management Note  Patient Details  Name: Jon White MRN: [id number] Date of Birth: Mar 28, 1933  Subjective/Objective:                    Action/Plan:   Expected Discharge Date:                  Expected Discharge Plan:  Home/Self Care  In-House Referral:     Discharge planning Services  CM Consult, Medication Assistance  Post Acute Care Choice:    Choice offered to:     DME Arranged:    DME Agency:     HH Arranged:    [place] Agency:     Status of Service:  Completed, signed off  Medicare Important Message Given:  Yes Date Medicare IM Given:  10/25/14 Medicare IM give by:  Ricky Ala Date Additional Medicare IM Given:  10/28/14 Additional Medicare Important Message give by:  Luz Lex, RNBSN -  If discussed at Long Length of Stay Meetings, dates discussed:  10/29/2014  Additional Comments:  Delrae Sawyers, RN 10/30/2014, 9:31 AM

## 2014-11-06 DIAGNOSIS — R3913 Splitting of urinary stream: Secondary | ICD-10-CM | POA: Diagnosis not present

## 2014-11-06 DIAGNOSIS — E782 Mixed hyperlipidemia: Secondary | ICD-10-CM | POA: Diagnosis not present

## 2014-11-06 DIAGNOSIS — I2510 Atherosclerotic heart disease of native coronary artery without angina pectoris: Secondary | ICD-10-CM | POA: Diagnosis not present

## 2014-11-06 DIAGNOSIS — R339 Retention of urine, unspecified: Secondary | ICD-10-CM | POA: Diagnosis not present

## 2014-11-06 DIAGNOSIS — J449 Chronic obstructive pulmonary disease, unspecified: Secondary | ICD-10-CM | POA: Diagnosis not present

## 2014-11-07 ENCOUNTER — Encounter: Payer: Self-pay | Admitting: Cardiovascular Disease

## 2014-11-07 ENCOUNTER — Ambulatory Visit (INDEPENDENT_AMBULATORY_CARE_PROVIDER_SITE_OTHER): Payer: Medicare Other | Admitting: Cardiovascular Disease

## 2014-11-07 VITALS — BP 82/40 | HR 65 | Ht 68.0 in | Wt 164.8 lb

## 2014-11-07 DIAGNOSIS — I82402 Acute embolism and thrombosis of unspecified deep veins of left lower extremity: Secondary | ICD-10-CM | POA: Diagnosis not present

## 2014-11-07 DIAGNOSIS — I2510 Atherosclerotic heart disease of native coronary artery without angina pectoris: Secondary | ICD-10-CM | POA: Diagnosis not present

## 2014-11-07 DIAGNOSIS — E785 Hyperlipidemia, unspecified: Secondary | ICD-10-CM | POA: Diagnosis not present

## 2014-11-07 NOTE — Patient Instructions (Signed)
Medication Instructions:  Your physician recommends that you continue on your current medications as directed. Please refer to the Current Medication list given to you today.   Labwork: none  Testing/Procedures: none  Follow-Up: Your physician wants you to follow-up in: 6 months.  You will receive a reminder letter in the mail two months in advance. If you don't receive a letter, please call our office to schedule the follow-up appointment.       

## 2014-11-07 NOTE — Progress Notes (Signed)
Chief Complaint  Patient presents with  . Shortness of Breath    History of Present Illness: 81 yo male with history of PE/DVT on chronic coumadin therapy prior to September 2015, COPD, prostate cancer, OSA, HLD, chronic kidney disease who is here today for cardiac follow up. He was seen as a new patient 03/25/14 for cardiac evaluation. He was in [place] September 2015 and had syncopal episode. He felt dizzy before the episode. No palpitations. He was taken to a [facility] where he was diagnosed with non-STEMI, coumadin was held and he was placed on IV heparin. 2 days later had significant upper GI bleed that required resuscitation with blood and emergent endoscopy which showed multiple bleeding gastric ulcers. Cardiac cath was canceled due to the GI bleeding. He also had runs of non-sustained VT and was placed on amiodarone but this was changed to metoprolol before discharge. He spent 9 days in the hospital and released on 02/15/14. Since then he has not had any chest pain. Echo 02/08/14 from [facility] with LVEF=40-45%. I arranged a cardiac cath after his first visit but he cancelled this pending GI workup which was requested by his family. EGD 04/03/14 with active 50mm ulcer at the duodenal bulb. Otherwise EGD was normal. Severe sigmoid diverticulosis noted on colonoscopy. Barium enema with diverticulosis but no strictures or extravasation. Of note, ASA causes hives and Ace-inh caused dry cough. He has been seen in f/u by GI and repeat EGD on 10/10/14 showed healed ulcer. He was admitted to [facility] 10/24/14 with dyspnea and found to have recurrent PE. He was started on Eliquis. Echo 10/24/14 with normal LV function, aortic valve sclerosis.   He is here today for follow up. He denies chest pain. Breathing is at baseline. No LE edema. No near syncope or syncope.   Primary Care Physician: Rogelia Mire  Past Medical History  Diagnosis Date  . Emphysema   . Allergic rhinitis   . Asthma   . COPD  (chronic obstructive pulmonary disease)   . Recurrent upper respiratory infection (URI)     coughing from chronic sinusitis   . Normal cardiac stress test     reports stress test was several yrs. ago /w [employer]. Dr. Rogelia Mire, WNL   . OSA (obstructive sleep apnea) 2007    CPAP- report of settings on paper chart, seen by [facility] - seen 07/2011   . Renal stone     some passed spontaneous, & also had cystoscopy  . Neuromuscular disorder     reports shaking, somedays "can't hold spoon" - generalized  shakiness, is going to talk to MD about it at next appt.   . High cholesterol   . Shortness of breath on exertion   . H/O hiatal hernia   . Chronic renal insufficiency, stage III (moderate)   . Mixed hyperlipidemia   . PE (pulmonary embolism) 10/2011  . Bleeding gastric ulcer   . Allergy   . Sleep apnea     wears c-pap  . Duodenum ulcer     with perforation  . Blood transfusion without reported diagnosis   . Cataract     bilateral removed  . Diabetes mellitus without complication     no meds- boarderline  . GERD (gastroesophageal reflux disease)   . Myocardial infarction   . Prostate cancer 1992    in remission.  s/p XRT, melanoma under left eye, left temple  . Hypertension   . DVT of axillary vein, acute left 10/24/2014  Past Surgical History  Procedure Laterality Date  . Lung surgery  1986    presumed from RLL. Segmentectomy. Benign Nodule.   . Sinus endo w/fusion  sch. 09/15/2011    Procedure: ENDOSCOPIC SINUS SURGERY WITH FUSION NAVIGATION;  Surgeon: Ascencion Dike, MD;  Location: [place];  Service: ENT;  Laterality: Bilateral;  bilateral maxillary antrostomy, bilateral endoscopic ethmoidectomy, bilateral sphenoidectomy, bilateral frontal recess exploration  . Septoplasty  09/15/11  . Cataract extraction w/ intraocular lens  implant, bilateral  ~ 2000  . Ureteroscopy  1980's  . Sinus endo w/fusion  09/15/2011    Procedure: ENDOSCOPIC SINUS SURGERY WITH FUSION  NAVIGATION;  Surgeon: Ascencion Dike, MD;  Location: [place];  Service: ENT;  Laterality: Bilateral;  . Maxillary antrostomy  09/15/2011    Procedure: MAXILLARY ANTROSTOMY;  Surgeon: Ascencion Dike, MD;  Location: [facility] OR;  Service: ENT;  Laterality: Bilateral;  TOTAL MAXILLARY ANTROSTOMY  . Sinus exploration  09/15/2011    Procedure: SINUS EXPLORATION;  Surgeon: Ascencion Dike, MD;  Location: [facility] OR;  Service: ENT;  Laterality: Bilateral;  FRONTAL RECESS EXPLORATION  . Sphenoidectomy  09/15/2011    Procedure: Coralee Pesa;  Surgeon: Ascencion Dike, MD;  Location: [place];  Service: ENT;  Laterality: Bilateral;  . Septoplasty  09/15/2011    Procedure: SEPTOPLASTY;  Surgeon: Ascencion Dike, MD;  Location: [place];  Service: ENT;  Laterality: Bilateral;  . Laparotomy N/A 08/28/2013    Procedure: EXPLORATORY LAPAROTOMY;  Surgeon: Rolm Bookbinder, MD;  Location: [place];  Service: General;  Laterality: N/A;  . Repair of perforated ulcer N/A 08/28/2013    Procedure: DUODENAL ULCER PATCH;  Surgeon: Rolm Bookbinder, MD;  Location: [place];  Service: General;  Laterality: N/A;  . Gastrostomy N/A 08/28/2013    Procedure: GASTROSTOMY;  Surgeon: Rolm Bookbinder, MD;  Location: [place];  Service: General;  Laterality: N/A;  . Esophagogastroduodenoscopy N/A 08/28/2013    Procedure: ESOPHAGOGASTRODUODENOSCOPY (EGD);  Surgeon: Gayland Curry, MD;  Location: [place];  Service: General;  Laterality: N/A;  . Melanoma excision Left     under left eye  . Esophagogastroduodenoscopy N/A 04/03/2014    Procedure: ESOPHAGOGASTRODUODENOSCOPY (EGD);  Surgeon: Inda Castle, MD;  Location: [facility];  Service: Endoscopy;  Laterality: N/A;  . Colonoscopy N/A 04/03/2014    Procedure: COLONOSCOPY;  Surgeon: Inda Castle, MD;  Location: [facility];  Service: Endoscopy;  Laterality: N/A;  . Lobectomy Right 1986    1/3 removed     Current Outpatient Prescriptions  Medication Sig Dispense Refill  . Aclidinium Bromide (TUDORZA PRESSAIR) 400 MCG/ACT  AEPB Inhale 1 puff into the lungs 2 (two) times daily. 1 each 5  . albuterol (VENTOLIN HFA) 108 (90 BASE) MCG/ACT inhaler Inhale 2 puffs into the lungs every 6 (six) hours as needed for wheezing or shortness of breath. 1 Inhaler 2  . apixaban (ELIQUIS) 5 MG TABS tablet Take 1 tablet (5 mg total) by mouth 2 (two) times daily. 60 tablet 0  . atorvastatin (LIPITOR) 10 MG tablet Take 10 mg by mouth daily at 6 PM.     . budesonide-formoterol (SYMBICORT) 160-4.5 MCG/ACT inhaler Inhale 2 puffs into the lungs 2 (two) times daily. 1 Inhaler 6  . esomeprazole (NEXIUM) 40 MG capsule Take 40 mg by mouth daily.     . fluocinonide cream (LIDEX) 1.24 % Apply 1 application topically daily as needed (rash). To infected area.    . fluticasone (FLONASE) 50 MCG/ACT nasal spray  Place 2 sprays into the nose at bedtime. Each nostril once a day.    . ketoconazole (NIZORAL) 2 % shampoo Apply 1 application topically 2 (two) times a week.     . loratadine (CLARITIN) 10 MG tablet Take 10 mg by mouth daily.    [name] Kitchen losartan (COZAAR) 50 MG tablet Take 1 tablet (50 mg total) by mouth daily. 30 tablet 11  . metoprolol tartrate (LOPRESSOR) 25 MG tablet Take 0.5 tablets (12.5 mg total) by mouth 2 (two) times daily. 30 tablet 6  . nitroGLYCERIN (NITROSTAT) 0.4 MG SL tablet Place 0.4 mg under the tongue every 5 (five) minutes as needed for chest pain.    . phenol (CHLORASEPTIC) 1.4 % LIQD Use as directed 1 spray in the mouth or throat as needed for throat irritation / pain.    . tamsulosin (FLOMAX) 0.4 MG CAPS capsule Take 0.4 mg by mouth daily.     No current facility-administered medications for this visit.    Allergies  Allergen Reactions  . Mometasone Furoate Other (See Comments)    Nasonex: CAUSED RED,ITCHY EYES; "lost my eyelashes"   . Naproxen Sodium Nausea And Vomiting  . Nsaids Other (See Comments)    Stomach ulcers  . Anoro Ellipta [Umeclidinium-Vilanterol]     Double vision  . Aspirin Hives and Nausea And Vomiting       Chills, sweats  . Breo Ellipta [Fluticasone Furoate-Vilanterol] Hives  . Dexilant [Dexlansoprazole] Diarrhea    Cramps   . Omnaris [Ciclesonide] Other (See Comments)    Nose bleed   . Spiriva Handihaler [Tiotropium Bromide Monohydrate] Other (See Comments)    Hiccups     History   Social History  . Marital Status: Married    Spouse Name: N/A  . Number of Children: 4  . Years of Education: N/A   Occupational History  . 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     Social History Main Topics  . Smoking status: Former Smoker -- 1.00 packs/day for 60 years    Types: Cigarettes    Quit date: 08/06/2011  . Smokeless tobacco: Never Used  . Alcohol Use: 0.0 oz/week     Comment: rarely  . Drug Use: No  . Sexual Activity: No   Other Topics Concern  . Not on file   Social History Narrative   Traveled to [place].   Married and lives with wife.   Originally from [place].   Spends winters in [place]   Has 2 kids in [place]          Family History  Problem Relation Age of Onset  . Prostate cancer Brother   . Prostate cancer Brother   . Prostate cancer Brother   . Asthma Father   . Heart disease Mother     MI age 68  . Heart attack Mother   . Anesthesia problems Neg Hx   . Colon cancer Neg Hx   . Esophageal cancer Neg Hx   . Rectal cancer Neg Hx   . Stomach cancer Neg Hx   . Stroke Sister     Review of Systems:  As stated in the HPI and otherwise negative.   BP 82/40 mmHg  Pulse 65  Ht 5\' 8"  (1.727 m)  Wt 164 lb 12.8 oz (74.753 kg)  BMI 25.06 kg/m2  Physical Examination: General: Well developed, well nourished, NAD HEENT: OP clear, mucus membranes moist SKIN: warm, dry. No rashes. Neuro: No focal deficits Musculoskeletal: Muscle strength 5/5 all ext Psychiatric: Mood and affect normal  Neck: No JVD, no carotid bruits, no thyromegaly, no lymphadenopathy. Lungs:Clear bilaterally, no wheezes, rhonci, crackles Cardiovascular: Regular rate and rhythm. No murmurs, gallops or  rubs. Abdomen:Soft. Bowel sounds present. Non-tender.  Extremities: No lower extremity edema. Pulses are 2 + in the bilateral DP/PT.  Echo 10/24/14: - Left ventricle: The cavity size was normal. Wall thickness was normal. Systolic function was normal. The estimated ejection fraction was in the range of 55% to 60%. Doppler parameters are consistent with abnormal left ventricular relaxation (grade 1 diastolic dysfunction). The E/e&' ratio is between 8-15, suggesting indeterminate LV filling pressure. - Aortic valve: Sclerosis without stenosis. Transvalvular velocity was minimally increased. There was no regurgitation. - Mitral valve: Mildly thickened leaflets . There was mild regurgitation. - Left atrium: Moderately dilated at 38 ml/m2. Impressions: - Compared to the prior study in 2013, there has been no change. No significant TR, therefore RVSP cannot be estimated.  EKG:  EKG is not ordered today. The ekg ordered today demonstrates   Recent Labs: 10/23/2014: Pro B Natriuretic peptide (BNP) 632.0* 10/25/2014: BUN 21*; Creatinine, Ser 1.40*; Magnesium 1.7; Potassium 4.6; Sodium 141 10/28/2014: Hemoglobin 12.6*; Platelets 238   Lipid Panel    Component Value Date/Time   TRIG 56 08/28/2013 1620     Wt Readings from Last 3 Encounters:  11/07/14 164 lb 12.8 oz (74.753 kg)  10/28/14 162 lb 3.2 oz (73.573 kg)  10/23/14 165 lb 12.8 oz (75.206 kg)     Other studies Reviewed: Additional studies/ records that were reviewed today include: . Review of the above records demonstrates:    Assessment and Plan:   1. NSTEMI/CAD: NSTEMI in [facility] 9/15 but cath delayed due to GI bleeding, gastric ulcers. Recent GI workup here locally per Dr. Deatra Ina with ulcer noted duodenum and evidence of non-bleeding diverticulosis. Most recent EGD may 2016 showed the ulcer had healed. He has had no further GI bleeding. Unfortunately has been diagnosed with DVT/PE in may 2016 and is now on  Eliquis. Will not plan cardiac cath at this time. He has no chest pain. Will continue statin, ARB and beta blocker.    2. HLD: continue statin.   3. DVT/PE: Per primary care. On Eliquis.    Current medicines are reviewed at length with the patient today.  The patient does not have concerns regarding medicines.  The following changes have been made:  no change  Labs/ tests ordered today include:  No orders of the defined types were placed in this encounter.    Disposition:   FU with me in 6 months  Signed, Lauree Chandler, MD 11/07/2014 12:46 PM    [address], [place], [place]  [address] Phone: [phone]; Fax: [phone]

## 2014-11-11 ENCOUNTER — Telehealth: Payer: Self-pay | Admitting: Internal Medicine

## 2014-11-11 ENCOUNTER — Other Ambulatory Visit: Payer: Self-pay | Admitting: Cardiovascular Disease

## 2014-11-11 NOTE — Telephone Encounter (Signed)
Called spoke with pt. APPT scheduled to see TP Wednesday. Nothing further needed

## 2014-11-11 NOTE — Telephone Encounter (Signed)
We can refull his eliquis. Please give first avail with TP

## 2014-11-11 NOTE — Telephone Encounter (Signed)
Called spoke with pt. He reports he is needing to know who will refill his eliquis? And when does he need to f/u with MR since he was not told at his hospital d/c? Please advise thanks

## 2014-11-12 DIAGNOSIS — I2699 Other pulmonary embolism without acute cor pulmonale: Secondary | ICD-10-CM | POA: Diagnosis not present

## 2014-11-12 DIAGNOSIS — J449 Chronic obstructive pulmonary disease, unspecified: Secondary | ICD-10-CM | POA: Diagnosis not present

## 2014-11-12 DIAGNOSIS — E782 Mixed hyperlipidemia: Secondary | ICD-10-CM | POA: Diagnosis not present

## 2014-11-12 DIAGNOSIS — I214 Non-ST elevation (NSTEMI) myocardial infarction: Secondary | ICD-10-CM | POA: Diagnosis not present

## 2014-11-13 ENCOUNTER — Telehealth: Payer: Self-pay | Admitting: *Deleted

## 2014-11-13 ENCOUNTER — Ambulatory Visit (INDEPENDENT_AMBULATORY_CARE_PROVIDER_SITE_OTHER): Payer: Medicare Other | Admitting: Adult Health

## 2014-11-13 ENCOUNTER — Encounter: Payer: Self-pay | Admitting: Adult Health

## 2014-11-13 VITALS — BP 102/58 | HR 46 | Temp 97.9°F | Ht 68.0 in | Wt 165.0 lb

## 2014-11-13 DIAGNOSIS — I10 Essential (primary) hypertension: Secondary | ICD-10-CM

## 2014-11-13 DIAGNOSIS — R001 Bradycardia, unspecified: Secondary | ICD-10-CM | POA: Diagnosis not present

## 2014-11-13 DIAGNOSIS — J449 Chronic obstructive pulmonary disease, unspecified: Secondary | ICD-10-CM | POA: Diagnosis not present

## 2014-11-13 DIAGNOSIS — I2510 Atherosclerotic heart disease of native coronary artery without angina pectoris: Secondary | ICD-10-CM

## 2014-11-13 DIAGNOSIS — I2699 Other pulmonary embolism without acute cor pulmonale: Secondary | ICD-10-CM | POA: Diagnosis not present

## 2014-11-13 MED ORDER — LOSARTAN POTASSIUM 50 MG PO TABS: 25.0000 mg | ORAL_TABLET | Freq: Every day | ORAL | Status: DC

## 2014-11-13 MED ORDER — METOPROLOL SUCCINATE ER 25 MG PO TB24: 12.5000 mg | ORAL_TABLET | Freq: Every day | ORAL | Status: DC

## 2014-11-13 NOTE — Assessment & Plan Note (Signed)
Recurrent PE and DVT Patient has had at high risk bleed in the past with a perforated duodenal ulcer Recent endoscopy showed healed ulcer. Patient has been restarted on anticoagulation therapy with Eliquis .  Recent CBC showed stable hemoglobin. Pharmacy was contacted regarding prescription as needed prior authorization. Samples were given along with free 30 day supply. He is to follow-up closely with primary care for lab work. Has been recommended for GI follow-up . Due to previous bleed. Patient follow-up in 4-6 weeks with Dr. Chase Caller

## 2014-11-13 NOTE — Patient Instructions (Addendum)
Decrease Metoprolol 12.5mg  1/2 daily  Decrease Cozaar 50mg   1/2 daily  Continue on Eliquis 5mg  Twice daily   Report any bleeding immediately.  follow up Dr. Chase Caller in 6 weeks and As needed   follow up with cardiology in 2 weeks for b/p and bradycardia.  Please contact office for sooner follow up if symptoms do not improve or worsen or seek emergency care

## 2014-11-13 NOTE — Assessment & Plan Note (Signed)
Patient's blood pressure has been low since discharge along with  Bradycardia He has been experiencing some intermittent dizziness with standing , consistent with orthostatic  We'll adjust blood pressure medications including Cozaar and metoprolol  Plan Decrease Metoprolol 12.5mg  1/2 daily  Decrease Cozaar 50mg   1/2 daily  Continue on Eliquis 5mg  Twice daily   Report any bleeding immediately.  follow up Dr. Chase Caller in 6 weeks and As needed   follow up with cardiology in 2 weeks for b/p and bradycardia.  Please contact office for sooner follow up if symptoms do not improve or worsen or seek emergency care

## 2014-11-13 NOTE — Telephone Encounter (Signed)
Received PA request for Eliquis 5 mg from [id number] PA processed via cover my meds. GHW:[id number] PT JI:[phone]

## 2014-11-13 NOTE — Assessment & Plan Note (Signed)
Compensated on present regimen without flare  

## 2014-11-13 NOTE — Progress Notes (Signed)
   Subjective:  81 yo male with COPD/Asthma, PE , AR  Hx of life threatenting duodenal ulcer perforation.    11/13/2014 [facility] follow up  Pt returns for post hosp follow up  Admitted 5/26 to 5/30 for DVT , and PE  VQ positive for PE .  Hx of life threatening bleed with duodenal ucler perforation .  Underwent EGD on 10/10/14 which showed healed ulcer.  He was treated with Hep /Coumadin then transitioned to Eliquis .  He was tx with PPI with GI follow up in OP setting.  Needs help with Eliquis rx as [facility] not [facility] .  Does need prior aurthorization with insurance.  Has been seen by PCP with labs done last week with stable HBG.  Says he is feeling good but gets lightheaded at times with standing  B/p is on low end of normal . Has bradycardia on metoprolol       ROS:  Constitutional: Negative for fever and unexpected weight change.  HENT: Negative for congestion, dental problem, ear pain, nosebleeds, postnasal drip, rhinorrhea, sinus pressure, sneezing, sore throat and trouble swallowing.   Eyes: Negative for redness and itching.  Respiratory: Positive for cough and shortness of breath. Negative for chest tightness and wheezing.   Cardiovascular: Negative for palpitations and leg swelling.  Gastrointestinal: Negative for nausea and vomiting.  Genitourinary: Negative for dysuria.  HMusculoskeletal: Negative for joint swelling.  Skin: Negative for rash.  Neurological: Negative for headaches.  Hematological: Does not bruise/bleed easily.  Psychiatric/Behavioral: Negative for dysphoric mood. The patient is not nervous/anxious.        Objective:   Physical Exam GEN: A/Ox3; pleasant , NAD, well nourished   HEENT:  [place]/AT,  EACs-clear, TMs-wnl, NOSE-clear, THROAT-clear, no lesions, no postnasal drip or exudate noted.   NECK:  Supple w/ fair ROM; no JVD; normal carotid impulses w/o bruits; no thyromegaly or nodules palpated; no lymphadenopathy.  RESP  Clear  P &  A; w/o, wheezes/ rales/ or rhonchi.no accessory muscle use, no dullness to percussion  CARD:  RRR, no m/r/g  , no peripheral edema, pulses intact, no cyanosis or clubbing.  GI:   Soft & nt; nml bowel sounds; no organomegaly or masses detected.  Musco: Warm bil, no deformities or joint swelling noted.   Neuro: alert, no focal deficits noted.    Skin: Warm, no lesions or rashes

## 2014-11-14 NOTE — Telephone Encounter (Signed)
Eliquis 5 mg has been approved. Approved thru 05/14/2015 under Medicare Part D. Pt/Pharm to be notified. Nothing further needed.

## 2014-11-22 ENCOUNTER — Ambulatory Visit (INDEPENDENT_AMBULATORY_CARE_PROVIDER_SITE_OTHER): Payer: Medicare Other | Admitting: Cardiovascular Disease

## 2014-11-22 ENCOUNTER — Encounter: Payer: Self-pay | Admitting: Cardiovascular Disease

## 2014-11-22 VITALS — BP 122/60 | HR 85 | Ht 68.0 in | Wt 164.8 lb

## 2014-11-22 DIAGNOSIS — E785 Hyperlipidemia, unspecified: Secondary | ICD-10-CM | POA: Diagnosis not present

## 2014-11-22 DIAGNOSIS — I2510 Atherosclerotic heart disease of native coronary artery without angina pectoris: Secondary | ICD-10-CM

## 2014-11-22 DIAGNOSIS — I493 Ventricular premature depolarization: Secondary | ICD-10-CM | POA: Diagnosis not present

## 2014-11-22 MED ORDER — METOPROLOL SUCCINATE ER 25 MG PO TB24: 12.5000 mg | ORAL_TABLET | Freq: Two times a day (BID) | ORAL | Status: DC

## 2014-11-22 NOTE — Patient Instructions (Addendum)
Medication Instructions:  Your physician has recommended you make the following change in your medication Decrease Toprol to 12.5 mg by mouth twice daily. Stop Losartan   Labwork: none  Testing/Procedures: none  Follow-Up:  You have an appointment with Dr. Angelena Form on November 28, 2014 at 9:45    .

## 2014-11-22 NOTE — Progress Notes (Signed)
Chief Complaint  Patient presents with  . Shortness of Breath    History of Present Illness: 81 yo male with history of PE/DVT on chronic coumadin therapy prior to September 2015, COPD, prostate cancer, OSA, HLD, chronic kidney disease who is here today for cardiac follow up. He was seen as a new patient 03/25/14 for cardiac evaluation. He was in [place] September 2015 and had syncopal episode. He felt dizzy before the episode. No palpitations. He was taken to a [facility] where he was diagnosed with non-STEMI, coumadin was held and he was placed on IV heparin. 2 days later had significant upper GI bleed that required resuscitation with blood and emergent endoscopy which showed multiple bleeding gastric ulcers. Cardiac cath was canceled due to the GI bleeding. He also had runs of non-sustained VT and was placed on amiodarone but this was changed to metoprolol before discharge. He spent 9 days in the hospital and released on 02/15/14. Since then he has not had any chest pain. Echo 02/08/14 from [facility] with LVEF=40-45%. I arranged a cardiac cath after his first visit but he cancelled this pending GI workup which was requested by his family. EGD 04/03/14 with active 52mm ulcer at the duodenal bulb. Otherwise EGD was normal. Severe sigmoid diverticulosis noted on colonoscopy. Barium enema with diverticulosis but no strictures or extravasation. Of note, ASA causes hives and Ace-inh caused dry cough. He has been seen in f/u by GI and repeat EGD on 10/10/14 showed healed ulcer. He was admitted to [facility] 10/24/14 with dyspnea and found to have recurrent PE. He was started on Eliquis. Echo 10/24/14 with normal LV function, aortic valve sclerosis. He was seen here 11/07/14 and was doing well. He saw Pulmonary last week and his HR was felt to be in the 40s but no EKG was done.   He is here today for follow up. He denies chest pain. Breathing is at baseline. No LE edema. No near syncope or syncope. He has no  awareness of palpitations.   Primary Care Physician: Rogelia Mire  Past Medical History  Diagnosis Date  . Emphysema   . Allergic rhinitis   . Asthma   . COPD (chronic obstructive pulmonary disease)   . Recurrent upper respiratory infection (URI)     coughing from chronic sinusitis   . Normal cardiac stress test     reports stress test was several yrs. ago /w [employer]. Dr. Rogelia Mire, WNL   . OSA (obstructive sleep apnea) 2007    CPAP- report of settings on paper chart, seen by [facility] - seen 07/2011   . Renal stone     some passed spontaneous, & also had cystoscopy  . Neuromuscular disorder     reports shaking, somedays "can't hold spoon" - generalized  shakiness, is going to talk to MD about it at next appt.   . High cholesterol   . Shortness of breath on exertion   . H/O hiatal hernia   . Chronic renal insufficiency, stage III (moderate)   . Mixed hyperlipidemia   . PE (pulmonary embolism) 10/2011  . Bleeding gastric ulcer   . Allergy   . Sleep apnea     wears c-pap  . Duodenum ulcer     with perforation  . Blood transfusion without reported diagnosis   . Cataract     bilateral removed  . Diabetes mellitus without complication     no meds- boarderline  . GERD (gastroesophageal reflux disease)   .  Myocardial infarction   . Prostate cancer 1992    in remission.  s/p XRT, melanoma under left eye, left temple  . Hypertension   . DVT of axillary vein, acute left 10/24/2014    Past Surgical History  Procedure Laterality Date  . Lung surgery  1986    presumed from RLL. Segmentectomy. Benign Nodule.   . Sinus endo w/fusion  sch. 09/15/2011    Procedure: ENDOSCOPIC SINUS SURGERY WITH FUSION NAVIGATION;  Surgeon: Ascencion Dike, MD;  Location: [place];  Service: ENT;  Laterality: Bilateral;  bilateral maxillary antrostomy, bilateral endoscopic ethmoidectomy, bilateral sphenoidectomy, bilateral frontal recess exploration  . Septoplasty  09/15/11  . Cataract  extraction w/ intraocular lens  implant, bilateral  ~ 2000  . Ureteroscopy  1980's  . Sinus endo w/fusion  09/15/2011    Procedure: ENDOSCOPIC SINUS SURGERY WITH FUSION NAVIGATION;  Surgeon: Ascencion Dike, MD;  Location: [place];  Service: ENT;  Laterality: Bilateral;  . Maxillary antrostomy  09/15/2011    Procedure: MAXILLARY ANTROSTOMY;  Surgeon: Ascencion Dike, MD;  Location: [facility];  Service: ENT;  Laterality: Bilateral;  TOTAL MAXILLARY ANTROSTOMY  . Sinus exploration  09/15/2011    Procedure: SINUS EXPLORATION;  Surgeon: Ascencion Dike, MD;  Location: [facility];  Service: ENT;  Laterality: Bilateral;  FRONTAL RECESS EXPLORATION  . Sphenoidectomy  09/15/2011    Procedure: Coralee Pesa;  Surgeon: Ascencion Dike, MD;  Location: [facility];  Service: ENT;  Laterality: Bilateral;  . Septoplasty  09/15/2011    Procedure: SEPTOPLASTY;  Surgeon: Ascencion Dike, MD;  Location: [facility];  Service: ENT;  Laterality: Bilateral;  . Laparotomy N/A 08/28/2013    Procedure: EXPLORATORY LAPAROTOMY;  Surgeon: Rolm Bookbinder, MD;  Location: [place];  Service: General;  Laterality: N/A;  . Repair of perforated ulcer N/A 08/28/2013    Procedure: DUODENAL ULCER PATCH;  Surgeon: Rolm Bookbinder, MD;  Location: [place];  Service: General;  Laterality: N/A;  . Gastrostomy N/A 08/28/2013    Procedure: GASTROSTOMY;  Surgeon: Rolm Bookbinder, MD;  Location: [place];  Service: General;  Laterality: N/A;  . Esophagogastroduodenoscopy N/A 08/28/2013    Procedure: ESOPHAGOGASTRODUODENOSCOPY (EGD);  Surgeon: Gayland Curry, MD;  Location: [place];  Service: General;  Laterality: N/A;  . Melanoma excision Left     under left eye  . Esophagogastroduodenoscopy N/A 04/03/2014    Procedure: ESOPHAGOGASTRODUODENOSCOPY (EGD);  Surgeon: Inda Castle, MD;  Location: [facility];  Service: Endoscopy;  Laterality: N/A;  . Colonoscopy N/A 04/03/2014    Procedure: COLONOSCOPY;  Surgeon: Inda Castle, MD;  Location: [facility];  Service: Endoscopy;  Laterality: N/A;    . Lobectomy Right 1986    1/3 removed     Current Outpatient Prescriptions  Medication Sig Dispense Refill  . Aclidinium Bromide (TUDORZA PRESSAIR) 400 MCG/ACT AEPB Inhale 1 puff into the lungs 2 (two) times daily. 1 each 5  . albuterol (VENTOLIN HFA) 108 (90 BASE) MCG/ACT inhaler Inhale 2 puffs into the lungs every 6 (six) hours as needed for wheezing or shortness of breath. 1 Inhaler 2  . apixaban (ELIQUIS) 5 MG TABS tablet Take 1 tablet (5 mg total) by mouth 2 (two) times daily. 60 tablet 0  . atorvastatin (LIPITOR) 10 MG tablet Take 10 mg by mouth daily at 6 PM.     . budesonide-formoterol (SYMBICORT) 160-4.5 MCG/ACT inhaler Inhale 2 puffs into the lungs 2 (two) times daily. 1 Inhaler 6  . esomeprazole (NEXIUM) 40  MG capsule Take 40 mg by mouth daily.     . fluocinonide cream (LIDEX) 4.17 % Apply 1 application topically daily as needed (rash). To infected area.    . fluticasone (FLONASE) 50 MCG/ACT nasal spray Place 2 sprays into the nose at bedtime. Each nostril once a day.    . ketoconazole (NIZORAL) 2 % shampoo Apply 1 application topically 2 (two) times a week.     . loratadine (CLARITIN) 10 MG tablet Take 10 mg by mouth daily.    . metoprolol succinate (TOPROL XL) 25 MG 24 hr tablet Take 0.5 tablets (12.5 mg total) by mouth 2 (two) times daily. 30 tablet 6  . nitroGLYCERIN (NITROSTAT) 0.4 MG SL tablet Place 0.4 mg under the tongue every 5 (five) minutes as needed for chest pain.    . phenol (CHLORASEPTIC) 1.4 % LIQD Use as directed 1 spray in the mouth or throat as needed for throat irritation / pain.    . tamsulosin (FLOMAX) 0.4 MG CAPS capsule Take 0.4 mg by mouth daily.     No current facility-administered medications for this visit.    Allergies  Allergen Reactions  . Mometasone Furoate Other (See Comments)    Nasonex: CAUSED RED,ITCHY EYES; "lost my eyelashes"   . Naproxen Sodium Nausea And Vomiting  . Nsaids Other (See Comments)    Stomach ulcers  . Anoro Ellipta  [Umeclidinium-Vilanterol]     Double vision  . Aspirin Hives and Nausea And Vomiting     Chills, sweats  . Breo Ellipta [Fluticasone Furoate-Vilanterol] Hives  . Dexilant [Dexlansoprazole] Diarrhea    Cramps   . Omnaris [Ciclesonide] Other (See Comments)    Nose bleed   . Spiriva Handihaler [Tiotropium Bromide Monohydrate] Other (See Comments)    Hiccups     History   Social History  . Marital Status: Married    Spouse Name: N/A  . Number of Children: 4  . Years of Education: N/A   Occupational History  . 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     Social History Main Topics  . Smoking status: Former Smoker -- 1.00 packs/day for 60 years    Types: Cigarettes    Quit date: 08/06/2011  . Smokeless tobacco: Never Used  . Alcohol Use: 0.0 oz/week     Comment: rarely  . Drug Use: No  . Sexual Activity: No   Other Topics Concern  . Not on file   Social History Narrative   Traveled to [place].   Married and lives with wife.   Originally from [place].   Spends winters in [place]   Has 2 kids in [place]          Family History  Problem Relation Age of Onset  . Prostate cancer Brother   . Prostate cancer Brother   . Prostate cancer Brother   . Asthma Father   . Heart disease Mother     MI age 33  . Heart attack Mother   . Anesthesia problems Neg Hx   . Colon cancer Neg Hx   . Esophageal cancer Neg Hx   . Rectal cancer Neg Hx   . Stomach cancer Neg Hx   . Stroke Sister     Review of Systems:  As stated in the HPI and otherwise negative.   BP 122/60 mmHg  Pulse 85  Ht 5\' 8"  (1.727 m)  Wt 164 lb 12.8 oz (74.753 kg)  BMI 25.06 kg/m2  SpO2 98%  Physical Examination: General: Well developed, well nourished, NAD HEENT:  OP clear, mucus membranes moist SKIN: warm, dry. No rashes. Neuro: No focal deficits Musculoskeletal: Muscle strength 5/5 all ext Psychiatric: Mood and affect normal Neck: No JVD, no carotid bruits, no thyromegaly, no lymphadenopathy. Lungs:Clear  bilaterally, no wheezes, rhonci, crackles Cardiovascular: Regular rate and rhythm. No murmurs, gallops or rubs. Abdomen:Soft. Bowel sounds present. Non-tender.  Extremities: No lower extremity edema. Pulses are 2 + in the bilateral DP/PT.  Echo 10/24/14: - Left ventricle: The cavity size was normal. Wall thickness was normal. Systolic function was normal. The estimated ejection fraction was in the range of 55% to 60%. Doppler parameters are consistent with abnormal left ventricular relaxation (grade 1 diastolic dysfunction). The E/e&' ratio is between 8-15, suggesting indeterminate LV filling pressure. - Aortic valve: Sclerosis without stenosis. Transvalvular velocity was minimally increased. There was no regurgitation. - Mitral valve: Mildly thickened leaflets . There was mild regurgitation. - Left atrium: Moderately dilated at 38 ml/m2. Impressions: - Compared to the prior study in 2013, there has been no change. No significant TR, therefore RVSP cannot be estimated.  EKG:  EKG is ordered today. The ekg ordered today demonstrates Sinus, rate 85 bpm. PVCs in pattern of bigeminy.   Recent Labs: 10/23/2014: Pro B Natriuretic peptide (BNP) 632.0* 10/25/2014: BUN 21*; Creatinine, Ser 1.40*; Magnesium 1.7; Potassium 4.6; Sodium 141 10/28/2014: Hemoglobin 12.6*; Platelets 238   Lipid Panel    Component Value Date/Time   TRIG 56 08/28/2013 1620     Wt Readings from Last 3 Encounters:  11/22/14 164 lb 12.8 oz (74.753 kg)  11/13/14 165 lb (74.844 kg)  11/07/14 164 lb 12.8 oz (74.753 kg)     Other studies Reviewed: Additional studies/ records that were reviewed today include: . Review of the above records demonstrates:    Assessment and Plan:   1. NSTEMI/CAD: NSTEMI in [facility] 9/15 but cath delayed due to GI bleeding, gastric ulcers. Recent GI workup here locally per Dr. Deatra Ina with ulcer noted duodenum and evidence of non-bleeding diverticulosis. Most recent EGD may  2016 showed the ulcer had healed. He has had no further GI bleeding. Unfortunately has been diagnosed with DVT/PE in may 2016 and is now on Eliquis. Will not plan cardiac cath at this time. He has no chest pain. Will continue statin, ARB and beta blocker.    2. HLD: continue statin.   3. DVT/PE: Per primary care. On Eliquis.    4. PVCs with bigeminy: I do not think his HR was really in the 40s last week. Likely was bigeminy. Will start back Toprol at 12.5 mg po BID. With hypotension will not restart Losartan.   Current medicines are reviewed at length with the patient today.  The patient does not have concerns regarding medicines.  The following changes have been made:  no change  Labs/ tests ordered today include:   Orders Placed This Encounter  Procedures  . EKG 12-Lead    Disposition:   FU with office APP in 4 weeks for EKG and BP check.   Signed, Lauree Chandler, MD 11/22/2014 5:13 PM    [facility] [place], [place], [place]  [address] Phone: [phone]; Fax: [phone]

## 2014-11-28 ENCOUNTER — Encounter: Payer: Self-pay | Admitting: Cardiovascular Disease

## 2014-11-28 ENCOUNTER — Ambulatory Visit (INDEPENDENT_AMBULATORY_CARE_PROVIDER_SITE_OTHER): Payer: Medicare Other | Admitting: Cardiovascular Disease

## 2014-11-28 VITALS — BP 115/68 | HR 64 | Ht 68.0 in | Wt 166.4 lb

## 2014-11-28 DIAGNOSIS — I493 Ventricular premature depolarization: Secondary | ICD-10-CM | POA: Diagnosis not present

## 2014-11-28 DIAGNOSIS — I2510 Atherosclerotic heart disease of native coronary artery without angina pectoris: Secondary | ICD-10-CM

## 2014-11-28 NOTE — Patient Outreach (Signed)
[facility]) Care Management  11/28/2014  LUCIAN BASWELL [birth year] [id number]   Referral from [facility], assigned Sherrin Daisy, RN to outreach patient.  [name]. [place], [place] Management [place] Assistant Phone: [phone] Fax: [phone]

## 2014-11-28 NOTE — Patient Instructions (Signed)
Medication Instructions:  Your physician recommends that you continue on your current medications as directed. Please refer to the Current Medication list given to you today.   Labwork: none  Testing/Procedures: none  Follow-Up: Your physician wants you to follow-up in: 6 months.  You will receive a reminder letter in the mail two months in advance. If you don't receive a letter, please call our office to schedule the follow-up appointment.       

## 2014-11-28 NOTE — Progress Notes (Signed)
Chief Complaint  Patient presents with  . Follow-up    History of Present Illness: 81 yo male with history of PE/DVT on chronic coumadin therapy prior to September 2015, COPD, prostate cancer, OSA, HLD, chronic kidney disease who is here today for cardiac follow up. He was seen as a new patient 03/25/14 for cardiac evaluation. He was in [place] September 2015 and had syncopal episode. He felt dizzy before the episode. No palpitations. He was taken to a [facility] where he was diagnosed with non-STEMI, coumadin was held and he was placed on IV heparin. 2 days later had significant upper GI bleed that required resuscitation with blood and emergent endoscopy which showed multiple bleeding gastric ulcers. Cardiac cath was canceled due to the GI bleeding. He also had runs of non-sustained VT and was placed on amiodarone but this was changed to metoprolol before discharge. He spent 9 days in the hospital and released on 02/15/14. Since then he has not had any chest pain. Echo 02/08/14 from [facility] with LVEF=40-45%. I arranged a cardiac cath after his first visit but he cancelled this pending GI workup which was requested by his family. EGD 04/03/14 with active 59mm ulcer at the duodenal bulb. Otherwise EGD was normal. Severe sigmoid diverticulosis noted on colonoscopy. Barium enema with diverticulosis but no strictures or extravasation. Of note, ASA causes hives and Ace-inh caused dry cough. He has been seen in f/u by GI and repeat EGD on 10/10/14 showed healed ulcer. He was admitted to [facility] 10/24/14 with dyspnea and found to have recurrent PE. He was started on Eliquis. Echo 10/24/14 with normal LV function, aortic valve sclerosis. He was seen here 11/07/14 and was doing well. He was seen in the the Pulmonary office the following week and his HR was felt to be in the 40s but no EKG was done. I saw him 11/22/14 and EKG showed ventricular bigeminy with overall HR in the 80s. I started Toprol 12.5 mg po BID.    He is here today for follow up. He denies chest pain. Breathing is at baseline. No LE edema. No near syncope or syncope. He has no awareness of palpitations.   Primary Care Physician: Rogelia Mire  Past Medical History  Diagnosis Date  . Emphysema   . Allergic rhinitis   . Asthma   . COPD (chronic obstructive pulmonary disease)   . Recurrent upper respiratory infection (URI)     coughing from chronic sinusitis   . Normal cardiac stress test     reports stress test was several yrs. ago /w [employer]. Dr. Rogelia Mire, WNL   . OSA (obstructive sleep apnea) 2007    CPAP- report of settings on paper chart, seen by [facility] - seen 07/2011   . Renal stone     some passed spontaneous, & also had cystoscopy  . Neuromuscular disorder     reports shaking, somedays "can't hold spoon" - generalized  shakiness, is going to talk to MD about it at next appt.   . High cholesterol   . Shortness of breath on exertion   . H/O hiatal hernia   . Chronic renal insufficiency, stage III (moderate)   . Mixed hyperlipidemia   . PE (pulmonary embolism) 10/2011  . Bleeding gastric ulcer   . Allergy   . Sleep apnea     wears c-pap  . Duodenum ulcer     with perforation  . Blood transfusion without reported diagnosis   . Cataract  bilateral removed  . Diabetes mellitus without complication     no meds- boarderline  . GERD (gastroesophageal reflux disease)   . Myocardial infarction   . Prostate cancer 1992    in remission.  s/p XRT, melanoma under left eye, left temple  . Hypertension   . DVT of axillary vein, acute left 10/24/2014    Past Surgical History  Procedure Laterality Date  . Lung surgery  1986    presumed from RLL. Segmentectomy. Benign Nodule.   . Sinus endo w/fusion  sch. 09/15/2011    Procedure: ENDOSCOPIC SINUS SURGERY WITH FUSION NAVIGATION;  Surgeon: Ascencion Dike, MD;  Location: [place];  Service: ENT;  Laterality: Bilateral;  bilateral maxillary antrostomy,  bilateral endoscopic ethmoidectomy, bilateral sphenoidectomy, bilateral frontal recess exploration  . Septoplasty  09/15/11  . Cataract extraction w/ intraocular lens  implant, bilateral  ~ 2000  . Ureteroscopy  1980's  . Sinus endo w/fusion  09/15/2011    Procedure: ENDOSCOPIC SINUS SURGERY WITH FUSION NAVIGATION;  Surgeon: Ascencion Dike, MD;  Location: [place];  Service: ENT;  Laterality: Bilateral;  . Maxillary antrostomy  09/15/2011    Procedure: MAXILLARY ANTROSTOMY;  Surgeon: Ascencion Dike, MD;  Location: [facility] OR;  Service: ENT;  Laterality: Bilateral;  TOTAL MAXILLARY ANTROSTOMY  . Sinus exploration  09/15/2011    Procedure: SINUS EXPLORATION;  Surgeon: Ascencion Dike, MD;  Location: [facility] OR;  Service: ENT;  Laterality: Bilateral;  FRONTAL RECESS EXPLORATION  . Sphenoidectomy  09/15/2011    Procedure: [place]alee P[place];  Surgeon: Ascencion Dike, MD;  Location: [place];  Service: ENT;  Laterality: Bilateral;  . Septoplasty  09/15/2011    Procedure: SEPTOPLASTY;  Surgeon: Ascencion Dike, MD;  Location: [place];  Service: ENT;  Laterality: Bilateral;  . Laparotomy N/A 08/28/2013    Procedure: EXPLORATORY LAPAROTOMY;  Surgeon: Rolm Bookbinder, MD;  Location: [place];  Service: General;  Laterality: N/A;  . Repair of perforated ulcer N/A 08/28/2013    Procedure: DUODENAL ULCER PATCH;  Surgeon: Rolm Bookbinder, MD;  Location: [place];  Service: General;  Laterality: N/A;  . Gastrostomy N/A 08/28/2013    Procedure: GASTROSTOMY;  Surgeon: Rolm Bookbinder, MD;  Location: [place];  Service: General;  Laterality: N/A;  . Esophagogastroduodenoscopy N/A 08/28/2013    Procedure: ESOPHAGOGASTRODUODENOSCOPY (EGD);  Surgeon: Gayland Curry, MD;  Location: [place];  Service: General;  Laterality: N/A;  . Melanoma excision Left     under left eye  . Esophagogastroduodenoscopy N/A 04/03/2014    Procedure: ESOPHAGOGASTRODUODENOSCOPY (EGD);  Surgeon: Inda Castle, MD;  [facility];  Service: Endoscopy;  Laterality: N/A;  . Colonoscopy  N/A 04/03/2014    Procedure: COLONOSCOPY;  Surgeon: Inda Castle, MD;  Location: [facility];  Service: Endoscopy;  Laterality: N/A;  . Lobectomy Right 1986    1/3 removed     Current Outpatient Prescriptions  Medication Sig Dispense Refill  . Aclidinium Bromide (TUDORZA PRESSAIR) 400 MCG/ACT AEPB Inhale 1 puff into the lungs 2 (two) times daily. 1 each 5  . albuterol (VENTOLIN HFA) 108 (90 BASE) MCG/ACT inhaler Inhale 2 puffs into the lungs every 6 (six) hours as needed for wheezing or shortness of breath. 1 Inhaler 2  . apixaban (ELIQUIS) 5 MG TABS tablet Take 1 tablet (5 mg total) by mouth 2 (two) times daily. 60 tablet 0  . atorvastatin (LIPITOR) 10 MG tablet Take 10 mg by mouth daily at 6 PM.     .  budesonide-formoterol (SYMBICORT) 160-4.5 MCG/ACT inhaler Inhale 2 puffs into the lungs 2 (two) times daily. 1 Inhaler 6  . esomeprazole (NEXIUM) 40 MG capsule Take 40 mg by mouth daily.     . fluocinonide cream (LIDEX) 3.53 % Apply 1 application topically daily as needed (rash). To infected area.    . fluticasone (FLONASE) 50 MCG/ACT nasal spray Place 2 sprays into the nose at bedtime. Each nostril once a day.    . ketoconazole (NIZORAL) 2 % shampoo Apply 1 application topically 2 (two) times a week.     . loratadine (CLARITIN) 10 MG tablet Take 10 mg by mouth daily.    . metoprolol succinate (TOPROL XL) 25 MG 24 hr tablet Take 0.5 tablets (12.5 mg total) by mouth 2 (two) times daily. 30 tablet 6  . nitroGLYCERIN (NITROSTAT) 0.4 MG SL tablet Place 0.4 mg under the tongue every 5 (five) minutes as needed for chest pain.    . phenol (CHLORASEPTIC) 1.4 % LIQD Use as directed 1 spray in the mouth or throat as needed for throat irritation / pain.    . tamsulosin (FLOMAX) 0.4 MG CAPS capsule Take 0.4 mg by mouth daily.     No current facility-administered medications for this visit.    Allergies  Allergen Reactions  . Mometasone Furoate Other (See Comments)    Nasonex: CAUSED RED,ITCHY  EYES; "lost my eyelashes"   . Naproxen Sodium Nausea And Vomiting  . Nsaids Other (See Comments)    Stomach ulcers  . Anoro Ellipta [Umeclidinium-Vilanterol]     Double vision  . Aspirin Hives and Nausea And Vomiting     Chills, sweats  . Breo Ellipta [Fluticasone Furoate-Vilanterol] Hives  . Dexilant [Dexlansoprazole] Diarrhea    Cramps   . Omnaris [Ciclesonide] Other (See Comments)    Nose bleed   . Spiriva Handihaler [Tiotropium Bromide Monohydrate] Other (See Comments)    Hiccups     History   Social History  . Marital Status: Married    Spouse Name: N/A  . Number of Children: 4  . Years of Education: N/A   Occupational History  . 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     Social History Main Topics  . Smoking status: Former Smoker -- 1.00 packs/day for 60 years    Types: Cigarettes    Quit date: 08/06/2011  . Smokeless tobacco: Never Used  . Alcohol Use: 0.0 oz/week     Comment: rarely  . Drug Use: No  . Sexual Activity: No   Other Topics Concern  . Not on file   Social History Narrative   Traveled to [place].   Married and lives with wife.   Originally from [place].   Spends winters in [place]   Has 2 kids in [place]          Family History  Problem Relation Age of Onset  . Prostate cancer Brother   . Prostate cancer Brother   . Prostate cancer Brother   . Asthma Father   . Heart disease Mother     MI age 67  . Heart attack Mother   . Anesthesia problems Neg Hx   . Colon cancer Neg Hx   . Esophageal cancer Neg Hx   . Rectal cancer Neg Hx   . Stomach cancer Neg Hx   . Stroke Sister     Review of Systems:  As stated in the HPI and otherwise negative.   BP 115/68 mmHg  Pulse 64  Ht 5\' 8"  (1.727 m)  Wt  166 lb 6.4 oz (75.479 kg)  BMI 25.31 kg/m2  SpO2 98%  Physical Examination: General: Well developed, well nourished, NAD HEENT: OP clear, mucus membranes moist SKIN: warm, dry. No rashes. Neuro: No focal deficits Musculoskeletal: Muscle strength 5/5 all  ext Psychiatric: Mood and affect normal Neck: No JVD, no carotid bruits, no thyromegaly, no lymphadenopathy. Lungs:Clear bilaterally, no wheezes, rhonci, crackles Cardiovascular: Regular rate and rhythm. No murmurs, gallops or rubs. Abdomen:Soft. Bowel sounds present. Non-tender.  Extremities: No lower extremity edema. Pulses are 2 + in the bilateral DP/PT.  Echo 10/24/14: - Left ventricle: The cavity size was normal. Wall thickness was normal. Systolic function was normal. The estimated ejection fraction was in the range of 55% to 60%. Doppler parameters are consistent with abnormal left ventricular relaxation (grade 1 diastolic dysfunction). The E/e&' ratio is between 8-15, suggesting indeterminate LV filling pressure. - Aortic valve: Sclerosis without stenosis. Transvalvular velocity was minimally increased. There was no regurgitation. - Mitral valve: Mildly thickened leaflets . There was mild regurgitation. - Left atrium: Moderately dilated at 38 ml/m2. Impressions: - Compared to the prior study in 2013, there has been no change. No significant TR, therefore RVSP cannot be estimated.  EKG:  EKG is ordered today The ekg ordered today demonstrates Sinus, rate 64 bpm. PVCs   Recent Labs: 10/23/2014: Pro B Natriuretic peptide (BNP) 632.0* 10/25/2014: BUN 21*; Creatinine, Ser 1.40*; Magnesium 1.7; Potassium 4.6; Sodium 141 10/28/2014: Hemoglobin 12.6*; Platelets 238   Lipid Panel    Component Value Date/Time   TRIG 56 08/28/2013 1620     Wt Readings from Last 3 Encounters:  11/28/14 166 lb 6.4 oz (75.479 kg)  11/22/14 164 lb 12.8 oz (74.753 kg)  11/13/14 165 lb (74.844 kg)     Other studies Reviewed: Additional studies/ records that were reviewed today include:  Review of the above records demonstrates:    Assessment and Plan:   1. NSTEMI/CAD: NSTEMI in [facility] 9/15 but cath delayed due to GI bleeding, gastric ulcers. Recent GI workup here locally per Dr.  Deatra Ina with ulcer noted duodenum and evidence of non-bleeding diverticulosis. Most recent EGD may 2016 showed the ulcer had healed. He has had no further GI bleeding. Unfortunately has been diagnosed with DVT/PE in may 2016 and is now on Eliquis. Will not plan cardiac cath at this time. He has no chest pain. Will continue statin, ARB and beta blocker.    2. HLD: continue statin.   3. DVT/PE: Per primary care. On Eliquis.    4. PVCs with bigeminy: HR is stable on Toprol. Occasional PVCs. He is feeling great. With hypotension will not restart Losartan.   Current medicines are reviewed at length with the patient today.  The patient does not have concerns regarding medicines.  The following changes have been made:  no change  Labs/ tests ordered today include:   Orders Placed This Encounter  Procedures  . EKG 12-Lead    Disposition:   FU with me in 6 months  Signed, Lauree Chandler, MD 11/28/2014 10:31 AM    [facility] [place], [place], [place]  [address] Phone: [phone]; Fax: [phone]

## 2014-12-13 ENCOUNTER — Other Ambulatory Visit: Payer: Self-pay | Admitting: *Deleted

## 2014-12-13 ENCOUNTER — Encounter: Payer: Self-pay | Admitting: *Deleted

## 2014-12-13 NOTE — Patient Outreach (Signed)
[place] [facility]) Care Management  12/13/2014  Jon White 1932-06-24 [id number]   Referral from [facility] list: Telephone call to patient who was advised of reason for call and of [facility] care management services. Patient voices that he feels good about status of health at this time and that conditions  are being managed and are under control.   States she has just recently seen primary care provider and sees other specialist as scheduled. No problems getting to appointments. States he and spouse still drive.  States he has support from family if needed.   Patient states getting prescriptions filled without problems. States he has prescription coverage and uses local pharmacy. Patient counseled on medication adherence. States she manages own medications and takes them as prescribed by his doctors. Patient voices understanding of medication adherence and voices reason he is taking medications.  Patient voices knowledge of COPD action plan.  Patient declining services of [facility] care management at this time. No case management needs assessed at this time.   Plan: Will close case; send MD closure letter.  Sherrin Daisy, RN BSN [place] Management Coordinator [facility] Care Management  [phone]

## 2014-12-17 NOTE — Patient Outreach (Signed)
[place] [facility]) Care Management  12/17/2014  Jon White 05/19/1933 [id number]   Notification from Sherrin Daisy, RN to close case due to patient assessed and no further interventions needed.  [name]. [name], [place] Management [place] Assistant Phone: [phone] Fax: [phone]

## 2014-12-30 DIAGNOSIS — H04123 Dry eye syndrome of bilateral lacrimal glands: Secondary | ICD-10-CM | POA: Diagnosis not present

## 2014-12-30 DIAGNOSIS — Z961 Presence of intraocular lens: Secondary | ICD-10-CM | POA: Diagnosis not present

## 2015-01-13 ENCOUNTER — Ambulatory Visit: Payer: Medicare Other | Admitting: Internal Medicine

## 2015-01-20 ENCOUNTER — Telehealth: Payer: Self-pay | Admitting: Internal Medicine

## 2015-01-20 DIAGNOSIS — G4733 Obstructive sleep apnea (adult) (pediatric): Secondary | ICD-10-CM

## 2015-01-20 NOTE — Telephone Encounter (Signed)
Called [facility] and had to [facility]

## 2015-01-21 NOTE — Telephone Encounter (Signed)
Kelli with [facility] called [phone].

## 2015-01-21 NOTE — Telephone Encounter (Signed)
Attempted to call - rang busy [facility]

## 2015-01-22 NOTE — Telephone Encounter (Signed)
Spoke with Vida Roller with [facility] in [place] and they need order sent for Cpap supplies while pt is visiting there.  Needs Dr's npi and dx code on order.  Ok to fax order.  Will forward to [place] to complete.

## 2015-01-22 NOTE — Telephone Encounter (Signed)
Order was sent to [facility] 

## 2015-01-22 NOTE — Telephone Encounter (Signed)
i need an order placed to fax to them Jon White

## 2015-02-04 DIAGNOSIS — Z23 Encounter for immunization: Secondary | ICD-10-CM | POA: Diagnosis not present

## 2015-03-12 NOTE — Telephone Encounter (Signed)
Nexium sent to RX. See other phone note

## 2015-03-24 DIAGNOSIS — C61 Malignant neoplasm of prostate: Secondary | ICD-10-CM | POA: Diagnosis not present

## 2015-03-25 DIAGNOSIS — J330 Polyp of nasal cavity: Secondary | ICD-10-CM | POA: Diagnosis not present

## 2015-03-25 DIAGNOSIS — J310 Chronic rhinitis: Secondary | ICD-10-CM | POA: Diagnosis not present

## 2015-03-26 ENCOUNTER — Other Ambulatory Visit: Payer: Self-pay | Admitting: Cardiovascular Disease

## 2015-03-26 DIAGNOSIS — E782 Mixed hyperlipidemia: Secondary | ICD-10-CM | POA: Diagnosis not present

## 2015-03-26 NOTE — Telephone Encounter (Signed)
Patients chart indicates that the patient is taking metoprolol succinate, but the pharmacy is requesting metoprolol tartrate. Please advise. Thanks, MI

## 2015-03-27 NOTE — Telephone Encounter (Signed)
Reviewed with Dr. Angelena Form and we should refill as he has been taking.  I spoke with pt who checked his medications and confirmed he is taking metoprolol tartrate 12.5 mg by mouth twice daily.  He is not taking Toprol.  Will refill metoprolol tartrate and update med list.

## 2015-03-31 DIAGNOSIS — R3913 Splitting of urinary stream: Secondary | ICD-10-CM | POA: Diagnosis not present

## 2015-03-31 DIAGNOSIS — N200 Calculus of kidney: Secondary | ICD-10-CM | POA: Diagnosis not present

## 2015-03-31 DIAGNOSIS — C61 Malignant neoplasm of prostate: Secondary | ICD-10-CM | POA: Diagnosis not present

## 2015-04-01 DIAGNOSIS — I2699 Other pulmonary embolism without acute cor pulmonale: Secondary | ICD-10-CM | POA: Diagnosis not present

## 2015-04-01 DIAGNOSIS — I2510 Atherosclerotic heart disease of native coronary artery without angina pectoris: Secondary | ICD-10-CM | POA: Diagnosis not present

## 2015-04-01 DIAGNOSIS — K269 Duodenal ulcer, unspecified as acute or chronic, without hemorrhage or perforation: Secondary | ICD-10-CM | POA: Diagnosis not present

## 2015-04-01 DIAGNOSIS — E782 Mixed hyperlipidemia: Secondary | ICD-10-CM | POA: Diagnosis not present

## 2015-04-22 ENCOUNTER — Other Ambulatory Visit: Payer: Self-pay | Admitting: Gastroenterology

## 2015-04-22 ENCOUNTER — Other Ambulatory Visit: Payer: Self-pay | Admitting: Cardiovascular Disease

## 2015-04-28 ENCOUNTER — Telehealth: Payer: Self-pay | Admitting: Gastroenterology

## 2015-04-28 ENCOUNTER — Encounter: Payer: Self-pay | Admitting: Internal Medicine

## 2015-04-28 ENCOUNTER — Ambulatory Visit (INDEPENDENT_AMBULATORY_CARE_PROVIDER_SITE_OTHER): Payer: Medicare Other | Admitting: Internal Medicine

## 2015-04-28 ENCOUNTER — Telehealth: Payer: Self-pay | Admitting: Internal Medicine

## 2015-04-28 ENCOUNTER — Other Ambulatory Visit: Payer: Self-pay | Admitting: Gastroenterology

## 2015-04-28 VITALS — BP 136/70 | HR 35 | Ht 68.0 in | Wt 167.0 lb

## 2015-04-28 DIAGNOSIS — I2510 Atherosclerotic heart disease of native coronary artery without angina pectoris: Secondary | ICD-10-CM | POA: Diagnosis not present

## 2015-04-28 DIAGNOSIS — R001 Bradycardia, unspecified: Secondary | ICD-10-CM

## 2015-04-28 DIAGNOSIS — I2699 Other pulmonary embolism without acute cor pulmonale: Secondary | ICD-10-CM | POA: Diagnosis not present

## 2015-04-28 DIAGNOSIS — J449 Chronic obstructive pulmonary disease, unspecified: Secondary | ICD-10-CM | POA: Diagnosis not present

## 2015-04-28 DIAGNOSIS — I493 Ventricular premature depolarization: Secondary | ICD-10-CM

## 2015-04-28 MED ORDER — CEPHALEXIN 500 MG PO CAPS: 500.0000 mg | ORAL_CAPSULE | Freq: Three times a day (TID) | ORAL | Status: DC

## 2015-04-28 MED ORDER — ESOMEPRAZOLE MAGNESIUM 40 MG PO CPDR: 40.0000 mg | DELAYED_RELEASE_CAPSULE | Freq: Every day | ORAL | Status: DC

## 2015-04-28 NOTE — Progress Notes (Signed)
Subjective:     Patient ID: Jon White, male   DOB: February 07, 1933, 81 y.o.   MRN: [id number]  HPI    OV 10/23/2014  Chief Complaint  Patient presents with  . Follow-up    pt c/o more mucus production yellow to clear and sob with exhertion. Pt denies cough and or wheezing and chest tightness.   Follow-up he did presents with his wife. Both give history.  Gold stage II-3 COPD: He is compliant with his triple inhaler therapy. There are no new problems. There is no exacerbation between last visit and current visit  New issue: He is reporting worsening shortness of breath for the last few months. He notices it when he climbs stairs. Relieved by rest. Moderate in intensity. No sustained chest pain. There is no associated worsening of cough but there is some increased sputum in terms of volume but no change in baseline color. No associated wheezing or chest pain. He is chronic pedal edema that is unchanged from baseline. He has associated coronary artery disease. He did have heart attack in summer/fall 2015 in [place]. [name] Kitchen He is due to see Dr. Julianne Handler again and according to the patient and his wife a cardiac stress test has been planned. His last echocardiogram was EF 45% in [place] in September 2015. This is a drop and a change from his baseline. In addition his most recent hemoglobin showed mild anemia with a hemoglobin of 11 g percent in October 2015.  Pulmonary embolism: He completed treatment between summer 2013 and summer 2015  years. We had to stop this after GI bleed in [place] where according to the patient reports he had multiple gastric ulcers. I then referred him to local gastroenterologist. I review the old chart. I notice that he still had residual also in his first endoscopy. He then had repeat endoscopy a few weeks ago by Dr. Deatra Ina and according to his history and wife's history the ulcers have been cleared.   11/13/2014 [facility] follow up  Pt returns for post hosp follow up   Admitted 5/26 to 5/30 for DVT , and PE  VQ positive for PE .  Hx of life threatening bleed with duodenal ucler perforation .  Underwent EGD on 10/10/14 which showed healed ulcer.  He was treated with Hep /Coumadin then transitioned to Eliquis .  He was tx with PPI with GI follow up in OP setting.  Needs help with Eliquis rx as wants rx to [facility] not [facility] .  Does need prior aurthorization with insurance.  Has been seen by PCP with labs done last week with stable HBG.  Says he is feeling good but gets lightheaded at times with standing  B/p is on low end of normal . Has bradycardia on metoprolol     OV 04/28/2015  Chief Complaint  Patient presents with  . Follow-up    Pt following for COPD: pt states he is doing pretty well, breathing is about the same since he last been here.  pt c/o prod cough with lots of phlem yellowish in color, and wheezing. no c/o chest tightness, and some leg swelling.  pt using inhailer with relief. pt requesting sample of eliquis and  symbicort.    Follow-up COPD: This is stable. He still a significant dyspnea. He has a cardiology follow-up pending in January 2017. He has not started pulmonary rehabilitation and he is awaiting cardiac clearance. He continues with triple inhaler therapy. He is having some cough with  yellow phlegm and subjective wheezing but no chest tightness.  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 Therefore he is on lifelong anticoagulation. He is now on Eliquis. He is asking for help affording the medication.  New issue: Nurse vital check showed that he significantly bradycardic heart rate 35. Review of the chart shows that when he saw my nurse practitioner he was complaining of low blood pressure and she did cut down his blood pressure medications of Lopressor and Cozaar dosing. He says he has never been diagnosed with bradycardia in the past. This a new finding. He is reports on and off dizziness  for the last few days. This is new. It is mild. He did bump his ankle on the left side but it is not because of dizziness or fall. He has not had any falls. No presyncope. Twelve-lead EKG today shows -heart rate 71 bigemini as discussed on the phone with Dr. Dorris Carnes    Immunization History  Administered Date(s) Administered  . Influenza Split 02/26/2013, 02/21/2014, 02/04/2015  . Influenza Whole 03/31/2012  . Pneumococcal Polysaccharide-23 08/30/2003     Current outpatient prescriptions:  .  Aclidinium Bromide (TUDORZA PRESSAIR) 400 MCG/ACT AEPB, Inhale 1 puff into the lungs 2 (two) times daily., Disp: 1 each, Rfl: 5 .  albuterol (VENTOLIN HFA) 108 (90 BASE) MCG/ACT inhaler, Inhale 2 puffs into the lungs every 6 (six) hours as needed for wheezing or shortness of breath., Disp: 1 Inhaler, Rfl: 2 .  apixaban (ELIQUIS) 5 MG TABS tablet, Take 1 tablet (5 mg total) by mouth 2 (two) times daily., Disp: 60 tablet, Rfl: 0 .  atorvastatin (LIPITOR) 10 MG tablet, Take 10 mg by mouth daily at 6 PM. , Disp: , Rfl:  .  budesonide-formoterol (SYMBICORT) 160-4.5 MCG/ACT inhaler, Inhale 2 puffs into the lungs 2 (two) times daily., Disp: 1 Inhaler, Rfl: 6 .  esomeprazole (NEXIUM) 40 MG capsule, Take 40 mg by mouth daily. , Disp: , Rfl:  .  fluocinonide cream (LIDEX) [id number] %, Apply 1 application topically daily as needed (rash). To infected area., Disp: , Rfl:  .  fluticasone (FLONASE) 50 MCG/ACT nasal spray, Place 2 sprays into the nose at bedtime. Each nostril once a day., Disp: , Rfl:  .  ketoconazole (NIZORAL) 2 % shampoo, Apply 1 application topically 2 (two) times a week. , Disp: , Rfl:  .  loratadine (CLARITIN) 10 MG tablet, Take 10 mg by mouth daily., Disp: , Rfl:  .  metoprolol tartrate (LOPRESSOR) 25 MG tablet, TAKE 1/2 TABLET BY MOUTH TWICE DAILY, Disp: 30 tablet, Rfl: 7 .  nitroGLYCERIN (NITROSTAT) 0.4 MG SL tablet, Place 0.4 mg under the tongue every 5 (five) minutes as needed for chest pain.,  Disp: , Rfl:  .  phenol (CHLORASEPTIC) 1.4 % LIQD, Use as directed 1 spray in the mouth or throat as needed for throat irritation / pain., Disp: , Rfl:  .  tamsulosin (FLOMAX) 0.4 MG CAPS capsule, Take 0.4 mg by mouth daily., Disp: , Rfl:   Allergies  Allergen Reactions  . Mometasone Furoate Other (See Comments)    Nasonex: CAUSED RED,ITCHY EYES; "lost my eyelashes"   . Naproxen Sodium Nausea And Vomiting  . Nsaids Other (See Comments)    Stomach ulcers  . Anoro Ellipta [Umeclidinium-Vilanterol]     Double vision  . Aspirin Hives and Nausea And Vomiting     Chills, sweats  . Breo Ellipta [Fluticasone Furoate-Vilanterol] Hives  . Dexilant [  Dexlansoprazole] Diarrhea    Cramps   . Omnaris [Ciclesonide] Other (See Comments)    Nose bleed   . Spiriva Handihaler [Tiotropium Bromide Monohydrate] Other (See Comments)    Hiccups       Review of Systems  According to history of present illness    Objective:   Physical Exam  Constitutional: He is oriented to person, place, and time. He appears well-developed and well-nourished. No distress.  HENT:  Head: Normocephalic and atraumatic.  Right Ear: External ear normal.  Left Ear: External ear normal.  Mouth/Throat: Oropharynx is clear and moist. No oropharyngeal exudate.  Chronic postnasal drip  Eyes: Conjunctivae and EOM are normal. Pupils are equal, round, and reactive to light. Right eye exhibits no discharge. Left eye exhibits no discharge. No scleral icterus.  Neck: Normal range of motion. Neck supple. No JVD present. No tracheal deviation present. No thyromegaly present.  Cardiovascular: Normal rate, regular rhythm and intact distal pulses.  Exam reveals no gallop and no friction rub.   No murmur heard. Bradycardia present  Pulmonary/Chest: Effort normal and breath sounds normal. No respiratory distress. He has no wheezes. He has no rales. He exhibits no tenderness.  Abdominal: Soft. Bowel sounds are normal. He exhibits no  distension and no mass. There is no tenderness. There is no rebound and no guarding.  Musculoskeletal: Normal range of motion. He exhibits no edema or tenderness.  Lymphadenopathy:    He has no cervical adenopathy.  Neurological: He is alert and oriented to person, place, and time. He has normal reflexes. No cranial nerve deficit. Coordination normal.  Skin: Skin is warm and dry. No rash noted. He is not diaphoretic. No erythema. No pallor.  Psychiatric: He has a normal mood and affect. His behavior is normal. Judgment and thought content normal.  Nursing note and vitals reviewed.   Filed Vitals:   04/28/15 1128  BP: 136/70  Pulse: 35  Height: 5\' 8"  (1.727 m)  Weight: 167 lb (75.751 kg)  SpO2: 97%        Assessment:       ICD-9-CM ICD-10-CM   1. Bradycardia 427.89 R00.1 EKG 12-Lead     EKG 12-Lead  2. Pulmonary embolism, other ([place])  I26.99   3. Chronic obstructive pulmonary disease, unspecified COPD, unspecified chronic bronchitis type 496 J44.9          Plan:      #bradycardia - New issue  - Take Lopressor 12.5 mg once daily in the morning only (do not take evening dose]  - cardiology 04/28/2015 will set you up with a Holter monitor and advise you about follow-up   #Pulmonary embolism  - if you are feeling dizzy or feel like falling, do not take eliquis till heart rate issue sorted out by cardiology - otherwise continue eliquis - need this life long   #COPD - There might be mild flare up - take cephalexin 500 mg 3 times daily 5 days - continue tudorza, symbicort scheduled daily  - albuterol as needed - address prevnar vaccine at followup -Hold off on pulmonary rehabilitation referral - til cardiology  clearance when you see them  #Followup  3 months with me or my NP     Dr. Brand Males, M.D., [facility].C.P Pulmonary and Critical Care Medicine Staff Physician [facility] Pager: [phone], If no answer or  between  15:00h - 7:00h: call [phone]  04/28/2015 11:57 AM

## 2015-04-28 NOTE — Addendum Note (Signed)
Addended by: Katrine Coho on: 04/28/2015 03:16 PM   Modules accepted: Orders

## 2015-04-28 NOTE — Telephone Encounter (Signed)
See earlier phone note from today

## 2015-04-28 NOTE — Telephone Encounter (Signed)
Called patient to inform med sent  

## 2015-04-28 NOTE — Patient Instructions (Addendum)
ICD-9-CM ICD-10-CM   1. Bradycardia 427.89 R00.1   2. Pulmonary embolism, other ([place])  I26.99   3. Chronic obstructive pulmonary disease, unspecified COPD, unspecified chronic bronchitis type 496 J44.9     #bradycardia - New issue  - Take Lopressor 12.5 mg once daily in the morning only (do not take evening dose]  - cardiology 04/28/2015 will set you up with a Holter monitor and advise you about follow-up   #Pulmonary embolism  - if you are feeling dizzy or feel like falling, do not take eliquis till heart rate issue sorted out by cardiology - otherwise continue eliquis - need this life long   #COPD - There might be mild flare up - take cephalexin 500 mg 3 times daily 5 days - continue tudorza, symbicort scheduled daily  - albuterol as needed - address prevnar vaccine at followup -Hold off on pulmonary rehabilitation referral - til cardiology  clearance when you see them  #Followup  3 months with me or my NP

## 2015-04-28 NOTE — Addendum Note (Signed)
Addended by: Maurice March on: 04/28/2015 12:35 PM   Modules accepted: Orders

## 2015-04-28 NOTE — Telephone Encounter (Signed)
Pt advised, verbalized understanding. Pt advised to expect call about monitor appointment.  Pt advised I will forward to Dr Camillia Herter nurse to follow up with him about a follow up appt with Dr Angelena Form.

## 2015-04-28 NOTE — Telephone Encounter (Signed)
Spoke to [employer] Pt in pulmonary clinic today  Pulse low  Pt had noticed some dizziness over the past few days. EKG done showed SR with ventricular bigeminy  60s   Pulse prob does not reflect PVCS and is coming in 30s / 40s  BP is OK in clinic   Rec;   Cut lopressor to 1x per day Sit if dizzy Will set up for 48 hour holter montor F/U soon with C McAlhany

## 2015-04-28 NOTE — Telephone Encounter (Signed)
F/u    Pt returning Anne's phone call.

## 2015-04-28 NOTE — Addendum Note (Signed)
Addended by: Katrine Coho on: 04/28/2015 03:05 PM   Modules accepted: Orders

## 2015-04-28 NOTE — Telephone Encounter (Signed)
LMTCB

## 2015-04-29 ENCOUNTER — Telehealth: Payer: Self-pay

## 2015-04-29 ENCOUNTER — Ambulatory Visit (INDEPENDENT_AMBULATORY_CARE_PROVIDER_SITE_OTHER): Payer: Medicare Other

## 2015-04-29 DIAGNOSIS — I493 Ventricular premature depolarization: Secondary | ICD-10-CM | POA: Diagnosis not present

## 2015-04-29 DIAGNOSIS — R001 Bradycardia, unspecified: Secondary | ICD-10-CM | POA: Diagnosis not present

## 2015-04-29 NOTE — Telephone Encounter (Signed)
Can we set him up to see me or office APP next week? cdm

## 2015-04-29 NOTE — Telephone Encounter (Signed)
Patient in today for 48 hour holter placement. Monitor tech st he was requesting Nitro refill sent to the pharmacy on file.

## 2015-04-30 ENCOUNTER — Other Ambulatory Visit: Payer: Self-pay | Admitting: Cardiovascular Disease

## 2015-04-30 ENCOUNTER — Telehealth: Payer: Self-pay | Admitting: Cardiovascular Disease

## 2015-04-30 MED ORDER — NITROGLYCERIN 0.4 MG SL SUBL: 0.4000 mg | SUBLINGUAL_TABLET | SUBLINGUAL | Status: DC | PRN

## 2015-04-30 MED ORDER — FUROSEMIDE 40 MG PO TABS: 40.0000 mg | ORAL_TABLET | Freq: Every day | ORAL | Status: DC

## 2015-04-30 NOTE — Telephone Encounter (Signed)
Reviewed with Dr. Angelena Form and pt should take 40 mg lasix daily until office visit next week. It will be determined at office visit if he should continue.  Pt notified. Will send prescription to [facility]

## 2015-04-30 NOTE — Telephone Encounter (Signed)
Refill sent.

## 2015-04-30 NOTE — Telephone Encounter (Signed)
New problem   Pt is wearing a heart monitor and his ankles is swollen Pt c/o swelling: STAT is pt has developed SOB within 24 hours  1. How long have you been experiencing swelling? 2weeks  2. Where is the swelling located? Both ankles  3.  Are you currently taking a "fluid pill"?No  4.  Are you currently SOB? COPD  5.  Have you traveled recently?no

## 2015-04-30 NOTE — Telephone Encounter (Signed)
Appt scheduled for pt to see Dr. Angelena Form on 05/07/15.

## 2015-04-30 NOTE — Telephone Encounter (Signed)
Spoke with pt. He reports swelling in both ankles for last 4 weeks.  Describes as skin feeling tight. Primary MD put pt on lasix for 3 days earlier this month for this.  Pt reports this did not help a lot.  He always has shortness of breath but this has not increased.  He notes tenderness in right calf area for one month. No bruising, pain or swelling.  Also notes bruising between great toe and 2nd toe on left foot.  This fades and goes away and then comes back.  Pt is taking Eliquis.  I scheduled pt for appt with Dr. Angelena Form on December 7,2016 for follow up on holter monitor. Pt is asking if he should go back on fluid pill for a few days.  Will forward to Dr. Angelena Form for review

## 2015-04-30 NOTE — Telephone Encounter (Signed)
Yes, I would resume Lasix until he sees me. Jon White

## 2015-05-02 ENCOUNTER — Ambulatory Visit (INDEPENDENT_AMBULATORY_CARE_PROVIDER_SITE_OTHER): Payer: Medicare Other | Admitting: Gastroenterology

## 2015-05-02 ENCOUNTER — Telehealth: Payer: Self-pay | Admitting: Internal Medicine

## 2015-05-02 ENCOUNTER — Encounter: Payer: Self-pay | Admitting: Gastroenterology

## 2015-05-02 VITALS — BP 130/70 | HR 60 | Ht 66.5 in | Wt 169.4 lb

## 2015-05-02 DIAGNOSIS — I2510 Atherosclerotic heart disease of native coronary artery without angina pectoris: Secondary | ICD-10-CM

## 2015-05-02 DIAGNOSIS — Z8719 Personal history of other diseases of the digestive system: Secondary | ICD-10-CM

## 2015-05-02 DIAGNOSIS — R1310 Dysphagia, unspecified: Secondary | ICD-10-CM

## 2015-05-02 MED ORDER — ESOMEPRAZOLE MAGNESIUM 40 MG PO CPDR
40.0000 mg | DELAYED_RELEASE_CAPSULE | Freq: Every day | ORAL | Status: AC
Start: 2015-05-02 — End: ?

## 2015-05-02 NOTE — Progress Notes (Signed)
Jon White    JA:[id number]    [birth year]  Primary Care Physician:RAMACHANDRAN,AJITH, MD  Referring Physician: Merrilee Seashore, MD [place], [place] [address]  Chief complaint:  Follow up duodenal ulcer  HPI: 81 yo male with history of PE/DVT, COPD, prostate cancer, OSA, HLD, chronic kidney disease and duodenal ulcer (H pylori negative) here for follow-up visit.  His last EGD was in May 2016 showed healed duodenal bulb ulcers and had Maloney dilation for esophageal stricture. His swallowing has improved status post dilation but occasionally has difficulty with cold beverages. No difficulty swallowing solids.He has no other specific GI complaints at today's visit, denies abdominal pain, nausea, vomiting or change in bowel habits. No melena or bright red blood per rectum.      Outpatient Encounter Prescriptions as of 05/02/2015  Medication Sig  . Aclidinium Bromide (TUDORZA PRESSAIR) 400 MCG/ACT AEPB Inhale 1 puff into the lungs 2 (two) times daily.  [name] Kitchen albuterol (VENTOLIN HFA) 108 (90 BASE) MCG/ACT inhaler Inhale 2 puffs into the lungs every 6 (six) hours as needed for wheezing or shortness of breath.  [name] Kitchen apixaban (ELIQUIS) 5 MG TABS tablet Take 1 tablet (5 mg total) by mouth 2 (two) times daily.  [name] Kitchen atorvastatin (LIPITOR) 10 MG tablet Take 10 mg by mouth daily at 6 PM.   . budesonide-formoterol (SYMBICORT) 160-4.5 MCG/ACT inhaler Inhale 2 puffs into the lungs 2 (two) times daily.  . cephALEXin (KEFLEX) 500 MG capsule Take 1 capsule (500 mg total) by mouth 3 (three) times daily.  [name] Kitchen esomeprazole (NEXIUM) 40 MG capsule Take 1 capsule (40 mg total) by mouth daily.  . fluocinonide cream (LIDEX) [id number] % Apply 1 application topically daily as needed (rash). To infected area.  . fluticasone (FLONASE) 50 MCG/ACT nasal spray Place 2 sprays into the nose at bedtime. Each nostril once a day.  . furosemide (LASIX) 40 MG tablet Take 1 tablet (40 mg total) by  mouth daily.  [name] Kitchen ketoconazole (NIZORAL) 2 % shampoo Apply 1 application topically 2 (two) times a week.   . loratadine (CLARITIN) 10 MG tablet Take 10 mg by mouth daily.  . metoprolol tartrate (LOPRESSOR) 25 MG tablet Take 0.5 tablets (12.5 mg total) by mouth daily.  . nitroGLYCERIN (NITROSTAT) 0.4 MG SL tablet PLACE 1 TABLET UNDER THE TONGUE EVERY 5 MINUTES AS NEEDED FOR CHEST PAIN  . phenol (CHLORASEPTIC) 1.4 % LIQD Use as directed 1 spray in the mouth or throat as needed for throat irritation / pain.  . tamsulosin (FLOMAX) 0.4 MG CAPS capsule Take 0.4 mg by mouth daily.  . [DISCONTINUED] esomeprazole (NEXIUM) 40 MG capsule TAKE 1 CAPSULE(40 MG) BY MOUTH DAILY   No facility-administered encounter medications on file as of 05/02/2015.    Allergies as of 05/02/2015 - Review Complete 05/02/2015  Allergen Reaction Noted  . Mometasone furoate Other (See Comments) 08/03/2011  . Naproxen sodium Nausea And Vomiting 09/08/2011  . Nsaids Other (See Comments) 04/03/2014  . Anoro ellipta [umeclidinium-vilanterol]  07/09/2013  . Aspirin Hives and Nausea And Vomiting 04/03/2009  . Breo ellipta [fluticasone furoate-vilanterol] Hives 03/13/2014  . Dexilant [dexlansoprazole] Diarrhea 09/26/2014  . Omnaris [ciclesonide] Other (See Comments) 03/25/2014  . Spiriva handihaler [tiotropium bromide monohydrate] Other (See Comments) 03/13/2014    Past Medical History  Diagnosis Date  . Emphysema   . Allergic rhinitis   . Asthma   . COPD (chronic obstructive pulmonary disease) ([place])   . Recurrent upper  respiratory infection (URI)     coughing from chronic sinusitis   . Normal cardiac stress test     reports stress test was several yrs. ago /w [employer]. Dr. Rogelia Mire, [facility]   . OSA (obstructive sleep apnea) 2007    CPAP- report of settings on paper chart, seen by [facility] - seen 07/2011   . Renal stone     some passed spontaneous, & also had cystoscopy  . Neuromuscular disorder ([place])     reports shaking,  somedays "can't hold spoon" - generalized  shakiness, is going to talk to MD about it at next appt.   . High cholesterol   . Shortness of breath on exertion   . H/O hiatal hernia   . Chronic renal insufficiency, stage III (moderate)   . Mixed hyperlipidemia   . PE (pulmonary embolism) 10/2011  . Bleeding gastric ulcer   . Allergy   . Sleep apnea     wears c-pap  . Duodenum ulcer     with perforation  . Blood transfusion without reported diagnosis   . Cataract     bilateral removed  . Diabetes mellitus without complication ([facility])     no meds- boarderline  . GERD (gastroesophageal reflux disease)   . Myocardial infarction ([facility])   . Prostate cancer ([place]) 1992    in remission.  s/p XRT, melanoma under left eye, left temple  . Hypertension   . DVT of axillary vein, acute left 10/24/2014    Past Surgical History  Procedure Laterality Date  . Lung surgery  1986    presumed from RLL. Segmentectomy. Benign Nodule.   . Sinus endo w/fusion  sch. 09/15/2011    Procedure: ENDOSCOPIC SINUS SURGERY WITH FUSION NAVIGATION;  Surgeon: Ascencion Dike, MD;  Location: [facility];  Service: ENT;  Laterality: Bilateral;  bilateral maxillary antrostomy, bilateral endoscopic ethmoidectomy, bilateral sphenoidectomy, bilateral frontal recess exploration  . Septoplasty  09/15/11  . Cataract extraction w/ intraocular lens  implant, bilateral  ~ 2000  . Ureteroscopy  1980's  . Sinus endo w/fusion  09/15/2011    Procedure: ENDOSCOPIC SINUS SURGERY WITH FUSION NAVIGATION;  Surgeon: Ascencion Dike, MD;  Location: [facility];  Service: ENT;  Laterality: Bilateral;  . Maxillary antrostomy  09/15/2011    Procedure: MAXILLARY ANTROSTOMY;  Surgeon: Ascencion Dike, MD;  Location: [facility] OR;  Service: ENT;  Laterality: Bilateral;  TOTAL MAXILLARY ANTROSTOMY  . Sinus exploration  09/15/2011    Procedure: SINUS EXPLORATION;  Surgeon: Ascencion Dike, MD;  Location[facility];  Service: ENT;  Laterality: Bilateral;  FRONTAL RECESS  EXPLORATION  . Sphenoidectomy  09/15/2011    Procedure: Coralee Pesa;  Surgeon: Ascencion Dike, MD;  Location: [facility];  Service: ENT;  Laterality: Bilateral;  . Septoplasty  09/15/2011    Procedure: SEPTOPLASTY;  Surgeon: Ascencion Dike, MD;  Location: [facility];  Service: ENT;  Laterality: Bilateral;  . Laparotomy N/A 08/28/2013    Procedure: EXPLORATORY LAPAROTOMY;  Surgeon: Rolm Bookbinder, MD;  Location: [facility];  Service: General;  Laterality: N/A;  . Repair of perforated ulcer N/A 08/28/2013    Procedure: DUODENAL ULCER PATCH;  Surgeon: Rolm Bookbinder, MD;  Location: [facility];  Service: General;  Laterality: N/A;  . Gastrostomy N/A 08/28/2013    Procedure: GASTROSTOMY;  Surgeon: Rolm Bookbinder, MD;  Location: [facility];  Service: General;  Laterality: N/A;  . Esophagogastroduodenoscopy N/A 08/28/2013    Procedure: ESOPHAGOGASTRODUODENOSCOPY (EGD);  Surgeon: Gayland Curry, MD;  Location: [facility] OR;  Service: General;  Laterality: N/A;  . Melanoma excision Left     under left eye  . Esophagogastroduodenoscopy N/A 04/03/2014    Procedure: ESOPHAGOGASTRODUODENOSCOPY (EGD);  Surgeon: Inda Castle, MD;  Location: [facility];  Service: Endoscopy;  Laterality: N/A;  . Colonoscopy N/A 04/03/2014    Procedure: COLONOSCOPY;  Surgeon: Inda Castle, MD;  Location: [facility];  Service: Endoscopy;  Laterality: N/A;  . Lobectomy Right 1986    1/3 removed     Family History  Problem Relation Age of Onset  . Prostate cancer Brother   . Prostate cancer Brother   . Prostate cancer Brother   . Asthma Father   . Heart disease Mother     MI age 80  . Heart attack Mother   . Anesthesia problems Neg Hx   . Colon cancer Neg Hx   . Esophageal cancer Neg Hx   . Rectal cancer Neg Hx   . Stomach cancer Neg Hx   . Stroke Sister     Social History   Social History  . Marital Status: Married    Spouse Name: N/A  . Number of Children: 4  . Years of Education: N/A   Occupational History  . 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     Social History Main Topics  . Smoking status: Former Smoker -- 1.00 packs/day for 60 years    Types: Cigarettes    Quit date: 08/06/2011  . Smokeless tobacco: Never Used  . Alcohol Use: 0.0 oz/week     Comment: rarely  . Drug Use: No  . Sexual Activity: No   Other Topics Concern  . Not on file   Social History Narrative   Traveled to [place].   Married and lives with wife.   Originally from [place].   Spends winters in [place]   Has 2 kids in [place]            Review of systems: Review of Systems  Constitutional: Negative for fever and chills.  HENT: Negative.   Eyes: Negative for blurred vision.  Respiratory: Negative for cough, shortness of breath and wheezing.   Cardiovascular: Negative for chest pain and palpitations.  Gastrointestinal: as per HPI Genitourinary: Negative for dysuria, urgency, frequency and hematuria.  Musculoskeletal: Negative for myalgias, back pain and joint pain.  Skin: Negative for itching and rash.  Neurological: Negative for dizziness, tremors, focal weakness, seizures and loss of consciousness.  Endo/Heme/Allergies: Negative for environmental allergies.  Psychiatric/Behavioral: Negative for depression, suicidal ideas and hallucinations.  All other systems reviewed and are negative.   Physical Exam: Filed Vitals:   05/02/15 1517  BP: 130/70  Pulse: 60   Gen:      No acute distress HEENT:  EOMI, sclera anicteric Neck:     No masses; no thyromegaly Lungs:    Decreased breath sounds b/l with scattered wheezes; normal respiratory effort CV:         Regular rate and rhythm Abd:      + bowel sounds; soft, non-tender; no palpable masses, no distension Ext:    + edema; adequate peripheral perfusion Skin:      Warm and dry; no rash Neuro: alert and oriented x 3 Psych: normal mood and affect  Data Reviewed: EGD May 2016 The area of previous ulceration of the duodenal bulb was healed. The mucosa was slightly pale. The site of a  graft was also seen in the duodenal bulb. Slight narrowing of the apex of the bulb  but allowed passage of the duodenal scope. Except for the findings listed, the EGD was otherwise normal. . A small hiatal hernia was present. Retroflexed views revealed no abnormalities. The scope was then withdrawn from the patient. A #54 Isabell Jarvis dilator was passed with mild resistance. There was no heme.   Assessment and Plan/Recommendations:  81 yo male with history of PE/DVT, COPD, prostate cancer, OSA, HLD, chronic kidney disease and duodenal ulcer (H pylori negative) here for follow-up visit. Continue PPI daily, H. pylori was negative on biopsies. Complaints of difficulty swallowing or spasm with cold beverages, advise patient to drink room temperature water; warm soup at the start the meal and avoid cold beverages if he can. He could potentially have esophageal dysmotility, will hold off esophageal manometry or further evaluation at this point. No solid dysphagia, and his swallowing has improved since last EGD with [facility] dilation. Return as needed  K. Denzil Magnuson , MD [phone] Mon-Fri 8a-5p [phone] after 5p, weekends, holidays

## 2015-05-02 NOTE — Telephone Encounter (Signed)
Pt aware. Nothing further needed at this time.  °

## 2015-05-02 NOTE — Telephone Encounter (Signed)
LMTCB x1 1 sample of symbicort left for pick up

## 2015-05-02 NOTE — Patient Instructions (Signed)
Follow up in 1 year We refilled your Nexium today

## 2015-05-07 ENCOUNTER — Ambulatory Visit (INDEPENDENT_AMBULATORY_CARE_PROVIDER_SITE_OTHER): Payer: Medicare Other | Admitting: Cardiovascular Disease

## 2015-05-07 ENCOUNTER — Encounter: Payer: Self-pay | Admitting: Cardiovascular Disease

## 2015-05-07 VITALS — BP 110/58 | HR 57 | Ht 66.0 in | Wt 166.4 lb

## 2015-05-07 DIAGNOSIS — I493 Ventricular premature depolarization: Secondary | ICD-10-CM

## 2015-05-07 DIAGNOSIS — I2510 Atherosclerotic heart disease of native coronary artery without angina pectoris: Secondary | ICD-10-CM | POA: Diagnosis not present

## 2015-05-07 DIAGNOSIS — I509 Heart failure, unspecified: Secondary | ICD-10-CM | POA: Diagnosis not present

## 2015-05-07 DIAGNOSIS — I5032 Chronic diastolic (congestive) heart failure: Secondary | ICD-10-CM

## 2015-05-07 DIAGNOSIS — E785 Hyperlipidemia, unspecified: Secondary | ICD-10-CM | POA: Diagnosis not present

## 2015-05-07 DIAGNOSIS — M79605 Pain in left leg: Secondary | ICD-10-CM

## 2015-05-07 LAB — BASIC METABOLIC PANEL
BUN: 27 mg/dL — AB (ref 7–25)
CALCIUM: 9.6 mg/dL (ref 8.6–10.3)
CO2: 26 mmol/L (ref 20–31)
Chloride: 100 mmol/L (ref 98–110)
Creat: 1.62 mg/dL — ABNORMAL HIGH (ref 0.70–1.11)
GLUCOSE: 105 mg/dL — AB (ref 65–99)
Potassium: 4.2 mmol/L (ref 3.5–5.3)
SODIUM: 136 mmol/L (ref 135–146)

## 2015-05-07 MED ORDER — FUROSEMIDE 40 MG PO TABS: 40.0000 mg | ORAL_TABLET | Freq: Every day | ORAL | Status: DC

## 2015-05-07 NOTE — Progress Notes (Signed)
Chief Complaint  Patient presents with  . Follow-up    HOLTER MONITOR RESULTS, PT C/O SWELLING IN ANKLES    History of Present Illness: 81 yo male with history of PE/DVT on chronic coumadin therapy prior to September 2015, PVCs, NSVT, GI bleeding, COPD, prostate cancer, OSA, HLD, chronic kidney disease who is here today for cardiac follow up. He was seen as a new patient 03/25/14 for cardiac evaluation. He was in [place] September 2015 and had syncopal episode. He felt dizzy before the episode. No palpitations. He was taken to a [facility] where he was diagnosed with non-STEMI. His coumadin was held and he was placed on IV heparin. 2 days later had significant upper GI bleed that required resuscitation with blood and emergent endoscopy which showed multiple bleeding gastric ulcers. Cardiac cath was canceled due to the GI bleeding. He also had runs of non-sustained VT and was placed on amiodarone but this was changed to metoprolol before discharge. He spent 9 days in the hospital and released on 02/15/14. He had no chest pain during the admission or since then. Echo 02/08/14 from [facility] with LVEF=40-45%. I arranged a cardiac cath after his first visit but this was cancelled pending GI workup. EGD 04/03/14 with active 59mm ulcer at the duodenal bulb. Otherwise EGD was normal. Severe sigmoid diverticulosis noted on colonoscopy. Barium enema with diverticulosis but no strictures or extravasation. Of note, ASA causes hives and Ace-inh caused dry cough. He has been seen in f/u by GI and repeat EGD on 10/10/14 showed healed ulcer. He was admitted to [facility] 10/24/14 with dyspnea and found to have recurrent PE. He was started on Eliquis. Echo 10/24/14 with normal LV function, aortic valve sclerosis. He was seen here 11/07/14 and was doing well. He was seen in the the Pulmonary office the following week and his HR was felt to be in the 40s but no EKG was done. I saw him 11/22/14 and EKG showed ventricular bigeminy  with overall HR in the 80s. He was started on a beta blocker and he did well.   He was recently seen in the Pulmonary office and was bradycardic. A 48 hour monitor was placed.  48 hour monitor with 72,000 PVCs in the monitoring period. This was read by our office DOD and EP referral made with Dr. Lovena Le tomorrow. He is overall feeling well. His main complaint today is left foot discoloration and pain in both legs. Both of his legs and feet ache, mostly over the medial aspect of his calves and thighs. He has had LE edema, equal bilaterally. He was started on Lasix by telephone call one week ago and this has helped his swelling. As above, prior DVT/PE May 2016. He has been on Eliquis since then. He denies chest pain. Breathing is at baseline although he does have dyspnea with minimal exertion. No near syncope or syncope. He has no awareness of palpitations. He is now taking Lopressor 12.5 mg only once per day after instruction by our office.   Primary Care Physician: Rogelia Mire  Past Medical History  Diagnosis Date  . Emphysema   . Allergic rhinitis   . Asthma   . COPD (chronic obstructive pulmonary disease) ([place])   . Recurrent upper respiratory infection (URI)     coughing from chronic sinusitis   . Normal cardiac stress test     reports stress test was several yrs. ago /w [employer]. Dr. Rogelia Mire, WNL   . OSA (obstructive sleep  apnea) 2007    CPAP- report of settings on paper chart, seen by [facility] - seen 07/2011   . Renal stone     some passed spontaneous, & also had cystoscopy  . Neuromuscular disorder ([place])     reports shaking, somedays "can't hold spoon" - generalized  shakiness, is going to talk to MD about it at next appt.   . High cholesterol   . Shortness of breath on exertion   . H/O hiatal hernia   . Chronic renal insufficiency, stage III (moderate)   . Mixed hyperlipidemia   . PE (pulmonary embolism) 10/2011  . Bleeding gastric ulcer   . Allergy   . Sleep apnea     wears  c-pap  . Duodenum ulcer     with perforation  . Blood transfusion without reported diagnosis   . Cataract     bilateral removed  . Diabetes mellitus without complication ([place])     no meds- boarderline  . GERD (gastroesophageal reflux disease)   . Myocardial infarction ([place])   . Prostate cancer ([place]) 1992    in remission.  s/p XRT, melanoma under left eye, left temple  . Hypertension   . DVT of axillary vein, acute left 10/24/2014    Past Surgical History  Procedure Laterality Date  . Lung surgery  1986    presumed from RLL. Segmentectomy. Benign Nodule.   . Sinus endo w/fusion  sch. 09/15/2011    Procedure: ENDOSCOPIC SINUS SURGERY WITH FUSION NAVIGATION;  Surgeon: Ascencion Dike, MD;  Location: [place];  Service: ENT;  Laterality: Bilateral;  bilateral maxillary antrostomy, bilateral endoscopic ethmoidectomy, bilateral sphenoidectomy, bilateral frontal recess exploration  . Septoplasty  09/15/11  . Cataract extraction w/ intraocular lens  implant, bilateral  ~ 2000  . Ureteroscopy  1980's  . Sinus endo w/fusion  09/15/2011    Procedure: ENDOSCOPIC SINUS SURGERY WITH FUSION NAVIGATION;  Surgeon: Ascencion Dike, MD;  Location: [place];  Service: ENT;  Laterality: Bilateral;  . Maxillary antrostomy  09/15/2011    Procedure: MAXILLARY ANTROSTOMY;  Surgeon: Ascencion Dike, MD;  Location: [facility] OR;  Service: ENT;  Laterality: Bilateral;  TOTAL MAXILLARY ANTROSTOMY  . Sinus exploration  09/15/2011    Procedure: SINUS EXPLORATION;  Surgeon: Ascencion Dike, MD;  Location: [facility];  Service: ENT;  Laterality: Bilateral;  FRONTAL RECESS EXPLORATION  . Sphenoidectomy  09/15/2011    Procedure: Coralee Pesa;  Surgeon: Ascencion Dike, MD;  Location: [place];  Service: ENT;  Laterality: Bilateral;  . Septoplasty  09/15/2011    Procedure: SEPTOPLASTY;  Surgeon: Ascencion Dike, MD;  Location: [place];  Service: ENT;  Laterality: Bilateral;  . Laparotomy N/A 08/28/2013    Procedure: EXPLORATORY LAPAROTOMY;  Surgeon:  Rolm Bookbinder, MD;  Location: [place];  Service: General;  Laterality: N/A;  . Repair of perforated ulcer N/A 08/28/2013    Procedure: DUODENAL ULCER PATCH;  Surgeon: Rolm Bookbinder, MD;  Location: [place];  Service: General;  Laterality: N/A;  . Gastrostomy N/A 08/28/2013    Procedure: GASTROSTOMY;  Surgeon: Rolm Bookbinder, MD;  Location: [place];  Service: General;  Laterality: N/A;  . Esophagogastroduodenoscopy N/A 08/28/2013    Procedure: ESOPHAGOGASTRODUODENOSCOPY (EGD);  Surgeon: Gayland Curry, MD;  Location: [place];  Service: General;  Laterality: N/A;  . Melanoma excision Left     under left eye  . Esophagogastroduodenoscopy N/A 04/03/2014    Procedure: ESOPHAGOGASTRODUODENOSCOPY (EGD);  Surgeon: Inda Castle, MD;  [facility];  Service: Endoscopy;  Laterality: N/A;  . Colonoscopy N/A 04/03/2014    Procedure: COLONOSCOPY;  Surgeon: Inda Castle, MD;  Location: [facility];  Service: Endoscopy;  Laterality: N/A;  . Lobectomy Right 1986    1/3 removed     Current Outpatient Prescriptions  Medication Sig Dispense Refill  . Aclidinium Bromide (TUDORZA PRESSAIR) 400 MCG/ACT AEPB Inhale 1 puff into the lungs 2 (two) times daily. 1 each 5  . albuterol (VENTOLIN HFA) 108 (90 BASE) MCG/ACT inhaler Inhale 2 puffs into the lungs every 6 (six) hours as needed for wheezing or shortness of breath. 1 Inhaler 2  . apixaban (ELIQUIS) 5 MG TABS tablet Take 1 tablet (5 mg total) by mouth 2 (two) times daily. 60 tablet 0  . atorvastatin (LIPITOR) 10 MG tablet Take 10 mg by mouth daily at 6 PM.     . budesonide-formoterol (SYMBICORT) 160-4.5 MCG/ACT inhaler Inhale 2 puffs into the lungs 2 (two) times daily. 1 Inhaler 6  . esomeprazole (NEXIUM) 40 MG capsule Take 1 capsule (40 mg total) by mouth daily. 90 capsule 6  . fluocinonide cream (LIDEX) AB-123456789 % Apply 1 application topically daily as needed (rash). To infected area.    . fluticasone (FLONASE) 50 MCG/ACT nasal spray Place 2 sprays  into the nose at bedtime. Each nostril once a day.    . furosemide (LASIX) 40 MG tablet Take 1 tablet (40 mg total) by mouth daily. 30 tablet 6  . ketoconazole (NIZORAL) 2 % shampoo Apply 1 application topically 2 (two) times a week.     . loratadine (CLARITIN) 10 MG tablet Take 10 mg by mouth daily.    . metoprolol tartrate (LOPRESSOR) 25 MG tablet Take 0.5 tablets (12.5 mg total) by mouth daily.    . nitroGLYCERIN (NITROSTAT) 0.4 MG SL tablet PLACE 1 TABLET UNDER THE TONGUE EVERY 5 MINUTES AS NEEDED FOR CHEST PAIN 450 tablet 6  . phenol (CHLORASEPTIC) 1.4 % LIQD Use as directed 1 spray in the mouth or throat as needed for throat irritation / pain.    . tamsulosin (FLOMAX) 0.4 MG CAPS capsule Take 0.4 mg by mouth daily.     No current facility-administered medications for this visit.    Allergies  Allergen Reactions  . Mometasone Furoate Other (See Comments)    Nasonex: CAUSED RED,ITCHY EYES; "lost my eyelashes"   . Naproxen Sodium Nausea And Vomiting  . Nsaids Other (See Comments)    Stomach ulcers  . Anoro Ellipta [Umeclidinium-Vilanterol]     Double vision  . Aspirin Hives and Nausea And Vomiting     Chills, sweats  . Breo Ellipta [Fluticasone Furoate-Vilanterol] Hives  . Dexilant [Dexlansoprazole] Diarrhea    Cramps   . Omnaris [Ciclesonide] Other (See Comments)    Nose bleed   . Spiriva Handihaler [Tiotropium Bromide Monohydrate] Other (See Comments)    Hiccups     Social History   Social History  . Marital Status: Married    Spouse Name: N/A  . Number of Children: 4  . Years of Education: N/A   Occupational History  . 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     Social History Main Topics  . Smoking status: Former Smoker -- 1.00 packs/day for 60 years    Types: Cigarettes    Quit date: 08/06/2011  . Smokeless tobacco: Never Used  . Alcohol Use: 0.0 oz/week     Comment: rarely  . Drug Use: No  . Sexual Activity: No   Other Topics Concern  .  Not on file   Social History  Narrative   Traveled to [place].   Married and lives with wife.   Originally from [place].   Spends winters in [place]   Has 2 kids in [place]          Family History  Problem Relation Age of Onset  . Prostate cancer Brother   . Prostate cancer Brother   . Prostate cancer Brother   . Asthma Father   . Heart disease Mother     MI age 50  . Heart attack Mother   . Anesthesia problems Neg Hx   . Colon cancer Neg Hx   . Esophageal cancer Neg Hx   . Rectal cancer Neg Hx   . Stomach cancer Neg Hx   . Stroke Sister     Review of Systems:  As stated in the HPI and otherwise negative.   BP 110/58 mmHg  Pulse 57  Ht 5\' 6"  (1.676 m)  Wt 166 lb 6.4 oz (75.479 kg)  BMI 26.87 kg/m2  SpO2 95%  Physical Examination: General: Well developed, well nourished, NAD HEENT: OP clear, mucus membranes moist SKIN: warm, dry. No rashes. Neuro: No focal deficits Musculoskeletal: Muscle strength 5/5 all ext Psychiatric: Mood and affect normal Neck: No JVD, no carotid bruits, no thyromegaly, no lymphadenopathy. Lungs:Clear bilaterally, no wheezes, rhonci, crackles Cardiovascular: Regular rate and rhythm with ectopy. Soft systolic murmur. No gallops or rubs. Abdomen:Soft. Bowel sounds present. Non-tender.  Extremities: Trace bilateral lower extremity edema. Ecchymosis over dorsum of left foot. Pulses are non-palpable in the bilateral DP/PT.  Echo 10/24/14: - Left ventricle: The cavity size was normal. Wall thickness was normal. Systolic function was normal. The estimated ejection fraction was in the range of 55% to 60%. Doppler parameters are consistent with abnormal left ventricular relaxation (grade 1 diastolic dysfunction). The E/e&' ratio is between 8-15, suggesting indeterminate LV filling pressure. - Aortic valve: Sclerosis without stenosis. Transvalvular velocity was minimally increased. There was no regurgitation. - Mitral valve: Mildly thickened leaflets . There was  mild regurgitation. - Left atrium: Moderately dilated at 38 ml/m2. Impressions: - Compared to the prior study in 2013, there has been no change. No significant TR, therefore RVSP cannot be estimated.  EKG:  EKG is not ordered today The ekg ordered today demonstrates   Recent Labs: 10/23/2014: Pro B Natriuretic peptide (BNP) 632.0* 10/25/2014: Magnesium 1.7 10/28/2014: Hemoglobin 12.6*; Platelets 238 05/07/2015: BUN 27*; Creat 1.62*; Potassium 4.2; Sodium 136   Lipid Panel    Component Value Date/Time   TRIG 56 08/28/2013 1620     Wt Readings from Last 3 Encounters:  05/07/15 166 lb 6.4 oz (75.479 kg)  05/02/15 169 lb 6 oz (76.828 kg)  04/28/15 167 lb (75.751 kg)     Other studies Reviewed: Additional studies/ records that were reviewed today include:  Review of the above records demonstrates:    Assessment and Plan:   1. NSTEMI/CAD: Elevated troponin in [facility] 9/15 following admission for syncope but cath not performed due to GI bleeding, gastric ulcers. Recent GI workup here locally per Dr. Deatra Ina with ulcer noted duodenum and evidence of non-bleeding diverticulosis. Most recent EGD may 2016 showed the ulcer had healed. He has had no further GI bleeding. Unfortunately has been diagnosed with DVT/PE in may 2016 and is now on Eliquis. Will not plan cardiac cath at this time since he has no chest pain. Will continue statin and beta blocker.    2. HLD: continue statin.  3. DVT/PE: Per primary care. On Eliquis. Low suspicion for recurrent DVT since his leg pain is bilateral, swelling is bilateral and he has been therapeutic on Eliquis since May 2016. He may have residual chronic thrombotic disease.   4. PVCs with bigeminy/trigeminy: He is only taking Lopressor once daily. EP referral with Dr. Lovena Le tomorrow to discuss his PVCs. Question if we should titrate beta blocker and follow HR with monitor. He seems to be asymptomatic.  Potassium ok on BMET.   5. Leg pain, bilateral: He  has discoloration of both feet with areas of ecchymosis on the toes of his left foot. Pulses are not palpable in the bilateral DP/PT. Will arrange LE arterial dopplers to exclude PAD. I think it is most likely that some of his leg pain is from chronic venous insufficiency.   6. Chronic diastolic CHF: Will continue Lasix 40 mg daily.   Current medicines are reviewed at length with the patient today.  The patient does not have concerns regarding medicines.  The following changes have been made:  no change  Labs/ tests ordered today include:   Orders Placed This Encounter  Procedures  . Basic Metabolic Panel (BMET)    Disposition:   FU with me in 4 weeks.   Signed, Lauree Chandler, MD 05/08/2015 7:46 AM    [facility] [place], [place],   [address] Phone: [phone]; Fax: [phone]

## 2015-05-07 NOTE — Patient Instructions (Addendum)
Medication Instructions:  Your physician recommends that you continue on your current medications as directed. Please refer to the Current Medication list given to you today.    Labwork: Lab work to be done today--BMP  Testing/Procedures: Your physician has requested that you have a lower or upper extremity arterial duplex. This test is an ultrasound of the arteries in the legs or arms. It looks at arterial blood flow in the legs and arms. Allow one hour for Lower and Upper Arterial scans. There are no restrictions or special instructions   Follow-Up:  Keep scheduled follow up with Dr. Angelena Form on January 12 Any Other Special Instructions Will Be Listed Below (If Applicable).     If you need a refill on your cardiac medications before your next appointment, please call your pharmacy.

## 2015-05-08 ENCOUNTER — Ambulatory Visit (INDEPENDENT_AMBULATORY_CARE_PROVIDER_SITE_OTHER): Payer: Medicare Other | Admitting: Internal Medicine

## 2015-05-08 ENCOUNTER — Encounter: Payer: Self-pay | Admitting: Internal Medicine

## 2015-05-08 VITALS — BP 120/58 | HR 37 | Ht 68.0 in | Wt 166.0 lb

## 2015-05-08 DIAGNOSIS — I10 Essential (primary) hypertension: Secondary | ICD-10-CM

## 2015-05-08 DIAGNOSIS — R001 Bradycardia, unspecified: Secondary | ICD-10-CM

## 2015-05-08 DIAGNOSIS — I493 Ventricular premature depolarization: Secondary | ICD-10-CM | POA: Diagnosis not present

## 2015-05-08 DIAGNOSIS — I5032 Chronic diastolic (congestive) heart failure: Secondary | ICD-10-CM | POA: Diagnosis not present

## 2015-05-08 NOTE — Progress Notes (Signed)
HPI [name] is referred today for evaluation of dense PVC's. He is a pleasant 81 yo man with known CAD, PVC's and COPD. He sustained a NSTEMI several weeks ago. He has also had GI bleeding in the past with hemoptysis and hematemesis. The patient was noted on routine monitoring to have very dense ventricular ectopy on a 48 hour holter with over 30000 PVC's in a 24 hour period. Review of the electrograms reveals multiple PVC morphologies.He has not had syncope. No chest pain. No sob except when he walks fast up an incline. Allergies  Allergen Reactions  . Mometasone Furoate Other (See Comments)    Nasonex: CAUSED RED,ITCHY EYES; "lost my eyelashes"   . Naproxen Sodium Nausea And Vomiting  . Nsaids Other (See Comments)    Stomach ulcers  . Anoro Ellipta [Umeclidinium-Vilanterol]     Double vision  . Aspirin Hives and Nausea And Vomiting     Chills, sweats  . Breo Ellipta [Fluticasone Furoate-Vilanterol] Hives  . Dexilant [Dexlansoprazole] Diarrhea    Cramps   . Omnaris [Ciclesonide] Other (See Comments)    Nose bleed   . Spiriva Handihaler [Tiotropium Bromide Monohydrate] Other (See Comments)    Hiccups      Current Outpatient Prescriptions  Medication Sig Dispense Refill  . Aclidinium Bromide (TUDORZA PRESSAIR) 400 MCG/ACT AEPB Inhale 1 puff into the lungs 2 (two) times daily. 1 each 5  . albuterol (VENTOLIN HFA) 108 (90 BASE) MCG/ACT inhaler Inhale 2 puffs into the lungs every 6 (six) hours as needed for wheezing or shortness of breath. 1 Inhaler 2  . apixaban (ELIQUIS) 5 MG TABS tablet Take 1 tablet (5 mg total) by mouth 2 (two) times daily. 60 tablet 0  . atorvastatin (LIPITOR) 10 MG tablet Take 10 mg by mouth daily at 6 PM.     . budesonide-formoterol (SYMBICORT) 160-4.5 MCG/ACT inhaler Inhale 2 puffs into the lungs 2 (two) times daily. 1 Inhaler 6  . esomeprazole (NEXIUM) 40 MG capsule Take 1 capsule (40 mg total) by mouth daily. 90 capsule 6  . fluocinonide cream  (LIDEX) [id number] % Apply 1 application topically daily as needed (rash). To infected area.    . fluticasone (FLONASE) 50 MCG/ACT nasal spray Place 2 sprays into the nose at bedtime. Each nostril once a day.    . furosemide (LASIX) 40 MG tablet Take 1 tablet (40 mg total) by mouth daily. 30 tablet 6  . ketoconazole (NIZORAL) 2 % shampoo Apply 1 application topically 2 (two) times a week.     . loratadine (CLARITIN) 10 MG tablet Take 10 mg by mouth daily.    . metoprolol tartrate (LOPRESSOR) 25 MG tablet Take 0.5 tablets (12.5 mg total) by mouth daily.    . nitroGLYCERIN (NITROSTAT) 0.4 MG SL tablet PLACE 1 TABLET UNDER THE TONGUE EVERY 5 MINUTES AS NEEDED FOR CHEST PAIN 450 tablet 6  . phenol (CHLORASEPTIC) 1.4 % LIQD Use as directed 1 spray in the mouth or throat as needed for throat irritation / pain.    . tamsulosin (FLOMAX) 0.4 MG CAPS capsule Take 0.4 mg by mouth daily.     No current facility-administered medications for this visit.     Past Medical History  Diagnosis Date  . Emphysema   . Allergic rhinitis   . Asthma   . COPD (chronic obstructive pulmonary disease) ([place])   . Recurrent upper respiratory infection (URI)     coughing from chronic sinusitis   . Normal cardiac  stress test     reports stress test was several yrs. ago /w [employer]. Dr. Rogelia Mire, WNL   . OSA (obstructive sleep apnea) 2007    CPAP- report of settings on paper chart, seen by [facility] - seen 07/2011   . Renal stone     some passed spontaneous, & also had cystoscopy  . Neuromuscular disorder ([place])     reports shaking, somedays "can't hold spoon" - generalized  shakiness, is going to talk to MD about it at next appt.   . High cholesterol   . Shortness of breath on exertion   . H/O hiatal hernia   . Chronic renal insufficiency, stage III (moderate)   . Mixed hyperlipidemia   . PE (pulmonary embolism) 10/2011  . Bleeding gastric ulcer   . Allergy   . Sleep apnea     wears c-pap  . Duodenum ulcer     with  perforation  . Blood transfusion without reported diagnosis   . Cataract     bilateral removed  . Diabetes mellitus without complication ([place])     no meds- boarderline  . GERD (gastroesophageal reflux disease)   . Myocardial infarction ([place])   . Prostate cancer ([place]) 1992    in remission.  s/p XRT, melanoma under left eye, left temple  . Hypertension   . DVT of axillary vein, acute left 10/24/2014    ROS:   All systems reviewed and negative except as noted in the HPI.   Past Surgical History  Procedure Laterality Date  . Lung surgery  1986    presumed from RLL. Segmentectomy. Benign Nodule.   . Sinus endo w/fusion  sch. 09/15/2011    Procedure: ENDOSCOPIC SINUS SURGERY WITH FUSION NAVIGATION;  Surgeon: Ascencion Dike, MD;  Location: [place];  Service: ENT;  Laterality: Bilateral;  bilateral maxillary antrostomy, bilateral endoscopic ethmoidectomy, bilateral sphenoidectomy, bilateral frontal recess exploration  . Septoplasty  09/15/11  . Cataract extraction w/ intraocular lens  implant, bilateral  ~ 2000  . Ureteroscopy  1980's  . Sinus endo w/fusion  09/15/2011    Procedure: ENDOSCOPIC SINUS SURGERY WITH FUSION NAVIGATION;  Surgeon: Ascencion Dike, MD;  Location: [place];  Service: ENT;  Laterality: Bilateral;  . Maxillary antrostomy  09/15/2011    Procedure: MAXILLARY ANTROSTOMY;  Surgeon: Ascencion Dike, MD;  Location: [facility] OR;  Service: ENT;  Laterality: Bilateral;  TOTAL MAXILLARY ANTROSTOMY  . Sinus exploration  09/15/2011    Procedure: SINUS EXPLORATION;  Surgeon: Ascencion Dike, MD;  Location[facility] OR;  Service: ENT;  Laterality: Bilateral;  FRONTAL RECESS EXPLORATION  . Sphenoidectomy  09/15/2011    Procedure: Coralee Pesa;  Surgeon: Ascencion Dike, MD;  Location: [place];  Service: ENT;  Laterality: Bilateral;  . Septoplasty  09/15/2011    Procedure: SEPTOPLASTY;  Surgeon: Ascencion Dike, MD;  Location: [place];  Service: ENT;  Laterality: Bilateral;  . Laparotomy N/A 08/28/2013     Procedure: EXPLORATORY LAPAROTOMY;  Surgeon: Rolm Bookbinder, MD;  Location: [place];  Service: General;  Laterality: N/A;  . Repair of perforated ulcer N/A 08/28/2013    Procedure: DUODENAL ULCER PATCH;  Surgeon: Rolm Bookbinder, MD;  Location: [place];  Service: General;  Laterality: N/A;  . Gastrostomy N/A 08/28/2013    Procedure: GASTROSTOMY;  Surgeon: Rolm Bookbinder, MD;  Location: [place];  Service: General;  Laterality: N/A;  . Esophagogastroduodenoscopy N/A 08/28/2013    Procedure: ESOPHAGOGASTRODUODENOSCOPY (EGD);  Surgeon: Gayland Curry, MD;  Location: [facility] OR;  Service: General;  Laterality: N/A;  . Melanoma excision Left     under left eye  . Esophagogastroduodenoscopy N/A 04/03/2014    Procedure: ESOPHAGOGASTRODUODENOSCOPY (EGD);  Surgeon: Inda Castle, MD;  [facility];  Service: Endoscopy;  Laterality: N/A;  . Colonoscopy N/A 04/03/2014    Procedure: COLONOSCOPY;  Surgeon: Inda Castle, MD;  Location: [facility];  Service: Endoscopy;  Laterality: N/A;  . Lobectomy Right 1986    1/3 removed      Family History  Problem Relation Age of Onset  . Prostate cancer Brother   . Prostate cancer Brother   . Prostate cancer Brother   . Asthma Father   . Heart disease Mother     MI age 71  . Heart attack Mother   . Anesthesia problems Neg Hx   . Colon cancer Neg Hx   . Esophageal cancer Neg Hx   . Rectal cancer Neg Hx   . Stomach cancer Neg Hx   . Stroke Sister      Social History   Social History  . Marital Status: Married    Spouse Name: N/A  . Number of Children: 4  . Years of Education: N/A   Occupational History  . 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     Social History Main Topics  . Smoking status: Former Smoker -- 1.00 packs/day for 60 years    Types: Cigarettes    Quit date: 08/06/2011  . Smokeless tobacco: Never Used  . Alcohol Use: 0.0 oz/week    0 Standard drinks or equivalent per week     Comment: rarely  . Drug Use: No  . Sexual Activity: No     Other Topics Concern  . Not on file   Social History Narrative   Traveled to [place].   Married and lives with wife.   Originally from [place].   Spends winters in [place]   Has 2 kids in [place]           BP 120/58 mmHg  Pulse 37  Ht 5\' 8"  (1.727 m)  Wt 166 lb (75.297 kg)  BMI 25.25 kg/m2  SpO2 97%  Physical Exam:  Well appearing NAD HEENT: Unremarkable Neck:  No JVD, no thyromegally Lymphatics:  No adenopathy Back:  No CVA tenderness Lungs:  Clear with no wheezes HEART:  IRegular rate rhythm, no murmurs, no rubs, no clicks Abd:  soft, positive bowel sounds, no organomegally, no rebound, no guarding Ext:  2 plus pulses, no edema, no cyanosis, no clubbing Skin:  No rashes no nodules Neuro:  CN II through XII intact, motor grossly intact  Holter monitor - reviewed.    Assess/Plan:

## 2015-05-08 NOTE — Assessment & Plan Note (Signed)
He is asymptomatic but he has a large amount of ventricular ectopy. I discussed my concerns that the patient might end up in worsening heart failure from his ventricular ectopy. With his h/o CAD, there are a limited number of medications that might be used to treat his PVC's. I have asked that for now he undergo watchful waiting. If his symptoms worsen then I would emperically consider adding either amio or mexitil. In the interim, we will schedule a repeat echo in 6 months.

## 2015-05-08 NOTE — Telephone Encounter (Signed)
Patient given lab results BMET, with Dr. Reatha Armour comments

## 2015-05-08 NOTE — Assessment & Plan Note (Signed)
His blood pressure is well controlled. No change in his meds.

## 2015-05-08 NOTE — Assessment & Plan Note (Signed)
Mostly his low pulse is related to ventricular ectopy. Will follow.

## 2015-05-08 NOTE — Patient Instructions (Signed)
Medication Instructions:  Your physician recommends that you continue on your current medications as directed. Please refer to the Current Medication list given to you today.   Labwork: None ordered   Testing/Procedures: Your physician has requested that you have an echocardiogram. Echocardiography is a painless test that uses sound waves to create images of your heart. It provides your doctor with information about the size and shape of your heart and how well your heart's chambers and valves are working. This procedure takes approximately one hour. There are no restrictions for this procedure.---6 months    Follow-Up: Your physician wants you to follow-up in: 6 months with Dr Lovena Le.  A week after the echo is done     If you need a refill on your cardiac medications before your next appointment, please call your pharmacy.

## 2015-05-09 ENCOUNTER — Other Ambulatory Visit: Payer: Self-pay | Admitting: Cardiovascular Disease

## 2015-05-09 ENCOUNTER — Ambulatory Visit (HOSPITAL_COMMUNITY)
Admission: RE | Admit: 2015-05-09 | Discharge: 2015-05-09 | Disposition: A | Discharge status: Home / Self Care | Payer: Medicare Other | Source: Ambulatory Visit | Attending: Cardiology | Admitting: Cardiology

## 2015-05-09 DIAGNOSIS — E782 Mixed hyperlipidemia: Secondary | ICD-10-CM | POA: Insufficient documentation

## 2015-05-09 DIAGNOSIS — E119 Type 2 diabetes mellitus without complications: Secondary | ICD-10-CM | POA: Diagnosis not present

## 2015-05-09 DIAGNOSIS — R0989 Other specified symptoms and signs involving the circulatory and respiratory systems: Secondary | ICD-10-CM | POA: Diagnosis not present

## 2015-05-09 DIAGNOSIS — M79605 Pain in left leg: Secondary | ICD-10-CM | POA: Insufficient documentation

## 2015-05-09 DIAGNOSIS — I129 Hypertensive chronic kidney disease with stage 1 through stage 4 chronic kidney disease, or unspecified chronic kidney disease: Secondary | ICD-10-CM | POA: Diagnosis not present

## 2015-05-09 DIAGNOSIS — N183 Chronic kidney disease, stage 3 (moderate): Secondary | ICD-10-CM | POA: Diagnosis not present

## 2015-05-16 DIAGNOSIS — R21 Rash and other nonspecific skin eruption: Secondary | ICD-10-CM | POA: Diagnosis not present

## 2015-05-16 DIAGNOSIS — J339 Nasal polyp, unspecified: Secondary | ICD-10-CM | POA: Diagnosis not present

## 2015-05-16 DIAGNOSIS — J3089 Other allergic rhinitis: Secondary | ICD-10-CM | POA: Diagnosis not present

## 2015-05-16 DIAGNOSIS — J4530 Mild persistent asthma, uncomplicated: Secondary | ICD-10-CM | POA: Diagnosis not present

## 2015-06-12 ENCOUNTER — Encounter: Payer: Self-pay | Admitting: Cardiovascular Disease

## 2015-06-12 ENCOUNTER — Ambulatory Visit (INDEPENDENT_AMBULATORY_CARE_PROVIDER_SITE_OTHER): Payer: Medicare Other | Admitting: Cardiovascular Disease

## 2015-06-12 VITALS — BP 102/58 | HR 69 | Ht 68.0 in | Wt 168.4 lb

## 2015-06-12 DIAGNOSIS — I10 Essential (primary) hypertension: Secondary | ICD-10-CM | POA: Diagnosis not present

## 2015-06-12 DIAGNOSIS — I2510 Atherosclerotic heart disease of native coronary artery without angina pectoris: Secondary | ICD-10-CM

## 2015-06-12 DIAGNOSIS — E785 Hyperlipidemia, unspecified: Secondary | ICD-10-CM

## 2015-06-12 DIAGNOSIS — I493 Ventricular premature depolarization: Secondary | ICD-10-CM | POA: Diagnosis not present

## 2015-06-12 DIAGNOSIS — I5032 Chronic diastolic (congestive) heart failure: Secondary | ICD-10-CM

## 2015-06-12 MED ORDER — METOPROLOL TARTRATE 25 MG PO TABS: 12.5000 mg | ORAL_TABLET | Freq: Two times a day (BID) | ORAL | Status: DC

## 2015-06-12 MED ORDER — FUROSEMIDE 40 MG PO TABS: 40.0000 mg | ORAL_TABLET | Freq: Every day | ORAL | Status: DC

## 2015-06-12 NOTE — Patient Instructions (Signed)
Medication Instructions:  Your physician has recommended you make the following change in your medication:  Increase metoprolol tartrate to 12.5 mg by mouth twice daily. Resume furosemide 40 mg by mouth daily.    Labwork: none  Testing/Procedures: Your physician has requested that you have an echocardiogram. Echocardiography is a painless test that uses sound waves to create images of your heart. It provides your doctor with information about the size and shape of your heart and how well your heart's chambers and valves are working. This procedure takes approximately one hour. There are no restrictions for this procedure. Already scheduled for May 30,2017    Follow-Up: Your physician wants you to follow-up in: 6 months.  You will receive a reminder letter in the mail two months in advance. If you don't receive a letter, please call our office to schedule the follow-up appointment.   Any Other Special Instructions Will Be Listed Below (If Applicable)..     If you need a refill on your cardiac medications before your next appointment, please call your pharmacy.

## 2015-06-12 NOTE — Progress Notes (Signed)
Chief Complaint  Patient presents with  . Follow-up    pt c/o swelling in ankles  . Coronary Artery Disease    History of Present Illness: 82 yo male with history of PE/DVT on chronic coumadin therapy prior to September 2015, PVCs, NSVT, GI bleeding, COPD, prostate cancer, OSA, HLD, chronic kidney disease who is here today for cardiac follow up. He was seen as a new patient 03/25/14 for cardiac evaluation. He was in [place] September 2015 and had syncopal episode. He felt dizzy before the episode. No palpitations. He was taken to a [facility] where he was diagnosed with non-STEMI. His coumadin was held and he was placed on IV heparin. 2 days later had significant upper GI bleed that required resuscitation with blood and emergent endoscopy which showed multiple bleeding gastric ulcers. Cardiac cath was canceled due to the GI bleeding. He also had runs of non-sustained VT and was placed on amiodarone but this was changed to metoprolol before discharge. He spent 9 days in the hospital and released on 02/15/14. He had no chest pain during the admission or since then. Echo 02/08/14 from [facility] with LVEF=40-45%. I arranged a cardiac cath after his first visit but this was cancelled pending GI workup. EGD 04/03/14 with active 22mm ulcer at the duodenal bulb. Otherwise EGD was normal. Severe sigmoid diverticulosis noted on colonoscopy. Barium enema with diverticulosis but no strictures or extravasation. Of note, ASA causes hives and Ace-inh caused dry cough. He has been seen in f/u by GI and repeat EGD on 10/10/14 showed healed ulcer. He was admitted to [facility] 10/24/14 with dyspnea and found to have recurrent PE. He was started on Eliquis. Echo 10/24/14 with normal LV function, aortic valve sclerosis. He was seen here 11/07/14 and was doing well. He was seen in the the Pulmonary office the following week and his HR was felt to be in the 40s but no EKG was done. I saw him 11/22/14 and EKG showed ventricular  bigeminy with overall HR in the 80s. He was started on a beta blocker and he did well. He was recently seen in the Pulmonary office and was bradycardic. A 48 hour monitor was placed.  48 hour monitor with 72,000 PVCs in the monitoring period. This was read by our office DOD and EP referral made with Dr. Lovena Le who saw him 05/07/16. Plan was to watch for now and repeat echo in 6 months. He was started on Lasix for LE edema prior to his last visit with me.    He is here today for follow up. He rarely notices any palpitations. No chest pain. No change in breathing. He has had LE edema, equal bilaterally. No near syncope or syncope.   Primary Care Physician: Rogelia Mire  Past Medical History  Diagnosis Date  . Emphysema   . Allergic rhinitis   . Asthma   . COPD (chronic obstructive pulmonary disease) ([place])   . Recurrent upper respiratory infection (URI)     coughing from chronic sinusitis   . Normal cardiac stress test     reports stress test was several yrs. ago /w [employer]. Dr. Rogelia Mire, WNL   . OSA (obstructive sleep apnea) 2007    CPAP- report of settings on paper chart, seen by [facility] - seen 07/2011   . Renal stone     some passed spontaneous, & also had cystoscopy  . Neuromuscular disorder ([place])     reports shaking, somedays "can't hold spoon" - generalized  shakiness, is going to talk to MD about it at next appt.   . High cholesterol   . Shortness of breath on exertion   . H/O hiatal hernia   . Chronic renal insufficiency, stage III (moderate)   . Mixed hyperlipidemia   . PE (pulmonary embolism) 10/2011  . Bleeding gastric ulcer   . Allergy   . Sleep apnea     wears c-pap  . Duodenum ulcer     with perforation  . Blood transfusion without reported diagnosis   . Cataract     bilateral removed  . Diabetes mellitus without complication ([place])     no meds- boarderline  . GERD (gastroesophageal reflux disease)   . Myocardial infarction ([place])   . Prostate cancer ([place]) 1992     in remission.  s/p XRT, melanoma under left eye, left temple  . Hypertension   . DVT of axillary vein, acute left 10/24/2014    Past Surgical History  Procedure Laterality Date  . Lung surgery  1986    presumed from RLL. Segmentectomy. Benign Nodule.   . Sinus endo w/fusion  sch. 09/15/2011    Procedure: ENDOSCOPIC SINUS SURGERY WITH FUSION NAVIGATION;  Surgeon: Ascencion Dike, MD;  Location: [place];  Service: ENT;  Laterality: Bilateral;  bilateral maxillary antrostomy, bilateral endoscopic ethmoidectomy, bilateral sphenoidectomy, bilateral frontal recess exploration  . Septoplasty  09/15/11  . Cataract extraction w/ intraocular lens  implant, bilateral  ~ 2000  . Ureteroscopy  1980's  . Sinus endo w/fusion  09/15/2011    Procedure: ENDOSCOPIC SINUS SURGERY WITH FUSION NAVIGATION;  Surgeon: Ascencion Dike, MD;  Location: [place];  Service: ENT;  Laterality: Bilateral;  . Maxillary antrostomy  09/15/2011    Procedure: MAXILLARY ANTROSTOMY;  Surgeon: Ascencion Dike, MD;  Location: [facility] OR;  Service: ENT;  Laterality: Bilateral;  TOTAL MAXILLARY ANTROSTOMY  . Sinus exploration  09/15/2011    Procedure: SINUS EXPLORATION;  Surgeon: Ascencion Dike, MD;  Location: [facility] OR;  Service: ENT;  Laterality: Bilateral;  FRONTAL RECESS EXPLORATION  . Sphenoidectomy  09/15/2011    Procedure: Coralee Pesa;  Surgeon: Ascencion Dike, MD;  Location: [place];  Service: ENT;  Laterality: Bilateral;  . Septoplasty  09/15/2011    Procedure: SEPTOPLASTY;  Surgeon: Ascencion Dike, MD;  Location: [place];  Service: ENT;  Laterality: Bilateral;  . Laparotomy N/A 08/28/2013    Procedure: EXPLORATORY LAPAROTOMY;  Surgeon: Rolm Bookbinder, MD;  Location: [place];  Service: General;  Laterality: N/A;  . Repair of perforated ulcer N/A 08/28/2013    Procedure: DUODENAL ULCER PATCH;  Surgeon: Rolm Bookbinder, MD;  Location: [place];  Service: General;  Laterality: N/A;  . Gastrostomy N/A 08/28/2013    Procedure: GASTROSTOMY;  Surgeon: Rolm Bookbinder, MD;  Location: [place];  Service: General;  Laterality: N/A;  . Esophagogastroduodenoscopy N/A 08/28/2013    Procedure: ESOPHAGOGASTRODUODENOSCOPY (EGD);  Surgeon: Gayland Curry, MD;  Location: [place];  Service: General;  Laterality: N/A;  . Melanoma excision Left     under left eye  . Esophagogastroduodenoscopy N/A 04/03/2014    Procedure: ESOPHAGOGASTRODUODENOSCOPY (EGD);  Surgeon: Inda Castle, MD;  Location: [facility];  Service: Endoscopy;  Laterality: N/A;  . Colonoscopy N/A 04/03/2014    Procedure: COLONOSCOPY;  Surgeon: Inda Castle, MD;  Location: [facility];  Service: Endoscopy;  Laterality: N/A;  . Lobectomy Right 1986    1/3 removed     Current Outpatient Prescriptions  Medication  Sig Dispense Refill  . Aclidinium Bromide (TUDORZA PRESSAIR) 400 MCG/ACT AEPB Inhale 1 puff into the lungs 2 (two) times daily. 1 each 5  . albuterol (VENTOLIN HFA) 108 (90 BASE) MCG/ACT inhaler Inhale 2 puffs into the lungs every 6 (six) hours as needed for wheezing or shortness of breath. 1 Inhaler 2  . apixaban (ELIQUIS) 5 MG TABS tablet Take 1 tablet (5 mg total) by mouth 2 (two) times daily. 60 tablet 0  . atorvastatin (LIPITOR) 10 MG tablet Take 10 mg by mouth daily at 6 PM.     . budesonide-formoterol (SYMBICORT) 160-4.5 MCG/ACT inhaler Inhale 2 puffs into the lungs 2 (two) times daily. 1 Inhaler 6  . esomeprazole (NEXIUM) 40 MG capsule Take 1 capsule (40 mg total) by mouth daily. 90 capsule 6  . fluocinonide cream (LIDEX) [id number] % Apply 1 application topically daily as needed (rash). To infected area.    . fluticasone (FLONASE) 50 MCG/ACT nasal spray Place 2 sprays into the nose at bedtime. Each nostril once a day.    . furosemide (LASIX) 40 MG tablet Take 1 tablet (40 mg total) by mouth daily. 90 tablet 3  . ketoconazole (NIZORAL) 2 % shampoo Apply 1 application topically 2 (two) times a week.     . loratadine (CLARITIN) 10 MG tablet Take 10 mg by mouth daily.    . metoprolol  tartrate (LOPRESSOR) 25 MG tablet Take 0.5 tablets (12.5 mg total) by mouth 2 (two) times daily. 90 tablet 3  . nitroGLYCERIN (NITROSTAT) 0.4 MG SL tablet PLACE 1 TABLET UNDER THE TONGUE EVERY 5 MINUTES AS NEEDED FOR CHEST PAIN 450 tablet 6  . phenol (CHLORASEPTIC) 1.4 % LIQD Use as directed 1 spray in the mouth or throat as needed for throat irritation / pain.    . tamsulosin (FLOMAX) 0.4 MG CAPS capsule Take 0.4 mg by mouth daily.     No current facility-administered medications for this visit.    Allergies  Allergen Reactions  . Mometasone Furoate Other (See Comments)    Nasonex: CAUSED RED,ITCHY EYES; "lost my eyelashes"   . Naproxen Sodium Nausea And Vomiting  . Nsaids Other (See Comments)    Stomach ulcers  . Anoro Ellipta [Umeclidinium-Vilanterol] Other (See Comments)    Double vision  . Aspirin Hives and Nausea And Vomiting     Chills, sweats  . Breo Ellipta [Fluticasone Furoate-Vilanterol] Hives  . Dexilant [Dexlansoprazole] Diarrhea    Cramps   . Omnaris [Ciclesonide] Other (See Comments)    Nose bleed   . Spiriva Handihaler [Tiotropium Bromide Monohydrate] Other (See Comments)    Hiccups     Social History   Social History  . Marital Status: Married    Spouse Name: N/A  . Number of Children: 4  . Years of Education: N/A   Occupational History  . 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     Social History Main Topics  . Smoking status: Former Smoker -- 1.00 packs/day for 60 years    Types: Cigarettes    Quit date: 08/06/2011  . Smokeless tobacco: Never Used  . Alcohol Use: 0.0 oz/week    0 Standard drinks or equivalent per week     Comment: rarely  . Drug Use: No  . Sexual Activity: No   Other Topics Concern  . Not on file   Social History Narrative   Traveled to [place].   Married and lives with wife.   Originally from [place].   Spends winters in [place]   Has 2  kids in [place]          Family History  Problem Relation Age of Onset  . Prostate cancer Brother     . Prostate cancer Brother   . Prostate cancer Brother   . Asthma Father   . Heart disease Mother     MI age 60  . Heart attack Mother   . Anesthesia problems Neg Hx   . Colon cancer Neg Hx   . Esophageal cancer Neg Hx   . Rectal cancer Neg Hx   . Stomach cancer Neg Hx   . Stroke Sister     Review of Systems:  As stated in the HPI and otherwise negative.   BP 102/58 mmHg  Pulse 69  Ht 5\' 8"  (1.727 m)  Wt 168 lb 6.4 oz (76.386 kg)  BMI 25.61 kg/m2  SpO2 97%  Physical Examination: General: Well developed, well nourished, NAD HEENT: OP clear, mucus membranes moist SKIN: warm, dry. No rashes. Neuro: No focal deficits Musculoskeletal: Muscle strength 5/5 all ext Psychiatric: Mood and affect normal Neck: No JVD, no carotid bruits, no thyromegaly, no lymphadenopathy. Lungs:Clear bilaterally, no wheezes, rhonci, crackles Cardiovascular: Regular rate and rhythm with ectopy. Soft systolic murmur. No gallops or rubs. Abdomen:Soft. Bowel sounds present. Non-tender.  Extremities: Trace bilateral lower extremity edema. Ecchymosis over dorsum of left foot. Pulses are non-palpable in the bilateral DP/PT.  Echo 10/24/14: - Left ventricle: The cavity size was normal. Wall thickness was normal. Systolic function was normal. The estimated ejection fraction was in the range of 55% to 60%. Doppler parameters are consistent with abnormal left ventricular relaxation (grade 1 diastolic dysfunction). The E/e&' ratio is between 8-15, suggesting indeterminate LV filling pressure. - Aortic valve: Sclerosis without stenosis. Transvalvular velocity was minimally increased. There was no regurgitation. - Mitral valve: Mildly thickened leaflets . There was mild regurgitation. - Left atrium: Moderately dilated at 38 ml/m2. Impressions: - Compared to the prior study in 2013, there has been no change. No significant TR, therefore RVSP cannot be estimated.  EKG:  EKG is not ordered  today The ekg ordered today demonstrates   Recent Labs: 10/23/2014: Pro B Natriuretic peptide (BNP) 632.0* 10/25/2014: Magnesium 1.7 10/28/2014: Hemoglobin 12.6*; Platelets 238 05/07/2015: BUN 27*; Creat 1.62*; Potassium 4.2; Sodium 136   Lipid Panel    Component Value Date/Time   TRIG 56 08/28/2013 1620     Wt Readings from Last 3 Encounters:  06/12/15 168 lb 6.4 oz (76.386 kg)  05/08/15 166 lb (75.297 kg)  05/07/15 166 lb 6.4 oz (75.479 kg)     Other studies Reviewed: Additional studies/ records that were reviewed today include:  Review of the above records demonstrates:    Assessment and Plan:   1. CAD: Elevated troponin in [facility] 9/15 following admission for syncope but cath not performed due to GI bleeding, gastric ulcers. Recent GI workup here locally per Dr. Deatra Ina with ulcer noted duodenum and evidence of non-bleeding diverticulosis. Most recent EGD may 2016 showed the ulcer had healed. He has had no further GI bleeding. Unfortunately has been diagnosed with DVT/PE in may 2016 and is now on Eliquis. Will not plan cardiac cath at this time since he has no chest pain. Will continue statin and beta blocker.  Will increase Lopressor to 12.5 mg po BID.   2. HLD: continue statin.   3. DVT/PE: Per primary care. On Eliquis.   4. PVCs with bigeminy/trigeminy: will increase Lopressor to 12.5 mg po BID. He is being followed in  the EP clinic by Dr. Lovena Le. He seems to be asymptomatic. Repeat echo in 4 months.   5. Bilateral lower extremity edema: Continue Lasix. Elevate legs when sitting. Normal ABI December 2016.  I think it is most likely that some of his leg pain is from chronic venous insufficiency.   6. Chronic diastolic CHF: Will continue Lasix 40 mg daily.   7. HTN: BP controlled.   Current medicines are reviewed at length with the patient today.  The patient does not have concerns regarding medicines.  The following changes have been made:  no change  Labs/ tests ordered  today include:   No orders of the defined types were placed in this encounter.    Disposition:   FU with me in 6 weeks.   Signed, Lauree Chandler, MD 06/12/2015 1:02 PM    [facility] [address], [place], [place]  [address] Phone: [phone]; Fax: [phone]

## 2015-06-17 ENCOUNTER — Telehealth: Payer: Self-pay | Admitting: Adult Health

## 2015-06-17 NOTE — Telephone Encounter (Signed)
Spoke with the pt  He states unable to get Eliquis  He thinks that med is not covered any longer  [facility] in [place] and requested that they fax over the PA form  Will await fax

## 2015-06-18 NOTE — Telephone Encounter (Signed)
Pt states that he needs his Eliquis ASAP - aware that Rx requires PA. Aware that we have not received any paperwork regarding this and that I will call his pharmacy to find out what is needed.  ---- Called pharmacy - Eliquis does require PA [facility] [id number] Member ID# [id number] ----- Called [facility], spoke with Deliah Goody 5mg  tabs approved through insurance until 12/16/2015 under medicare Part D.  PA will have to be completed again in July 2017.  ----- Called patient back, aware that Rx is approved and he may call the pharmacy to refill this now.  Nothing further needed.

## 2015-06-18 NOTE — Telephone Encounter (Signed)
lmtcb x2 for pt. 

## 2015-06-18 NOTE — Telephone Encounter (Signed)
[phone]  Pt calling back

## 2015-07-04 IMAGING — CR DG CHEST 1V PORT
1 series · 1 of 1 positions shown · non-contrast
Comparison: 08/28/2013

CLINICAL DATA: ETT placement.  Shortness of breath.

EXAM:
PORTABLE CHEST - 1 VIEW

[AP]
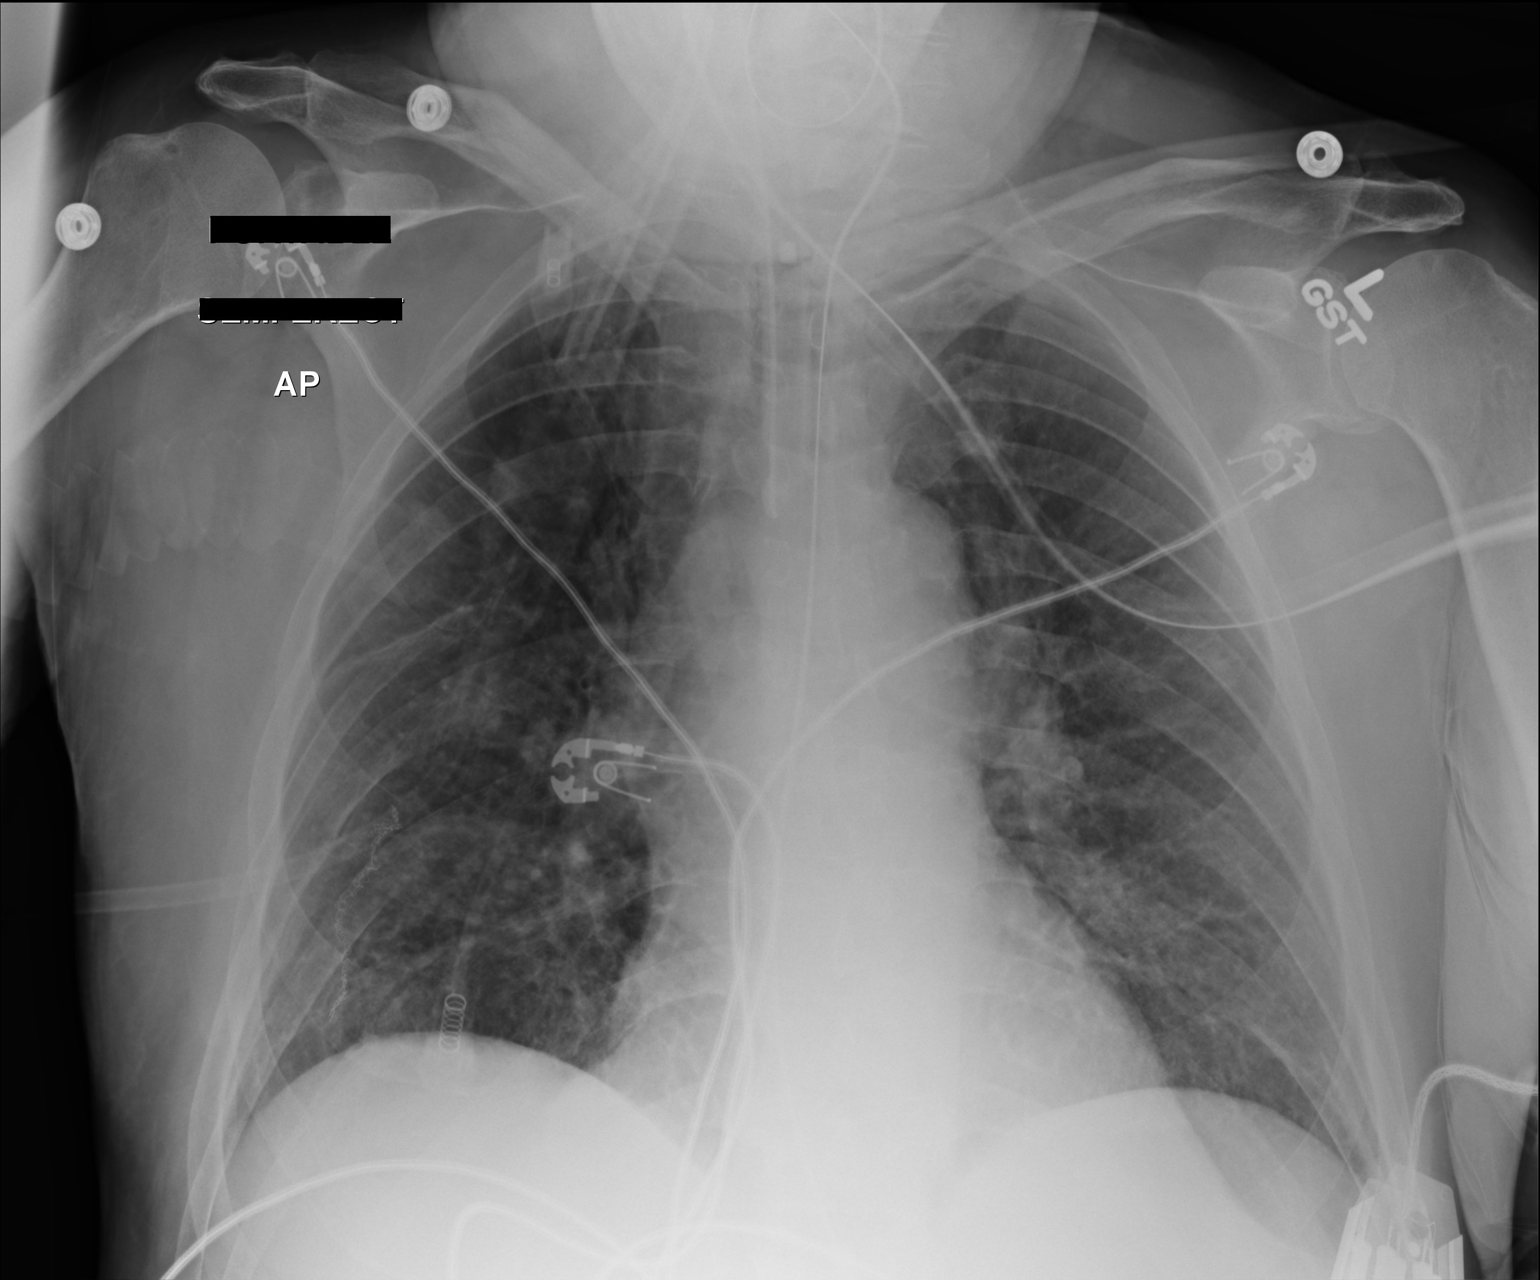

[1 of 1 positions shown; findings below may reference images not displayed]

FINDINGS: Endotracheal tube ends in the mid thoracic trachea. An enteric tube
crosses the diaphragm.

Unchanged heart size and mediastinal contours. Chronic interstitial
coarsening without edema or consolidation. Previous lung resection
on the right. No effusion or pneumothorax.
IMPRESSION: 1. Endotracheal and orogastric tubes remain in good position.
2. Emphysema with stable pulmonary inflation.

## 2015-07-09 IMAGING — RF DG UGI W/ GASTROGRAFIN
14 of 15 series · 14 of 15 positions shown · IV contrast (omnipaque)
Comparison: CT scan 09/02/2013

FLUOROSCOPY TIME:  1 min and 55 seconds

CLINICAL DATA: History of perforated duodenum ulcer. Evaluate for
possible leak.

EXAM:
WATER SOLUBLE UPPER GI SERIES
TECHNIQUE: Single-column upper GI series was performed using water soluble
contrast.
CONTRAST:  120mL OMNIPAQUE IOHEXOL 300 MG/ML  SOLN

[Series 1: run · 1 of 1 slices shown (1 of 14)]
[im 1/1]
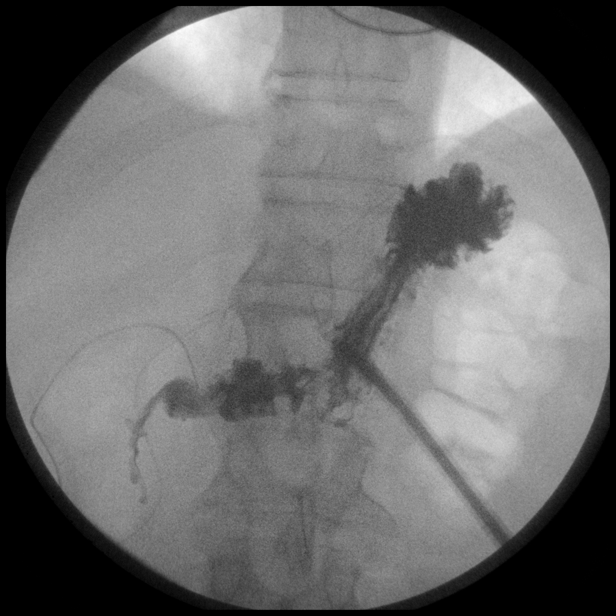

[Series 2: run · 1 of 1 slices shown (2 of 14)]
[im 1/1]
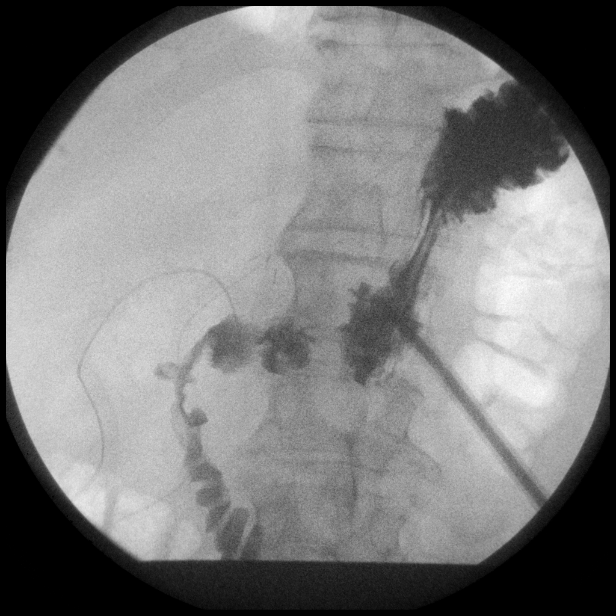

[Series 3: run · 1 of 1 slices shown (3 of 14)]
[im 1/1]
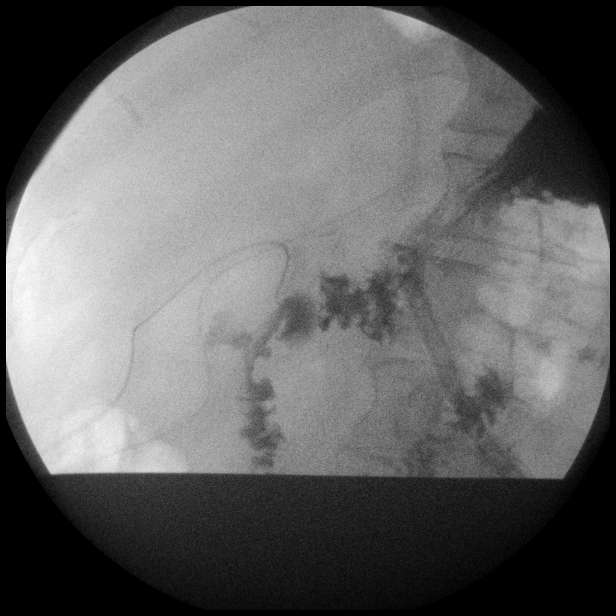

[Series 4: run · 1 of 1 slices shown (4 of 14)]
[im 1/1]
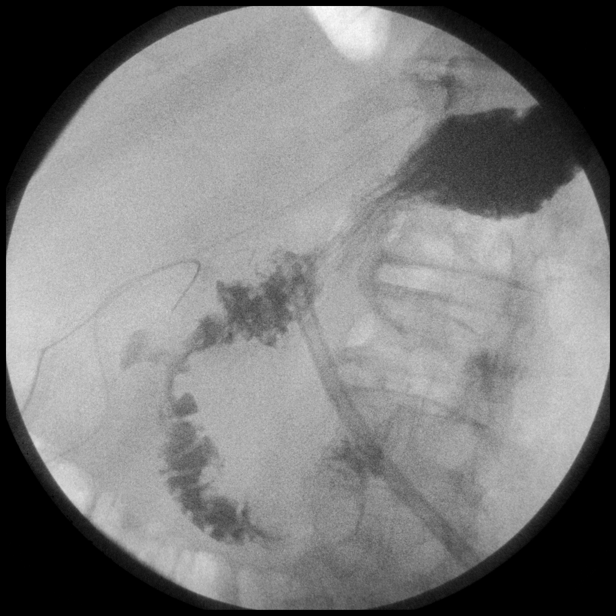

[Series 5: run · 1 of 1 slices shown (5 of 14)]
[im 1/1]
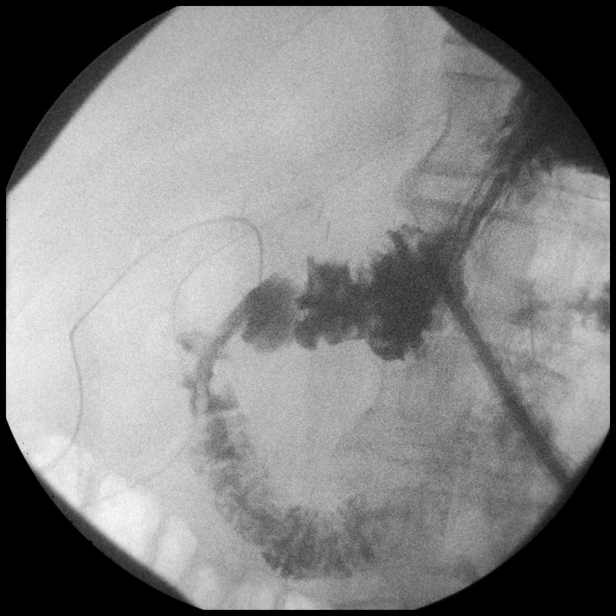

[Series 6: run · 1 of 1 slices shown (6 of 14)]
[im 1/1]
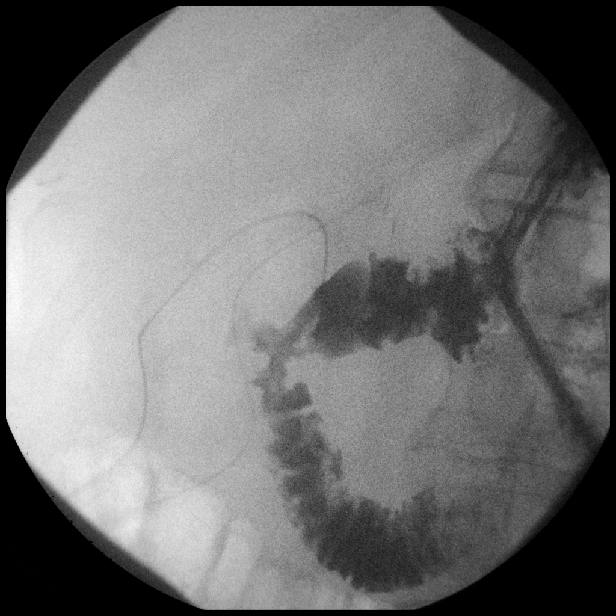

[Series 7: run · 1 of 1 slices shown (7 of 14)]
[im 1/1]
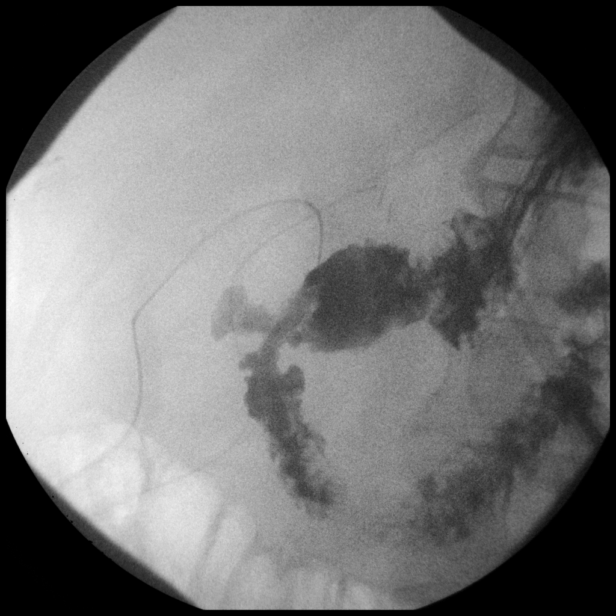

[Series 9: run · 1 of 1 slices shown (8 of 14)]
[im 1/1]
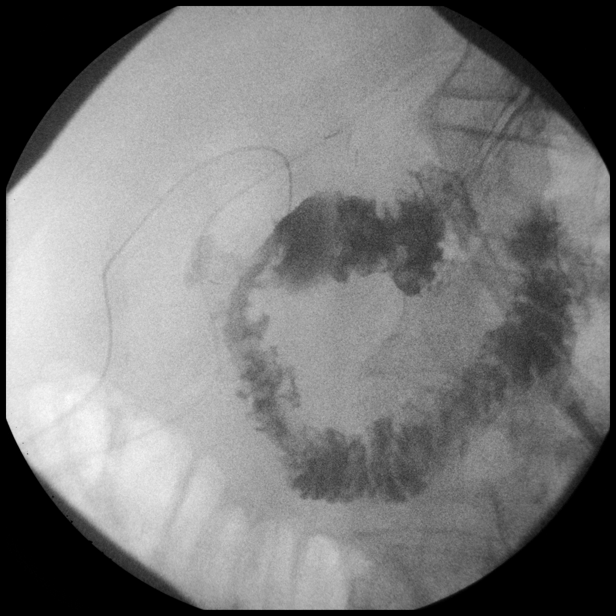

[Series 10: run · 1 of 1 slices shown (9 of 14)]
[im 1/1]
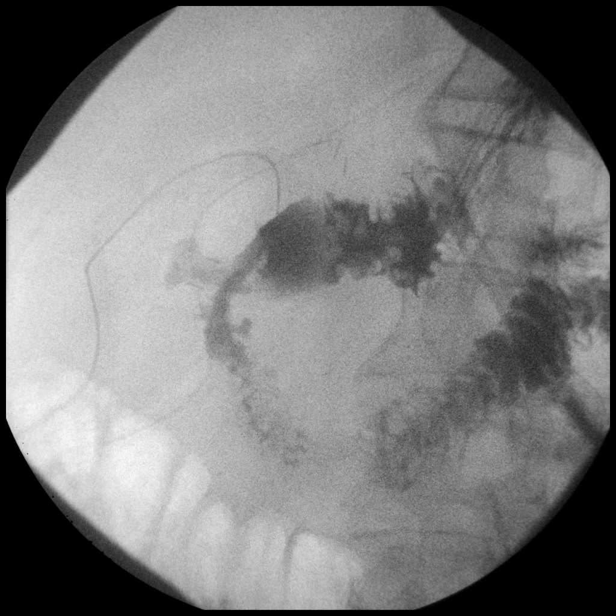

[Series 11: run · 1 of 1 slices shown (10 of 14)]
[im 1/1]
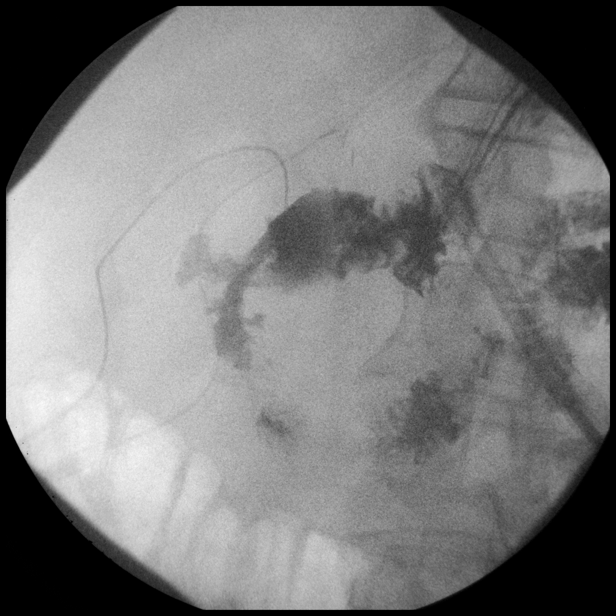

[Series 12: run · 1 of 1 slices shown (11 of 14)]
[im 1/1]
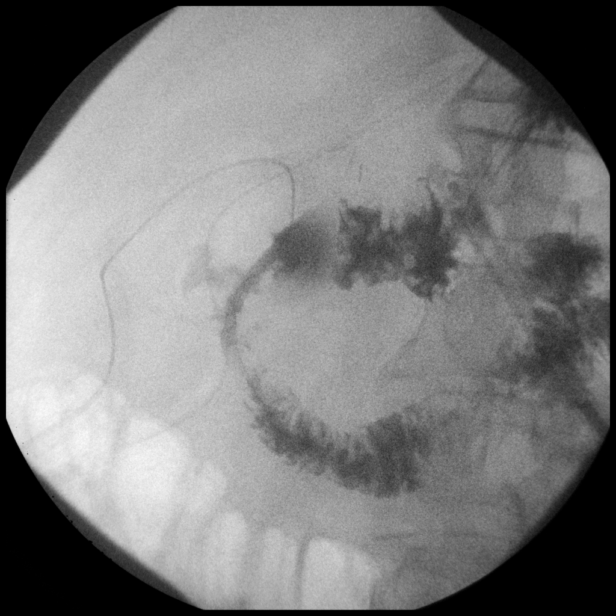

[Series 13: run · 1 of 1 slices shown (12 of 14)]
[im 1/1]
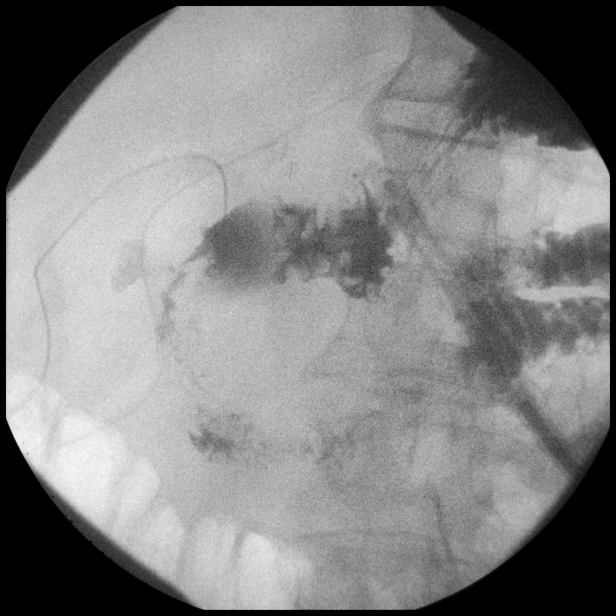

[Series 14: run · 1 of 1 slices shown (13 of 14)]
[im 1/1]
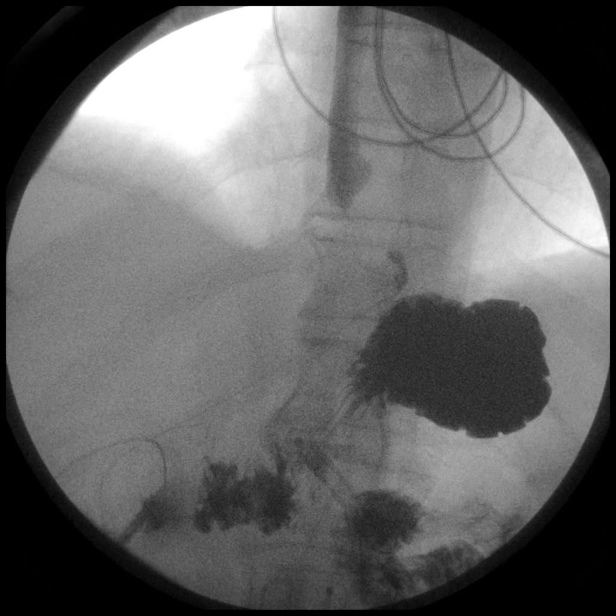

[Series 15: run · 1 of 1 slices shown (14 of 14)]
[im 1/1]
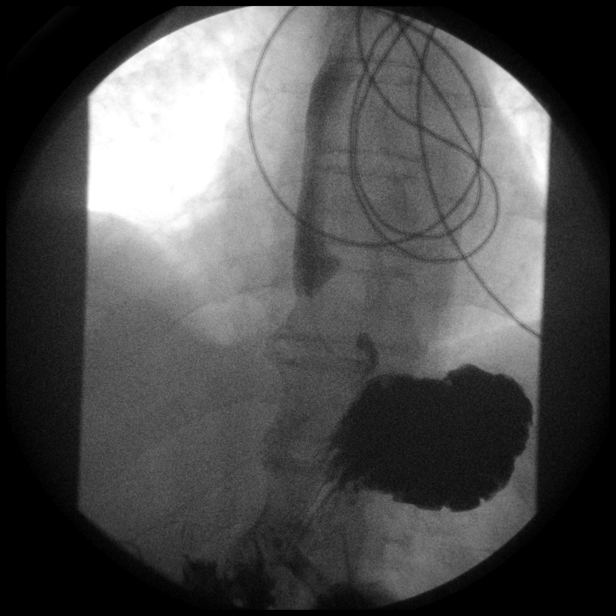

[14 of 15 positions shown; findings below may reference images not displayed]

FINDINGS: Contrast was instilled through the existing gastrostomy tube. No
leaking contrast was identified. There is a wide mouthed duodenum
diverticulum along the anti mesenteric border. This is unusual but
it does correlate with the CT findings. This is in the descending
duodenum just below the duodenum bulb. The stomach is unremarkable.
Moderate GE reflux was demonstrated. This may explain the thickened
esophagus on the CT scan.
IMPRESSION: Duodenum diverticulum.

No leaking oral contrast.

GE reflux may account for the esophageal wall thickening seen on CT
scan.

## 2015-07-29 ENCOUNTER — Encounter: Payer: Self-pay | Admitting: Internal Medicine

## 2015-07-29 ENCOUNTER — Ambulatory Visit (INDEPENDENT_AMBULATORY_CARE_PROVIDER_SITE_OTHER): Payer: Medicare Other | Admitting: Internal Medicine

## 2015-07-29 VITALS — BP 118/70 | HR 73 | Ht 68.0 in | Wt 167.8 lb

## 2015-07-29 DIAGNOSIS — R0989 Other specified symptoms and signs involving the circulatory and respiratory systems: Secondary | ICD-10-CM

## 2015-07-29 DIAGNOSIS — I739 Peripheral vascular disease, unspecified: Secondary | ICD-10-CM

## 2015-07-29 DIAGNOSIS — J449 Chronic obstructive pulmonary disease, unspecified: Secondary | ICD-10-CM | POA: Diagnosis not present

## 2015-07-29 DIAGNOSIS — I2510 Atherosclerotic heart disease of native coronary artery without angina pectoris: Secondary | ICD-10-CM

## 2015-07-29 NOTE — Progress Notes (Signed)
Subjective:     Patient ID: Jon White, male   DOB: [birth year], 82 y.o.   MRN: [id number]  HPI   OV 07/29/2015  Chief Complaint  Patient presents with  . Follow-up    Pt states his breathing is doing well, no change since last OV. Pt states his SOB is at baseline. Pt c/o prod cough with yellow mucus in morning then changes to white mucus thorughout the day. Pt wearing CPAP nightly.    82 year old male with COPD. Presents for routine follow-up. Last seen November 2016. At that time he had issues with bradycardia and a cut his Lopressor down after talking to cardiology. Since then he has seen cardiology and apparently his diagnosis is PVCs. Review of the records confirm this. He has a follow-up echo pending. His Lopressor is now been increased again. He is not having dizziness anymore. He still has chronic exertional fatigue and dyspnea when he climbs one flight of stairs. This is his baseline but it is a lower baseline and then before he had his GI complications. Off note his insurance will not cover his long-acting muscarinic agent Turoza. He wants an alternative. He has not had a Prevnar vaccine and he will have it today  His new complaint is that he's having chronic purple color discoloration of his lower extremity is particularly the left side associated with edema. There might be some associated claudication. It is of insidious onset. It is persistent. It is of long-standing duration of possibly several months. He wants this attended to.        Current outpatient prescriptions:  .  Aclidinium Bromide (TUDORZA PRESSAIR) 400 MCG/ACT AEPB, Inhale 1 puff into the lungs 2 (two) times daily., Disp: 1 each, Rfl: 5 .  albuterol (VENTOLIN HFA) 108 (90 BASE) MCG/ACT inhaler, Inhale 2 puffs into the lungs every 6 (six) hours as needed for wheezing or shortness of breath., Disp: 1 Inhaler, Rfl: 2 .  apixaban (ELIQUIS) 5 MG TABS tablet, Take 1 tablet (5 mg total) by mouth 2 (two) times daily.,  Disp: 60 tablet, Rfl: 0 .  atorvastatin (LIPITOR) 10 MG tablet, Take 10 mg by mouth daily at 6 PM. , Disp: , Rfl:  .  budesonide-formoterol (SYMBICORT) 160-4.5 MCG/ACT inhaler, Inhale 2 puffs into the lungs 2 (two) times daily., Disp: 1 Inhaler, Rfl: 6 .  esomeprazole (NEXIUM) 40 MG capsule, Take 1 capsule (40 mg total) by mouth daily., Disp: 90 capsule, Rfl: 6 .  fluocinonide cream (LIDEX) [id number] %, Apply 1 application topically daily as needed (rash). To infected area., Disp: , Rfl:  .  fluticasone (FLONASE) 50 MCG/ACT nasal spray, Place 2 sprays into the nose at bedtime. Each nostril once a day., Disp: , Rfl:  .  furosemide (LASIX) 40 MG tablet, Take 1 tablet (40 mg total) by mouth daily., Disp: 90 tablet, Rfl: 3 .  loratadine (CLARITIN) 10 MG tablet, Take 10 mg by mouth daily., Disp: , Rfl:  .  metoprolol tartrate (LOPRESSOR) 25 MG tablet, Take 0.5 tablets (12.5 mg total) by mouth 2 (two) times daily., Disp: 90 tablet, Rfl: 3 .  nitroGLYCERIN (NITROSTAT) 0.4 MG SL tablet, PLACE 1 TABLET UNDER THE TONGUE EVERY 5 MINUTES AS NEEDED FOR CHEST PAIN, Disp: 450 tablet, Rfl: 6 .  phenol (CHLORASEPTIC) 1.4 % LIQD, Use as directed 1 spray in the mouth or throat as needed for throat irritation / pain., Disp: , Rfl:  .  tamsulosin (FLOMAX) 0.4 MG CAPS capsule, Take 0.4 mg by mouth  daily., Disp: , Rfl:    Allergies  Allergen Reactions  . Mometasone Furoate Other (See Comments)    Nasonex: CAUSED RED,ITCHY EYES; "lost my eyelashes"   . Naproxen Sodium Nausea And Vomiting  . Nsaids Other (See Comments)    Stomach ulcers  . Anoro Ellipta [Umeclidinium-Vilanterol] Other (See Comments)    Double vision  . Aspirin Hives and Nausea And Vomiting     Chills, sweats  . Breo Ellipta [Fluticasone Furoate-Vilanterol] Hives  . Dexilant [Dexlansoprazole] Diarrhea    Cramps   . Omnaris [Ciclesonide] Other (See Comments)    Nose bleed   . Spiriva Handihaler [Tiotropium Bromide Monohydrate] Other (See Comments)     Hiccups     Immunization History  Administered Date(s) Administered  . Influenza Split 02/26/2013, 02/21/2014, 02/04/2015  . Influenza Whole 03/31/2012  . Pneumococcal Polysaccharide-23 08/30/2003     Review of Systems     Objective:   Physical Exam  Constitutional: He is oriented to person, place, and time. He appears well-developed and well-nourished. No distress.  HENT:  Head: Normocephalic and atraumatic.  Right Ear: External ear normal.  Left Ear: External ear normal.  Mouth/Throat: Oropharynx is clear and moist. No oropharyngeal exudate.  Eyes: Conjunctivae and EOM are normal. Pupils are equal, round, and reactive to light. Right eye exhibits no discharge. Left eye exhibits no discharge. No scleral icterus.  Neck: Normal range of motion. Neck supple. No JVD present. No tracheal deviation present. No thyromegaly present.  Cardiovascular: Normal rate, regular rhythm and intact distal pulses.  Exam reveals no gallop and no friction rub.   No murmur heard. Pulmonary/Chest: Effort normal and breath sounds normal. No respiratory distress. He has no wheezes. He has no rales. He exhibits no tenderness.  Overall diminished air entry  Abdominal: Soft. Bowel sounds are normal. He exhibits no distension and no mass. There is no tenderness. There is no rebound and no guarding.  Visceral obesity with scart +  Musculoskeletal: Normal range of motion. He exhibits edema. He exhibits no tenderness.  Chronic LE discoloration L > R +  Lymphadenopathy:    He has no cervical adenopathy.  Neurological: He is alert and oriented to person, place, and time. He has normal reflexes. No cranial nerve deficit. Coordination normal.  Skin: Skin is warm and dry. No rash noted. He is not diaphoretic. No erythema. No pallor.  Psychiatric: He has a normal mood and affect. His behavior is normal. Judgment and thought content normal.  Nursing note and vitals reviewed.   Filed Vitals:   07/29/15 1359  BP:  118/70  Pulse: 73  Height: 5\' 8"  (1.727 m)  Weight: 167 lb 12.8 oz (76.114 kg)  SpO2: 97%        Assessment:       ICD-9-CM ICD-10-CM   1. Chronic obstructive pulmonary disease, unspecified COPD, unspecified chronic bronchitis type 496 J44.9   2. Poor circulation of extremity (HCC) 443.9 I73.9         Plan:      Change tudorza to incruse due to insurance reasons Continue symbicort daily prevnar 07/29/2015 rfefer pulm rehab at [facility]  Refer VVS for poor circulation of legs  followup  - 6 months or sooner if needed   Dr. Brand Males, M.D., [facility].C.P Pulmonary and Critical Care Medicine Staff Physician [facility] Pager: [phone], If no answer or between  15:00h - 7:00h: call [phone]  07/29/2015 2:19 PM

## 2015-07-29 NOTE — Patient Instructions (Signed)
ICD-9-CM ICD-10-CM   1. Chronic obstructive pulmonary disease, unspecified COPD, unspecified chronic bronchitis type 496 J44.9   2. Poor circulation of extremity (HCC) 443.9 I73.9     Change tudorza to incruse due to insurance reasons Continue symbicort daily prevnar 07/29/2015 rfefer pulm rehab at [facility]  Refer VVS for poor circulation of legs  followup  - 6 months or sooner if needed

## 2015-08-12 ENCOUNTER — Other Ambulatory Visit: Payer: Self-pay | Admitting: *Deleted

## 2015-08-12 DIAGNOSIS — Z86718 Personal history of other venous thrombosis and embolism: Secondary | ICD-10-CM

## 2015-08-12 DIAGNOSIS — R609 Edema, unspecified: Secondary | ICD-10-CM

## 2015-08-18 DIAGNOSIS — K432 Incisional hernia without obstruction or gangrene: Secondary | ICD-10-CM | POA: Diagnosis not present

## 2015-08-25 ENCOUNTER — Other Ambulatory Visit: Payer: Self-pay | Admitting: Adult Health

## 2015-08-26 DIAGNOSIS — I2510 Atherosclerotic heart disease of native coronary artery without angina pectoris: Secondary | ICD-10-CM | POA: Diagnosis not present

## 2015-08-26 DIAGNOSIS — E782 Mixed hyperlipidemia: Secondary | ICD-10-CM | POA: Diagnosis not present

## 2015-08-26 DIAGNOSIS — K269 Duodenal ulcer, unspecified as acute or chronic, without hemorrhage or perforation: Secondary | ICD-10-CM | POA: Diagnosis not present

## 2015-08-26 DIAGNOSIS — I2699 Other pulmonary embolism without acute cor pulmonale: Secondary | ICD-10-CM | POA: Diagnosis not present

## 2015-08-26 DIAGNOSIS — Z0000 Encounter for general adult medical examination without abnormal findings: Secondary | ICD-10-CM | POA: Diagnosis not present

## 2015-08-28 ENCOUNTER — Encounter: Payer: Self-pay | Admitting: Vascular Surgery

## 2015-08-29 ENCOUNTER — Ambulatory Visit (INDEPENDENT_AMBULATORY_CARE_PROVIDER_SITE_OTHER): Payer: Medicare Other | Admitting: Vascular Surgery

## 2015-08-29 ENCOUNTER — Ambulatory Visit (HOSPITAL_COMMUNITY)
Admission: RE | Admit: 2015-08-29 | Discharge: 2015-08-29 | Disposition: A | Discharge status: Home / Self Care | Payer: Medicare Other | Source: Ambulatory Visit | Attending: Vascular Surgery | Admitting: Vascular Surgery

## 2015-08-29 ENCOUNTER — Encounter: Payer: Self-pay | Admitting: Vascular Surgery

## 2015-08-29 VITALS — BP 117/72 | HR 58 | Ht 68.0 in | Wt 168.5 lb

## 2015-08-29 DIAGNOSIS — I87009 Postthrombotic syndrome without complications of unspecified extremity: Secondary | ICD-10-CM

## 2015-08-29 DIAGNOSIS — I2510 Atherosclerotic heart disease of native coronary artery without angina pectoris: Secondary | ICD-10-CM

## 2015-08-29 DIAGNOSIS — Z86718 Personal history of other venous thrombosis and embolism: Secondary | ICD-10-CM | POA: Diagnosis not present

## 2015-08-29 DIAGNOSIS — E782 Mixed hyperlipidemia: Secondary | ICD-10-CM | POA: Insufficient documentation

## 2015-08-29 DIAGNOSIS — J449 Chronic obstructive pulmonary disease, unspecified: Secondary | ICD-10-CM | POA: Insufficient documentation

## 2015-08-29 DIAGNOSIS — I8392 Asymptomatic varicose veins of left lower extremity: Secondary | ICD-10-CM | POA: Diagnosis not present

## 2015-08-29 DIAGNOSIS — I872 Venous insufficiency (chronic) (peripheral): Secondary | ICD-10-CM

## 2015-08-29 DIAGNOSIS — E7800 Pure hypercholesterolemia, unspecified: Secondary | ICD-10-CM | POA: Diagnosis not present

## 2015-08-29 DIAGNOSIS — K219 Gastro-esophageal reflux disease without esophagitis: Secondary | ICD-10-CM | POA: Insufficient documentation

## 2015-08-29 DIAGNOSIS — R609 Edema, unspecified: Secondary | ICD-10-CM | POA: Diagnosis not present

## 2015-08-29 DIAGNOSIS — G4733 Obstructive sleep apnea (adult) (pediatric): Secondary | ICD-10-CM | POA: Diagnosis not present

## 2015-08-29 DIAGNOSIS — N183 Chronic kidney disease, stage 3 (moderate): Secondary | ICD-10-CM | POA: Insufficient documentation

## 2015-08-29 DIAGNOSIS — E1122 Type 2 diabetes mellitus with diabetic chronic kidney disease: Secondary | ICD-10-CM | POA: Insufficient documentation

## 2015-08-29 DIAGNOSIS — I824Y2 Acute embolism and thrombosis of unspecified deep veins of left proximal lower extremity: Secondary | ICD-10-CM

## 2015-08-29 DIAGNOSIS — I252 Old myocardial infarction: Secondary | ICD-10-CM | POA: Diagnosis not present

## 2015-08-29 DIAGNOSIS — I129 Hypertensive chronic kidney disease with stage 1 through stage 4 chronic kidney disease, or unspecified chronic kidney disease: Secondary | ICD-10-CM | POA: Insufficient documentation

## 2015-08-29 NOTE — Progress Notes (Signed)
Referred by:  Merrilee Seashore, MD [address] [place], [place] [address]  Reason for referral: swollen left leg   History of Present Illness  Jon White is a 82 y.o. (1932-07-27) male who presents with chief complaint: swollen left leg.  Patient notes, onset of swelling months ago, associated with non-obvious trigger.  The patient's symptoms include: left leg swelling.  Patient previously has had a LLE DVT after a knee procedure.  The patient has had known history of DVT, known history of varicose vein, no history of venous stasis ulcers, no history of  Lymphedema and no history of skin changes in lower legs.  There is no family history of venous disorders.  The patient has used compression stockings in the past but unable to get them on currently.   Past Medical History  Diagnosis Date  . Emphysema   . Allergic rhinitis   . Asthma   . COPD (chronic obstructive pulmonary disease) ([place])   . Recurrent upper respiratory infection (URI)     coughing from chronic sinusitis   . Normal cardiac stress test     reports stress test was several yrs. ago /w [employer]. Dr. Rogelia Mire, WNL   . OSA (obstructive sleep apnea) 2007    CPAP- report of settings on paper chart, seen by [facility] - seen 07/2011   . Renal stone     some passed spontaneous, & also had cystoscopy  . Neuromuscular disorder ([place])     reports shaking, somedays "can't hold spoon" - generalized  shakiness, is going to talk to MD about it at next appt.   . High cholesterol   . Shortness of breath on exertion   . H/O hiatal hernia   . Chronic renal insufficiency, stage III (moderate)   . Mixed hyperlipidemia   . PE (pulmonary embolism) 10/2011  . Bleeding gastric ulcer   . Allergy   . Sleep apnea     wears c-pap  . Duodenum ulcer     with perforation  . Blood transfusion without reported diagnosis   . Cataract     bilateral removed  . Diabetes mellitus without complication ([place])     no meds-  boarderline  . GERD (gastroesophageal reflux disease)   . Myocardial infarction ([place])   . Prostate cancer ([place]) 1992    in remission.  s/p XRT, melanoma under left eye, left temple  . Hypertension   . DVT of axillary vein, acute left 10/24/2014  . Atrial fibrillation [facility])     Past Surgical History  Procedure Laterality Date  . Lung surgery  1986    presumed from RLL. Segmentectomy. Benign Nodule.   . Sinus endo w/fusion  sch. 09/15/2011    Procedure: ENDOSCOPIC SINUS SURGERY WITH FUSION NAVIGATION;  Surgeon: Ascencion Dike, MD;  Location: [place];  Service: ENT;  Laterality: Bilateral;  bilateral maxillary antrostomy, bilateral endoscopic ethmoidectomy, bilateral sphenoidectomy, bilateral frontal recess exploration  . Septoplasty  09/15/11  . Cataract extraction w/ intraocular lens  implant, bilateral  ~ 2000  . Ureteroscopy  1980's  . Sinus endo w/fusion  09/15/2011    Procedure: ENDOSCOPIC SINUS SURGERY WITH FUSION NAVIGATION;  Surgeon: Ascencion Dike, MD;  Location: [place];  Service: ENT;  Laterality: Bilateral;  . Maxillary antrostomy  09/15/2011    Procedure: MAXILLARY ANTROSTOMY;  Surgeon: Ascencion Dike, MD;  Location: [facility] OR;  Service: ENT;  Laterality: Bilateral;  TOTAL MAXILLARY ANTROSTOMY  . Sinus exploration  09/15/2011    Procedure: SINUS EXPLORATION;  Surgeon: Ascencion Dike, MD;  Location: [facility] OR;  Service: ENT;  Laterality: Bilateral;  FRONTAL RECESS EXPLORATION  . Sphenoidectomy  09/15/2011    Procedure: Coralee Pesa;  Surgeon: Ascencion Dike, MD;  Location: [place];  Service: ENT;  Laterality: Bilateral;  . Septoplasty  09/15/2011    Procedure: SEPTOPLASTY;  Surgeon: Ascencion Dike, MD;  Location: [place];  Service: ENT;  Laterality: Bilateral;  . Laparotomy N/A 08/28/2013    Procedure: EXPLORATORY LAPAROTOMY;  Surgeon: Rolm Bookbinder, MD;  Location: [place];  Service: General;  Laterality: N/A;  . Repair of perforated ulcer N/A 08/28/2013    Procedure: DUODENAL ULCER PATCH;  Surgeon:  Rolm Bookbinder, MD;  Location: [place];  Service: General;  Laterality: N/A;  . Gastrostomy N/A 08/28/2013    Procedure: GASTROSTOMY;  Surgeon: Rolm Bookbinder, MD;  Location: [place];  Service: General;  Laterality: N/A;  . Esophagogastroduodenoscopy N/A 08/28/2013    Procedure: ESOPHAGOGASTRODUODENOSCOPY (EGD);  Surgeon: Gayland Curry, MD;  Location: [place];  Service: General;  Laterality: N/A;  . Melanoma excision Left     under left eye  . Esophagogastroduodenoscopy N/A 04/03/2014    Procedure: ESOPHAGOGASTRODUODENOSCOPY (EGD);  Surgeon: Inda Castle, MD;  Location: [facility];  Service: Endoscopy;  Laterality: N/A;  . Colonoscopy N/A 04/03/2014    Procedure: COLONOSCOPY;  Surgeon: Inda Castle, MD;  Location: [facility];  Service: Endoscopy;  Laterality: N/A;  . Lobectomy Right 1986    1/3 removed     Social History   Social History  . Marital Status: Married    Spouse Name: N/A  . Number of Children: 4  . Years of Education: N/A   Occupational History  . 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     Social History Main Topics  . Smoking status: Former Smoker -- 1.00 packs/day for 60 years    Types: Cigarettes    Quit date: 08/06/2011  . Smokeless tobacco: Never Used  . Alcohol Use: 0.0 oz/week    0 Standard drinks or equivalent per week     Comment: rarely  . Drug Use: No  . Sexual Activity: No   Other Topics Concern  . Not on file   Social History Narrative   Traveled to [place].   Married and lives with wife.   Originally from [place].   Spends winters in [place]   Has 2 kids in [place]          Family History  Problem Relation Age of Onset  . Prostate cancer Brother   . Prostate cancer Brother   . Prostate cancer Brother   . Asthma Father   . Heart disease Mother     MI age 67  . Heart attack Mother   . Anesthesia problems Neg Hx   . Colon cancer Neg Hx   . Esophageal cancer Neg Hx   . Rectal cancer Neg Hx   . Stomach cancer Neg Hx   . Stroke Sister      Current Outpatient Prescriptions  Medication Sig Dispense Refill  . Aclidinium Bromide (TUDORZA PRESSAIR) 400 MCG/ACT AEPB Inhale 1 puff into the lungs 2 (two) times daily. 1 each 5  . albuterol (VENTOLIN HFA) 108 (90 BASE) MCG/ACT inhaler Inhale 2 puffs into the lungs every 6 (six) hours as needed for wheezing or shortness of breath. 1 Inhaler 2  . apixaban (ELIQUIS) 5 MG TABS tablet Take 1 tablet (5 mg total) by mouth 2 (two)  times daily. 60 tablet 0  . atorvastatin (LIPITOR) 10 MG tablet Take 10 mg by mouth daily at 6 PM.     . budesonide-formoterol (SYMBICORT) 160-4.5 MCG/ACT inhaler Inhale 2 puffs into the lungs 2 (two) times daily. 1 Inhaler 6  . esomeprazole (NEXIUM) 40 MG capsule Take 1 capsule (40 mg total) by mouth daily. 90 capsule 6  . fluocinonide cream (LIDEX) [id number] % Apply 1 application topically daily as needed (rash). To infected area.    . fluticasone (FLONASE) 50 MCG/ACT nasal spray Place 2 sprays into the nose at bedtime. Each nostril once a day.    . furosemide (LASIX) 40 MG tablet Take 1 tablet (40 mg total) by mouth daily. 90 tablet 3  . loratadine (CLARITIN) 10 MG tablet Take 10 mg by mouth daily.    . metoprolol tartrate (LOPRESSOR) 25 MG tablet Take 0.5 tablets (12.5 mg total) by mouth 2 (two) times daily. 90 tablet 3  . nitroGLYCERIN (NITROSTAT) 0.4 MG SL tablet PLACE 1 TABLET UNDER THE TONGUE EVERY 5 MINUTES AS NEEDED FOR CHEST PAIN 450 tablet 6  . phenol (CHLORASEPTIC) 1.4 % LIQD Use as directed 1 spray in the mouth or throat as needed for throat irritation / pain.    . tamsulosin (FLOMAX) 0.4 MG CAPS capsule Take 0.4 mg by mouth daily.     No current facility-administered medications for this visit.    Allergies  Allergen Reactions  . Mometasone Furoate Other (See Comments)    Nasonex: CAUSED RED,ITCHY EYES; "lost my eyelashes"   . Naproxen Sodium Nausea And Vomiting  . Nsaids Other (See Comments)    Stomach ulcers  . Anoro Ellipta  [Umeclidinium-Vilanterol] Other (See Comments)    Double vision  . Aspirin Hives and Nausea And Vomiting     Chills, sweats  . Breo Ellipta [Fluticasone Furoate-Vilanterol] Hives  . Dexilant [Dexlansoprazole] Diarrhea    Cramps   . Omnaris [Ciclesonide] Other (See Comments)    Nose bleed   . Spiriva Handihaler [Tiotropium Bromide Monohydrate] Other (See Comments)    Hiccups      REVIEW OF SYSTEMS:  (Positives checked otherwise negative)  CARDIOVASCULAR:   [ ]  chest pain,  [ ]  chest pressure,  [x]  palpitations,  [ ]  shortness of breath when laying flat,  [ x shortness of breath with exertion,   [ ]  pain in feet when walking,  [ ]  pain in feet when laying flat, [x]  history of blood clot in veins (DVT),  [ ]  history of phlebitis,  [x]  swelling in legs,  [ ]  varicose veins  PULMONARY:   [ ]  productive cough,  [ ]  asthma,  [ ]  wheezing  NEUROLOGIC:   [ ]  weakness in arms or legs,  [ ]  numbness in arms or legs,  [ ]  difficulty speaking or slurred speech,  [ ]  temporary loss of vision in one eye,  [x]  dizziness  HEMATOLOGIC:   [ ]  bleeding problems,  [ ]  problems with blood clotting too easily  MUSCULOSKEL:   [ ]  joint pain, [ ]  joint swelling  GASTROINTEST:   [ ]  vomiting blood,  [ ]  blood in stool     GENITOURINARY:   [ ]  burning with urination,  [ ]  blood in urine  PSYCHIATRIC:   [ ]  history of major depression  INTEGUMENTARY:   [x]  rashes,  [ ]  ulcers  CONSTITUTIONAL:   [ ]  fever,  [ ]  chills   Physical Examination  Filed Vitals:   08/29/15  1202  BP: 117/72  Pulse: 58  Height: 5\' 8"  (1.727 m)  Weight: 168 lb 8 oz (76.431 kg)  SpO2: 94%   Body mass index is 25.63 kg/(m^2).  General: A&O x 3, WDWN  Head: [place]/AT  Ear/Nose/Throat: Hearing grossly intact, nares without erythema or drainage, oropharynx without Erythema/Exudate, Mallampati score: 3  Eyes: PERRLA, EOMI  Neck: Supple, no nuchal rigidity, no palpable LAD  Pulmonary: Sym  exp, good air movt, CTAB, no rales, rhonchi, & wheezing  Cardiac: RRR, Nl S1, S2, no Murmurs, rubs or gallops  Vascular: Vessel Right Left  Radial Palpable Palpable  Brachial Palpable Palpable  Carotid Palpable, without bruit Palpable, without bruit  Aorta Not palpable N/A  Femoral Palpable Palpable  Popliteal Not palpable Not palpable  PT Not Palpable Not Palpable  DP Faintly Palpable Faintly Palpable   Gastrointestinal: soft, NTND, no G/R, no HSM, no masses, no CVAT B  Musculoskeletal: M/S 5/5 throughout , Extremities without ischemic changes , no LDS, LLE edema 1-2+, RLE edema 1+, cyanotic toes which resolve with shift to neutral position  Neurologic: CN 2-12 intact , Pain and light touch intact in extremities , Motor exam as listed above  Psychiatric: Judgment intact, Mood & affect appropriate for pt's clinical situation  Dermatologic: See M/S exam for extremity exam, no rashes otherwise noted  Lymph : No Cervical, Axillary, or Inguinal lymphadenopathy    Non-Invasive Vascular Imaging  LLE Venous Insufficiency Duplex (Date: 08/29/2015):   no DVT and SVT,   minimal GSV reflux in calf,  + SSV reflux,  + deep venous reflux: CFV, SFV, PV   Medical Decision Making  DEMONT DOCHERTY is a 82 y.o. male who presents with: LLE chronic venous insufficiency (C2), likely RLE CVI, B varicose veins with complications   Based on the patient's history and examination, I recommend: compressive therapy.  This patient's LLE reflux is likely related to prior DVT, i.e. Post-phlebitic syndrome.  With primarily deep venous reflux, there is no simple intervention possible.  This patient's CVI is not severe enough to consider referring him to a vein center that specializes in venous valvular transplantation.  Thank you for allowing [place] to participate in this patient's care.   Adele Barthel, MD [facility] Office: [phone] Pager:  [phone]  08/29/2015, 1:02 PM

## 2015-09-02 DIAGNOSIS — J449 Chronic obstructive pulmonary disease, unspecified: Secondary | ICD-10-CM | POA: Diagnosis not present

## 2015-09-02 DIAGNOSIS — E782 Mixed hyperlipidemia: Secondary | ICD-10-CM | POA: Diagnosis not present

## 2015-09-02 DIAGNOSIS — I214 Non-ST elevation (NSTEMI) myocardial infarction: Secondary | ICD-10-CM | POA: Diagnosis not present

## 2015-09-02 DIAGNOSIS — I2510 Atherosclerotic heart disease of native coronary artery without angina pectoris: Secondary | ICD-10-CM | POA: Diagnosis not present

## 2015-09-15 ENCOUNTER — Other Ambulatory Visit: Payer: Self-pay | Admitting: Adult Health

## 2015-09-16 ENCOUNTER — Telehealth: Payer: Self-pay | Admitting: Internal Medicine

## 2015-09-16 MED ORDER — APIXABAN 5 MG PO TABS: 5.0000 mg | ORAL_TABLET | Freq: Two times a day (BID) | ORAL | Status: DC

## 2015-09-16 NOTE — Telephone Encounter (Signed)
Called, spoke with pt.  Requesting eliquis rx.  Per chart, Eliquis rx was last given on 11/02/14 #60 x 0.  However, notes state pt is to continue daily.  [facility], spoke with Joycelyn Schmid.  Was advised Eliquis rx was called in by our office on 11/13/14 for #60 x 5 under Tammy Parrett's name. Eliquis rx sent to [facility].  Pt aware, was very appreciative of our help, and voiced no further questions or concerns at this time.

## 2015-09-23 DIAGNOSIS — J310 Chronic rhinitis: Secondary | ICD-10-CM | POA: Diagnosis not present

## 2015-09-23 DIAGNOSIS — J330 Polyp of nasal cavity: Secondary | ICD-10-CM | POA: Diagnosis not present

## 2015-09-23 DIAGNOSIS — D485 Neoplasm of uncertain behavior of skin: Secondary | ICD-10-CM | POA: Diagnosis not present

## 2015-10-02 ENCOUNTER — Telehealth: Payer: Self-pay | Admitting: Cardiovascular Disease

## 2015-10-02 NOTE — Telephone Encounter (Signed)
New Message  Pt has echo sched for 5/30- has f/u w/ Dr Lovena Le for 6/6- pt requested to speak w/ RN concerning pt f/u- he wanted to know if he should be seen sooner after the echo or for his ROV in July- Please call back and discuss.

## 2015-10-02 NOTE — Telephone Encounter (Signed)
Spoke with pt. He has echo scheduled for 10/28/15 and appt with Dr. Lovena Le on 11/04/15. If everything is OK with echo and appt he is planning on going out of town in mid June until October.  Is due for 6 month follow up with Dr. Angelena Form.  He is asking if he needs to see Dr. Angelena Form sooner than July or if OK to wait until October.  He reports he is feeling well.  Will review with Dr. Angelena Form

## 2015-10-03 DIAGNOSIS — C44222 Squamous cell carcinoma of skin of right ear and external auricular canal: Secondary | ICD-10-CM | POA: Diagnosis not present

## 2015-10-03 DIAGNOSIS — D485 Neoplasm of uncertain behavior of skin: Secondary | ICD-10-CM | POA: Diagnosis not present

## 2015-10-03 NOTE — Telephone Encounter (Signed)
Spoke with pt and told him OK to wait until October to see Dr. Angelena Form as long as he continues to feel OK

## 2015-10-03 NOTE — Telephone Encounter (Signed)
OK to wait to see me if he is feeling well. Jon White

## 2015-10-06 ENCOUNTER — Encounter: Payer: Self-pay | Admitting: Gastroenterology

## 2015-10-06 ENCOUNTER — Emergency Department (HOSPITAL_COMMUNITY)
Admission: EM | Admit: 2015-10-06 | Discharge: 2015-10-06 | Disposition: A | Discharge status: Home / Self Care | Payer: Medicare Other | Attending: Emergency Medicine | Admitting: Emergency Medicine

## 2015-10-06 ENCOUNTER — Emergency Department (HOSPITAL_COMMUNITY): Payer: Medicare Other

## 2015-10-06 ENCOUNTER — Encounter (HOSPITAL_COMMUNITY): Payer: Self-pay | Admitting: *Deleted

## 2015-10-06 DIAGNOSIS — I252 Old myocardial infarction: Secondary | ICD-10-CM | POA: Insufficient documentation

## 2015-10-06 DIAGNOSIS — E782 Mixed hyperlipidemia: Secondary | ICD-10-CM | POA: Insufficient documentation

## 2015-10-06 DIAGNOSIS — I129 Hypertensive chronic kidney disease with stage 1 through stage 4 chronic kidney disease, or unspecified chronic kidney disease: Secondary | ICD-10-CM | POA: Diagnosis not present

## 2015-10-06 DIAGNOSIS — Z86711 Personal history of pulmonary embolism: Secondary | ICD-10-CM | POA: Insufficient documentation

## 2015-10-06 DIAGNOSIS — J449 Chronic obstructive pulmonary disease, unspecified: Secondary | ICD-10-CM | POA: Diagnosis not present

## 2015-10-06 DIAGNOSIS — Z8546 Personal history of malignant neoplasm of prostate: Secondary | ICD-10-CM | POA: Diagnosis not present

## 2015-10-06 DIAGNOSIS — Z87442 Personal history of urinary calculi: Secondary | ICD-10-CM | POA: Insufficient documentation

## 2015-10-06 DIAGNOSIS — E7800 Pure hypercholesterolemia, unspecified: Secondary | ICD-10-CM | POA: Insufficient documentation

## 2015-10-06 DIAGNOSIS — Z87891 Personal history of nicotine dependence: Secondary | ICD-10-CM | POA: Insufficient documentation

## 2015-10-06 DIAGNOSIS — Z86718 Personal history of other venous thrombosis and embolism: Secondary | ICD-10-CM | POA: Insufficient documentation

## 2015-10-06 DIAGNOSIS — E1122 Type 2 diabetes mellitus with diabetic chronic kidney disease: Secondary | ICD-10-CM | POA: Insufficient documentation

## 2015-10-06 DIAGNOSIS — I4891 Unspecified atrial fibrillation: Secondary | ICD-10-CM | POA: Diagnosis not present

## 2015-10-06 DIAGNOSIS — L03311 Cellulitis of abdominal wall: Secondary | ICD-10-CM

## 2015-10-06 DIAGNOSIS — R1033 Periumbilical pain: Secondary | ICD-10-CM

## 2015-10-06 DIAGNOSIS — Z8669 Personal history of other diseases of the nervous system and sense organs: Secondary | ICD-10-CM | POA: Insufficient documentation

## 2015-10-06 DIAGNOSIS — Z79899 Other long term (current) drug therapy: Secondary | ICD-10-CM | POA: Insufficient documentation

## 2015-10-06 DIAGNOSIS — N183 Chronic kidney disease, stage 3 (moderate): Secondary | ICD-10-CM | POA: Insufficient documentation

## 2015-10-06 DIAGNOSIS — K5730 Diverticulosis of large intestine without perforation or abscess without bleeding: Secondary | ICD-10-CM | POA: Diagnosis not present

## 2015-10-06 DIAGNOSIS — R109 Unspecified abdominal pain: Secondary | ICD-10-CM | POA: Diagnosis present

## 2015-10-06 DIAGNOSIS — Z7951 Long term (current) use of inhaled steroids: Secondary | ICD-10-CM | POA: Insufficient documentation

## 2015-10-06 DIAGNOSIS — K219 Gastro-esophageal reflux disease without esophagitis: Secondary | ICD-10-CM | POA: Diagnosis not present

## 2015-10-06 LAB — COMPREHENSIVE METABOLIC PANEL
ALT: 15 U/L — ABNORMAL LOW (ref 17–63)
ANION GAP: 11 (ref 5–15)
AST: 19 U/L (ref 15–41)
Albumin: 3.1 g/dL — ABNORMAL LOW (ref 3.5–5.0)
Alkaline Phosphatase: 87 U/L (ref 38–126)
BILIRUBIN TOTAL: 0.8 mg/dL (ref 0.3–1.2)
BUN: 21 mg/dL — ABNORMAL HIGH (ref 6–20)
CHLORIDE: 100 mmol/L — AB (ref 101–111)
CO2: 28 mmol/L (ref 22–32)
Calcium: 9.1 mg/dL (ref 8.9–10.3)
Creatinine, Ser: 1.58 mg/dL — ABNORMAL HIGH (ref 0.61–1.24)
GFR, EST AFRICAN AMERICAN: 46 mL/min — AB (ref >60)
GFR, EST NON AFRICAN AMERICAN: 40 mL/min — AB (ref >60)
Glucose, Bld: 119 mg/dL — ABNORMAL HIGH (ref 65–99)
POTASSIUM: 4.6 mmol/L (ref 3.5–5.1)
Sodium: 139 mmol/L (ref 135–145)
TOTAL PROTEIN: 7.5 g/dL (ref 6.5–8.1)

## 2015-10-06 LAB — CBC
HEMATOCRIT: 45.2 % (ref 39.0–52.0)
HEMOGLOBIN: 14.7 g/dL (ref 13.0–17.0)
MCH: 30.1 pg (ref 26.0–34.0)
MCHC: 32.5 g/dL (ref 30.0–36.0)
MCV: 92.6 fL (ref 78.0–100.0)
Platelets: 218 10*3/uL (ref 150–400)
RBC: 4.88 MIL/uL (ref 4.22–5.81)
RDW: 15.5 % (ref 11.5–15.5)
WBC: 11 10*3/uL — AB (ref 4.0–10.5)

## 2015-10-06 LAB — LIPASE, BLOOD: LIPASE: 18 U/L (ref 11–51)

## 2015-10-06 MED ORDER — CEPHALEXIN 500 MG PO CAPS: 500.0000 mg | ORAL_CAPSULE | Freq: Three times a day (TID) | ORAL | Status: DC

## 2015-10-06 MED ORDER — IOPAMIDOL (ISOVUE-300) INJECTION 61%
INTRAVENOUS | Status: AC
  Filled 2015-10-06: qty 75

## 2015-10-06 MED ORDER — SULFAMETHOXAZOLE-TRIMETHOPRIM 800-160 MG PO TABS: 1.0000 | ORAL_TABLET | Freq: Two times a day (BID) | ORAL | Status: AC

## 2015-10-06 MED ORDER — CEPHALEXIN 250 MG PO CAPS
500.0000 mg | ORAL_CAPSULE | Freq: Once | ORAL | Status: AC
  Administered 2015-10-06: 500 mg via ORAL
  Filled 2015-10-06: qty 2

## 2015-10-06 MED ORDER — SULFAMETHOXAZOLE-TRIMETHOPRIM 800-160 MG PO TABS
1.0000 | ORAL_TABLET | Freq: Once | ORAL | Status: AC
  Administered 2015-10-06: 1 via ORAL
  Filled 2015-10-06: qty 1

## 2015-10-06 NOTE — ED Notes (Signed)
Pt reports having abd pain over two years ago, having increase in mid abd pain for past week and has redness noted to umbilical area. Denies n/v/d or fever.

## 2015-10-06 NOTE — ED Provider Notes (Signed)
CSN: [id number]     Arrival date & time 10/06/15  1130 History   First MD Initiated Contact with Patient 10/06/15 1649     Chief Complaint  Patient presents with  . Abdominal Pain    HPI Comments: 82 year old male presents with abdominal pain for the past 3-4 days. Past medical history significant for CAD, COPD, CKD, perforated ulcer status post surgery complicated by a wound infection 2 years ago. Denies other abdominal surgeries. Patient states that the pain was intermittent and now has become constant. He does have intermittent abdominal pain after his surgery however he states that this is a different pain and has become constant. The redness has developed in the past 2 days. Denies fever, chills, chest pain, shortness of breath, nausea, vomiting, diarrhea, constipation, blood in his stool, dysuria. Denies associated with PO intake.   Past Medical History  Diagnosis Date  . Emphysema   . Allergic rhinitis   . Asthma   . COPD (chronic obstructive pulmonary disease) ([place])   . Recurrent upper respiratory infection (URI)     coughing from chronic sinusitis   . Normal cardiac stress test     reports stress test was several yrs. ago /w [employer]. Dr. Rogelia Mire, WNL   . OSA (obstructive sleep apnea) 2007    CPAP- report of settings on paper chart, seen by [facility] - seen 07/2011   . Renal stone     some passed spontaneous, & also had cystoscopy  . Neuromuscular disorder ([place])     reports shaking, somedays "can't hold spoon" - generalized  shakiness, is going to talk to MD about it at next appt.   . High cholesterol   . Shortness of breath on exertion   . H/O hiatal hernia   . Chronic renal insufficiency, stage III (moderate)   . Mixed hyperlipidemia   . PE (pulmonary embolism) 10/2011  . Bleeding gastric ulcer   . Allergy   . Sleep apnea     wears c-pap  . Duodenum ulcer     with perforation  . Blood transfusion without reported diagnosis   . Cataract     bilateral removed  .  Diabetes mellitus without complication ([place])     no meds- boarderline  . GERD (gastroesophageal reflux disease)   . Myocardial infarction ([place])   . Prostate cancer ([place]) 1992    in remission.  s/p XRT, melanoma under left eye, left temple  . Hypertension   . DVT of axillary vein, acute left 10/24/2014  . Atrial fibrillation [facility])    Past Surgical History  Procedure Laterality Date  . Lung surgery  1986    presumed from RLL. Segmentectomy. Benign Nodule.   . Sinus endo w/fusion  sch. 09/15/2011    Procedure: ENDOSCOPIC SINUS SURGERY WITH FUSION NAVIGATION;  Surgeon: Ascencion Dike, MD;  Location: [place];  Service: ENT;  Laterality: Bilateral;  bilateral maxillary antrostomy, bilateral endoscopic ethmoidectomy, bilateral sphenoidectomy, bilateral frontal recess exploration  . Septoplasty  09/15/11  . Cataract extraction w/ intraocular lens  implant, bilateral  ~ 2000  . Ureteroscopy  1980's  . Sinus endo w/fusion  09/15/2011    Procedure: ENDOSCOPIC SINUS SURGERY WITH FUSION NAVIGATION;  Surgeon: Ascencion Dike, MD;  Location: [place];  Service: ENT;  Laterality: Bilateral;  . Maxillary antrostomy  09/15/2011    Procedure: MAXILLARY ANTROSTOMY;  Surgeon: Ascencion Dike, MD;  Location[facility] OR;  Service: ENT;  Laterality: Bilateral;  TOTAL MAXILLARY ANTROSTOMY  .  Sinus exploration  09/15/2011    Procedure: SINUS EXPLORATION;  Surgeon: Ascencion Dike, MD;  Location: [facility] OR;  Service: ENT;  Laterality: Bilateral;  FRONTAL RECESS EXPLORATION  . Sphenoidectomy  09/15/2011    Procedure: Coralee Pesa;  Surgeon: Ascencion Dike, MD;  Location: [place];  Service: ENT;  Laterality: Bilateral;  . Septoplasty  09/15/2011    Procedure: SEPTOPLASTY;  Surgeon: Ascencion Dike, MD;  Location: [place];  Service: ENT;  Laterality: Bilateral;  . Laparotomy N/A 08/28/2013    Procedure: EXPLORATORY LAPAROTOMY;  Surgeon: Rolm Bookbinder, MD;  Location: [place];  Service: General;  Laterality: N/A;  . Repair of perforated ulcer N/A  08/28/2013    Procedure: DUODENAL ULCER PATCH;  Surgeon: Rolm Bookbinder, MD;  Location: [place];  Service: General;  Laterality: N/A;  . Gastrostomy N/A 08/28/2013    Procedure: GASTROSTOMY;  Surgeon: Rolm Bookbinder, MD;  Location: [place];  Service: General;  Laterality: N/A;  . Esophagogastroduodenoscopy N/A 08/28/2013    Procedure: ESOPHAGOGASTRODUODENOSCOPY (EGD);  Surgeon: Gayland Curry, MD;  Location: [place];  Service: General;  Laterality: N/A;  . Melanoma excision Left     under left eye  . Esophagogastroduodenoscopy N/A 04/03/2014    Procedure: ESOPHAGOGASTRODUODENOSCOPY (EGD);  Surgeon: Inda Castle, MD;  [facility];  Service: Endoscopy;  Laterality: N/A;  . Colonoscopy N/A 04/03/2014    Procedure: COLONOSCOPY;  Surgeon: Inda Castle, MD;  Location: [facility];  Service: Endoscopy;  Laterality: N/A;  . Lobectomy Right 1986    1/3 removed    Family History  Problem Relation Age of Onset  . Prostate cancer Brother   . Prostate cancer Brother   . Prostate cancer Brother   . Asthma Father   . Heart disease Mother     MI age 78  . Heart attack Mother   . Anesthesia problems Neg Hx   . Colon cancer Neg Hx   . Esophageal cancer Neg Hx   . Rectal cancer Neg Hx   . Stomach cancer Neg Hx   . Stroke Sister    Social History  Substance Use Topics  . Smoking status: Former Smoker -- 1.00 packs/day for 60 years    Types: Cigarettes    Quit date: 08/06/2011  . Smokeless tobacco: Never Used  . Alcohol Use: 0.0 oz/week    0 Standard drinks or equivalent per week     Comment: rarely    Review of Systems  Constitutional: Negative for fever and chills.  Respiratory: Negative for shortness of breath.   Cardiovascular: Negative for chest pain.  Gastrointestinal: Positive for abdominal pain. Negative for nausea, vomiting, diarrhea, constipation and blood in stool.  Genitourinary: Negative for dysuria.  All other systems reviewed and are negative.  Allergies    Mometasone furoate; Naproxen sodium; Nsaids; Anoro ellipta; Aspirin; Breo ellipta; Dexilant; Omnaris; and Spiriva handihaler  Home Medications   Prior to Admission medications   Medication Sig Start Date End Date Taking? Authorizing Provider  Aclidinium Bromide (TUDORZA PRESSAIR) 400 MCG/ACT AEPB Inhale 1 puff into the lungs 2 (two) times daily. 10/23/14   Brand Males, MD  albuterol (VENTOLIN HFA) 108 (90 BASE) MCG/ACT inhaler Inhale 2 puffs into the lungs every 6 (six) hours as needed for wheezing or shortness of breath. 10/23/14   Brand Males, MD  apixaban (ELIQUIS) 5 MG TABS tablet Take 1 tablet (5 mg total) by mouth 2 (two) times daily. 09/16/15   Brand Males, MD  atorvastatin (LIPITOR) 10 MG  tablet Take 10 mg by mouth daily at 6 PM.     Historical Provider, MD  budesonide-formoterol (SYMBICORT) 160-4.5 MCG/ACT inhaler Inhale 2 puffs into the lungs 2 (two) times daily. 10/23/14   Brand Males, MD  esomeprazole (NEXIUM) 40 MG capsule Take 1 capsule (40 mg total) by mouth daily. 05/02/15   Mauri Pole, MD  fluocinonide cream (LIDEX) [id number] % Apply 1 application topically daily as needed (rash). To infected area.    Historical Provider, MD  fluticasone (FLONASE) 50 MCG/ACT nasal spray Place 2 sprays into the nose at bedtime. Each nostril once a day.    Historical Provider, MD  furosemide (LASIX) 40 MG tablet Take 1 tablet (40 mg total) by mouth daily. 06/12/15   Burnell Blanks, MD  loratadine (CLARITIN) 10 MG tablet Take 10 mg by mouth daily.    Historical Provider, MD  metoprolol tartrate (LOPRESSOR) 25 MG tablet Take 0.5 tablets (12.5 mg total) by mouth 2 (two) times daily. 06/12/15   Burnell Blanks, MD  nitroGLYCERIN (NITROSTAT) 0.4 MG SL tablet PLACE 1 TABLET UNDER THE TONGUE EVERY 5 MINUTES AS NEEDED FOR CHEST PAIN 05/01/15   Burnell Blanks, MD  phenol (CHLORASEPTIC) 1.4 % LIQD Use as directed 1 spray in the mouth or throat as needed for throat  irritation / pain.    Historical Provider, MD  tamsulosin (FLOMAX) 0.4 MG CAPS capsule Take 0.4 mg by mouth daily. 11/06/14   Historical Provider, MD   BP 158/79 mmHg  Pulse 66  Temp(Src) 98.1 F (36.7 C) (Oral)  Resp 16  SpO2 97%   Physical Exam  Constitutional: He is oriented to person, place, and time. He appears well-developed and well-nourished. No distress.  HENT:  Head: Normocephalic and atraumatic.  Eyes: Conjunctivae are normal. Pupils are equal, round, and reactive to light. Right eye exhibits no discharge. Left eye exhibits no discharge. No scleral icterus.  Neck: Normal range of motion.  Cardiovascular: Normal rate and regular rhythm.  Exam reveals no gallop and no friction rub.   No murmur heard. Pulmonary/Chest: Effort normal and breath sounds normal. No respiratory distress. He has no wheezes. He has no rales. He exhibits no tenderness.  Abdominal: Bowel sounds are normal. He exhibits no distension and no mass. There is tenderness. There is no rebound and no guarding.  Well healed midline abdominal scar. Area of induration around the umbilicus with surrounding erythema with tenderness to palpation. No drainage.  Neurological: He is alert and oriented to person, place, and time.  Skin: Skin is warm and dry.  Psychiatric: He has a normal mood and affect.    ED Course  Procedures (including critical care time) Labs Review Labs Reviewed  COMPREHENSIVE METABOLIC PANEL - Abnormal; Notable for the following:    Chloride 100 (*)    Glucose, Bld 119 (*)    BUN 21 (*)    Creatinine, Ser 1.58 (*)    Albumin 3.1 (*)    ALT 15 (*)    GFR calc non Af Amer 40 (*)    GFR calc Af Amer 46 (*)    All other components within normal limits  CBC - Abnormal; Notable for the following:    WBC 11.0 (*)    All other components within normal limits  LIPASE, BLOOD    Imaging Review Ct Abdomen Pelvis Wo Contrast  10/06/2015  CLINICAL DATA:  Periumbilical pain, right lower quadrant  pain. EXAM: CT ABDOMEN AND PELVIS WITHOUT CONTRAST TECHNIQUE: Multidetector CT imaging  of the abdomen and pelvis was performed following the standard protocol without IV contrast. COMPARISON:  08/20/2014 FINDINGS: Emphysematous changes noted in the lung bases. No confluent opacities or effusions. Heart is normal size. Liver, gallbladder, spleen, pancreas, adrenals have an unremarkable unenhanced appearance. Bilateral renal low-density lesions are again noted and stable since prior study, most compatible with cysts. The largest noted in the left midpole measuring 3.4 cm. No hydronephrosis. Small punctate nonobstructing left midpole renal stone, stable. No ureteral stones or hydronephrosis. Urinary bladder is unremarkable. Sigmoid and descending colonic diverticulosis. No active diverticulitis. Stomach and small bowel decompressed, grossly unremarkable. Appendix not identified, but no inflammatory process in the right lower quadrant. Question prior appendectomy. Aorta and iliac vessels are calcified, non aneurysmal. No free fluid, free air or adenopathy. Small bilateral inguinal hernias containing fat. Small periumbilical hernia on the right containing fat. Just inferior to this hernia, there is a soft tissue nodule within the anterior abdominal wall measuring 3.3 x 2.2 cm. This was not present on prior study. No acute bony abnormality or focal bone lesion. IMPRESSION: Left colonic diverticulosis.  No active diverticulitis. Small periumbilical hernia containing fat. This is stable since prior study. Just inferior to the hernia is abnormal soft tissue in the abdominal wall, new since prior study. This is of unknown etiology. Considerations would include an area of infection/phlegmon or soft tissue metastasis if the patient has history of cancer. Recommend clinical correlation. May consider further evaluation with abdominal wall ultrasound to assess for fluid. Emphysematous changes in the lung bases. Electronically  Signed   By: Rolm Baptise M.D.   On: 10/06/2015 20:36   I have personally reviewed and evaluated these images and lab results as part of my medical decision-making.   EKG Interpretation None      MDM   Final diagnoses:  Cellulitis of abdominal wall   82 year old male presents with redness and pain over the umbilical area of his abdomen. Wanted to r/o abscess vs cellulitis. He is afebrile and all vitals are stable and WNL. CBC is remarkable for mild leukocytosis. CMP shows elevated BUN/SCr (21/1.58) which is patient's baseline. CT of abdomen shows soft tissue inflammation but no abscess.  Since patient is afebrile, non-toxic appearing, can d/c home with Bactrim and Keflex. Advised to follow up with patient's surgeon Dr. Donne Hazel in the morning to assure infection resolves. Shared visit with Dr. Gilford Raid. Patient is NAD, non-toxic, with stable VS. Patient is informed of clinical course, understands medical decision making process, and agrees with plan. Opportunity for questions provided and all questions answered. Return precautions given.      Recardo Evangelist, PA-C 10/07/15 [place], PA-C 10/07/15 1249

## 2015-10-06 NOTE — Discharge Instructions (Signed)

## 2015-10-08 DIAGNOSIS — L0291 Cutaneous abscess, unspecified: Secondary | ICD-10-CM | POA: Diagnosis not present

## 2015-10-21 DIAGNOSIS — L0291 Cutaneous abscess, unspecified: Secondary | ICD-10-CM | POA: Diagnosis not present

## 2015-10-27 ENCOUNTER — Other Ambulatory Visit (HOSPITAL_COMMUNITY): Payer: Medicare Other

## 2015-10-28 ENCOUNTER — Ambulatory Visit (HOSPITAL_COMMUNITY): Payer: Medicare Other | Attending: Cardiovascular Disease

## 2015-10-28 ENCOUNTER — Other Ambulatory Visit: Payer: Self-pay

## 2015-10-28 DIAGNOSIS — G4733 Obstructive sleep apnea (adult) (pediatric): Secondary | ICD-10-CM | POA: Diagnosis not present

## 2015-10-28 DIAGNOSIS — E119 Type 2 diabetes mellitus without complications: Secondary | ICD-10-CM | POA: Insufficient documentation

## 2015-10-28 DIAGNOSIS — Z87891 Personal history of nicotine dependence: Secondary | ICD-10-CM | POA: Diagnosis not present

## 2015-10-28 DIAGNOSIS — Z8249 Family history of ischemic heart disease and other diseases of the circulatory system: Secondary | ICD-10-CM | POA: Diagnosis not present

## 2015-10-28 DIAGNOSIS — I5032 Chronic diastolic (congestive) heart failure: Secondary | ICD-10-CM | POA: Diagnosis not present

## 2015-10-28 DIAGNOSIS — I493 Ventricular premature depolarization: Secondary | ICD-10-CM

## 2015-10-28 DIAGNOSIS — I110 Hypertensive heart disease with heart failure: Secondary | ICD-10-CM | POA: Diagnosis not present

## 2015-10-28 DIAGNOSIS — E785 Hyperlipidemia, unspecified: Secondary | ICD-10-CM | POA: Diagnosis not present

## 2015-10-28 DIAGNOSIS — R29898 Other symptoms and signs involving the musculoskeletal system: Secondary | ICD-10-CM | POA: Diagnosis not present

## 2015-10-31 ENCOUNTER — Other Ambulatory Visit: Payer: Self-pay | Admitting: Internal Medicine

## 2015-11-04 ENCOUNTER — Encounter: Payer: Self-pay | Admitting: Internal Medicine

## 2015-11-04 ENCOUNTER — Other Ambulatory Visit: Payer: Self-pay

## 2015-11-04 ENCOUNTER — Ambulatory Visit (INDEPENDENT_AMBULATORY_CARE_PROVIDER_SITE_OTHER): Payer: Medicare Other | Admitting: Internal Medicine

## 2015-11-04 VITALS — BP 114/76 | HR 58 | Ht 68.0 in | Wt 171.8 lb

## 2015-11-04 DIAGNOSIS — I2510 Atherosclerotic heart disease of native coronary artery without angina pectoris: Secondary | ICD-10-CM

## 2015-11-04 DIAGNOSIS — I493 Ventricular premature depolarization: Secondary | ICD-10-CM | POA: Diagnosis not present

## 2015-11-04 NOTE — Patient Instructions (Signed)
Medication Instructions:  Your physician recommends that you continue on your current medications as directed. Please refer to the Current Medication list given to you today.  Labwork: None ordered.  Testing/Procedures: None ordered.  Follow-Up: Your physician recommends that you schedule a follow-up appointment as needed.   Any Other Special Instructions Will Be Listed Below (If Applicable).     If you need a refill on your cardiac medications before your next appointment, please call your pharmacy.   

## 2015-11-04 NOTE — Progress Notes (Signed)
HPI Jon White is referred today for evaluation of dense PVC's. He is a pleasant 82 yo man with known CAD, PVC's and COPD. He sustained a NSTEMI several weeks ago. He has also had GI bleeding in the past with hemoptysis and hematemesis. The patient was noted on routine monitoring to have very dense ventricular ectopy on a 48 hour holter with over 30000 PVC's in a 24 hour period. Review of the electrograms revealed multiple PVC morphologies. We discussed treatment options and he elected to undergo watchful waiting rather than take amio or mexitil. He has not had syncope. In the interim, he has undergone a repeat 2D echo which shows that his LV function is preserved.  Allergies  Allergen Reactions  . Mometasone Furoate Other (See Comments)    Nasonex: CAUSED RED,ITCHY EYES; "lost my eyelashes"   . Naproxen Sodium Nausea And Vomiting  . Nsaids Other (See Comments)    Stomach ulcers  . Anoro Ellipta [Umeclidinium-Vilanterol] Other (See Comments)    Double vision  . Aspirin Hives and Nausea And Vomiting     Chills, sweats  . Breo Ellipta [Fluticasone Furoate-Vilanterol] Hives  . Dexilant [Dexlansoprazole] Diarrhea    Cramps   . Omnaris [Ciclesonide] Other (See Comments)    Nose bleed   . Spiriva Handihaler [Tiotropium Bromide Monohydrate] Other (See Comments)    Hiccups      Current Outpatient Prescriptions  Medication Sig Dispense Refill  . Aclidinium Bromide (TUDORZA PRESSAIR) 400 MCG/ACT AEPB Inhale 1 puff into the lungs 2 (two) times daily. 1 each 5  . albuterol (VENTOLIN HFA) 108 (90 BASE) MCG/ACT inhaler Inhale 2 puffs into the lungs every 6 (six) hours as needed for wheezing or shortness of breath. 1 Inhaler 2  . apixaban (ELIQUIS) 5 MG TABS tablet Take 1 tablet (5 mg total) by mouth 2 (two) times daily. 60 tablet 5  . atorvastatin (LIPITOR) 10 MG tablet Take 10 mg by mouth daily at 6 PM.     . esomeprazole (NEXIUM) 40 MG capsule Take 1 capsule (40 mg total) by mouth daily.  90 capsule 6  . fluocinonide cream (LIDEX) [id number] % Apply 1 application topically daily as needed (rash). To infected area.    . fluticasone (FLONASE) 50 MCG/ACT nasal spray Place 2 sprays into the nose at bedtime. Each nostril once a day.    . furosemide (LASIX) 40 MG tablet Take 1 tablet (40 mg total) by mouth daily. 90 tablet 3  . metoprolol tartrate (LOPRESSOR) 25 MG tablet Take 0.5 tablets (12.5 mg total) by mouth 2 (two) times daily. 90 tablet 3  . nitroGLYCERIN (NITROSTAT) 0.4 MG SL tablet PLACE 1 TABLET UNDER THE TONGUE EVERY 5 MINUTES AS NEEDED FOR CHEST PAIN 450 tablet 6  . phenol (CHLORASEPTIC) 1.4 % LIQD Use as directed 1 spray in the mouth or throat as needed for throat irritation / pain.    . SYMBICORT 160-4.5 MCG/ACT inhaler INHALE 2 PUFFS BY MOUTH TWICE DAILY 10.2 g 5  . tamsulosin (FLOMAX) 0.4 MG CAPS capsule Take 0.4 mg by mouth daily.     No current facility-administered medications for this visit.     Past Medical History  Diagnosis Date  . Emphysema   . Allergic rhinitis   . Asthma   . COPD (chronic obstructive pulmonary disease) ([place])   . Recurrent upper respiratory infection (URI)     coughing from chronic sinusitis   . Normal cardiac stress test     reports stress  test was several yrs. ago /w [employer]. Dr. Rogelia Mire, WNL   . OSA (obstructive sleep apnea) 2007    CPAP- report of settings on paper chart, seen by [facility] - seen 07/2011   . Renal stone     some passed spontaneous, & also had cystoscopy  . Neuromuscular disorder ([place])     reports shaking, somedays "can't hold spoon" - generalized  shakiness, is going to talk to MD about it at next appt.   . High cholesterol   . Shortness of breath on exertion   . H/O hiatal hernia   . Chronic renal insufficiency, stage III (moderate)   . Mixed hyperlipidemia   . PE (pulmonary embolism) 10/2011  . Bleeding gastric ulcer   . Allergy   . Sleep apnea     wears c-pap  . Duodenum ulcer     with perforation  .  Blood transfusion without reported diagnosis   . Cataract     bilateral removed  . Diabetes mellitus without complication ([place])     no meds- boarderline  . GERD (gastroesophageal reflux disease)   . Myocardial infarction ([place])   . Prostate cancer ([place]) 1992    in remission.  s/p XRT, melanoma under left eye, left temple  . Hypertension   . DVT of axillary vein, acute left 10/24/2014  . Atrial fibrillation ([place])     ROS:   All systems reviewed and negative except as noted in the HPI.   Past Surgical History  Procedure Laterality Date  . Lung surgery  1986    presumed from RLL. Segmentectomy. Benign Nodule.   . Sinus endo w/fusion  sch. 09/15/2011    Procedure: ENDOSCOPIC SINUS SURGERY WITH FUSION NAVIGATION;  Surgeon: Ascencion Dike, MD;  Location: [place];  Service: ENT;  Laterality: Bilateral;  bilateral maxillary antrostomy, bilateral endoscopic ethmoidectomy, bilateral sphenoidectomy, bilateral frontal recess exploration  . Septoplasty  09/15/11  . Cataract extraction w/ intraocular lens  implant, bilateral  ~ 2000  . Ureteroscopy  1980's  . Sinus endo w/fusion  09/15/2011    Procedure: ENDOSCOPIC SINUS SURGERY WITH FUSION NAVIGATION;  Surgeon: Ascencion Dike, MD;  Location: [place];  Service: ENT;  Laterality: Bilateral;  . Maxillary antrostomy  09/15/2011    Procedure: MAXILLARY ANTROSTOMY;  Surgeon: Ascencion Dike, MD;  Location[facility] OR;  Service: ENT;  Laterality: Bilateral;  TOTAL MAXILLARY ANTROSTOMY  . Sinus exploration  09/15/2011    Procedure: SINUS EXPLORATION;  Surgeon: Ascencion Dike, MD;  Location: [facility] OR;  Service: ENT;  Laterality: Bilateral;  FRONTAL RECESS EXPLORATION  . Sphenoidectomy  09/15/2011    Procedure: Coralee Pesa;  Surgeon: Ascencion Dike, MD;  Location: [place];  Service: ENT;  Laterality: Bilateral;  . Septoplasty  09/15/2011    Procedure: SEPTOPLASTY;  Surgeon: Ascencion Dike, MD;  Location: [place];  Service: ENT;  Laterality: Bilateral;  . Laparotomy N/A  08/28/2013    Procedure: EXPLORATORY LAPAROTOMY;  Surgeon: Rolm Bookbinder, MD;  Location: [place];  Service: General;  Laterality: N/A;  . Repair of perforated ulcer N/A 08/28/2013    Procedure: DUODENAL ULCER PATCH;  Surgeon: Rolm Bookbinder, MD;  Location: [place];  Service: General;  Laterality: N/A;  . Gastrostomy N/A 08/28/2013    Procedure: GASTROSTOMY;  Surgeon: Rolm Bookbinder, MD;  Location: [place];  Service: General;  Laterality: N/A;  . Esophagogastroduodenoscopy N/A 08/28/2013    Procedure: ESOPHAGOGASTRODUODENOSCOPY (EGD);  Surgeon: Gayland Curry, MD;  Location: [facility]  OR;  Service: General;  Laterality: N/A;  . Melanoma excision Left     under left eye  . Esophagogastroduodenoscopy N/A 04/03/2014    Procedure: ESOPHAGOGASTRODUODENOSCOPY (EGD);  Surgeon: Inda Castle, MD;  Location: [facility];  Service: Endoscopy;  Laterality: N/A;  . Colonoscopy N/A 04/03/2014    Procedure: COLONOSCOPY;  Surgeon: Inda Castle, MD;  [facility];  Service: Endoscopy;  Laterality: N/A;  . Lobectomy Right 1986    1/3 removed      Family History  Problem Relation Age of Onset  . Prostate cancer Brother   . Prostate cancer Brother   . Prostate cancer Brother   . Asthma Father   . Heart disease Mother     MI age 69  . Heart attack Mother   . Anesthesia problems Neg Hx   . Colon cancer Neg Hx   . Esophageal cancer Neg Hx   . Rectal cancer Neg Hx   . Stomach cancer Neg Hx   . Stroke Sister      Social History   Social History  . Marital Status: Married    Spouse Name: N/A  . Number of Children: 4  . Years of Education: N/A   Occupational History  . 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     Social History Main Topics  . Smoking status: Former Smoker -- 1.00 packs/day for 60 years    Types: Cigarettes    Quit date: 08/06/2011  . Smokeless tobacco: Never Used  . Alcohol Use: 0.0 oz/week    0 Standard drinks or equivalent per week     Comment: rarely  . Drug Use: No  . Sexual  Activity: No   Other Topics Concern  . Not on file   Social History Narrative   Traveled to [place].   Married and lives with wife.   Originally from [place].   Spends winters in [place]   Has 2 kids in [place]           BP 114/76 mmHg  Pulse 58  Ht 5\' 8"  (1.727 m)  Wt 171 lb 12.8 oz (77.928 kg)  BMI 26.13 kg/m2  Physical Exam:  Well appearing 82 yo man, NAD HEENT: Unremarkable Neck:  6 cm JVD, no thyromegally Lymphatics:  No adenopathy Back:  No CVA tenderness Lungs:  Clear with no wheezes HEART:  IRegular rate rhythm, no murmurs, no rubs, no clicks Abd:  soft, positive bowel sounds, no organomegally, no rebound, no guarding Ext:  2 plus pulses, no edema, no cyanosis, no clubbing Skin:  No rashes no nodules Neuro:  CN II through XII intact, motor grossly intact  Echo - reviewed - preserved LV function  Assess/Plan: 1. PVC's - at this point as he is essentially asymptomatic and has not had any reduction in his heart function, I would suggest he undergo watchful waiting and continue his beta blocker. I will see him back as needed. 2. HTN -his blood pressure is well controlled. No change in meds. 3. CAD - he denies anginal symptoms.  Jon White.D.

## 2015-11-10 DIAGNOSIS — L0291 Cutaneous abscess, unspecified: Secondary | ICD-10-CM | POA: Diagnosis not present

## 2015-11-11 ENCOUNTER — Telehealth: Payer: Self-pay | Admitting: Cardiovascular Disease

## 2015-11-11 NOTE — Telephone Encounter (Signed)
Spoke with pt. He reports office visit with Dr. Lovena Le was fine and he would like to wait until October to see Dr. Angelena Form.  I told pt this would be OK.

## 2015-11-11 NOTE — Telephone Encounter (Signed)
New Message  Pt calling to speak w/ RN- would not specify nature of call. Please call back and discuss.

## 2015-11-13 ENCOUNTER — Telehealth: Payer: Self-pay | Admitting: Internal Medicine

## 2015-11-13 NOTE — Telephone Encounter (Signed)
Spoke with pt and wife, states that his PA for eliquis will run out late next month and they want to know what to do to get that approved.  I advised pt that we cannot do a PA for a medication that far out, but pt will call office closer to time and we can initiate PA.  Pt and wife expressed understanding.  Nothing further needed at this time.

## 2015-11-18 ENCOUNTER — Telehealth: Payer: Self-pay | Admitting: Internal Medicine

## 2015-11-18 MED ORDER — BUDESONIDE-FORMOTEROL FUMARATE 160-4.5 MCG/ACT IN AERO: 2.0000 | INHALATION_SPRAY | Freq: Two times a day (BID) | RESPIRATORY_TRACT | Status: DC

## 2015-11-18 NOTE — Telephone Encounter (Signed)
Spoke with pt, advised we do not have eliquis samples at this time.  Pt asked then for symbicort samples.  These have been left up front for pickup.  Nothing further needed.

## 2015-11-26 ENCOUNTER — Telehealth: Payer: Self-pay | Admitting: Internal Medicine

## 2015-11-26 NOTE — Telephone Encounter (Signed)
[id number] x1 

## 2015-11-26 NOTE — Telephone Encounter (Signed)
Called [facility] and initiated PA for Eliquis 5mg  Spoke with Jeral Fruit - Eliquis has been approved through 05/30/2016 Reference # [id number]  Pt aware that this medication has been approved and he will be able to refill when its time. Nothing further needed.

## 2015-11-26 NOTE — Telephone Encounter (Signed)
Needing samples of Eliquis 5mg  and Symbicort- no samples in office of Eliquis; Symbicort placed up front.   Pt states that his insurance needs re-approval of this medication for coverage this year. Aware that we will work on [place] for [place] 06/18/15 -- [place] does require PA [employer] [phone] Member ID# [id number] ----- Called [employer], spoke with Deliah Goody 5mg  tabs approved through insurance until 12/16/2015 under medicare Part D.  PA will have to be completed again in July 2017.   PA to be initiated.

## 2015-12-09 ENCOUNTER — Telehealth: Payer: Self-pay | Admitting: Internal Medicine

## 2015-12-09 MED ORDER — APIXABAN 5 MG PO TABS: 5.0000 mg | ORAL_TABLET | Freq: Two times a day (BID) | ORAL | Status: DC

## 2015-12-09 NOTE — Telephone Encounter (Signed)
Spoke with pt, requesting refills on eliquis be sent to below listed pharmacy in [place] as he is there for the next 4 months.  rx called in to pharmacy.  Nothing further needed.

## 2015-12-17 NOTE — ED Provider Notes (Signed)
Medical screening examination/treatment/procedure(s) were conducted as a shared visit with non-physician practitioner(s) and myself.  I personally evaluated the patient during the encounter.  Agree with plan.   EKG Interpretation None       Isla Pence, MD 12/17/15 1002

## 2016-01-05 DIAGNOSIS — H04123 Dry eye syndrome of bilateral lacrimal glands: Secondary | ICD-10-CM | POA: Diagnosis not present

## 2016-01-05 DIAGNOSIS — Z961 Presence of intraocular lens: Secondary | ICD-10-CM | POA: Diagnosis not present

## 2016-01-05 DIAGNOSIS — H353122 Nonexudative age-related macular degeneration, left eye, intermediate dry stage: Secondary | ICD-10-CM | POA: Diagnosis not present

## 2016-01-05 DIAGNOSIS — H26492 Other secondary cataract, left eye: Secondary | ICD-10-CM | POA: Diagnosis not present

## 2016-02-04 DIAGNOSIS — Z23 Encounter for immunization: Secondary | ICD-10-CM | POA: Diagnosis not present

## 2016-03-09 ENCOUNTER — Other Ambulatory Visit: Payer: Self-pay | Admitting: Cardiovascular Disease

## 2016-03-17 ENCOUNTER — Other Ambulatory Visit: Payer: Self-pay | Admitting: Cardiovascular Disease

## 2016-03-23 DIAGNOSIS — I214 Non-ST elevation (NSTEMI) myocardial infarction: Secondary | ICD-10-CM | POA: Diagnosis not present

## 2016-03-23 DIAGNOSIS — E782 Mixed hyperlipidemia: Secondary | ICD-10-CM | POA: Diagnosis not present

## 2016-03-23 DIAGNOSIS — I2510 Atherosclerotic heart disease of native coronary artery without angina pectoris: Secondary | ICD-10-CM | POA: Diagnosis not present

## 2016-03-24 ENCOUNTER — Ambulatory Visit: Payer: Medicare Other | Admitting: Internal Medicine

## 2016-03-25 ENCOUNTER — Encounter: Payer: Self-pay | Admitting: Internal Medicine

## 2016-03-25 ENCOUNTER — Ambulatory Visit (INDEPENDENT_AMBULATORY_CARE_PROVIDER_SITE_OTHER): Payer: Medicare Other | Admitting: Internal Medicine

## 2016-03-25 VITALS — BP 110/76 | HR 57 | Ht 68.0 in | Wt 172.0 lb

## 2016-03-25 DIAGNOSIS — I2510 Atherosclerotic heart disease of native coronary artery without angina pectoris: Secondary | ICD-10-CM

## 2016-03-25 DIAGNOSIS — J449 Chronic obstructive pulmonary disease, unspecified: Secondary | ICD-10-CM | POA: Diagnosis not present

## 2016-03-25 DIAGNOSIS — I2699 Other pulmonary embolism without acute cor pulmonale: Secondary | ICD-10-CM

## 2016-03-25 DIAGNOSIS — R058 Other specified cough: Secondary | ICD-10-CM

## 2016-03-25 DIAGNOSIS — R05 Cough: Secondary | ICD-10-CM | POA: Diagnosis not present

## 2016-03-25 MED ORDER — DOXYCYCLINE HYCLATE 100 MG PO TABS: ORAL_TABLET | ORAL | 0 refills | Status: DC

## 2016-03-25 NOTE — Progress Notes (Signed)
Subjective:     Patient ID: Jon White, male   DOB: March 06, 1933, 82 y.o.   MRN: [id number]  HPI  OV 07/29/2015  Chief Complaint  Patient presents with  . Follow-up    Pt states his breathing is doing well, no change since last OV. Pt states his SOB is at baseline. Pt c/o prod cough with yellow mucus in morning then changes to white mucus thorughout the day. Pt wearing CPAP nightly.    82 year old male with COPD. Presents for routine follow-up. Last seen November 2016. At that time he had issues with bradycardia and a cut his Lopressor down after talking to cardiology. Since then he has seen cardiology and apparently his diagnosis is PVCs. Review of the records confirm this. He has a follow-up echo pending. His Lopressor is now been increased again. He is not having dizziness anymore. He still has chronic exertional fatigue and dyspnea when he climbs one flight of stairs. This is his baseline but it is a lower baseline and then before he had his GI complications. Off note his insurance will not cover his long-acting muscarinic agent Turoza. He wants an alternative. He has not had a Prevnar vaccine and he will have it today  His new complaint is that he's having chronic purple color discoloration of his lower extremity is particularly the left side associated with edema. There might be some associated claudication. It is of insidious onset. It is persistent. It is of long-standing duration of possibly several months. He wants this attended to.   [place] 03/25/2016  Chief Complaint  Patient presents with  . COPD    Breathing is unchanged since last OV. Reports SOB, wheezing and coughing. Denies chest tightness.    Follow-up moderate COPD based on 2014 PFTs. He is here for regular follow-up. He tells me that his COPD stable although recently in the last month his chronic yellow   is sputum is back. It resolved approximately a year ago when he got antibiotics for his abdominal issues. however his  wheezing is not worse. His dyspnea and exertion is actually better. He is going to start pulmonary rehabilitation. His cough is not any different from baseline. This no fever or chills. He has some neuropathic sensation in his abdominal area with incisions are. Otherwise he's feeling fine. He continues on eiquis forpulmonary embolism which is lifelong. There is no bleeding issues. He is up-to-date with his vaccines     has a past medical history of Allergic rhinitis; Allergy; Asthma; Atrial fibrillation ([place]); Bleeding gastric ulcer; Blood transfusion without reported diagnosis; Cataract; Chronic renal insufficiency, stage III (moderate); COPD (chronic obstructive pulmonary disease) ([place]); Diabetes mellitus without complication ([place]); Duodenum ulcer; DVT of axillary vein, acute left ([place]) (10/24/2014); Emphysema; GERD (gastroesophageal reflux disease); H/O hiatal hernia; High cholesterol; Hypertension; Mixed hyperlipidemia; Myocardial infarction; Neuromuscular disorder ([place]); Normal cardiac stress test; OSA (obstructive sleep apnea) (2007); PE (pulmonary embolism) (10/2011); Prostate cancer ([place]) (1992); Recurrent upper respiratory infection (URI); Renal stone; Shortness of breath on exertion; and Sleep apnea.   reports that he quit smoking about 4 years ago. His smoking use included Cigarettes. He has a 60.00 pack-year smoking history. He has never used smokeless tobacco.  Past Surgical History:  Procedure Laterality Date  . CATARACT EXTRACTION W/ INTRAOCULAR LENS  IMPLANT, BILATERAL  ~ 2000  . COLONOSCOPY N/A 04/03/2014   Procedure: COLONOSCOPY;  Surgeon: Inda Castle, MD;  Location: [facility];  Service: Endoscopy;  Laterality: N/A;  . ESOPHAGOGASTRODUODENOSCOPY N/A 08/28/2013  Procedure: ESOPHAGOGASTRODUODENOSCOPY (EGD);  Surgeon: Gayland Curry, MD;  Location: [facility];  Service: General;  Laterality: N/A;  . ESOPHAGOGASTRODUODENOSCOPY N/A 04/03/2014   Procedure: ESOPHAGOGASTRODUODENOSCOPY (EGD);   Surgeon: Inda Castle, MD;  Location: [facility];  Service: Endoscopy;  Laterality: N/A;  . GASTROSTOMY N/A 08/28/2013   Procedure: GASTROSTOMY;  Surgeon: Rolm Bookbinder, MD;  Location: [place];  Service: General;  Laterality: N/A;  . LAPAROTOMY N/A 08/28/2013   Procedure: EXPLORATORY LAPAROTOMY;  Surgeon: Rolm Bookbinder, MD;  Location: [facility];  Service: General;  Laterality: N/A;  . LOBECTOMY Right 1986   1/3 removed   . LUNG SURGERY  1986   presumed from RLL. Segmentectomy. Benign Nodule.   [name] Kitchen MAXILLARY ANTROSTOMY  09/15/2011   Procedure: MAXILLARY ANTROSTOMY;  Surgeon: Ascencion Dike, MD;  Location: [facility];  Service: ENT;  Laterality: Bilateral;  TOTAL MAXILLARY ANTROSTOMY  . MELANOMA EXCISION Left    under left eye  . REPAIR OF PERFORATED ULCER N/A 08/28/2013   Procedure: DUODENAL ULCER PATCH;  Surgeon: Rolm Bookbinder, MD;  Location: [place];  Service: General;  Laterality: N/A;  . SEPTOPLASTY  09/15/11  . SEPTOPLASTY  09/15/2011   Procedure: SEPTOPLASTY;  Surgeon: Ascencion Dike, MD;  Location: [place];  Service: ENT;  Laterality: Bilateral;  . SINUS ENDO W/FUSION  sch. 09/15/2011   Procedure: ENDOSCOPIC SINUS SURGERY WITH FUSION NAVIGATION;  Surgeon: Ascencion Dike, MD;  Location: [place];  Service: ENT;  Laterality: Bilateral;  bilateral maxillary antrostomy, bilateral endoscopic ethmoidectomy, bilateral sphenoidectomy, bilateral frontal recess exploration  . SINUS ENDO W/FUSION  09/15/2011   Procedure: ENDOSCOPIC SINUS SURGERY WITH FUSION NAVIGATION;  Surgeon: Ascencion Dike, MD;  Location: [place];  Service: ENT;  Laterality: Bilateral;  . SINUS EXPLORATION  09/15/2011   Procedure: SINUS EXPLORATION;  Surgeon: Ascencion Dike, MD;  Location: [place];  Service: ENT;  Laterality: Bilateral;  FRONTAL RECESS EXPLORATION  . SPHENOIDECTOMY  09/15/2011   Procedure: [place];  Surgeon: Ascencion Dike, MD;  Location: [place];  Service: ENT;  Laterality: Bilateral;  . URETEROSCOPY  1980's     Allergies  Allergen Reactions  . Mometasone Furoate Other (See Comments)    Nasonex: CAUSED RED,ITCHY EYES; "lost my eyelashes"   . Naproxen Sodium Nausea And Vomiting  . Nsaids Other (See Comments)    Stomach ulcers  . Anoro Ellipta [Umeclidinium-Vilanterol] Other (See Comments)    Double vision  . Aspirin Hives and Nausea And Vomiting     Chills, sweats  . Breo Ellipta [Fluticasone Furoate-Vilanterol] Hives  . Dexilant [Dexlansoprazole] Diarrhea    Cramps   . Omnaris [Ciclesonide] Other (See Comments)    Nose bleed   . Spiriva Handihaler [Tiotropium Bromide Monohydrate] Other (See Comments)    Hiccups     Immunization History  Administered Date(s) Administered  . Influenza Split 02/26/2013, 02/21/2014, 02/04/2015  . Influenza Whole 03/31/2012  . Influenza,inj,Quad PF,36+ Mos 02/04/2016  . Pneumococcal Polysaccharide-23 08/30/2003, 07/30/2014    Family History  Problem Relation Age of Onset  . Prostate cancer Brother   . Prostate cancer Brother   . Prostate cancer Brother   . Asthma Father   . Heart disease Mother     MI age 66  . Heart attack Mother   . Anesthesia problems Neg Hx   . Colon cancer Neg Hx   . Esophageal cancer Neg Hx   . Rectal cancer Neg Hx   . Stomach cancer Neg Hx   .  Stroke Sister      Current Outpatient Prescriptions:  .  Aclidinium Bromide (TUDORZA PRESSAIR) 400 MCG/ACT AEPB, Inhale 1 puff into the lungs 2 (two) times daily., Disp: 1 each, Rfl: 5 .  albuterol (VENTOLIN HFA) 108 (90 BASE) MCG/ACT inhaler, Inhale 2 puffs into the lungs every 6 (six) hours as needed for wheezing or shortness of breath., Disp: 1 Inhaler, Rfl: 2 .  apixaban (ELIQUIS) 5 MG TABS tablet, Take 1 tablet (5 mg total) by mouth 2 (two) times daily., Disp: 60 tablet, Rfl: 5 .  atorvastatin (LIPITOR) 10 MG tablet, Take 10 mg by mouth daily at 6 PM. , Disp: , Rfl:  .  budesonide-formoterol (SYMBICORT) 160-4.5 MCG/ACT inhaler, Inhale 2 puffs into the lungs 2 (two) times  daily., Disp: 2 Inhaler, Rfl: 0 .  esomeprazole (NEXIUM) 40 MG capsule, Take 1 capsule (40 mg total) by mouth daily., Disp: 90 capsule, Rfl: 6 .  fluocinonide cream (LIDEX) [id number] %, Apply 1 application topically daily as needed (rash). To infected area., Disp: , Rfl:  .  fluticasone (FLONASE) 50 MCG/ACT nasal spray, Place 2 sprays into the nose at bedtime. Each nostril once a day., Disp: , Rfl:  .  furosemide (LASIX) 40 MG tablet, TAKE 1 TABLET(40 MG) BY MOUTH DAILY, Disp: 90 tablet, Rfl: 2 .  metoprolol tartrate (LOPRESSOR) 25 MG tablet, Take 0.5 tablets (12.5 mg total) by mouth 2 (two) times daily., Disp: 90 tablet, Rfl: 3 .  nitroGLYCERIN (NITROSTAT) 0.4 MG SL tablet, PLACE 1 TABLET UNDER THE TONGUE EVERY 5 MINUTES AS NEEDED FOR CHEST PAIN, Disp: 450 tablet, Rfl: 6 .  phenol (CHLORASEPTIC) 1.4 % LIQD, Use as directed 1 spray in the mouth or throat as needed for throat irritation / pain., Disp: , Rfl:  .  tamsulosin (FLOMAX) 0.4 MG CAPS capsule, Take 0.4 mg by mouth daily., Disp: , Rfl:      Review of Systems     Objective:   Physical Exam  Constitutional: He is oriented to person, place, and time. He appears well-developed and well-nourished. No distress.  HENT:  Head: Normocephalic and atraumatic.  Right Ear: External ear normal.  Left Ear: External ear normal.  Mouth/Throat: Oropharynx is clear and moist. No oropharyngeal exudate.  Eyes: Conjunctivae and EOM are normal. Pupils are equal, round, and reactive to light. Right eye exhibits no discharge. Left eye exhibits no discharge. No scleral icterus.  Neck: Normal range of motion. Neck supple. No JVD present. No tracheal deviation present. No thyromegaly present.  Cardiovascular: Normal rate, regular rhythm and intact distal pulses.  Exam reveals no gallop and no friction rub.   No murmur heard. Pulmonary/Chest: Effort normal and breath sounds normal. No respiratory distress. He has no wheezes. He has no rales. He exhibits no  tenderness.  Abdominal: Soft. Bowel sounds are normal. He exhibits no distension and no mass. There is no tenderness. There is no rebound and no guarding.  Visceral obesity + Scar +  Musculoskeletal: Normal range of motion. He exhibits no edema or tenderness.  Lymphadenopathy:    He has no cervical adenopathy.  Neurological: He is alert and oriented to person, place, and time. He has normal reflexes. No cranial nerve deficit. Coordination normal.  Skin: Skin is warm and dry. No rash noted. He is not diaphoretic. No erythema. No pallor.  Psychiatric: He has a normal mood and affect. His behavior is normal. Judgment and thought content normal.  Nursing note and vitals reviewed.   Vitals:   03/25/16  1014  BP: 110/76  Pulse: (!) 57  SpO2: 97%  Weight: 172 lb (78 kg)  Height: 5\' 8"  (1.727 m)    Estimated body mass index is 26.15 kg/m as calculated from the following:   Height as of this encounter: 5\' 8"  (1.727 m).   Weight as of this encounter: 172 lb (78 kg).      Assessment:       ICD-9-CM ICD-10-CM   1. COPD, moderate ([place]) 496 J44.9   2. Cough productive of purulent sputum 786.2 R05   3. Other pulmonary embolism without acute cor pulmonale, unspecified chronicity (HCC) 415.19 I26.99        Plan:      Copd stable. Continue symbicort and tudorza as before scheduled with albtuerol as needed Glad uptodate with flu shot Glad starting rehab Take doxycycline 100mg  po twice daily x 5 days; take after meals and avoid sunlight   - see if sputum clears up with this . If not might be a new baseline issue  Glad no bleeding complications on eliquis Continue life long eqliuis  followup  6 months or sooner if needed     Dr. Brand Males, M.D., [facility]C.P Pulmonary and Critical Care Medicine Staff Physician [place] [facility] Pager: [phone], If no answer or between  15:00h - 7:00h: call [phone]  03/25/2016 10:38 AM

## 2016-03-25 NOTE — Patient Instructions (Addendum)
ICD-9-CM ICD-10-CM   1. COPD, moderate ([place]) 496 J44.9   2. Cough productive of purulent sputum 786.2 R05   3. Other pulmonary embolism without acute cor pulmonale, unspecified chronicity (HCC) 415.19 I26.99     Copd stable. Continue symbicort and tudorza as before scheduled with albtuerol as needed Glad uptodate with flu shot Glad starting rehab Take doxycycline 100mg  po twice daily x 5 days; take after meals and avoid sunlight   - see if sputum clears up with this . If not might be a new baseline issue  Glad no bleeding complications on eliquis Continue life long eqliuis  followup  6 months or sooner if needed

## 2016-03-30 DIAGNOSIS — R7301 Impaired fasting glucose: Secondary | ICD-10-CM | POA: Diagnosis not present

## 2016-03-30 DIAGNOSIS — E782 Mixed hyperlipidemia: Secondary | ICD-10-CM | POA: Diagnosis not present

## 2016-03-30 DIAGNOSIS — I2510 Atherosclerotic heart disease of native coronary artery without angina pectoris: Secondary | ICD-10-CM | POA: Diagnosis not present

## 2016-03-30 DIAGNOSIS — I214 Non-ST elevation (NSTEMI) myocardial infarction: Secondary | ICD-10-CM | POA: Diagnosis not present

## 2016-03-31 ENCOUNTER — Telehealth: Payer: Self-pay | Admitting: Internal Medicine

## 2016-03-31 DIAGNOSIS — J449 Chronic obstructive pulmonary disease, unspecified: Secondary | ICD-10-CM

## 2016-03-31 NOTE — Telephone Encounter (Signed)
Spoke with Diane at [facility] is now ready for rehab and they need new order placed in EPIC and signed ASAP by MR. Pt is due to come in  Thursday 04-01-16 at 10:00am.    Order placed and will forward to MR to sign order as well. Thanks.

## 2016-04-01 ENCOUNTER — Encounter: Payer: Self-pay | Admitting: Pulmonary Disease

## 2016-04-01 ENCOUNTER — Encounter (HOSPITAL_COMMUNITY)
Admission: RE | Admit: 2016-04-01 | Discharge: 2016-04-01 | Disposition: A | Discharge status: Home / Self Care | Payer: Medicare Other | Source: Ambulatory Visit | Attending: Internal Medicine | Admitting: Internal Medicine

## 2016-04-01 ENCOUNTER — Ambulatory Visit: Payer: Self-pay | Admitting: Pulmonary Disease

## 2016-04-01 VITALS — BP 94/62 | HR 68 | Ht 68.0 in | Wt 170.2 lb

## 2016-04-01 DIAGNOSIS — J449 Chronic obstructive pulmonary disease, unspecified: Secondary | ICD-10-CM | POA: Diagnosis not present

## 2016-04-01 NOTE — Progress Notes (Signed)
Pulmonary Individual Treatment Plan  Patient Details  Name: Jon White MRN: [id number] Date of Birth: 1933/05/24 Referring Provider:   Flowsheet Row PULMONARY REHAB COPD ORIENTATION from 04/01/2016 in [facility]  Referring Provider  Dr. Chase Caller      Initial Encounter Date:  Flowsheet Row PULMONARY REHAB COPD ORIENTATION from 04/01/2016 in [facility]  Date  04/01/16  Referring Provider  Dr. Chase Caller      Visit Diagnosis: Stage 2 moderate COPD by GOLD classification ([place])  Patient's Home Medications on Admission:   Current Outpatient Prescriptions:  .  Aclidinium Bromide (TUDORZA PRESSAIR) 400 MCG/ACT AEPB, Inhale 1 puff into the lungs 2 (two) times daily., Disp: 1 each, Rfl: 5 .  albuterol (VENTOLIN HFA) 108 (90 BASE) MCG/ACT inhaler, Inhale 2 puffs into the lungs every 6 (six) hours as needed for wheezing or shortness of breath., Disp: 1 Inhaler, Rfl: 2 .  apixaban (ELIQUIS) 5 MG TABS tablet, Take 1 tablet (5 mg total) by mouth 2 (two) times daily., Disp: 60 tablet, Rfl: 5 .  atorvastatin (LIPITOR) 10 MG tablet, Take 10 mg by mouth daily at 6 PM. , Disp: , Rfl:  .  budesonide-formoterol (SYMBICORT) 160-4.5 MCG/ACT inhaler, Inhale 2 puffs into the lungs 2 (two) times daily., Disp: 2 Inhaler, Rfl: 0 .  doxycycline (VIBRA-TABS) 100 MG tablet, Take 1 tablet, twice daily for 5 days. Avoid sunlight and take after meals., Disp: 10 tablet, Rfl: 0 .  esomeprazole (NEXIUM) 40 MG capsule, Take 1 capsule (40 mg total) by mouth daily., Disp: 90 capsule, Rfl: 6 .  fluocinonide cream (LIDEX) [id number] %, Apply 1 application topically daily as needed (rash). To infected area., Disp: , Rfl:  .  fluticasone (FLONASE) 50 MCG/ACT nasal spray, Place 2 sprays into the nose at bedtime. Each nostril once a day., Disp: , Rfl:  .  furosemide (LASIX) 40 MG tablet, TAKE 1 TABLET(40 MG) BY MOUTH DAILY, Disp: 90 tablet, Rfl: 2 .  metoprolol tartrate (LOPRESSOR) 25 MG  tablet, Take 0.5 tablets (12.5 mg total) by mouth 2 (two) times daily., Disp: 90 tablet, Rfl: 3 .  nitroGLYCERIN (NITROSTAT) 0.4 MG SL tablet, PLACE 1 TABLET UNDER THE TONGUE EVERY 5 MINUTES AS NEEDED FOR CHEST PAIN, Disp: 450 tablet, Rfl: 6 .  phenol (CHLORASEPTIC) 1.4 % LIQD, Use as directed 1 spray in the mouth or throat as needed for throat irritation / pain., Disp: , Rfl:  .  tamsulosin (FLOMAX) 0.4 MG CAPS capsule, Take 0.4 mg by mouth daily., Disp: , Rfl:   Past Medical History: Past Medical History:  Diagnosis Date  . Allergic rhinitis   . Allergy   . Asthma   . Atrial fibrillation ([place])   . Bleeding gastric ulcer   . Blood transfusion without reported diagnosis   . Cataract    bilateral removed  . Chronic renal insufficiency, stage III (moderate)   . COPD (chronic obstructive pulmonary disease) ([place])   . Diabetes mellitus without complication ([place])    no meds- boarderline  . Duodenum ulcer    with perforation  . DVT of axillary vein, acute left ([place]) 10/24/2014  . Emphysema   . GERD (gastroesophageal reflux disease)   . H/O hiatal hernia   . High cholesterol   . Hypertension   . Mixed hyperlipidemia   . Myocardial infarction   . Neuromuscular disorder ([place])    reports shaking, somedays "can't hold spoon" - generalized  shakiness, is going to talk to MD about it  at next appt.   . Normal cardiac stress test    reports stress test was several yrs. ago /w [employer]. Dr. Rogelia Mire, WNL   . OSA (obstructive sleep apnea) 2007   CPAP- report of settings on paper chart, seen by [facility] - seen 07/2011   . PE (pulmonary embolism) 10/2011  . Prostate cancer ([place]) 1992   in remission.  s/p XRT, melanoma under left eye, left temple  . Recurrent upper respiratory infection (URI)    coughing from chronic sinusitis   . Renal stone    some passed spontaneous, & also had cystoscopy  . Shortness of breath on exertion   . Sleep apnea    wears c-pap    Tobacco Use: History  Smoking  Status  . Former Smoker  . Packs/day: 1.00  . Years: 60.00  . Types: Cigarettes  . Quit date: 08/06/2011  Smokeless Tobacco  . Never Used    Labs: Recent Review Flowsheet Data    Labs for ITP Cardiac and Pulmonary Rehab Latest Ref Rng & Units 08/28/2013 08/31/2013   Trlycerides <150 mg/dL 56 -   Hemoglobin A1c <5.7 % - 6.1(H)   PHART 7.350 - 7.450 7.347(L) -   PCO2ART 35.0 - 45.0 mmHg 46.0(H) -   HCO3 20.0 - 24.0 mEq/L 25.3(H) -   TCO2 0 - 100 mmol/L 27 -   ACIDBASEDEF 0.0 - 2.0 mmol/L 1.0 -   O2SAT % 99.0 -      Capillary Blood Glucose: Lab Results  Component Value Date   GLUCAP 137 (H) 08/30/2013   GLUCAP 111 (H) 08/30/2013   GLUCAP 131 (H) 08/30/2013   GLUCAP 138 (H) 08/30/2013   GLUCAP 136 (H) 08/29/2013     ADL UCSD:     Pulmonary Assessment Scores    Row Name 04/01/16 1118         ADL UCSD   ADL Phase Entry     SOB Score total 40     Rest 1     Walk 6     Stairs 1     Bath 2     Dress 2     Shop 3       CAT Score   CAT Score 22       mMRC Score   mMRC Score 3        Pulmonary Function Assessment:     Pulmonary Function Assessment - 04/01/16 1112      Pulmonary Function Tests   FVC% 70 %   FEV1% 42 %   FEV1/FVC Ratio 39   RV% 140 %   DLCO% 56 %     Initial Spirometry Results   FVC% 70 %   FEV1% 42 %   FEV1/FVC Ratio 39     Post Bronchodilator Spirometry Results   FVC% 25 %   FEV1% 38 %   FEV1/FVC Ratio 44     Breath   Shortness of Breath Yes      Exercise Target Goals: Date: 04/01/16  Exercise Program Goal: Individual exercise prescription set with THRR, safety & activity barriers. Participant demonstrates ability to understand and report RPE using BORG scale, to self-measure pulse accurately, and to acknowledge the importance of the exercise prescription.  Exercise Prescription Goal: Starting with aerobic activity 30 plus minutes a day, 3 days per week for initial exercise prescription. Provide home exercise prescription  and guidelines that participant acknowledges understanding prior to discharge.  Activity Barriers & Risk Stratification:  Activity Barriers & Cardiac Risk Stratification - 04/01/16 1046      Activity Barriers & Cardiac Risk Stratification   Activity Barriers Back Problems   Cardiac Risk Stratification High  Patient has had MI in past 02/08/2014      6 Minute Walk:     6 Minute Walk    Row Name 04/01/16 1150         6 Minute Walk   Phase Initial     Distance 1200 feet     Distance % Change 0 %     Walk Time 6 minutes     # of Rest Breaks 0     MPH 2.27     METS 2.74     RPE 13     Perceived Dyspnea  14     VO2 Peak 7.02     Symptoms No     Resting HR 68 bpm     Resting BP 94/62     Max Ex. HR 83 bpm     Max Ex. BP 120/64     2 Minute Post BP 104/62        Initial Exercise Prescription:     Initial Exercise Prescription - 04/01/16 1100      Date of Initial Exercise RX and Referring Provider   Date 04/01/16   Referring Provider Dr. Chase Caller     Treadmill   MPH 1.2   Grade 0   Minutes 15   METs 1.9     NuStep   Level 2   Watts 12   Minutes 20   METs 1.7     Prescription Details   Frequency (times per week) 2   Duration Progress to 30 minutes of continuous aerobic without signs/symptoms of physical distress     Intensity   THRR REST +  20   THRR 40-80% of Max Heartrate [phone]   Ratings of Perceived Exertion 11-13   Perceived Dyspnea 0-4     Progression   Progression Continue progressive overload as per policy without signs/symptoms or physical distress.     Resistance Training   Training Prescription Yes   Weight 1   Reps 10-12      Perform Capillary Blood Glucose checks as needed.  Exercise Prescription Changes:   Exercise Comments:   Discharge Exercise Prescription (Final Exercise Prescription Changes):   Nutrition:  Target Goals: Understanding of nutrition guidelines, daily intake of sodium 1500mg , cholesterol  200mg , calories 30% from fat and 7% or less from saturated fats, daily to have 5 or more servings of fruits and vegetables.  Biometrics:     Pre Biometrics - 04/01/16 1152      Pre Biometrics   Height 5\' 8"  (1.727 m)   Weight 170 lb 3.1 oz (77.2 kg)   Waist Circumference 41.5 inches   Hip Circumference 38.5 inches   Waist to Hip Ratio 1.08 %   BMI (Calculated) 25.9   Triceps Skinfold 7 mm   % Body Fat 25 %   Grip Strength 90 kg   Flexibility 0 in   Single Leg Stand 5 seconds       Nutrition Therapy Plan and Nutrition Goals:   Nutrition Discharge: Rate Your Plate Scores:     Nutrition Assessments - 04/01/16 1127      Rate Your Plate Scores   Pre Score 40      Psychosocial: Target Goals: Acknowledge presence or absence of depression, maximize coping skills, provide positive support system. Participant is able to  verbalize types and ability to use techniques and skills needed for reducing stress and depression.  Initial Review & Psychosocial Screening:     Initial Psych Review & Screening - 04/01/16 1133      Initial Review   Current issues with --  Not being able to do what he use to do.     Family Dynamics   Good Support System? Yes     Barriers   Psychosocial barriers to participate in program There are no identifiable barriers or psychosocial needs.     Screening Interventions   Interventions Encouraged to exercise      Quality of Life Scores:     Quality of Life - 04/01/16 1153      Quality of Life Scores   Health/Function Pre 17.09 %   Socioeconomic Pre 22.5 %   Psych/Spiritual Pre 22.5 %   Family Pre 24 %   GLOBAL Pre 20.18 %      PHQ-9: Recent Review Flowsheet Data    Depression screen [facility] 2/9 04/01/2016   Decreased Interest 0   Down, Depressed, Hopeless 0   PHQ - 2 Score 0   Altered sleeping 1   Tired, decreased energy 1   Change in appetite 0   Feeling bad or failure about yourself  0   Trouble concentrating 0   Moving slowly  or fidgety/restless 0   Suicidal thoughts 0   PHQ-9 Score 2   Difficult doing work/chores Not difficult at all      Psychosocial Evaluation and Intervention:     Psychosocial Evaluation - 04/01/16 1134      Psychosocial Evaluation & Interventions   Interventions --  No referral needed.    Continued Psychosocial Services Needed No      Psychosocial Re-Evaluation:   Education: Education Goals: Education classes will be provided on a weekly basis, covering required topics. Participant will state understanding/return demonstration of topics presented.  Learning Barriers/Preferences:     Learning Barriers/Preferences - 04/01/16 1048      Learning Barriers/Preferences   Learning Barriers Hearing   Learning Preferences Skilled Demonstration;Individual Instruction;Group Instruction      Education Topics: How Lungs Work and Diseases: - Discuss the anatomy of the lungs and diseases that can affect the lungs, such as COPD.   Exercise: -Discuss the importance of exercise, FITT principles of exercise, normal and abnormal responses to exercise, and how to exercise safely.   Environmental Irritants: -Discuss types of environmental irritants and how to limit exposure to environmental irritants.   Meds/Inhalers and oxygen: - Discuss respiratory medications, definition of an inhaler and oxygen, and the proper way to use an inhaler and oxygen.   Energy Saving Techniques: - Discuss methods to conserve energy and decrease shortness of breath when performing activities of daily living.    Bronchial Hygiene / Breathing Techniques: - Discuss breathing mechanics, pursed-lip breathing technique,  proper posture, effective ways to clear airways, and other functional breathing techniques   Cleaning Equipment: - Provides group verbal and written instruction about the health risks of elevated stress, cause of high stress, and healthy ways to reduce stress.   Nutrition I: Fats: -  Discuss the types of cholesterol, what cholesterol does to the body, and how cholesterol levels can be controlled.   Nutrition II: Labels: -Discuss the different components of food labels and how to read food labels.   Respiratory Infections: - Discuss the signs and symptoms of respiratory infections, ways to prevent respiratory infections, and the importance of seeking  medical treatment when having a respiratory infection.   Stress I: Signs and Symptoms: - Discuss the causes of stress, how stress may lead to anxiety and depression, and ways to limit stress.   Stress II: Relaxation: -Discuss relaxation techniques to limit stress.   Oxygen for Home/Travel: - Discuss how to prepare for travel when on oxygen and proper ways to transport and store oxygen to ensure safety.   Knowledge Questionnaire Score:     Knowledge Questionnaire Score - 04/01/16 1111      Knowledge Questionnaire Score   Pre Score 9/14      Core Components/Risk Factors/Patient Goals at Admission:     Personal Goals and Risk Factors at Admission - 04/01/16 1129      Core Components/Risk Factors/Patient Goals on Admission    Weight Management Obesity   Increase Strength and Stamina Yes   Intervention Provide advice, education, support and counseling about physical activity/exercise needs.;Develop an individualized exercise prescription for aerobic and resistive training based on initial evaluation findings, risk stratification, comorbidities and participant's personal goals.   Expected Outcomes Achievement of increased cardiorespiratory fitness and enhanced flexibility, muscular endurance and strength shown through measurements of functional capacity and personal statement of participant.   Improve shortness of breath with ADL's Yes   Intervention Provide education, individualized exercise plan and daily activity instruction to help decrease symptoms of SOB with activities of daily living.   Expected Outcomes  Short Term: Achieves a reduction of symptoms when performing activities of daily living.   Develop more efficient breathing techniques such as purse lipped breathing and diaphragmatic breathing; and practicing self-pacing with activity Yes   Intervention Provide education, demonstration and support about specific breathing techniuqes utilized for more efficient breathing. Include techniques such as pursed lipped breathing, diaphragmatic breathing and self-pacing activity.   Expected Outcomes Short Term: Participant will be able to demonstrate and use breathing techniques as needed throughout daily activities.   Personal Goal Other Yes   Personal Goal To be able to do what he use to do without SOB. Walk outdoors more, and be more active in general.    Intervention Attend PR 2 x week and supplement his exercise 3 x week at home.    Expected Outcomes Reach personal goals.       Core Components/Risk Factors/Patient Goals Review:      Goals and Risk Factor Review    Row Name 04/01/16 1132             Core Components/Risk Factors/Patient Goals Review   Personal Goals Review Weight Management/Obesity;Increase Strength and Stamina;Improve shortness of breath with ADL's;Develop more efficient breathing techniques such as purse lipped breathing and diaphragmatic breathing and practicing self-pacing with activity.          Core Components/Risk Factors/Patient Goals at Discharge (Final Review):      Goals and Risk Factor Review - 04/01/16 1132      Core Components/Risk Factors/Patient Goals Review   Personal Goals Review Weight Management/Obesity;Increase Strength and Stamina;Improve shortness of breath with ADL's;Develop more efficient breathing techniques such as purse lipped breathing and diaphragmatic breathing and practicing self-pacing with activity.      ITP Comments:     ITP Comments    Row Name 04/01/16 1120           ITP Comments Mr. Dehaven is an 82 yr old gentleman that  was here for his initial visit. He attended the basic heart healthy group nutrition class today 04/01/16 with dietician. He went over principles  of rating your plate and how to plan and eat a heart healthy meal. The importance of keeping your weight down, how to choose healthy meals while dining out.           Comments: Patient arrived for 1st visit/orientation/education at 0800. Patient was referred to PR by Dr. Chase Caller due to COPD (J44.9). During orientation advised patient on arrival and appointment times what to wear, what to do before, during and after exercise. Reviewed attendance and class policy. Talked about inclement weather and class consultation policy. Patient has meet with our Pulmonary Medical Director and was released by Dr. Sinda Du to attend our program. Pt is scheduled to return to Pulmonary Rehab on 04/08/16 at 13:30. Pt was advised to come to class 15 minutes before class starts. Patient has meet with the dietician and was scheduled to attend the Family Structure classes. Pt is eager to get started. Patient participated in warm-up stretches including weights and resistance bands. Patient was able to complete 6 minute walk test. Patient c/o hip pain during walk 3/10. Hip pain subsided after 2 minute rest 0/10. Patient was measured for the equipment. Discussed equipment safety with patient. Took patient pre-anthropometric measurements. Patient finished visit at 1045.

## 2016-04-01 NOTE — H&P (Signed)
Medical director note:  Subjective: here to interview prior to stating pulmonary rehab.He has Gold stage II COPD. He also has cardiac disease and is chronically anticoagulated. He is short of breath after about 50 feet.  He stopped smoking about 12 years ago. No significant cough.   Objective:awake and alert. NAD.HEENT: unremarkable. Heart: atrial fib with well controlled ventricular response. Lungs: clear. Extremeties:no C/C/e.  Assesment: pt with COPD to start rehab. Ok to start exercise.   F/U in 30 days.

## 2016-04-01 NOTE — Progress Notes (Signed)
Cardiac/Pulmonary Rehab Medication Review by a Pharmacist  Does the patient  feel that his/her medications are working for him/her?  no  Has the patient been experiencing any side effects to the medications prescribed?  no  Does the patient measure his/her own blood pressure or blood glucose at home?   He has a monitor at home.  Does not check regularly  Does the patient have any problems obtaining medications due to transportation or finances?   no  Understanding of regimen: excellent Understanding of indications: excellent Potential of compliance: excellent   Pharmacist comments: Mr Fayer is in the "donut hole" right now but he is still able to afford medications.  He has excellent compliance with medications.    Excell Seltzer Poteet 04/01/2016 10:37 AM

## 2016-04-01 NOTE — Telephone Encounter (Signed)
Signed and done  Dr. Brand Males, M.D., [facility].C.P Pulmonary and Critical Care Medicine Staff Physician [place] [facility] Pager: [phone], If no answer or between  15:00h - 7:00h: call [phone]  04/01/2016 8:13 AM

## 2016-04-06 DIAGNOSIS — C61 Malignant neoplasm of prostate: Secondary | ICD-10-CM | POA: Diagnosis not present

## 2016-04-08 ENCOUNTER — Encounter (HOSPITAL_COMMUNITY)
Admission: RE | Admit: 2016-04-08 | Discharge: 2016-04-08 | Disposition: A | Discharge status: Home / Self Care | Payer: Medicare Other | Source: Ambulatory Visit | Attending: Internal Medicine | Admitting: Internal Medicine

## 2016-04-08 DIAGNOSIS — J449 Chronic obstructive pulmonary disease, unspecified: Secondary | ICD-10-CM

## 2016-04-08 NOTE — Progress Notes (Signed)
Daily Session Note  Patient Details  Name: BURTON GAHAN MRN: [id number] Date of Birth: 1933-05-26 Referring Provider:   Flowsheet Row PULMONARY REHAB COPD ORIENTATION from 04/01/2016 in [place]  Referring Provider  Dr. Chase Caller      Encounter Date: 04/08/2016  Check In:     Session Check In - 04/08/16 1330      Check-In   Location [facility]   Staff Present Suzanne Boron, BS, EP, Exercise Physiologist   Supervising physician immediately available to respond to emergencies See telemetry face sheet for immediately available MD   Medication changes reported     No   Fall or balance concerns reported    No   Warm-up and Cool-down Performed as group-led instruction   Resistance Training Performed Yes   VAD Patient? No     Pain Assessment   Currently in Pain? No/denies   Pain Score 0-No pain   Multiple Pain Sites No      Capillary Blood Glucose: No results found for this or any previous visit (from the past 24 hour(s)).   Goals Met:  Independence with exercise equipment Exercise tolerated well No report of cardiac concerns or symptoms Strength training completed today  Goals Unmet:  Not Applicable  Comments: Check out 230   Dr. Kate Sable is Medical Director for [place] and Pulmonary Rehab.

## 2016-04-12 DIAGNOSIS — R350 Frequency of micturition: Secondary | ICD-10-CM | POA: Diagnosis not present

## 2016-04-12 DIAGNOSIS — C61 Malignant neoplasm of prostate: Secondary | ICD-10-CM | POA: Diagnosis not present

## 2016-04-12 DIAGNOSIS — N200 Calculus of kidney: Secondary | ICD-10-CM | POA: Diagnosis not present

## 2016-04-13 ENCOUNTER — Encounter (HOSPITAL_COMMUNITY)
Admission: RE | Admit: 2016-04-13 | Discharge: 2016-04-13 | Disposition: A | Discharge status: Home / Self Care | Payer: Medicare Other | Source: Ambulatory Visit | Attending: Internal Medicine | Admitting: Internal Medicine

## 2016-04-13 DIAGNOSIS — J449 Chronic obstructive pulmonary disease, unspecified: Secondary | ICD-10-CM | POA: Diagnosis not present

## 2016-04-13 NOTE — Progress Notes (Signed)
Daily Session Note  Patient Details  Name: Jon White MRN: [id number] Date of Birth: January 18, 1933 Referring Provider:   Flowsheet Row PULMONARY REHAB COPD ORIENTATION from 04/01/2016 in [place]  Referring Provider  Dr. Chase Caller      Encounter Date: 04/13/2016  Check In:     Session Check In - 04/13/16 1330      Check-In   Location [facility]   Staff Present Suzanne Boron, BS, EP, Exercise Physiologist   Supervising physician immediately available to respond to emergencies See telemetry face sheet for immediately available MD   Medication changes reported     No   Fall or balance concerns reported    No   Warm-up and Cool-down Performed as group-led instruction   Resistance Training Performed Yes   VAD Patient? No     Pain Assessment   Currently in Pain? No/denies   Pain Score 0-No pain   Multiple Pain Sites No      Capillary Blood Glucose: No results found for this or any previous visit (from the past 24 hour(s)).      Exercise Prescription Changes - 04/13/16 1200      Exercise Review   Progression No     Response to Exercise   Blood Pressure (Admit) 108/70   Blood Pressure (Exercise) 132/74   Blood Pressure (Exit) 98/60   Heart Rate (Admit) 66 bpm   Heart Rate (Exercise) 64 bpm   Heart Rate (Exit) 43 bpm   Oxygen Saturation (Admit) 94 %   Oxygen Saturation (Exercise) 92 %   Oxygen Saturation (Exit) 96 %   Rating of Perceived Exertion (Exercise) 13   Perceived Dyspnea (Exercise) 13   Duration Progress to 30 minutes of continuous aerobic without signs/symptoms of physical distress   Intensity Rest + 20     Progression   Progression Continue progressive overload as per policy without signs/symptoms or physical distress.     Resistance Training   Training Prescription Yes   Weight 1   Reps 10-12     Treadmill   MPH 1.2   Grade 0   Minutes 15   METs 1.9     NuStep   Level 2   Watts 11   Minutes 20    METs 1.8     Home Exercise Plan   Plans to continue exercise at Home   Frequency Add 2 additional days to program exercise sessions.     Goals Met:  Independence with exercise equipment Improved SOB with ADL's Using PLB without cueing & demonstrates good technique Exercise tolerated well No report of cardiac concerns or symptoms Strength training completed today  Goals Unmet:  Not Applicable  Comments: Check out 230   Dr. Sinda Du is Medical Director for [facility].

## 2016-04-14 NOTE — Progress Notes (Signed)
Pulmonary Individual Treatment Plan  Patient Details  Name: Jon White MRN: [id number] Date of Birth: 1932/11/16 Referring Provider:   Flowsheet Row PULMONARY REHAB COPD ORIENTATION from 04/01/2016 in [place]  Referring Provider  Dr. Chase Caller      Initial Encounter Date:  Flowsheet Row PULMONARY REHAB COPD ORIENTATION from 04/01/2016 in [place]  Date  04/01/16  Referring Provider  Dr. Chase Caller      Visit Diagnosis: Stage 2 moderate COPD by GOLD classification ([place])  Patient's Home Medications on Admission:   Current Outpatient Prescriptions:  .  Aclidinium Bromide (TUDORZA PRESSAIR) 400 MCG/ACT AEPB, Inhale 1 puff into the lungs 2 (two) times daily., Disp: 1 each, Rfl: 5 .  albuterol (VENTOLIN HFA) 108 (90 BASE) MCG/ACT inhaler, Inhale 2 puffs into the lungs every 6 (six) hours as needed for wheezing or shortness of breath., Disp: 1 Inhaler, Rfl: 2 .  apixaban (ELIQUIS) 5 MG TABS tablet, Take 1 tablet (5 mg total) by mouth 2 (two) times daily., Disp: 60 tablet, Rfl: 5 .  atorvastatin (LIPITOR) 10 MG tablet, Take 10 mg by mouth daily at 6 PM. , Disp: , Rfl:  .  budesonide-formoterol (SYMBICORT) 160-4.5 MCG/ACT inhaler, Inhale 2 puffs into the lungs 2 (two) times daily., Disp: 2 Inhaler, Rfl: 0 .  doxycycline (VIBRA-TABS) 100 MG tablet, Take 1 tablet, twice daily for 5 days. Avoid sunlight and take after meals., Disp: 10 tablet, Rfl: 0 .  esomeprazole (NEXIUM) 40 MG capsule, Take 1 capsule (40 mg total) by mouth daily., Disp: 90 capsule, Rfl: 6 .  fluocinonide cream (LIDEX) [id number] %, Apply 1 application topically daily as needed (rash). To infected area., Disp: , Rfl:  .  fluticasone (FLONASE) 50 MCG/ACT nasal spray, Place 2 sprays into the nose at bedtime. Each nostril once a day., Disp: , Rfl:  .  furosemide (LASIX) 40 MG tablet, TAKE 1 TABLET(40 MG) BY MOUTH DAILY, Disp: 90 tablet, Rfl: 2 .  metoprolol tartrate (LOPRESSOR) 25 MG  tablet, Take 0.5 tablets (12.5 mg total) by mouth 2 (two) times daily., Disp: 90 tablet, Rfl: 3 .  nitroGLYCERIN (NITROSTAT) 0.4 MG SL tablet, PLACE 1 TABLET UNDER THE TONGUE EVERY 5 MINUTES AS NEEDED FOR CHEST PAIN, Disp: 450 tablet, Rfl: 6 .  phenol (CHLORASEPTIC) 1.4 % LIQD, Use as directed 1 spray in the mouth or throat as needed for throat irritation / pain., Disp: , Rfl:  .  tamsulosin (FLOMAX) 0.4 MG CAPS capsule, Take 0.4 mg by mouth daily., Disp: , Rfl:   Past Medical History: Past Medical History:  Diagnosis Date  . Allergic rhinitis   . Allergy   . Asthma   . Atrial fibrillation ([place])   . Bleeding gastric ulcer   . Blood transfusion without reported diagnosis   . Cataract    bilateral removed  . Chronic renal insufficiency, stage III (moderate)   . COPD (chronic obstructive pulmonary disease) ([place])   . Diabetes mellitus without complication ([place])    no meds- boarderline  . Duodenum ulcer    with perforation  . DVT of axillary vein, acute left ([place]) 10/24/2014  . Emphysema   . GERD (gastroesophageal reflux disease)   . H/O hiatal hernia   . High cholesterol   . Hypertension   . Mixed hyperlipidemia   . Myocardial infarction   . Neuromuscular disorder ([place])    reports shaking, somedays "can't hold spoon" - generalized  shakiness, is going to talk to MD about it  at next appt.   . Normal cardiac stress test    reports stress test was several yrs. ago /w [employer]. Dr. Rogelia Mire, WNL   . OSA (obstructive sleep apnea) 2007   CPAP- report of settings on paper chart, seen by [facility] - seen 07/2011   . PE (pulmonary embolism) 10/2011  . Prostate cancer ([place]) 1992   in remission.  s/p XRT, melanoma under left eye, left temple  . Recurrent upper respiratory infection (URI)    coughing from chronic sinusitis   . Renal stone    some passed spontaneous, & also had cystoscopy  . Shortness of breath on exertion   . Sleep apnea    wears c-pap    Tobacco Use: History  Smoking  Status  . Former Smoker  . Packs/day: 1.00  . Years: 60.00  . Types: Cigarettes  . Quit date: 08/06/2011  Smokeless Tobacco  . Never Used    Labs: Recent Review Flowsheet Data    Labs for ITP Cardiac and Pulmonary Rehab Latest Ref Rng & Units 08/28/2013 08/31/2013   Trlycerides <150 mg/dL 56 -   Hemoglobin A1c <5.7 % - 6.1(H)   PHART 7.350 - 7.450 7.347(L) -   PCO2ART 35.0 - 45.0 mmHg 46.0(H) -   HCO3 20.0 - 24.0 mEq/L 25.3(H) -   TCO2 0 - 100 mmol/L 27 -   ACIDBASEDEF 0.0 - 2.0 mmol/L 1.0 -   O2SAT % 99.0 -      Capillary Blood Glucose: Lab Results  Component Value Date   GLUCAP 137 (H) 08/30/2013   GLUCAP 111 (H) 08/30/2013   GLUCAP 131 (H) 08/30/2013   GLUCAP 138 (H) 08/30/2013   GLUCAP 136 (H) 08/29/2013     ADL UCSD:     Pulmonary Assessment Scores    Row Name 04/01/16 1118         ADL UCSD   ADL Phase Entry     SOB Score total 40     Rest 1     Walk 6     Stairs 1     Bath 2     Dress 2     Shop 3       CAT Score   CAT Score 22       mMRC Score   mMRC Score 3        Pulmonary Function Assessment:     Pulmonary Function Assessment - 04/01/16 1112      Pulmonary Function Tests   FVC% 70 %   FEV1% 42 %   FEV1/FVC Ratio 39   RV% 140 %   DLCO% 56 %     Initial Spirometry Results   FVC% 70 %   FEV1% 42 %   FEV1/FVC Ratio 39     Post Bronchodilator Spirometry Results   FVC% 25 %   FEV1% 38 %   FEV1/FVC Ratio 44     Breath   Shortness of Breath Yes      Exercise Target Goals:    Exercise Program Goal: Individual exercise prescription set with THRR, safety & activity barriers. Participant demonstrates ability to understand and report RPE using BORG scale, to self-measure pulse accurately, and to acknowledge the importance of the exercise prescription.  Exercise Prescription Goal: Starting with aerobic activity 30 plus minutes a day, 3 days per week for initial exercise prescription. Provide home exercise prescription and  guidelines that participant acknowledges understanding prior to discharge.  Activity Barriers & Risk Stratification:  Activity Barriers & Cardiac Risk Stratification - 04/01/16 1046      Activity Barriers & Cardiac Risk Stratification   Activity Barriers Back Problems   Cardiac Risk Stratification High  Patient has had MI in past 02/08/2014      6 Minute Walk:     6 Minute Walk    Row Name 04/01/16 1150         6 Minute Walk   Phase Initial     Distance 1200 feet     Distance % Change 0 %     Walk Time 6 minutes     # of Rest Breaks 0     MPH 2.27     METS 2.74     RPE 13     Perceived Dyspnea  14     VO2 Peak 7.02     Symptoms No     Resting HR 68 bpm     Resting BP 94/62     Max Ex. HR 83 bpm     Max Ex. BP 120/64     2 Minute Post BP 104/62        Initial Exercise Prescription:     Initial Exercise Prescription - 04/01/16 1100      Date of Initial Exercise RX and Referring Provider   Date 04/01/16   Referring Provider Dr. Chase Caller     Treadmill   MPH 1.2   Grade 0   Minutes 15   METs 1.9     NuStep   Level 2   Watts 12   Minutes 20   METs 1.7     Prescription Details   Frequency (times per week) 2   Duration Progress to 30 minutes of continuous aerobic without signs/symptoms of physical distress     Intensity   THRR REST +  20   THRR 40-80% of Max Heartrate [phone]   Ratings of Perceived Exertion 11-13   Perceived Dyspnea 0-4     Progression   Progression Continue progressive overload as per policy without signs/symptoms or physical distress.     Resistance Training   Training Prescription Yes   Weight 1   Reps 10-12      Perform Capillary Blood Glucose checks as needed.  Exercise Prescription Changes:      Exercise Prescription Changes    Row Name 04/13/16 1200             Exercise Review   Progression No         Response to Exercise   Blood Pressure (Admit) 108/70       Blood Pressure (Exercise) 132/74        Blood Pressure (Exit) 98/60       Heart Rate (Admit) 66 bpm       Heart Rate (Exercise) 64 bpm       Heart Rate (Exit) 43 bpm       Oxygen Saturation (Admit) 94 %       Oxygen Saturation (Exercise) 92 %       Oxygen Saturation (Exit) 96 %       Rating of Perceived Exertion (Exercise) 13       Perceived Dyspnea (Exercise) 13       Duration Progress to 30 minutes of continuous aerobic without signs/symptoms of physical distress       Intensity Rest + 20         Progression   Progression Continue progressive overload as per policy without signs/symptoms or physical distress.  Resistance Training   Training Prescription Yes       Weight 1       Reps 10-12         Treadmill   MPH 1.2       Grade 0       Minutes 15       METs 1.9         NuStep   Level 2       Watts 11       Minutes 20       METs 1.8         Home Exercise Plan   Plans to continue exercise at Home       Frequency Add 2 additional days to program exercise sessions.          Exercise Comments:      Exercise Comments    Row Name 04/13/16 1256           Exercise Comments Patient has just started pulmonary rehab and will be progressed as needed.           Discharge Exercise Prescription (Final Exercise Prescription Changes):     Exercise Prescription Changes - 04/13/16 1200      Exercise Review   Progression No     Response to Exercise   Blood Pressure (Admit) 108/70   Blood Pressure (Exercise) 132/74   Blood Pressure (Exit) 98/60   Heart Rate (Admit) 66 bpm   Heart Rate (Exercise) 64 bpm   Heart Rate (Exit) 43 bpm   Oxygen Saturation (Admit) 94 %   Oxygen Saturation (Exercise) 92 %   Oxygen Saturation (Exit) 96 %   Rating of Perceived Exertion (Exercise) 13   Perceived Dyspnea (Exercise) 13   Duration Progress to 30 minutes of continuous aerobic without signs/symptoms of physical distress   Intensity Rest + 20     Progression   Progression Continue progressive overload as per  policy without signs/symptoms or physical distress.     Resistance Training   Training Prescription Yes   Weight 1   Reps 10-12     Treadmill   MPH 1.2   Grade 0   Minutes 15   METs 1.9     NuStep   Level 2   Watts 11   Minutes 20   METs 1.8     Home Exercise Plan   Plans to continue exercise at Home   Frequency Add 2 additional days to program exercise sessions.      Nutrition:  Target Goals: Understanding of nutrition guidelines, daily intake of sodium 1500mg , cholesterol 200mg , calories 30% from fat and 7% or less from saturated fats, daily to have 5 or more servings of fruits and vegetables.  Biometrics:     Pre Biometrics - 04/01/16 1152      Pre Biometrics   Height 5\' 8"  (1.727 m)   Weight 170 lb 3.1 oz (77.2 kg)   Waist Circumference 41.5 inches   Hip Circumference 38.5 inches   Waist to Hip Ratio 1.08 %   BMI (Calculated) 25.9   Triceps Skinfold 7 mm   % Body Fat 25 %   Grip Strength 90 kg   Flexibility 0 in   Single Leg Stand 5 seconds       Nutrition Therapy Plan and Nutrition Goals:   Nutrition Discharge: Rate Your Plate Scores:     Nutrition Assessments - 04/01/16 1127      Rate Your Plate Scores   Pre  Score 40      Psychosocial: Target Goals: Acknowledge presence or absence of depression, maximize coping skills, provide positive support system. Participant is able to verbalize types and ability to use techniques and skills needed for reducing stress and depression.  Initial Review & Psychosocial Screening:     Initial Psych Review & Screening - 04/01/16 1133      Initial Review   Current issues with --  Not being able to do what he use to do.     Family Dynamics   Good Support System? Yes     Barriers   Psychosocial barriers to participate in program There are no identifiable barriers or psychosocial needs.     Screening Interventions   Interventions Encouraged to exercise      Quality of Life Scores:     Quality  of Life - 04/01/16 1153      Quality of Life Scores   Health/Function Pre 17.09 %   Socioeconomic Pre 22.5 %   Psych/Spiritual Pre 22.5 %   Family Pre 24 %   GLOBAL Pre 20.18 %      PHQ-9: Recent Review Flowsheet Data    Depression screen [facility] 2/9 04/01/2016   Decreased Interest 0   Down, Depressed, Hopeless 0   PHQ - 2 Score 0   Altered sleeping 1   Tired, decreased energy 1   Change in appetite 0   Feeling bad or failure about yourself  0   Trouble concentrating 0   Moving slowly or fidgety/restless 0   Suicidal thoughts 0   PHQ-9 Score 2   Difficult doing work/chores Not difficult at all      Psychosocial Evaluation and Intervention:     Psychosocial Evaluation - 04/01/16 1134      Psychosocial Evaluation & Interventions   Interventions --  No referral needed.    Continued Psychosocial Services Needed No      Psychosocial Re-Evaluation:   Education: Education Goals: Education classes will be provided on a weekly basis, covering required topics. Participant will state understanding/return demonstration of topics presented.  Learning Barriers/Preferences:     Learning Barriers/Preferences - 04/01/16 1048      Learning Barriers/Preferences   Learning Barriers Hearing   Learning Preferences Skilled Demonstration;Individual Instruction;Group Instruction      Education Topics: How Lungs Work and Diseases: - Discuss the anatomy of the lungs and diseases that can affect the lungs, such as COPD.   Exercise: -Discuss the importance of exercise, FITT principles of exercise, normal and abnormal responses to exercise, and how to exercise safely. Flowsheet Row PULMONARY REHAB CHRONIC OBSTRUCTIVE PULMONARY DISEASE from 04/08/2016 in [place]  Date  04/08/16  Educator  [place]  Instruction Review Code  2- meets goals/outcomes      Environmental Irritants: -Discuss types of environmental irritants and how to limit exposure to environmental  irritants.   Meds/Inhalers and oxygen: - Discuss respiratory medications, definition of an inhaler and oxygen, and the proper way to use an inhaler and oxygen.   Energy Saving Techniques: - Discuss methods to conserve energy and decrease shortness of breath when performing activities of daily living.    Bronchial Hygiene / Breathing Techniques: - Discuss breathing mechanics, pursed-lip breathing technique,  proper posture, effective ways to clear airways, and other functional breathing techniques   Cleaning Equipment: - Provides group verbal and written instruction about the health risks of elevated stress, cause of high stress, and healthy ways to reduce stress.   Nutrition I:  Fats: - Discuss the types of cholesterol, what cholesterol does to the body, and how cholesterol levels can be controlled.   Nutrition II: Labels: -Discuss the different components of food labels and how to read food labels.   Respiratory Infections: - Discuss the signs and symptoms of respiratory infections, ways to prevent respiratory infections, and the importance of seeking medical treatment when having a respiratory infection.   Stress I: Signs and Symptoms: - Discuss the causes of stress, how stress may lead to anxiety and depression, and ways to limit stress.   Stress II: Relaxation: -Discuss relaxation techniques to limit stress.   Oxygen for Home/Travel: - Discuss how to prepare for travel when on oxygen and proper ways to transport and store oxygen to ensure safety.   Knowledge Questionnaire Score:     Knowledge Questionnaire Score - 04/01/16 1111      Knowledge Questionnaire Score   Pre Score 9/14      Core Components/Risk Factors/Patient Goals at Admission:     Personal Goals and Risk Factors at Admission - 04/01/16 1129      Core Components/Risk Factors/Patient Goals on Admission    Weight Management Obesity   Increase Strength and Stamina Yes   Intervention Provide  advice, education, support and counseling about physical activity/exercise needs.;Develop an individualized exercise prescription for aerobic and resistive training based on initial evaluation findings, risk stratification, comorbidities and participant's personal goals.   Expected Outcomes Achievement of increased cardiorespiratory fitness and enhanced flexibility, muscular endurance and strength shown through measurements of functional capacity and personal statement of participant.   Improve shortness of breath with ADL's Yes   Intervention Provide education, individualized exercise plan and daily activity instruction to help decrease symptoms of SOB with activities of daily living.   Expected Outcomes Short Term: Achieves a reduction of symptoms when performing activities of daily living.   Develop more efficient breathing techniques such as purse lipped breathing and diaphragmatic breathing; and practicing self-pacing with activity Yes   Intervention Provide education, demonstration and support about specific breathing techniuqes utilized for more efficient breathing. Include techniques such as pursed lipped breathing, diaphragmatic breathing and self-pacing activity.   Expected Outcomes Short Term: Participant will be able to demonstrate and use breathing techniques as needed throughout daily activities.   Personal Goal Other Yes   Personal Goal To be able to do what he use to do without SOB. Walk outdoors more, and be more active in general.    Intervention Attend PR 2 x week and supplement his exercise 3 x week at home.    Expected Outcomes Reach personal goals.       Core Components/Risk Factors/Patient Goals Review:      Goals and Risk Factor Review    Row Name 04/01/16 1132             Core Components/Risk Factors/Patient Goals Review   Personal Goals Review Weight Management/Obesity;Increase Strength and Stamina;Improve shortness of breath with ADL's;Develop more efficient  breathing techniques such as purse lipped breathing and diaphragmatic breathing and practicing self-pacing with activity.          Core Components/Risk Factors/Patient Goals at Discharge (Final Review):      Goals and Risk Factor Review - 04/01/16 1132      Core Components/Risk Factors/Patient Goals Review   Personal Goals Review Weight Management/Obesity;Increase Strength and Stamina;Improve shortness of breath with ADL's;Develop more efficient breathing techniques such as purse lipped breathing and diaphragmatic breathing and practicing self-pacing with activity.  ITP Comments:     ITP Comments    Row Name 04/01/16 1120 04/14/16 1418         ITP Comments Mr. Hornor is an 82 yr old gentleman that was here for his initial visit. He attended the basic heart healthy group nutrition class today 04/01/16 with dietician. He went over principles of rating your plate and how to plan and eat a heart healthy meal. The importance of keeping your weight down, how to choose healthy meals while dining out.  Patient new to program. Has attended 3 sessions. Will continue to monitor for progress.          Comments: .ITP 30 Day REVIEW Pt is making expected progress toward pulmonary rehab goals after completing 3 sessions. Recommend continued exercise, life style modification, education, and utilization of breathing techniques to increase stamina and strength and decrease shortness of breath with exertion.

## 2016-04-15 ENCOUNTER — Encounter (HOSPITAL_COMMUNITY)
Admission: RE | Admit: 2016-04-15 | Discharge: 2016-04-15 | Disposition: A | Discharge status: Home / Self Care | Payer: Medicare Other | Source: Ambulatory Visit | Attending: Internal Medicine | Admitting: Internal Medicine

## 2016-04-15 DIAGNOSIS — J449 Chronic obstructive pulmonary disease, unspecified: Secondary | ICD-10-CM | POA: Diagnosis not present

## 2016-04-15 NOTE — Progress Notes (Signed)
Daily Session Note  Patient Details  Name: Jon White MRN: [id number] Date of Birth: [birth year] Referring Provider:   Flowsheet Row PULMONARY REHAB COPD ORIENTATION from 04/01/2016 in [place]  Referring Provider  Dr. Chase Caller      Encounter Date: 04/15/2016  Check In:     Session Check In - 04/15/16 1313      Check-In   Location [facility]   Staff Present Diala Waxman Angelina Pih, MS, EP, [facility], Exercise Physiologist;Debra Wynetta Emery, RN, BSN   Supervising physician immediately available to respond to emergencies See telemetry face sheet for immediately available MD   Medication changes reported     No   Warm-up and Cool-down Performed as group-led instruction   Resistance Training Performed Yes   VAD Patient? No     Pain Assessment   Currently in Pain? No/denies   Pain Score 0-No pain   Multiple Pain Sites No      Capillary Blood Glucose: No results found for this or any previous visit (from the past 24 hour(s)).   Goals Met:  Independence with exercise equipment Improved SOB with ADL's Using PLB without cueing & demonstrates good technique Exercise tolerated well No report of cardiac concerns or symptoms Strength training completed today  Goals Unmet:  Not Applicable  Comments: Check out 2:30   Dr. Sinda Du is Medical Director for [facility] Pulmonary Rehab.

## 2016-04-20 ENCOUNTER — Encounter (HOSPITAL_COMMUNITY)
Admission: RE | Admit: 2016-04-20 | Discharge: 2016-04-20 | Disposition: A | Discharge status: Home / Self Care | Payer: Medicare Other | Source: Ambulatory Visit | Attending: Internal Medicine | Admitting: Internal Medicine

## 2016-04-20 DIAGNOSIS — J449 Chronic obstructive pulmonary disease, unspecified: Secondary | ICD-10-CM | POA: Diagnosis not present

## 2016-04-20 NOTE — Progress Notes (Signed)
Daily Session Note  Patient Details  Name: MILON DETHLOFF MRN: [id number] Date of Birth: 01-12-1933 Referring Provider:   Flowsheet Row PULMONARY REHAB COPD ORIENTATION from 04/01/2016 in [place]  Referring Provider  Dr. Chase Caller      Encounter Date: 04/20/2016  Check In:     Session Check In - 04/20/16 1330      Check-In   Location [facility]   Staff Present Suzanne Boron, BS, EP, Exercise Physiologist   Supervising physician immediately available to respond to emergencies See telemetry face sheet for immediately available MD   Medication changes reported     No   Fall or balance concerns reported    No   Warm-up and Cool-down Performed as group-led instruction   Resistance Training Performed Yes   VAD Patient? No     Pain Assessment   Currently in Pain? No/denies   Pain Score 0-No pain   Multiple Pain Sites No      Capillary Blood Glucose: No results found for this or any previous visit (from the past 24 hour(s)).   Goals Met:  Independence with exercise equipment Improved SOB with ADL's Using PLB without cueing & demonstrates good technique Exercise tolerated well No report of cardiac concerns or symptoms Strength training completed today  Goals Unmet:  Not Applicable  Comments: Check out 230   Dr. Sinda Du is Medical Director for [facility] Pulmonary Rehab.

## 2016-04-21 ENCOUNTER — Encounter: Payer: Self-pay | Admitting: *Deleted

## 2016-04-22 ENCOUNTER — Encounter (HOSPITAL_COMMUNITY): Payer: Medicare Other

## 2016-04-27 ENCOUNTER — Encounter (HOSPITAL_COMMUNITY)
Admission: RE | Admit: 2016-04-27 | Discharge: 2016-04-27 | Disposition: A | Discharge status: Home / Self Care | Payer: Medicare Other | Source: Ambulatory Visit | Attending: Internal Medicine | Admitting: Internal Medicine

## 2016-04-27 DIAGNOSIS — J449 Chronic obstructive pulmonary disease, unspecified: Secondary | ICD-10-CM

## 2016-04-27 NOTE — Progress Notes (Signed)
Daily Session Note  Patient Details  Name: DIEGO ULBRICHT MRN: [id number] Date of Birth: 06/21/1932 Referring Provider:   Flowsheet Row PULMONARY REHAB COPD ORIENTATION from 04/01/2016 in [employer]  Referring Provider  Dr. Chase Caller      Encounter Date: 04/27/2016  Check In:     Session Check In - 04/27/16 1330      Check-In   Location [facility]   Staff Present Diane Angelina Pih, MS, EP, [facility], Exercise Physiologist;Towanna Avery Luther Parody, BS, EP, Exercise Physiologist   Supervising physician immediately available to respond to emergencies See telemetry face sheet for immediately available MD   Medication changes reported     No   Fall or balance concerns reported    No   Warm-up and Cool-down Performed as group-led instruction   Resistance Training Performed Yes   VAD Patient? No     Pain Assessment   Currently in Pain? No/denies   Pain Score 0-No pain   Multiple Pain Sites No      Capillary Blood Glucose: No results found for this or any previous visit (from the past 24 hour(s)).   Goals Met:  Independence with exercise equipment Improved SOB with ADL's Using PLB without cueing & demonstrates good technique Exercise tolerated well No report of cardiac concerns or symptoms Strength training completed today  Goals Unmet:  Not Applicable  Comments: Check out 230   Dr. Sinda Du is Medical Director for [facility] Pulmonary Rehab.

## 2016-04-28 NOTE — Progress Notes (Signed)
Jon White 82 y.o. male  30 day Psychosocial Note  Patient psychosocial assessment reveals no barriers to participation in Pulmonary Rehab. Patient does continue to exhibit positive coping skills to deal with his psychosocial concerns. Offered emotional support and reassurance. Patient does feel he is making progress toward Pulmonary Rehab goals. Patient reports his health and activity level has improved in the past 30 days as evidenced by patient's report of increased ability to walk more outside. Patient states family/friends have noticed changes in his activity or mood. Patient reports feeling positive about current and projected progression in Pulmonary Rehab. After reviewing the patient's treatment plan, the patient is making progress toward Pulmonary Rehab goals. Patient's rate of progress toward rehab goals is good. Plan of action to help patient continue to work towards rehab goals include increasing his strength and stamina with improved SOB and to be more active in general and to be able to walk outside more that will work toward improving patient's QOL. Will continue to monitor and evaluate progress toward psychosocial goal(s).  Goal(s) in progress: Improved coping skills Help patient work toward returning to meaningful activities that improve patient's QOL and are attainable with patient's lung disease

## 2016-04-29 ENCOUNTER — Encounter (HOSPITAL_COMMUNITY)
Admission: RE | Admit: 2016-04-29 | Discharge: 2016-04-29 | Disposition: A | Discharge status: Home / Self Care | Payer: Medicare Other | Source: Ambulatory Visit | Attending: Internal Medicine | Admitting: Internal Medicine

## 2016-04-29 DIAGNOSIS — J449 Chronic obstructive pulmonary disease, unspecified: Secondary | ICD-10-CM | POA: Diagnosis not present

## 2016-04-29 NOTE — Progress Notes (Signed)
Daily Session Note  Patient Details  Name: Jon White MRN: [id number] Date of Birth: 01-09-1933 Referring Provider:   Flowsheet Row PULMONARY REHAB COPD ORIENTATION from 04/01/2016 in [place]  Referring Provider  Dr. Chase Caller      Encounter Date: 04/29/2016  Check In:     Session Check In - 04/29/16 0900      Check-In   Location [facility]   Staff Present Suzanne Boron, BS, EP, Exercise Physiologist   Supervising physician immediately available to respond to emergencies See telemetry face sheet for immediately available MD   Medication changes reported     No   Fall or balance concerns reported    No   Warm-up and Cool-down Performed as group-led instruction   Resistance Training Performed Yes   VAD Patient? No     Pain Assessment   Currently in Pain? No/denies   Pain Score 0-No pain   Multiple Pain Sites No      Capillary Blood Glucose: No results found for this or any previous visit (from the past 24 hour(s)).   Goals Met:  Independence with exercise equipment Improved SOB with ADL's Using PLB without cueing & demonstrates good technique Exercise tolerated well No report of cardiac concerns or symptoms Strength training completed today  Goals Unmet:  Not Applicable  Comments: Check out 1000   Dr. Sinda Du is Medical Director for [facility] Pulmonary Rehab.

## 2016-04-29 NOTE — Progress Notes (Signed)
Subjective he is here for reevaluation after 30 days of pulmonary rehabilitation. He says he's feeling okay. He can tell much difference in his breathing but he does feel generally somewhat stronger. He has problems with his left shoulder and that keeps him from doing some of the exercises. He is trying to practice pursed lip breathing. He has not missed any sessions.  Objective: He is awake and alert and in no acute distress. He looks mildly short of breath after walking from the lobby. His lungs are clear. No edema.  Assessment: He is participating in pulmonary rehabilitation and has not missed any sessions. He has improved and he is able to walk more outside. He is progressing well toward his goals of pulmonary rehabilitation  Plan: Continue pulmonary rehabilitation.

## 2016-05-03 ENCOUNTER — Telehealth: Payer: Self-pay | Admitting: Internal Medicine

## 2016-05-03 ENCOUNTER — Encounter: Payer: Self-pay | Admitting: Cardiovascular Disease

## 2016-05-03 ENCOUNTER — Other Ambulatory Visit: Payer: Self-pay | Admitting: Cardiovascular Disease

## 2016-05-03 ENCOUNTER — Ambulatory Visit (INDEPENDENT_AMBULATORY_CARE_PROVIDER_SITE_OTHER): Payer: Medicare Other | Admitting: Cardiovascular Disease

## 2016-05-03 VITALS — BP 118/68 | HR 58 | Ht 68.0 in | Wt 172.2 lb

## 2016-05-03 DIAGNOSIS — I2510 Atherosclerotic heart disease of native coronary artery without angina pectoris: Secondary | ICD-10-CM | POA: Diagnosis not present

## 2016-05-03 DIAGNOSIS — E7800 Pure hypercholesterolemia, unspecified: Secondary | ICD-10-CM

## 2016-05-03 DIAGNOSIS — I5032 Chronic diastolic (congestive) heart failure: Secondary | ICD-10-CM | POA: Diagnosis not present

## 2016-05-03 DIAGNOSIS — I10 Essential (primary) hypertension: Secondary | ICD-10-CM

## 2016-05-03 DIAGNOSIS — I493 Ventricular premature depolarization: Secondary | ICD-10-CM

## 2016-05-03 MED ORDER — NITROGLYCERIN 0.4 MG SL SUBL: 0.4000 mg | SUBLINGUAL_TABLET | SUBLINGUAL | 3 refills | Status: DC | PRN

## 2016-05-03 MED ORDER — APIXABAN 5 MG PO TABS: 5.0000 mg | ORAL_TABLET | Freq: Two times a day (BID) | ORAL | 3 refills | Status: DC

## 2016-05-03 NOTE — Patient Instructions (Signed)
Medication Instructions:  Your physician recommends that you continue on your current medications as directed. Please refer to the Current Medication list given to you today.   Labwork: none  Testing/Procedures: Your physician has requested that you have an echocardiogram. Echocardiography is a painless test that uses sound waves to create images of your heart. It provides your doctor with information about the size and shape of your heart and how well your heart's chambers and valves are working. This procedure takes approximately one hour. There are no restrictions for this procedure. To be done in 6 months--week or so prior to appt with Dr. Angelena Form    Follow-Up: Your physician recommends that you schedule a follow-up appointment in: 6 months.  Please call our office in early March to schedule this appointment.     Any Other Special Instructions Will Be Listed Below (If Applicable).     If you need a refill on your cardiac medications before your next appointment, please call your pharmacy.

## 2016-05-03 NOTE — Progress Notes (Signed)
Chief Complaint  Patient presents with  . Irregular Heart Beat    PVC's    History of Present Illness: 82 yo male with history of PE/DVT on chronic coumadin therapy prior to September 2015, PVCs, NSVT, GI bleeding, COPD, prostate cancer, OSA, HLD, chronic kidney disease who is here today for cardiac follow up. He was seen as a new patient 03/25/14 for cardiac evaluation. He was in [place] September 2015 and had syncopal episode. He felt dizzy before the episode. No palpitations. He was taken to a [facility] where he was diagnosed with non-STEMI. His coumadin was held and he was placed on IV heparin. 2 days later had significant upper GI bleed that required resuscitation with blood and emergent endoscopy which showed multiple bleeding gastric ulcers. Cardiac cath was canceled due to the GI bleeding. He also had runs of non-sustained VT and was placed on amiodarone but this was changed to metoprolol before discharge. He spent 9 days in the hospital and released on 02/15/14. He had no chest pain during the admission or since then. Echo 02/08/14 from [facility] with LVEF=40-45%. I arranged a cardiac cath after his first visit but this was cancelled pending GI workup. EGD 04/03/14 with active 41mm ulcer at the duodenal bulb. Otherwise EGD was normal. Severe sigmoid diverticulosis noted on colonoscopy. Barium enema with diverticulosis but no strictures or extravasation. Of note, ASA causes hives and Ace-inh caused dry cough. He has been seen in f/u by GI and repeat EGD on 10/10/14 showed healed ulcer. He was admitted to [facility] 10/24/14 with dyspnea and found to have recurrent PE. He was started on Eliquis. Echo 10/24/14 with normal LV function, aortic valve sclerosis. He was seen here 11/07/14 and was doing well. He was seen in the the Pulmonary office the following week and his HR was felt to be in the 40s but no EKG was done. I saw him 11/22/14 and EKG showed ventricular bigeminy with overall HR in the 80s. He was  started on a beta blocker and he did well. He was recently seen in the Pulmonary office and was bradycardic. A 48 hour monitor was placed.  48 hour monitor with 72,000 PVCs in the monitoring period. This was read by our office DOD and EP referral made with Dr. Lovena Le who saw him 05/07/16. Plan was to watch for now and repeat echo in 6 months. He was started on Lasix for LE edema prior to his last visit with me.    He is here today for follow up. He rarely notices any palpitations. No chest pain. No change in breathing. He has had LE edema, equal bilaterally. No near syncope or syncope.   Primary Care Physician: Merrilee Seashore, MD  Past Medical History:  Diagnosis Date  . Allergic rhinitis   . Allergy   . Asthma   . Atrial fibrillation ([place])   . Bleeding gastric ulcer   . Blood transfusion without reported diagnosis   . Cataract    bilateral removed  . Chronic renal insufficiency, stage III (moderate)   . COPD (chronic obstructive pulmonary disease) ([place])   . Diabetes mellitus without complication ([place])    no meds- boarderline  . Duodenum ulcer    with perforation  . DVT of axillary vein, acute left ([place]) 10/24/2014  . Emphysema   . GERD (gastroesophageal reflux disease)   . H/O hiatal hernia   . High cholesterol   . Hypertension   . Mixed hyperlipidemia   .  Myocardial infarction   . Neuromuscular disorder ([place])    reports shaking, somedays "can't hold spoon" - generalized  shakiness, is going to talk to MD about it at next appt.   . Normal cardiac stress test    reports stress test was several yrs. ago /w [employer]. Dr. Rogelia Mire, WNL   . OSA (obstructive sleep apnea) 2007   CPAP- report of settings on paper chart, seen by [facility] - seen 07/2011   . PE (pulmonary embolism) 10/2011  . Prostate cancer ([place]) 1992   in remission.  s/p XRT, melanoma under left eye, left temple  . Recurrent upper respiratory infection (URI)    coughing from chronic sinusitis   . Renal stone     some passed spontaneous, & also had cystoscopy  . Shortness of breath on exertion   . Sleep apnea    wears c-pap    Past Surgical History:  Procedure Laterality Date  . CATARACT EXTRACTION W/ INTRAOCULAR LENS  IMPLANT, BILATERAL  ~ 2000  . COLONOSCOPY N/A 04/03/2014   Procedure: COLONOSCOPY;  Surgeon: Inda Castle, MD;  Location: [facility];  Service: Endoscopy;  Laterality: N/A;  . ESOPHAGOGASTRODUODENOSCOPY N/A 08/28/2013   Procedure: ESOPHAGOGASTRODUODENOSCOPY (EGD);  Surgeon: Gayland Curry, MD;  Location: [place];  Service: General;  Laterality: N/A;  . ESOPHAGOGASTRODUODENOSCOPY N/A 04/03/2014   Procedure: ESOPHAGOGASTRODUODENOSCOPY (EGD);  Surgeon: Inda Castle, MD;  Location: [facility];  Service: Endoscopy;  Laterality: N/A;  . GASTROSTOMY N/A 08/28/2013   Procedure: GASTROSTOMY;  Surgeon: Rolm Bookbinder, MD;  Location: [place];  Service: General;  Laterality: N/A;  . LAPAROTOMY N/A 08/28/2013   Procedure: EXPLORATORY LAPAROTOMY;  Surgeon: Rolm Bookbinder, MD;  Location: [place];  Service: General;  Laterality: N/A;  . LOBECTOMY Right 1986   1/3 removed   . LUNG SURGERY  1986   presumed from RLL. Segmentectomy. Benign Nodule.   [name] Kitchen MAXILLARY ANTROSTOMY  09/15/2011   Procedure: MAXILLARY ANTROSTOMY;  Surgeon: Ascencion Dike, MD;  Location: [facility];  Service: ENT;  Laterality: Bilateral;  TOTAL MAXILLARY ANTROSTOMY  . MELANOMA EXCISION Left    under left eye  . REPAIR OF PERFORATED ULCER N/A 08/28/2013   Procedure: DUODENAL ULCER PATCH;  Surgeon: Rolm Bookbinder, MD;  Location: [place];  Service: General;  Laterality: N/A;  . SEPTOPLASTY  09/15/11  . SEPTOPLASTY  09/15/2011   Procedure: SEPTOPLASTY;  Surgeon: Ascencion Dike, MD;  Location: [place];  Service: ENT;  Laterality: Bilateral;  . SINUS ENDO W/FUSION  sch. 09/15/2011   Procedure: ENDOSCOPIC SINUS SURGERY WITH FUSION NAVIGATION;  Surgeon: Ascencion Dike, MD;  Location: [facility];  Service: ENT;  Laterality: Bilateral;   bilateral maxillary antrostomy, bilateral endoscopic ethmoidectomy, bilateral sphenoidectomy, bilateral frontal recess exploration  . SINUS ENDO W/FUSION  09/15/2011   Procedure: ENDOSCOPIC SINUS SURGERY WITH FUSION NAVIGATION;  Surgeon: Ascencion Dike, MD;  Location: [place];  Service: ENT;  Laterality: Bilateral;  . SINUS EXPLORATION  09/15/2011   Procedure: SINUS EXPLORATION;  Surgeon: Ascencion Dike, MD;  Location: [place];  Service: ENT;  Laterality: Bilateral;  FRONTAL RECESS EXPLORATION  . SPHENOIDECTOMY  09/15/2011   Procedure: Coralee Pesa;  Surgeon: Ascencion Dike, MD;  Location: [place];  Service: ENT;  Laterality: Bilateral;  . URETEROSCOPY  1980's    Current Outpatient Prescriptions  Medication Sig Dispense Refill  . Aclidinium Bromide (TUDORZA PRESSAIR) 400 MCG/ACT AEPB Inhale 1 puff into the lungs 2 (two) times daily. 1 each  5  . albuterol (VENTOLIN HFA) 108 (90 BASE) MCG/ACT inhaler Inhale 2 puffs into the lungs every 6 (six) hours as needed for wheezing or shortness of breath. 1 Inhaler 2  . apixaban (ELIQUIS) 5 MG TABS tablet Take 1 tablet (5 mg total) by mouth 2 (two) times daily. 60 tablet 5  . atorvastatin (LIPITOR) 10 MG tablet Take 10 mg by mouth daily at 6 PM.     . budesonide-formoterol (SYMBICORT) 160-4.5 MCG/ACT inhaler Inhale 2 puffs into the lungs 2 (two) times daily. 2 Inhaler 0  . esomeprazole (NEXIUM) 40 MG capsule Take 1 capsule (40 mg total) by mouth daily. 90 capsule 6  . fluocinonide cream (LIDEX) [id number] % Apply 1 application topically daily as needed (rash). To infected area.    . fluticasone (FLONASE) 50 MCG/ACT nasal spray Place 2 sprays into the nose at bedtime. Each nostril once a day.    . furosemide (LASIX) 40 MG tablet TAKE 1 TABLET(40 MG) BY MOUTH DAILY 90 tablet 2  . metoprolol tartrate (LOPRESSOR) 25 MG tablet Take 0.5 tablets (12.5 mg total) by mouth 2 (two) times daily. 90 tablet 3  . nitroGLYCERIN (NITROSTAT) 0.4 MG SL tablet Place 1 tablet (0.4 mg total) under the  tongue every 5 (five) minutes as needed for chest pain. 75 tablet 3  . phenol (CHLORASEPTIC) 1.4 % LIQD Use as directed 1 spray in the mouth or throat as needed for throat irritation / pain.    . tamsulosin (FLOMAX) 0.4 MG CAPS capsule Take 0.4 mg by mouth daily.     No current facility-administered medications for this visit.     Allergies  Allergen Reactions  . Mometasone Furoate Other (See Comments)    Nasonex: CAUSED RED,ITCHY EYES; "lost my eyelashes"   . Naproxen Sodium Nausea And Vomiting  . Nsaids Other (See Comments)    Stomach ulcers  . Anoro Ellipta [Umeclidinium-Vilanterol] Other (See Comments)    Double vision  . Aspirin Hives and Nausea And Vomiting     Chills, sweats  . Breo Ellipta [Fluticasone Furoate-Vilanterol] Hives  . Dexilant [Dexlansoprazole] Diarrhea    Cramps   . Omnaris [Ciclesonide] Other (See Comments)    Nose bleed   . Spiriva Handihaler [Tiotropium Bromide Monohydrate] Other (See Comments)    Hiccups     Social History   Social History  . Marital status: Married    Spouse name: N/A  . Number of children: 4  . Years of education: N/A   Occupational History  . 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     Social History Main Topics  . Smoking status: Former Smoker    Packs/day: 1.00    Years: 60.00    Types: Cigarettes    Quit date: 08/06/2011  . Smokeless tobacco: Never Used  . Alcohol use 0.0 oz/week     Comment: rarely  . Drug use: No  . Sexual activity: No   Other Topics Concern  . Not on file   Social History Narrative   Traveled to [place].   Married and lives with wife.   Originally from [place].   Spends winters in [place]   Has 2 kids in [place]          Family History  Problem Relation Age of Onset  . Asthma Father   . Heart disease Mother     MI age 68  . Heart attack Mother   . Prostate cancer Brother   . Prostate cancer Brother   . Prostate cancer Brother   .  Stroke Sister   . Anesthesia problems Neg Hx   . Colon cancer Neg Hx    . Esophageal cancer Neg Hx   . Rectal cancer Neg Hx   . Stomach cancer Neg Hx     Review of Systems:  As stated in the HPI and otherwise negative.   BP 118/68   Pulse (!) 58   Ht 5\' 8"  (1.727 m)   Wt 172 lb 4 oz (78.1 kg)   BMI 26.19 kg/m   Physical Examination: General: Well developed, well nourished, NAD  HEENT: OP clear, mucus membranes moist  SKIN: warm, dry. No rashes. Neuro: No focal deficits  Musculoskeletal: Muscle strength 5/5 all ext  Psychiatric: Mood and affect normal  Neck: No JVD, no carotid bruits, no thyromegaly, no lymphadenopathy.  Lungs:Clear bilaterally, no wheezes, rhonci, crackles Cardiovascular: Regular rate and rhythm with ectopy. Soft systolic murmur. No gallops or rubs. Abdomen:Soft. Bowel sounds present. Non-tender.  Extremities: Trace bilateral lower extremity edema.. Pulses are non-palpable in the bilateral DP/PT.  Echo 10/24/14: - Left ventricle: The cavity size was normal. Wall thickness was normal. Systolic function was normal. The estimated ejection fraction was in the range of 55% to 60%. Doppler parameters are consistent with abnormal left ventricular relaxation (grade 1 diastolic dysfunction). The E/e&' ratio is between 8-15, suggesting indeterminate LV filling pressure. - Aortic valve: Sclerosis without stenosis. Transvalvular velocity was minimally increased. There was no regurgitation. - Mitral valve: Mildly thickened leaflets . There was mild regurgitation. - Left atrium: Moderately dilated at 38 ml/m2. Impressions: - Compared to the prior study in 2013, there has been no change. No significant TR, therefore RVSP cannot be estimated.  EKG:  EKG is not ordered today The ekg ordered today demonstrates   Recent Labs: 10/06/2015: ALT 15; BUN 21; Creatinine, Ser 1.58; Hemoglobin 14.7; Platelets 218; Potassium 4.6; Sodium 139   Lipid Panel    Component Value Date/Time   TRIG 56 08/28/2013 1620     Wt Readings  from Last 3 Encounters:  05/03/16 172 lb 4 oz (78.1 kg)  04/01/16 170 lb 3.1 oz (77.2 kg)  03/25/16 172 lb (78 kg)     Other studies Reviewed: Additional studies/ records that were reviewed today include:  Review of the above records demonstrates:    Assessment and Plan:   1. CAD: Elevated troponin in [facility] 9/15 following admission for syncope but cath not performed due to GI bleeding, gastric ulcers. Recent GI workup here locally per Dr. Deatra Ina with ulcer noted duodenum and evidence of non-bleeding diverticulosis. Most recent EGD may 2016 showed the ulcer had healed. He has had no further GI bleeding. Unfortunately has been diagnosed with DVT/PE in may 2016 and is now on Eliquis. Will not plan cardiac cath at this time since he has no chest pain. Will continue statin and beta blocker.    2. HLD: continue statin.   3. DVT/PE: Per primary care. On Eliquis.   4. PVCs with bigeminy/trigeminy: will continue Lopressor 12.5 mg po BID. He is being followed in the EP clinic by Dr. Lovena Le. He seems to be asymptomatic. Repeat echo before I see him back in 6 months.   5. Bilateral lower extremity edema: Continue Lasix. Elevate legs when sitting. Normal ABI December 2016.  I think it is most likely that some of his leg pain is from chronic venous insufficiency.   6. Chronic diastolic CHF: Will continue Lasix 40 mg daily.   7. HTN: BP controlled.   Current medicines are  reviewed at length with the patient today.  The patient does not have concerns regarding medicines.  The following changes have been made:  no change  Labs/ tests ordered today include:   Orders Placed This Encounter  Procedures  . ECHOCARDIOGRAM COMPLETE    Disposition:   FU with me in 6 months  Signed, Lauree Chandler, MD 05/03/2016 3:52 PM    [facility] [place], [place], [place]  [address] Phone: [phone]; Fax: [phone]

## 2016-05-03 NOTE — Telephone Encounter (Signed)
Called and spoke to pt. Pt requesting a refill of Eliquis be sent to [facility]. Rx sent to pharmacy.  Pt verbalized understanding and denied any further questions or concerns at this time.

## 2016-05-04 ENCOUNTER — Encounter (HOSPITAL_COMMUNITY)
Admission: RE | Admit: 2016-05-04 | Discharge: 2016-05-04 | Disposition: A | Discharge status: Home / Self Care | Payer: Medicare Other | Source: Ambulatory Visit | Attending: Internal Medicine | Admitting: Internal Medicine

## 2016-05-04 DIAGNOSIS — J449 Chronic obstructive pulmonary disease, unspecified: Secondary | ICD-10-CM | POA: Diagnosis not present

## 2016-05-04 NOTE — Telephone Encounter (Signed)
Medication Detail    Disp Refills Start End   nitroGLYCERIN (NITROSTAT) 0.4 MG SL tablet 75 tablet 3 05/03/2016    Sig - Route: Place 1 tablet (0.4 mg total) under the tongue every 5 (five) minutes as needed for chest pain. - Sublingual   E-Prescribing Status: Receipt confirmed by pharmacy (05/03/2016 3:38 PM EST)   Pharmacy   [id number] - [place], [place] - [address]

## 2016-05-04 NOTE — Progress Notes (Signed)
Daily Session Note  Patient Details  Name: Jon White MRN: [id number] Date of Birth: [birth year] Referring Provider:   Flowsheet Row PULMONARY REHAB COPD ORIENTATION from 04/01/2016 in [place]  Referring Provider  Dr. Chase Caller      Encounter Date: 05/04/2016  Check In:     Session Check In - 05/04/16 1330      Check-In   Location [facility]   Staff Present Diane Angelina Pih, MS, EP, [facility], Exercise Physiologist;Zauria Dombek Luther Parody, BS, EP, Exercise Physiologist   Supervising physician immediately available to respond to emergencies See telemetry face sheet for immediately available MD   Medication changes reported     No   Fall or balance concerns reported    No   Warm-up and Cool-down Performed as group-led instruction   Resistance Training Performed Yes   VAD Patient? No     Pain Assessment   Currently in Pain? No/denies   Pain Score 0-No pain   Multiple Pain Sites No      Capillary Blood Glucose: No results found for this or any previous visit (from the past 24 hour(s)).   Goals Met:  Independence with exercise equipment Improved SOB with ADL's Using PLB without cueing & demonstrates good technique Exercise tolerated well No report of cardiac concerns or symptoms Strength training completed today  Goals Unmet:  Not Applicable  Comments: Check out 230   Dr. Sinda Du is Medical Director for [facility] Pulmonary Rehab.

## 2016-05-05 DIAGNOSIS — H6123 Impacted cerumen, bilateral: Secondary | ICD-10-CM | POA: Diagnosis not present

## 2016-05-05 DIAGNOSIS — Z85828 Personal history of other malignant neoplasm of skin: Secondary | ICD-10-CM | POA: Diagnosis not present

## 2016-05-06 ENCOUNTER — Encounter (HOSPITAL_COMMUNITY)
Admission: RE | Admit: 2016-05-06 | Discharge: 2016-05-06 | Disposition: A | Discharge status: Home / Self Care | Payer: Medicare Other | Source: Ambulatory Visit | Attending: Internal Medicine | Admitting: Internal Medicine

## 2016-05-06 DIAGNOSIS — J449 Chronic obstructive pulmonary disease, unspecified: Secondary | ICD-10-CM | POA: Diagnosis not present

## 2016-05-06 NOTE — Progress Notes (Signed)
Daily Session Note  Patient Details  Name: Jon White MRN: [id number] Date of Birth: [birth year] Referring Provider:   Flowsheet Row PULMONARY REHAB COPD ORIENTATION from 04/01/2016 in [place]  Referring Provider  Dr. Chase Caller      Encounter Date: 05/06/2016  Check In:     Session Check In - 05/06/16 1330      Check-In   Location [facility]   Staff Present Aundra Dubin, RN, BSN;Yoni Lobos Luther Parody, BS, EP, Exercise Physiologist   Supervising physician immediately available to respond to emergencies See telemetry face sheet for immediately available MD   Medication changes reported     No   Fall or balance concerns reported    No   Warm-up and Cool-down Performed as group-led instruction   Resistance Training Performed Yes   VAD Patient? No     Pain Assessment   Currently in Pain? No/denies   Pain Score 0-No pain   Multiple Pain Sites No      Capillary Blood Glucose: No results found for this or any previous visit (from the past 24 hour(s)).   Goals Met:  Independence with exercise equipment Improved SOB with ADL's Using PLB without cueing & demonstrates good technique Exercise tolerated well No report of cardiac concerns or symptoms Strength training completed today  Goals Unmet:  Not Applicable  Comments: Check out 230   Dr. Sinda Du is Medical Director for [facility] Pulmonary Rehab.

## 2016-05-11 ENCOUNTER — Encounter (HOSPITAL_COMMUNITY): Payer: Medicare Other

## 2016-05-12 DIAGNOSIS — J4530 Mild persistent asthma, uncomplicated: Secondary | ICD-10-CM | POA: Diagnosis not present

## 2016-05-12 DIAGNOSIS — J339 Nasal polyp, unspecified: Secondary | ICD-10-CM | POA: Diagnosis not present

## 2016-05-12 DIAGNOSIS — R21 Rash and other nonspecific skin eruption: Secondary | ICD-10-CM | POA: Diagnosis not present

## 2016-05-12 DIAGNOSIS — J3089 Other allergic rhinitis: Secondary | ICD-10-CM | POA: Diagnosis not present

## 2016-05-12 NOTE — Progress Notes (Signed)
Pulmonary Individual Treatment Plan  Patient Details  Name: Jon White MRN: [id number] Date of Birth: 03/22/1933 Referring Provider:   Flowsheet Row PULMONARY REHAB COPD ORIENTATION from 04/01/2016 in [place]  Referring Provider  Dr. Chase Caller      Initial Encounter Date:  Flowsheet Row PULMONARY REHAB COPD ORIENTATION from 04/01/2016 in [facility]  Date  04/01/16  Referring Provider  Dr. Chase Caller      Visit Diagnosis: Stage 2 moderate COPD by GOLD classification ([place])  Patient's Home Medications on Admission:   Current Outpatient Prescriptions:  .  Aclidinium Bromide (TUDORZA PRESSAIR) 400 MCG/ACT AEPB, Inhale 1 puff into the lungs 2 (two) times daily., Disp: 1 each, Rfl: 5 .  albuterol (VENTOLIN HFA) 108 (90 BASE) MCG/ACT inhaler, Inhale 2 puffs into the lungs every 6 (six) hours as needed for wheezing or shortness of breath., Disp: 1 Inhaler, Rfl: 2 .  apixaban (ELIQUIS) 5 MG TABS tablet, Take 1 tablet (5 mg total) by mouth 2 (two) times daily., Disp: 180 tablet, Rfl: 3 .  atorvastatin (LIPITOR) 10 MG tablet, Take 10 mg by mouth daily at 6 PM. , Disp: , Rfl:  .  budesonide-formoterol (SYMBICORT) 160-4.5 MCG/ACT inhaler, Inhale 2 puffs into the lungs 2 (two) times daily., Disp: 2 Inhaler, Rfl: 0 .  esomeprazole (NEXIUM) 40 MG capsule, Take 1 capsule (40 mg total) by mouth daily., Disp: 90 capsule, Rfl: 6 .  fluocinonide cream (LIDEX) [id number] %, Apply 1 application topically daily as needed (rash). To infected area., Disp: , Rfl:  .  fluticasone (FLONASE) 50 MCG/ACT nasal spray, Place 2 sprays into the nose at bedtime. Each nostril once a day., Disp: , Rfl:  .  furosemide (LASIX) 40 MG tablet, TAKE 1 TABLET(40 MG) BY MOUTH DAILY, Disp: 90 tablet, Rfl: 2 .  metoprolol tartrate (LOPRESSOR) 25 MG tablet, Take 0.5 tablets (12.5 mg total) by mouth 2 (two) times daily., Disp: 90 tablet, Rfl: 3 .  nitroGLYCERIN (NITROSTAT) 0.4 MG SL tablet,  Place 1 tablet (0.4 mg total) under the tongue every 5 (five) minutes as needed for chest pain., Disp: 75 tablet, Rfl: 3 .  phenol (CHLORASEPTIC) 1.4 % LIQD, Use as directed 1 spray in the mouth or throat as needed for throat irritation / pain., Disp: , Rfl:  .  tamsulosin (FLOMAX) 0.4 MG CAPS capsule, Take 0.4 mg by mouth daily., Disp: , Rfl:   Past Medical History: Past Medical History:  Diagnosis Date  . Allergic rhinitis   . Allergy   . Asthma   . Atrial fibrillation ([place])   . Bleeding gastric ulcer   . Blood transfusion without reported diagnosis   . Cataract    bilateral removed  . Chronic renal insufficiency, stage III (moderate)   . COPD (chronic obstructive pulmonary disease) ([place])   . Diabetes mellitus without complication ([place])    no meds- boarderline  . Duodenum ulcer    with perforation  . DVT of axillary vein, acute left ([place]) 10/24/2014  . Emphysema   . GERD (gastroesophageal reflux disease)   . H/O hiatal hernia   . High cholesterol   . Hypertension   . Mixed hyperlipidemia   . Myocardial infarction   . Neuromuscular disorder ([place])    reports shaking, somedays "can't hold spoon" - generalized  shakiness, is going to talk to MD about it at next appt.   . Normal cardiac stress test    reports stress test was several yrs. ago /w [employer]  Med. Dr. Rogelia Mire, WNL   . OSA (obstructive sleep apnea) 2007   CPAP- report of settings on paper chart, seen by [facility] - seen 07/2011   . PE (pulmonary embolism) 10/2011  . Prostate cancer ([place]) 1992   in remission.  s/p XRT, melanoma under left eye, left temple  . Recurrent upper respiratory infection (URI)    coughing from chronic sinusitis   . Renal stone    some passed spontaneous, & also had cystoscopy  . Shortness of breath on exertion   . Sleep apnea    wears c-pap    Tobacco Use: History  Smoking Status  . Former Smoker  . Packs/day: 1.00  . Years: 60.00  . Types: Cigarettes  . Quit date: 08/06/2011  Smokeless  Tobacco  . Never Used    Labs: Recent Review Flowsheet Data    Labs for ITP Cardiac and Pulmonary Rehab Latest Ref Rng & Units 08/28/2013 08/31/2013   Trlycerides <150 mg/dL 56 -   Hemoglobin A1c <5.7 % - 6.1(H)   PHART 7.350 - 7.450 7.347(L) -   PCO2ART 35.0 - 45.0 mmHg 46.0(H) -   HCO3 20.0 - 24.0 mEq/L 25.3(H) -   TCO2 0 - 100 mmol/L 27 -   ACIDBASEDEF 0.0 - 2.0 mmol/L 1.0 -   O2SAT % 99.0 -      Capillary Blood Glucose: Lab Results  Component Value Date   GLUCAP 137 (H) 08/30/2013   GLUCAP 111 (H) 08/30/2013   GLUCAP 131 (H) 08/30/2013   GLUCAP 138 (H) 08/30/2013   GLUCAP 136 (H) 08/29/2013     ADL UCSD:     Pulmonary Assessment Scores    Row Name 04/01/16 1118         ADL UCSD   ADL Phase Entry     SOB Score total 40     Rest 1     Walk 6     Stairs 1     Bath 2     Dress 2     Shop 3       CAT Score   CAT Score 22       mMRC Score   mMRC Score 3        Pulmonary Function Assessment:     Pulmonary Function Assessment - 04/01/16 1112      Pulmonary Function Tests   FVC% 70 %   FEV1% 42 %   FEV1/FVC Ratio 39   RV% 140 %   DLCO% 56 %     Initial Spirometry Results   FVC% 70 %   FEV1% 42 %   FEV1/FVC Ratio 39     Post Bronchodilator Spirometry Results   FVC% 25 %   FEV1% 38 %   FEV1/FVC Ratio 44     Breath   Shortness of Breath Yes      Exercise Target Goals:    Exercise Program Goal: Individual exercise prescription set with THRR, safety & activity barriers. Participant demonstrates ability to understand and report RPE using BORG scale, to self-measure pulse accurately, and to acknowledge the importance of the exercise prescription.  Exercise Prescription Goal: Starting with aerobic activity 30 plus minutes a day, 3 days per week for initial exercise prescription. Provide home exercise prescription and guidelines that participant acknowledges understanding prior to discharge.  Activity Barriers & Risk Stratification:      Activity Barriers & Cardiac Risk Stratification - 04/01/16 1046      Activity Barriers & Cardiac Risk Stratification  Activity Barriers Back Problems   Cardiac Risk Stratification High  Patient has had MI in past 02/08/2014      6 Minute Walk:     6 Minute Walk    Row Name 04/01/16 1150         6 Minute Walk   Phase Initial     Distance 1200 feet     Distance % Change 0 %     Walk Time 6 minutes     # of Rest Breaks 0     MPH 2.27     METS 2.74     RPE 13     Perceived Dyspnea  14     VO2 Peak 7.02     Symptoms No     Resting HR 68 bpm     Resting BP 94/62     Max Ex. HR 83 bpm     Max Ex. BP 120/64     2 Minute Post BP 104/62        Initial Exercise Prescription:     Initial Exercise Prescription - 04/01/16 1100      Date of Initial Exercise RX and Referring Provider   Date 04/01/16   Referring Provider Dr. Chase Caller     Treadmill   MPH 1.2   Grade 0   Minutes 15   METs 1.9     NuStep   Level 2   Watts 12   Minutes 20   METs 1.7     Prescription Details   Frequency (times per week) 2   Duration Progress to 30 minutes of continuous aerobic without signs/symptoms of physical distress     Intensity   THRR REST +  20   THRR 40-80% of Max Heartrate [phone]   Ratings of Perceived Exertion 11-13   Perceived Dyspnea 0-4     Progression   Progression Continue progressive overload as per policy without signs/symptoms or physical distress.     Resistance Training   Training Prescription Yes   Weight 1   Reps 10-12      Perform Capillary Blood Glucose checks as needed.  Exercise Prescription Changes:      Exercise Prescription Changes    Row Name 04/13/16 1200 05/06/16 1500           Exercise Review   Progression No Yes        Response to Exercise   Blood Pressure (Admit) 108/70 112/62      Blood Pressure (Exercise) 132/74 138/70      Blood Pressure (Exit) 98/60 102/56      Heart Rate (Admit) 66 bpm 60 bpm      Heart Rate  (Exercise) 64 bpm 91 bpm      Heart Rate (Exit) 43 bpm 95 bpm      Oxygen Saturation (Admit) 94 % 94 %      Oxygen Saturation (Exercise) 92 % 91 %      Oxygen Saturation (Exit) 96 % 91 %      Rating of Perceived Exertion (Exercise) 13 11      Perceived Dyspnea (Exercise) 13 11      Duration Progress to 30 minutes of continuous aerobic without signs/symptoms of physical distress Progress to 30 minutes of continuous aerobic without signs/symptoms of physical distress      Intensity Rest + 20 Rest + 20        Progression   Progression Continue progressive overload as per policy without signs/symptoms or physical distress. Continue progressive overload  as per policy without signs/symptoms or physical distress.        Resistance Training   Training Prescription Yes Yes      Weight 1 2      Reps 10-12 10-12        Treadmill   MPH 1.2 1.6      Grade 0 0      Minutes 15 15      METs 1.9 2.2        NuStep   Level 2 3      Watts 11 16      Minutes 20 20      METs 1.8 3.52        Home Exercise Plan   Plans to continue exercise at [place] 2 additional days to program exercise sessions. Add 2 additional days to program exercise sessions.         Exercise Comments:      Exercise Comments    Row Name 04/13/16 1256 05/06/16 1512         Exercise Comments Patient has just started pulmonary rehab and will be progressed as needed.  Patient is progressing appropriately          Discharge Exercise Prescription (Final Exercise Prescription Changes):     Exercise Prescription Changes - 05/06/16 1500      Exercise Review   Progression Yes     Response to Exercise   Blood Pressure (Admit) 112/62   Blood Pressure (Exercise) 138/70   Blood Pressure (Exit) 102/56   Heart Rate (Admit) 60 bpm   Heart Rate (Exercise) 91 bpm   Heart Rate (Exit) 95 bpm   Oxygen Saturation (Admit) 94 %   Oxygen Saturation (Exercise) 91 %   Oxygen Saturation (Exit) 91 %   Rating  of Perceived Exertion (Exercise) 11   Perceived Dyspnea (Exercise) 11   Duration Progress to 30 minutes of continuous aerobic without signs/symptoms of physical distress   Intensity Rest + 20     Progression   Progression Continue progressive overload as per policy without signs/symptoms or physical distress.     Resistance Training   Training Prescription Yes   Weight 2   Reps 10-12     Treadmill   MPH 1.6   Grade 0   Minutes 15   METs 2.2     NuStep   Level 3   Watts 16   Minutes 20   METs 3.52     Home Exercise Plan   Plans to continue exercise at Home   Frequency Add 2 additional days to program exercise sessions.      Nutrition:  Target Goals: Understanding of nutrition guidelines, daily intake of sodium 1500mg , cholesterol 200mg , calories 30% from fat and 7% or less from saturated fats, daily to have 5 or more servings of fruits and vegetables.  Biometrics:     Pre Biometrics - 04/01/16 1152      Pre Biometrics   Height 5\' 8"  (1.727 m)   Weight 170 lb 3.1 oz (77.2 kg)   Waist Circumference 41.5 inches   Hip Circumference 38.5 inches   Waist to Hip Ratio 1.08 %   BMI (Calculated) 25.9   Triceps Skinfold 7 mm   % Body Fat 25 %   Grip Strength 90 kg   Flexibility 0 in   Single Leg Stand 5 seconds       Nutrition Therapy Plan and Nutrition Goals:   Nutrition  Discharge: Rate Your Plate Scores:     Nutrition Assessments - 04/01/16 1127      Rate Your Plate Scores   Pre Score 40      Psychosocial: Target Goals: Acknowledge presence or absence of depression, maximize coping skills, provide positive support system. Participant is able to verbalize types and ability to use techniques and skills needed for reducing stress and depression.  Initial Review & Psychosocial Screening:     Initial Psych Review & Screening - 04/01/16 1133      Initial Review   Current issues with --  Not being able to do what he use to do.     Family Dynamics    Good Support System? Yes     Barriers   Psychosocial barriers to participate in program There are no identifiable barriers or psychosocial needs.     Screening Interventions   Interventions Encouraged to exercise      Quality of Life Scores:     Quality of Life - 04/01/16 1153      Quality of Life Scores   Health/Function Pre 17.09 %   Socioeconomic Pre 22.5 %   Psych/Spiritual Pre 22.5 %   Family Pre 24 %   GLOBAL Pre 20.18 %      PHQ-9: Recent Review Flowsheet Data    Depression screen [facility] 2/9 04/01/2016   Decreased Interest 0   Down, Depressed, Hopeless 0   PHQ - 2 Score 0   Altered sleeping 1   Tired, decreased energy 1   Change in appetite 0   Feeling bad or failure about yourself  0   Trouble concentrating 0   Moving slowly or fidgety/restless 0   Suicidal thoughts 0   PHQ-9 Score 2   Difficult doing work/chores Not difficult at all      Psychosocial Evaluation and Intervention:     Psychosocial Evaluation - 04/01/16 1134      Psychosocial Evaluation & Interventions   Interventions --  No referral needed.    Continued Psychosocial Services Needed No      Psychosocial Re-Evaluation:     Psychosocial Re-Evaluation    [place] Name 05/12/16 1450             Psychosocial Re-Evaluation   Interventions Encouraged to attend Pulmonary Rehabilitation for the exercise       Comments Patient's QOL score was 20.18 and his PHQ-9 score was 2. He continues to have no psychosocial issues identified.        Continued Psychosocial Services Needed No          Education: Education Goals: Education classes will be provided on a weekly basis, covering required topics. Participant will state understanding/return demonstration of topics presented.  Learning Barriers/Preferences:     Learning Barriers/Preferences - 04/01/16 1048      Learning Barriers/Preferences   Learning Barriers Hearing   Learning Preferences Skilled Demonstration;Individual  Instruction;Group Instruction      Education Topics: How Lungs Work and Diseases: - Discuss the anatomy of the lungs and diseases that can affect the lungs, such as COPD.   Exercise: -Discuss the importance of exercise, FITT principles of exercise, normal and abnormal responses to exercise, and how to exercise safely. Flowsheet Row PULMONARY REHAB CHRONIC OBSTRUCTIVE PULMONARY DISEASE from 05/06/2016 in [place]  Date  04/08/16  Educator  [place]  Instruction Review Code  2- meets goals/outcomes      Environmental Irritants: -Discuss types of environmental irritants and how to limit exposure to  environmental irritants. Flowsheet Row PULMONARY REHAB CHRONIC OBSTRUCTIVE PULMONARY DISEASE from 05/06/2016 in [place]  Date  04/15/16  Educator  Nils Flack  Instruction Review Code  2- meets goals/outcomes      Meds/Inhalers and oxygen: - Discuss respiratory medications, definition of an inhaler and oxygen, and the proper way to use an inhaler and oxygen.   Energy Saving Techniques: - Discuss methods to conserve energy and decrease shortness of breath when performing activities of daily living.  Flowsheet Row PULMONARY REHAB CHRONIC OBSTRUCTIVE PULMONARY DISEASE from 05/06/2016 in [place]  Date  04/29/16  Educator  Suzanne Boron  Instruction Review Code  2- meets goals/outcomes      Bronchial Hygiene / Breathing Techniques: - Discuss breathing mechanics, pursed-lip breathing technique,  proper posture, effective ways to clear airways, and other functional breathing techniques Flowsheet Row PULMONARY REHAB CHRONIC OBSTRUCTIVE PULMONARY DISEASE from 05/06/2016 in [place]  Date  05/06/16  Educator  Nils Flack  Instruction Review Code  2- meets goals/outcomes      Cleaning Equipment: - Provides group verbal and written instruction about the health risks of elevated stress, cause of high  stress, and healthy ways to reduce stress.   Nutrition I: Fats: - Discuss the types of cholesterol, what cholesterol does to the body, and how cholesterol levels can be controlled.   Nutrition II: Labels: -Discuss the different components of food labels and how to read food labels.   Respiratory Infections: - Discuss the signs and symptoms of respiratory infections, ways to prevent respiratory infections, and the importance of seeking medical treatment when having a respiratory infection.   Stress I: Signs and Symptoms: - Discuss the causes of stress, how stress may lead to anxiety and depression, and ways to limit stress.   Stress II: Relaxation: -Discuss relaxation techniques to limit stress.   Oxygen for Home/Travel: - Discuss how to prepare for travel when on oxygen and proper ways to transport and store oxygen to ensure safety.   Knowledge Questionnaire Score:     Knowledge Questionnaire Score - 04/01/16 1111      Knowledge Questionnaire Score   Pre Score 9/14      Core Components/Risk Factors/Patient Goals at Admission:     Personal Goals and Risk Factors at Admission - 04/01/16 1129      Core Components/Risk Factors/Patient Goals on Admission    Weight Management Obesity   Increase Strength and Stamina Yes   Intervention Provide advice, education, support and counseling about physical activity/exercise needs.;Develop an individualized exercise prescription for aerobic and resistive training based on initial evaluation findings, risk stratification, comorbidities and participant's personal goals.   Expected Outcomes Achievement of increased cardiorespiratory fitness and enhanced flexibility, muscular endurance and strength shown through measurements of functional capacity and personal statement of participant.   Improve shortness of breath with ADL's Yes   Intervention Provide education, individualized exercise plan and daily activity instruction to help decrease  symptoms of SOB with activities of daily living.   Expected Outcomes Short Term: Achieves a reduction of symptoms when performing activities of daily living.   Develop more efficient breathing techniques such as purse lipped breathing and diaphragmatic breathing; and practicing self-pacing with activity Yes   Intervention Provide education, demonstration and support about specific breathing techniuqes utilized for more efficient breathing. Include techniques such as pursed lipped breathing, diaphragmatic breathing and self-pacing activity.   Expected Outcomes Short Term: Participant will be able to demonstrate and use breathing techniques  as needed throughout daily activities.   Personal Goal Other Yes   Personal Goal To be able to do what he use to do without SOB. Walk outdoors more, and be more active in general.    Intervention Attend PR 2 x week and supplement his exercise 3 x week at home.    Expected Outcomes Reach personal goals.       Core Components/Risk Factors/Patient Goals Review:      Goals and Risk Factor Review    Row Name 04/01/16 1132 05/12/16 1448           Core Components/Risk Factors/Patient Goals Review   Personal Goals Review Weight Management/Obesity;Increase Strength and Stamina;Improve shortness of breath with ADL's;Develop more efficient breathing techniques such as purse lipped breathing and diaphragmatic breathing and practicing self-pacing with activity. Weight Management/Obesity;Increase Strength and Stamina;Improve shortness of breath with ADL's;Develop more efficient breathing techniques such as purse lipped breathing and diaphragmatic breathing and practicing self-pacing with activity.  To be able to do what he use to do without SOB. Walk outdoors more, and be more active in general.       Review  - Patient has attended 9 sessions with some progress and some increased strength and stamina. He has maintained his weight. Will continue to monitor for progress.        Expected Outcomes  - Patient will continue to attend sessions meeting his personal goals.          Core Components/Risk Factors/Patient Goals at Discharge (Final Review):      Goals and Risk Factor Review - 05/12/16 1448      Core Components/Risk Factors/Patient Goals Review   Personal Goals Review Weight Management/Obesity;Increase Strength and Stamina;Improve shortness of breath with ADL's;Develop more efficient breathing techniques such as purse lipped breathing and diaphragmatic breathing and practicing self-pacing with activity.  To be able to do what he use to do without SOB. Walk outdoors more, and be more active in general.    Review Patient has attended 9 sessions with some progress and some increased strength and stamina. He has maintained his weight. Will continue to monitor for progress.    Expected Outcomes Patient will continue to attend sessions meeting his personal goals.       ITP Comments:     ITP Comments    Row Name 04/01/16 1120 04/14/16 1418         ITP Comments Mr. Heaton is an 82 yr old gentleman that was here for his initial visit. He attended the basic heart healthy group nutrition class today 04/01/16 with dietician. He went over principles of rating your plate and how to plan and eat a heart healthy meal. The importance of keeping your weight down, how to choose healthy meals while dining out.  Patient new to program. Has attended 3 sessions. Will continue to monitor for progress.          Comments: ITP 30 Day REVIEW Pt is making expected progress toward pulmonary rehab goals after completing 9 sessions. Recommend continued exercise, life style modification, education, and utilization of breathing techniques to increase stamina and strength and decrease shortness of breath with exertion.

## 2016-05-13 ENCOUNTER — Encounter (HOSPITAL_COMMUNITY)
Admission: RE | Admit: 2016-05-13 | Discharge: 2016-05-13 | Disposition: A | Discharge status: Home / Self Care | Payer: Medicare Other | Source: Ambulatory Visit | Attending: Internal Medicine | Admitting: Internal Medicine

## 2016-05-13 DIAGNOSIS — J449 Chronic obstructive pulmonary disease, unspecified: Secondary | ICD-10-CM | POA: Diagnosis not present

## 2016-05-13 NOTE — Progress Notes (Signed)
Daily Session Note  Patient Details  Name: Jon White MRN: [id number] Date of Birth: 12/26/1932 Referring Provider:   Flowsheet Row PULMONARY REHAB COPD ORIENTATION from 04/01/2016 in [place]  Referring Provider  Dr. Chase Caller      Encounter Date: 05/13/2016  Check In:     Session Check In - 05/13/16 0900      Check-In   Location [facility]   Staff Present Aundra Dubin, RN, BSN;Courtne Lighty Luther Parody, BS, EP, Exercise Physiologist   Supervising physician immediately available to respond to emergencies See telemetry face sheet for immediately available MD   Medication changes reported     No   Fall or balance concerns reported    No   Warm-up and Cool-down Performed as group-led instruction   Resistance Training Performed Yes   VAD Patient? No     Pain Assessment   Currently in Pain? No/denies   Pain Score 0-No pain   Multiple Pain Sites No      Capillary Blood Glucose: No results found for this or any previous visit (from the past 24 hour(s)).   Goals Met:  Independence with exercise equipment Exercise tolerated well No report of cardiac concerns or symptoms Strength training completed today  Goals Unmet:  Not Applicable  Comments: Check out 1000   Dr. Sinda Du is Medical Director for [facility].

## 2016-05-13 NOTE — Progress Notes (Signed)
He is here for scheduled 30 day reevaluation for pulmonary rehabilitation. He has been attending regularly although he says he did miss 1 day. He feels like he is doing much better. He specifically mentions that he is much better able to climb stairs at his home than he was before participating in the program. I have reviewed all the notes.  Exam shows that he is awake and alert. He does look less short of breath than when I first met him. His chest is clear. No edema. No distress.  He is doing very well with pulmonary rehabilitation and finds to be very helpful. Objectively he finds that he can climb his steps better than before.  He plans to continue and I think that's appropriate

## 2016-05-13 NOTE — Progress Notes (Signed)
Jon White 82 y.o. male  48 Day Psychosocial Note  Patient psychosocial assessment reveals no barriers to participation in Pulmonary Rehab. Psychosocial areas that are currently affecting patient's rehab experience include concerns about health issues. Patient does continue to exhibit positive coping skills to deal with his psychosocial concerns. Offered emotional support and reassurance. Patient does feel he is making progress toward Pulmonary Rehab goals. Patient reports his health and activity level has improved in the past 30 days as evidenced by patient's report of increased ability to walk more and do some outside activities. Patient states family/friends have noticed changes in his activity or mood. Patient reports  feeling positive about current and projected progression in Pulmonary Rehab. After reviewing the patient's treatment plan, the patient is making progress toward Pulmonary Rehab goals. Patient's rate of progress toward rehab goals is good. Plan of action to help patient continue to work towards rehab goals include continuing to attend Pulmonary Rehab sessions. Will continue to monitor and evaluate progress toward psychosocial goal(s).  Goal(s) in progress:  Improved coping skills Help patient work toward returning to meaningful activities that improve patient's QOL and are attainable with patient's lung disease

## 2016-05-18 ENCOUNTER — Encounter (HOSPITAL_COMMUNITY)
Admission: RE | Admit: 2016-05-18 | Discharge: 2016-05-18 | Disposition: A | Discharge status: Home / Self Care | Payer: Medicare Other | Source: Ambulatory Visit | Attending: Internal Medicine | Admitting: Internal Medicine

## 2016-05-18 DIAGNOSIS — J449 Chronic obstructive pulmonary disease, unspecified: Secondary | ICD-10-CM

## 2016-05-18 NOTE — Progress Notes (Signed)
Daily Session Note  Patient Details  Name: Jon White MRN: [id number] Date of Birth: April 07, 1933 Referring Provider:   Flowsheet Row PULMONARY REHAB COPD ORIENTATION from 04/01/2016 in [place]  Referring Provider  Dr. Chase Caller      Encounter Date: 05/18/2016  Check In:     Session Check In - 05/18/16 1330      Check-In   Location [facility]   Staff Present Suzanne Boron, BS, EP, Exercise Physiologist   Supervising physician immediately available to respond to emergencies See telemetry face sheet for immediately available MD   Medication changes reported     No   Fall or balance concerns reported    No   Warm-up and Cool-down Performed as group-led instruction   Resistance Training Performed Yes   VAD Patient? No     Pain Assessment   Currently in Pain? No/denies   Pain Score 0-No pain   Multiple Pain Sites No      Capillary Blood Glucose: No results found for this or any previous visit (from the past 24 hour(s)).   Goals Met:  Independence with exercise equipment Improved SOB with ADL's Using PLB without cueing & demonstrates good technique Exercise tolerated well No report of cardiac concerns or symptoms Strength training completed today  Goals Unmet:  Not Applicable  Comments: Check out 230   Dr. Sinda Du is Medical Director for [facility] Pulmonary Rehab.

## 2016-05-20 ENCOUNTER — Encounter (HOSPITAL_COMMUNITY)
Admission: RE | Admit: 2016-05-20 | Discharge: 2016-05-20 | Disposition: A | Discharge status: Home / Self Care | Payer: Medicare Other | Source: Ambulatory Visit | Attending: Internal Medicine | Admitting: Internal Medicine

## 2016-05-20 DIAGNOSIS — J449 Chronic obstructive pulmonary disease, unspecified: Secondary | ICD-10-CM

## 2016-05-20 NOTE — Progress Notes (Signed)
Daily Session Note  Patient Details  Name: [place] SOUTER MRN: [id number] Date of Birth: 1932/09/17 Referring Provider:   Flowsheet Row PULMONARY REHAB COPD ORIENTATION from 04/01/2016 in [place]  Referring Provider  Dr. Chase Caller      Encounter Date: 05/20/2016  Check In:     Session Check In - 05/20/16 1408      Check-In   Location [facility]   Staff Present Aundra Dubin, RN, BSN;Kinleigh Nault Luther Parody, BS, EP, Exercise Physiologist   Supervising physician immediately available to respond to emergencies See telemetry face sheet for immediately available MD   Medication changes reported     No   Fall or balance concerns reported    No   Warm-up and Cool-down Performed as group-led instruction   Resistance Training Performed Yes   VAD Patient? No     Pain Assessment   Currently in Pain? No/denies   Pain Score 0-No pain   Multiple Pain Sites No      Capillary Blood Glucose: No results found for this or any previous visit (from the past 24 hour(s)).   Goals Met:  Independence with exercise equipment Improved SOB with ADL's Using PLB without cueing & demonstrates good technique Exercise tolerated well No report of cardiac concerns or symptoms Strength training completed today  Goals Unmet:  Not Applicable  Comments: Check out 230   Dr. Sinda Du is Medical Director for [facility] Pulmonary Rehab.

## 2016-05-25 ENCOUNTER — Encounter (HOSPITAL_COMMUNITY)
Admission: RE | Admit: 2016-05-25 | Discharge: 2016-05-25 | Disposition: A | Discharge status: Home / Self Care | Payer: Medicare Other | Source: Ambulatory Visit | Attending: Internal Medicine | Admitting: Internal Medicine

## 2016-05-25 ENCOUNTER — Telehealth: Payer: Self-pay | Admitting: Internal Medicine

## 2016-05-25 DIAGNOSIS — J449 Chronic obstructive pulmonary disease, unspecified: Secondary | ICD-10-CM | POA: Diagnosis not present

## 2016-05-25 NOTE — Telephone Encounter (Signed)
Will need to check with [facility] about the eliquis.  This will need PA for refill of jan 2018.  Will hold in my box for tomorrow.

## 2016-05-25 NOTE — Progress Notes (Signed)
Daily Session Note  Patient Details  Name: Jon White MRN: [id number] Date of Birth: 04-10-1933 Referring Provider:   Flowsheet Row PULMONARY REHAB COPD ORIENTATION from 04/01/2016 in [place]  Referring Provider  Dr. Chase Caller      Encounter Date: 05/25/2016  Check In:     Session Check In - 05/25/16 1330      Check-In   Location [facility]   Staff Present Suzanne Boron, BS, EP, Exercise Physiologist;Debra Wynetta Emery, RN, BSN   Supervising physician immediately available to respond to emergencies See telemetry face sheet for immediately available MD   Medication changes reported     No   Fall or balance concerns reported    No   Warm-up and Cool-down Performed as group-led instruction   Resistance Training Performed Yes   VAD Patient? No     Pain Assessment   Currently in Pain? No/denies   Pain Score 0-No pain   Multiple Pain Sites No      Capillary Blood Glucose: No results found for this or any previous visit (from the past 24 hour(s)).   Goals Met:  Independence with exercise equipment Improved SOB with ADL's Using PLB without cueing & demonstrates good technique Exercise tolerated well No report of cardiac concerns or symptoms Strength training completed today  Goals Unmet:  Not Applicable  Comments: Check out 203   Dr. Sinda Du is Medical Director for [facility] Pulmonary Rehab.

## 2016-05-27 ENCOUNTER — Encounter (HOSPITAL_COMMUNITY): Payer: Medicare Other

## 2016-06-01 ENCOUNTER — Encounter (HOSPITAL_COMMUNITY): Admission: RE | Admit: 2016-06-01 | Payer: Medicare Other | Source: Ambulatory Visit

## 2016-06-01 NOTE — Telephone Encounter (Signed)
This rx was just filled on 12/4.  We will have to follow up with pharmacy on his next refill to make sure that his insurance will cover this medication.

## 2016-06-03 ENCOUNTER — Encounter (HOSPITAL_COMMUNITY): Payer: Medicare Other

## 2016-06-08 ENCOUNTER — Encounter (HOSPITAL_COMMUNITY)
Admission: RE | Admit: 2016-06-08 | Discharge: 2016-06-08 | Disposition: A | Discharge status: Home / Self Care | Payer: Medicare Other | Source: Ambulatory Visit | Attending: Internal Medicine | Admitting: Internal Medicine

## 2016-06-08 ENCOUNTER — Other Ambulatory Visit: Payer: Self-pay | Admitting: Cardiovascular Disease

## 2016-06-08 DIAGNOSIS — J449 Chronic obstructive pulmonary disease, unspecified: Secondary | ICD-10-CM | POA: Insufficient documentation

## 2016-06-08 NOTE — Telephone Encounter (Signed)
Rx refill sent to pharmacy. 

## 2016-06-09 NOTE — Progress Notes (Signed)
Pulmonary Individual Treatment Plan  Patient Details  Name: Jon White MRN: [id number] Date of Birth: Jun 23, 1932 Referring Provider:   Flowsheet Row PULMONARY REHAB COPD ORIENTATION from 04/01/2016 in [place]  Referring Provider  Dr. Chase Caller      Initial Encounter Date:  Flowsheet Row PULMONARY REHAB COPD ORIENTATION from 04/01/2016 in [place]  Date  04/01/16  Referring Provider  Dr. Chase Caller      Visit Diagnosis: Stage 2 moderate COPD by GOLD classification (Green)  Patient's Home Medications on Admission:   Current Outpatient Prescriptions:  .  Aclidinium Bromide (TUDORZA PRESSAIR) 400 MCG/ACT AEPB, Inhale 1 puff into the lungs 2 (two) times daily., Disp: 1 each, Rfl: 5 .  albuterol (VENTOLIN HFA) 108 (90 BASE) MCG/ACT inhaler, Inhale 2 puffs into the lungs every 6 (six) hours as needed for wheezing or shortness of breath., Disp: 1 Inhaler, Rfl: 2 .  apixaban (ELIQUIS) 5 MG TABS tablet, Take 1 tablet (5 mg total) by mouth 2 (two) times daily., Disp: 180 tablet, Rfl: 3 .  atorvastatin (LIPITOR) 10 MG tablet, Take 10 mg by mouth daily at 6 PM. , Disp: , Rfl:  .  budesonide-formoterol (SYMBICORT) 160-4.5 MCG/ACT inhaler, Inhale 2 puffs into the lungs 2 (two) times daily., Disp: 2 Inhaler, Rfl: 0 .  esomeprazole (NEXIUM) 40 MG capsule, Take 1 capsule (40 mg total) by mouth daily., Disp: 90 capsule, Rfl: 6 .  fluocinonide cream (LIDEX) [id number] %, Apply 1 application topically daily as needed (rash). To infected area., Disp: , Rfl:  .  fluticasone (FLONASE) 50 MCG/ACT nasal spray, Place 2 sprays into the nose at bedtime. Each nostril once a day., Disp: , Rfl:  .  furosemide (LASIX) 40 MG tablet, TAKE 1 TABLET(40 MG) BY MOUTH DAILY, Disp: 90 tablet, Rfl: 2 .  metoprolol tartrate (LOPRESSOR) 25 MG tablet, TAKE 1/2 TABLET BY MOUTH TWICE DAILY, Disp: 30 tablet, Rfl: 5 .  nitroGLYCERIN (NITROSTAT) 0.4 MG SL tablet, Place 1 tablet (0.4 mg  total) under the tongue every 5 (five) minutes as needed for chest pain., Disp: 75 tablet, Rfl: 3 .  phenol (CHLORASEPTIC) 1.4 % LIQD, Use as directed 1 spray in the mouth or throat as needed for throat irritation / pain., Disp: , Rfl:  .  tamsulosin (FLOMAX) 0.4 MG CAPS capsule, Take 0.4 mg by mouth daily., Disp: , Rfl:   Past Medical History: Past Medical History:  Diagnosis Date  . Allergic rhinitis   . Allergy   . Asthma   . Atrial fibrillation ([place])   . Bleeding gastric ulcer   . Blood transfusion without reported diagnosis   . Cataract    bilateral removed  . Chronic renal insufficiency, stage III (moderate)   . COPD (chronic obstructive pulmonary disease) ([place])   . Diabetes mellitus without complication ([place])    no meds- boarderline  . Duodenum ulcer    with perforation  . DVT of axillary vein, acute left ([place]) 10/24/2014  . Emphysema   . GERD (gastroesophageal reflux disease)   . H/O hiatal hernia   . High cholesterol   . Hypertension   . Mixed hyperlipidemia   . Myocardial infarction   . Neuromuscular disorder ([place])    reports shaking, somedays "can't hold spoon" - generalized  shakiness, is going to talk to MD about it at next appt.   . Normal cardiac stress test    reports stress test was several yrs. ago /w [employer]. Dr. Rogelia Mire, WNL   .  OSA (obstructive sleep apnea) 2007   CPAP- report of settings on paper chart, seen by [facility] - seen 07/2011   . PE (pulmonary embolism) 10/2011  . Prostate cancer ([place]) 1992   in remission.  s/p XRT, melanoma under left eye, left temple  . Recurrent upper respiratory infection (URI)    coughing from chronic sinusitis   . Renal stone    some passed spontaneous, & also had cystoscopy  . Shortness of breath on exertion   . Sleep apnea    wears c-pap    Tobacco Use: History  Smoking Status  . Former Smoker  . Packs/day: 1.00  . Years: 60.00  . Types: Cigarettes  . Quit date: 08/06/2011  Smokeless Tobacco  . Never Used     Labs: Recent Review Flowsheet Data    Labs for ITP Cardiac and Pulmonary Rehab Latest Ref Rng & Units 08/28/2013 08/31/2013   Trlycerides <150 mg/dL 56 -   Hemoglobin A1c <5.7 % - 6.1(H)   PHART 7.350 - 7.450 7.347(L) -   PCO2ART 35.0 - 45.0 mmHg 46.0(H) -   HCO3 20.0 - 24.0 mEq/L 25.3(H) -   TCO2 0 - 100 mmol/L 27 -   ACIDBASEDEF 0.0 - 2.0 mmol/L 1.0 -   O2SAT % 99.0 -      Capillary Blood Glucose: Lab Results  Component Value Date   GLUCAP 137 (H) 08/30/2013   GLUCAP 111 (H) 08/30/2013   GLUCAP 131 (H) 08/30/2013   GLUCAP 138 (H) 08/30/2013   GLUCAP 136 (H) 08/29/2013     ADL UCSD:     Pulmonary Assessment Scores    Row Name 04/01/16 1118         ADL UCSD   ADL Phase Entry     SOB Score total 40     Rest 1     Walk 6     Stairs 1     Bath 2     Dress 2     Shop 3       CAT Score   CAT Score 22       mMRC Score   mMRC Score 3        Pulmonary Function Assessment:     Pulmonary Function Assessment - 04/01/16 1112      Pulmonary Function Tests   FVC% 70 %   FEV1% 42 %   FEV1/FVC Ratio 39   RV% 140 %   DLCO% 56 %     Initial Spirometry Results   FVC% 70 %   FEV1% 42 %   FEV1/FVC Ratio 39     Post Bronchodilator Spirometry Results   FVC% 25 %   FEV1% 38 %   FEV1/FVC Ratio 44     Breath   Shortness of Breath Yes      Exercise Target Goals:    Exercise Program Goal: Individual exercise prescription set with THRR, safety & activity barriers. Participant demonstrates ability to understand and report RPE using BORG scale, to self-measure pulse accurately, and to acknowledge the importance of the exercise prescription.  Exercise Prescription Goal: Starting with aerobic activity 30 plus minutes a day, 3 days per week for initial exercise prescription. Provide home exercise prescription and guidelines that participant acknowledges understanding prior to discharge.  Activity Barriers & Risk Stratification:     Activity Barriers &  Cardiac Risk Stratification - 04/01/16 1046      Activity Barriers & Cardiac Risk Stratification   Activity Barriers Back Problems   Cardiac  Risk Stratification High  Patient has had MI in past 02/08/2014      6 Minute Walk:     6 Minute Walk    Row Name 04/01/16 1150         6 Minute Walk   Phase Initial     Distance 1200 feet     Distance % Change 0 %     Walk Time 6 minutes     # of Rest Breaks 0     MPH 2.27     METS 2.74     RPE 13     Perceived Dyspnea  14     VO2 Peak 7.02     Symptoms No     Resting HR 68 bpm     Resting BP 94/62     Max Ex. HR 83 bpm     Max Ex. BP 120/64     2 Minute Post BP 104/62        Initial Exercise Prescription:     Initial Exercise Prescription - 04/01/16 1100      Date of Initial Exercise RX and Referring Provider   Date 04/01/16   Referring Provider Dr. Chase Caller     Treadmill   MPH 1.2   Grade 0   Minutes 15   METs 1.9     NuStep   Level 2   Watts 12   Minutes 20   METs 1.7     Prescription Details   Frequency (times per week) 2   Duration Progress to 30 minutes of continuous aerobic without signs/symptoms of physical distress     Intensity   THRR REST +  20   THRR 40-80% of Max Heartrate [id number]   Ratings of Perceived Exertion 11-13   Perceived Dyspnea 0-4     Progression   Progression Continue progressive overload as per policy without signs/symptoms or physical distress.     Resistance Training   Training Prescription Yes   Weight 1   Reps 10-12      Perform Capillary Blood Glucose checks as needed.  Exercise Prescription Changes:      Exercise Prescription Changes    Row Name 04/13/16 1200 05/06/16 1500 06/07/16 0800         Exercise Review   Progression No Yes Yes       Response to Exercise   Blood Pressure (Admit) 108/70 112/62 98/56     Blood Pressure (Exercise) 132/74 138/70 118/72     Blood Pressure (Exit) 98/60 102/56 110/64     Heart Rate (Admit) 66 bpm 60 bpm 83 bpm      Heart Rate (Exercise) 64 bpm 91 bpm 95 bpm     Heart Rate (Exit) 43 bpm 95 bpm 90 bpm     Oxygen Saturation (Admit) 94 % 94 % 94 %     Oxygen Saturation (Exercise) 92 % 91 % 92 %     Oxygen Saturation (Exit) 96 % 91 % 95 %     Rating of Perceived Exertion (Exercise) 13 11 12      Perceived Dyspnea (Exercise) 13 11 10      Duration Progress to 30 minutes of continuous aerobic without signs/symptoms of physical distress Progress to 30 minutes of continuous aerobic without signs/symptoms of physical distress Progress to 30 minutes of continuous aerobic without signs/symptoms of physical distress     Intensity Rest + 20 Rest + 20 Rest + 20       Progression   Progression Continue progressive  overload as per policy without signs/symptoms or physical distress. Continue progressive overload as per policy without signs/symptoms or physical distress. Continue progressive overload as per policy without signs/symptoms or physical distress.       Resistance Training   Training Prescription Yes Yes Yes     Weight 1 2 3      Reps 10-12 10-12 10-12       Treadmill   MPH 1.2 1.6 1.7     Grade 0 0 0     Minutes 15 15 15      METs 1.9 2.2 2.3       NuStep   Level 2 3 3      Watts 11 16 15      Minutes 20 20 20      METs 1.8 3.52 3.48       Home Exercise Plan   Plans to continue exercise at [facility]     Frequency Add 2 additional days to program exercise sessions. Add 2 additional days to program exercise sessions. Add 2 additional days to program exercise sessions.        Exercise Comments:      Exercise Comments    Row Name 04/13/16 1256 05/06/16 1512 06/07/16 0847       Exercise Comments Patient has just started pulmonary rehab and will be progressed as needed.  Patient is progressing appropriately  Patient is proggressing appropriately        Discharge Exercise Prescription (Final Exercise Prescription Changes):     Exercise Prescription Changes - 06/07/16 0800      Exercise  Review   Progression Yes     Response to Exercise   Blood Pressure (Admit) 98/56   Blood Pressure (Exercise) 118/72   Blood Pressure (Exit) 110/64   Heart Rate (Admit) 83 bpm   Heart Rate (Exercise) 95 bpm   Heart Rate (Exit) 90 bpm   Oxygen Saturation (Admit) 94 %   Oxygen Saturation (Exercise) 92 %   Oxygen Saturation (Exit) 95 %   Rating of Perceived Exertion (Exercise) 12   Perceived Dyspnea (Exercise) 10   Duration Progress to 30 minutes of continuous aerobic without signs/symptoms of physical distress   Intensity Rest + 20     Progression   Progression Continue progressive overload as per policy without signs/symptoms or physical distress.     Resistance Training   Training Prescription Yes   Weight 3   Reps 10-12     Treadmill   MPH 1.7   Grade 0   Minutes 15   METs 2.3     NuStep   Level 3   Watts 15   Minutes 20   METs 3.48     Home Exercise Plan   Plans to continue exercise at Home   Frequency Add 2 additional days to program exercise sessions.      Nutrition:  Target Goals: Understanding of nutrition guidelines, daily intake of sodium 1500mg , cholesterol 200mg , calories 30% from fat and 7% or less from saturated fats, daily to have 5 or more servings of fruits and vegetables.  Biometrics:     Pre Biometrics - 04/01/16 1152      Pre Biometrics   Height 5\' 8"  (1.727 m)   Weight 170 lb 3.1 oz (77.2 kg)   Waist Circumference 41.5 inches   Hip Circumference 38.5 inches   Waist to Hip Ratio 1.08 %   BMI (Calculated) 25.9   Triceps Skinfold 7 mm   % Body Fat 25 %  Grip Strength 90 kg   Flexibility 0 in   Single Leg Stand 5 seconds       Nutrition Therapy Plan and Nutrition Goals:   Nutrition Discharge: Rate Your Plate Scores:     Nutrition Assessments - 04/01/16 1127      Rate Your Plate Scores   Pre Score 40      Psychosocial: Target Goals: Acknowledge presence or absence of depression, maximize coping skills, provide positive  support system. Participant is able to verbalize types and ability to use techniques and skills needed for reducing stress and depression.  Initial Review & Psychosocial Screening:     Initial Psych Review & Screening - 04/01/16 1133      Initial Review   Current issues with --  Not being able to do what he use to do.     Family Dynamics   Good Support System? Yes     Barriers   Psychosocial barriers to participate in program There are no identifiable barriers or psychosocial needs.     Screening Interventions   Interventions Encouraged to exercise      Quality of Life Scores:     Quality of Life - 04/01/16 1153      Quality of Life Scores   Health/Function Pre 17.09 %   Socioeconomic Pre 22.5 %   Psych/Spiritual Pre 22.5 %   Family Pre 24 %   GLOBAL Pre 20.18 %      PHQ-9: Recent Review Flowsheet Data    Depression screen [facility] 2/9 04/01/2016   Decreased Interest 0   Down, Depressed, Hopeless 0   PHQ - 2 Score 0   Altered sleeping 1   Tired, decreased energy 1   Change in appetite 0   Feeling bad or failure about yourself  0   Trouble concentrating 0   Moving slowly or fidgety/restless 0   Suicidal thoughts 0   PHQ-9 Score 2   Difficult doing work/chores Not difficult at all      Psychosocial Evaluation and Intervention:     Psychosocial Evaluation - 04/01/16 1134      Psychosocial Evaluation & Interventions   Interventions --  No referral needed.    Continued Psychosocial Services Needed No      Psychosocial Re-Evaluation:     Psychosocial Re-Evaluation    [place] Name 05/12/16 1450 06/09/16 1348           Psychosocial Re-Evaluation   Interventions Encouraged to attend Pulmonary Rehabilitation for the exercise Encouraged to attend Pulmonary Rehabilitation for the exercise      Comments Patient's QOL score was 20.18 and his PHQ-9 score was 2. He continues to have no psychosocial issues identified.  Patient's QOL score was WNL at 20.18. He  continue to have no psychosocial issues identified.       Continued Psychosocial Services Needed No No         Education: Education Goals: Education classes will be provided on a weekly basis, covering required topics. Participant will state understanding/return demonstration of topics presented.  Learning Barriers/Preferences:     Learning Barriers/Preferences - 04/01/16 1048      Learning Barriers/Preferences   Learning Barriers Hearing   Learning Preferences Skilled Demonstration;Individual Instruction;Group Instruction      Education Topics: How Lungs Work and Diseases: - Discuss the anatomy of the lungs and diseases that can affect the lungs, such as COPD.   Exercise: -Discuss the importance of exercise, FITT principles of exercise, normal and abnormal responses to exercise, and  how to exercise safely. Flowsheet Row PULMONARY REHAB CHRONIC OBSTRUCTIVE PULMONARY DISEASE from 05/20/2016 in [place]  Date  (P) 04/08/16  Educator  (P) [place]  Instruction Review Code  (P) 2- meets goals/outcomes      Environmental Irritants: -Discuss types of environmental irritants and how to limit exposure to environmental irritants. Flowsheet Row PULMONARY REHAB CHRONIC OBSTRUCTIVE PULMONARY DISEASE from 05/20/2016 in [place]  Date  (P) 04/15/16  Educator  (P) Nils Flack  Instruction Review Code  (P) 2- meets goals/outcomes      Meds/Inhalers and oxygen: - Discuss respiratory medications, definition of an inhaler and oxygen, and the proper way to use an inhaler and oxygen.   Energy Saving Techniques: - Discuss methods to conserve energy and decrease shortness of breath when performing activities of daily living.  Flowsheet Row PULMONARY REHAB CHRONIC OBSTRUCTIVE PULMONARY DISEASE from 05/20/2016 in [place]  Date  (P) 04/29/16  Educator  (P) Suzanne Boron  Instruction Review Code  (P) 2- meets goals/outcomes       Bronchial Hygiene / Breathing Techniques: - Discuss breathing mechanics, pursed-lip breathing technique,  proper posture, effective ways to clear airways, and other functional breathing techniques Flowsheet Row PULMONARY REHAB CHRONIC OBSTRUCTIVE PULMONARY DISEASE from 05/20/2016 in [place]  Date  (P) 05/06/16  Educator  (P) Nils Flack  Instruction Review Code  (P) 2- meets goals/outcomes      Cleaning Equipment: - Provides group verbal and written instruction about the health risks of elevated stress, cause of high stress, and healthy ways to reduce stress.   Nutrition I: Fats: - Discuss the types of cholesterol, what cholesterol does to the body, and how cholesterol levels can be controlled. Flowsheet Row PULMONARY REHAB CHRONIC OBSTRUCTIVE PULMONARY DISEASE from 05/20/2016 in [place]  Date  (P) 05/13/16  Educator  (P) Lisabeth Register  Instruction Review Code  (P) 2- meets goals/outcomes      Nutrition II: Labels: -Discuss the different components of food labels and how to read food labels.   Respiratory Infections: - Discuss the signs and symptoms of respiratory infections, ways to prevent respiratory infections, and the importance of seeking medical treatment when having a respiratory infection.   Stress I: Signs and Symptoms: - Discuss the causes of stress, how stress may lead to anxiety and depression, and ways to limit stress.   Stress II: Relaxation: -Discuss relaxation techniques to limit stress.   Oxygen for Home/Travel: - Discuss how to prepare for travel when on oxygen and proper ways to transport and store oxygen to ensure safety.   Knowledge Questionnaire Score:     Knowledge Questionnaire Score - 04/01/16 1111      Knowledge Questionnaire Score   Pre Score 9/14      Core Components/Risk Factors/Patient Goals at Admission:     Personal Goals and Risk Factors at Admission - 04/01/16 1129       Core Components/Risk Factors/Patient Goals on Admission    Weight Management Obesity   Increase Strength and Stamina Yes   Intervention Provide advice, education, support and counseling about physical activity/exercise needs.;Develop an individualized exercise prescription for aerobic and resistive training based on initial evaluation findings, risk stratification, comorbidities and participant's personal goals.   Expected Outcomes Achievement of increased cardiorespiratory fitness and enhanced flexibility, muscular endurance and strength shown through measurements of functional capacity and personal statement of participant.   Improve shortness of breath with ADL's Yes   Intervention  Provide education, individualized exercise plan and daily activity instruction to help decrease symptoms of SOB with activities of daily living.   Expected Outcomes Short Term: Achieves a reduction of symptoms when performing activities of daily living.   Develop more efficient breathing techniques such as purse lipped breathing and diaphragmatic breathing; and practicing self-pacing with activity Yes   Intervention Provide education, demonstration and support about specific breathing techniuqes utilized for more efficient breathing. Include techniques such as pursed lipped breathing, diaphragmatic breathing and self-pacing activity.   Expected Outcomes Short Term: Participant will be able to demonstrate and use breathing techniques as needed throughout daily activities.   Personal Goal Other Yes   Personal Goal To be able to do what he use to do without SOB. Walk outdoors more, and be more active in general.    Intervention Attend PR 2 x week and supplement his exercise 3 x week at home.    Expected Outcomes Reach personal goals.       Core Components/Risk Factors/Patient Goals Review:      Goals and Risk Factor Review    Row Name 04/01/16 1132 05/12/16 1448 06/09/16 1345         Core Components/Risk  Factors/Patient Goals Review   Personal Goals Review Weight Management/Obesity;Increase Strength and Stamina;Improve shortness of breath with ADL's;Develop more efficient breathing techniques such as purse lipped breathing and diaphragmatic breathing and practicing self-pacing with activity. Weight Management/Obesity;Increase Strength and Stamina;Improve shortness of breath with ADL's;Develop more efficient breathing techniques such as purse lipped breathing and diaphragmatic breathing and practicing self-pacing with activity.  To be able to do what he use to do without SOB. Walk outdoors more, and be more active in general.  Weight Management/Obesity;Increase Strength and Stamina;Improve shortness of breath with ADL's;Develop more efficient breathing techniques such as purse lipped breathing and diaphragmatic breathing and practicing self-pacing with activity.  To be able to do what he use to do without SOB. Walk outdoors more, and be more active in general.      Review  - Patient has attended 9 sessions with some progress and some increased strength and stamina. He has maintained his weight. Will continue to monitor for progress.  Patient has attended 13 sessions. His last session attended was 05/25/16. He has not been able to come d/t COPD excerbation. He has progressed well in the program with some improvement in his strength and stamina and SOB. He has lost 10 lbs since starting the program.      Expected Outcomes  - Patient will continue to attend sessions meeting his personal goals.  Patient will continue to attend sessions and complete the program meeting his personal goals.         Core Components/Risk Factors/Patient Goals at Discharge (Final Review):      Goals and Risk Factor Review - 06/09/16 1345      Core Components/Risk Factors/Patient Goals Review   Personal Goals Review Weight Management/Obesity;Increase Strength and Stamina;Improve shortness of breath with ADL's;Develop more  efficient breathing techniques such as purse lipped breathing and diaphragmatic breathing and practicing self-pacing with activity.  To be able to do what he use to do without SOB. Walk outdoors more, and be more active in general.    Review Patient has attended 13 sessions. His last session attended was 05/25/16. He has not been able to come d/t COPD excerbation. He has progressed well in the program with some improvement in his strength and stamina and SOB. He has lost 10 lbs since starting  the program.    Expected Outcomes Patient will continue to attend sessions and complete the program meeting his personal goals.       ITP Comments:     ITP Comments    Row Name 04/01/16 1120 04/14/16 1418         ITP Comments Mr. Barela is an 83 yr old gentleman that was here for his initial visit. He attended the basic heart healthy group nutrition class today 04/01/16 with dietician. He went over principles of rating your plate and how to plan and eat a heart healthy meal. The importance of keeping your weight down, how to choose healthy meals while dining out.  Patient new to program. Has attended 3 sessions. Will continue to monitor for progress.          Comments: ITP REVIEW Pt is making expected progress toward pulmonary rehab goals after completing 13 sessions. Recommend continued exercise, life style modification, education, and utilization of breathing techniques to increase stamina and strength and decrease shortness of breath with exertion.

## 2016-06-09 NOTE — Progress Notes (Signed)
Pulmonary Rehab Psychosocial 90 Day Reassessment   Patient psychosocial assessment reveals no barriers to participation in Pulmonary Rehab. Psychosocial areas that are currently affecting patient's rehab experience include concerns about health issues. Patient does continue to exhibit positive coping skills to deal with his psychosocial concerns. Offered emotional support and reassurance. Patient does feel he is making some  progress toward Pulmonary Rehab goals. Patient reports his health and activity level has improved in the past 30 days as evidenced by patient's report of increased ability to walk more and do some outside activities. Patient states family/friends have noticed changes in his activity or mood. Patient reports  feeling positive about current and projected progression in Pulmonary Rehab. After reviewing the patient's treatment plan, the patient is making progress toward Pulmonary Rehab goals. Patient's rate of progress toward rehab goals is good. Plan of action to help patient continue to work towards rehab goals include continuing to attend Pulmonary Rehab sessions. Will continue to monitor and evaluate progress toward psychosocial goal(s).  Goal(s) in progress:  Improved coping skills Help patient work toward returning to meaningful activities that improve patient's QOL and are attainable with patient's lung disease

## 2016-06-10 ENCOUNTER — Encounter (HOSPITAL_COMMUNITY)
Admission: RE | Admit: 2016-06-10 | Discharge: 2016-06-10 | Disposition: A | Discharge status: Home / Self Care | Payer: Medicare Other | Source: Ambulatory Visit | Attending: Internal Medicine | Admitting: Internal Medicine

## 2016-06-10 NOTE — Progress Notes (Signed)
Jon White presents to pulmonary rehab for his/ bi-weekly exercise session. I have completed his/ thirty day face to face review and determined that [name] is/on track for meeting their pulmonary rehab goals. There are/ barriers identified that will prevent them from continuing their exercise in pulmonary rehab as prescribed. He is having a significant COPD exacerbation and has not been able to participate for the last several sessions but he thinks he will be able to participate starting on the 15th again. He has done well other than his COPD exacerbation. I have reviewed all of his nodes and he is progressing. He feels strongly that this has helped him.

## 2016-06-15 ENCOUNTER — Encounter (HOSPITAL_COMMUNITY)
Admission: RE | Admit: 2016-06-15 | Discharge: 2016-06-15 | Disposition: A | Discharge status: Home / Self Care | Payer: Medicare Other | Source: Ambulatory Visit | Attending: Internal Medicine | Admitting: Internal Medicine

## 2016-06-15 DIAGNOSIS — J449 Chronic obstructive pulmonary disease, unspecified: Secondary | ICD-10-CM | POA: Diagnosis not present

## 2016-06-15 NOTE — Progress Notes (Signed)
Daily Session Note  Patient Details  Name: Jon White MRN: [id number] Date of Birth: 1932-08-12 Referring Provider:   Flowsheet Row PULMONARY REHAB COPD ORIENTATION from 04/01/2016 in [place]  Referring Provider  Dr. Chase Caller      Encounter Date: 06/15/2016  Check In:     Session Check In - 06/15/16 1330      Check-In   Location [facility]   Staff Present Diane Angelina Pih, MS, EP, [facility], Exercise Physiologist;Jahzir Strohmeier Luther Parody, BS, EP, Exercise Physiologist   Supervising physician immediately available to respond to emergencies See telemetry face sheet for immediately available MD   Medication changes reported     No   Fall or balance concerns reported    No   Warm-up and Cool-down Performed as group-led instruction   Resistance Training Performed Yes   VAD Patient? No     Pain Assessment   Currently in Pain? No/denies   Pain Score 0-No pain   Multiple Pain Sites No      Capillary Blood Glucose: No results found for this or any previous visit (from the past 24 hour(s)).   Goals Met:  Independence with exercise equipment Improved SOB with ADL's Using PLB without cueing & demonstrates good technique Exercise tolerated well No report of cardiac concerns or symptoms Strength training completed today  Goals Unmet:  Not Applicable  Comments: Check out 230   Dr. Sinda Du is Medical Director for [facility] Pulmonary Rehab.

## 2016-06-17 ENCOUNTER — Encounter (HOSPITAL_COMMUNITY): Payer: Medicare Other

## 2016-06-22 ENCOUNTER — Encounter (HOSPITAL_COMMUNITY)
Admission: RE | Admit: 2016-06-22 | Discharge: 2016-06-22 | Disposition: A | Discharge status: Home / Self Care | Payer: Medicare Other | Source: Ambulatory Visit | Attending: Internal Medicine | Admitting: Internal Medicine

## 2016-06-22 DIAGNOSIS — J449 Chronic obstructive pulmonary disease, unspecified: Secondary | ICD-10-CM | POA: Diagnosis not present

## 2016-06-22 NOTE — Progress Notes (Signed)
Daily Session Note  Patient Details  Name: Jon White MRN: [id number] Date of Birth: [birth year] Referring Provider:   Flowsheet Row PULMONARY REHAB COPD ORIENTATION from 04/01/2016 in [place]  Referring Provider  Dr. Chase Caller      Encounter Date: 06/22/2016  Check In:     Session Check In - 06/22/16 1330      Check-In   Location [facility]   Staff Present Diane Angelina Pih, MS, EP, [facility], Exercise Physiologist;Ismar Yabut Luther Parody, BS, EP, Exercise Physiologist   Supervising physician immediately available to respond to emergencies See telemetry face sheet for immediately available MD   Medication changes reported     No   Fall or balance concerns reported    No   Warm-up and Cool-down Performed as group-led instruction   Resistance Training Performed Yes   VAD Patient? No     Pain Assessment   Currently in Pain? No/denies   Pain Score 0-No pain   Multiple Pain Sites No      Capillary Blood Glucose: No results found for this or any previous visit (from the past 24 hour(s)).   Goals Met:  Independence with exercise equipment Improved SOB with ADL's Using PLB without cueing & demonstrates good technique Exercise tolerated well No report of cardiac concerns or symptoms Strength training completed today  Goals Unmet:  Not Applicable  Comments: Check out 230   Dr. Sinda Du is Medical Director for [facility] Pulmonary Rehab.

## 2016-06-24 ENCOUNTER — Encounter (HOSPITAL_COMMUNITY)
Admission: RE | Admit: 2016-06-24 | Discharge: 2016-06-24 | Disposition: A | Discharge status: Home / Self Care | Payer: Medicare Other | Source: Ambulatory Visit | Attending: Internal Medicine | Admitting: Internal Medicine

## 2016-06-24 DIAGNOSIS — J449 Chronic obstructive pulmonary disease, unspecified: Secondary | ICD-10-CM | POA: Diagnosis not present

## 2016-06-24 NOTE — Progress Notes (Signed)
Daily Session Note  Patient Details  Name: Jon White MRN: [id number] Date of Birth: 01-Nov-1932 Referring Provider:   Flowsheet Row PULMONARY REHAB COPD ORIENTATION from 04/01/2016 in [place]  Referring Provider  Dr. Chase Caller      Encounter Date: 06/24/2016  Check In:     Session Check In - 06/24/16 1330      Check-In   Location [facility]   Staff Present Aundra Dubin, RN, BSN;Larra Crunkleton Luther Parody, BS, EP, Exercise Physiologist   Supervising physician immediately available to respond to emergencies See telemetry face sheet for immediately available MD   Medication changes reported     No   Fall or balance concerns reported    No   Warm-up and Cool-down Performed as group-led instruction   Resistance Training Performed Yes   VAD Patient? No     Pain Assessment   Currently in Pain? No/denies   Pain Score 0-No pain   Multiple Pain Sites No      Capillary Blood Glucose: No results found for this or any previous visit (from the past 24 hour(s)).   Goals Met:  Independence with exercise equipment Improved SOB with ADL's Using PLB without cueing & demonstrates good technique Exercise tolerated well No report of cardiac concerns or symptoms Strength training completed today  Goals Unmet:  Not Applicable  Comments: Check out 230   Dr. Sinda Du is Medical Director for [facility] Pulmonary Rehab.

## 2016-06-29 ENCOUNTER — Encounter (HOSPITAL_COMMUNITY)
Admission: RE | Admit: 2016-06-29 | Discharge: 2016-06-29 | Disposition: A | Discharge status: Home / Self Care | Payer: Medicare Other | Source: Ambulatory Visit | Attending: Internal Medicine | Admitting: Internal Medicine

## 2016-06-29 DIAGNOSIS — J449 Chronic obstructive pulmonary disease, unspecified: Secondary | ICD-10-CM

## 2016-06-29 NOTE — Progress Notes (Signed)
Daily Session Note  Patient Details  Name: Jon White MRN: [id number] Date of Birth: [birth year] Referring Provider:   Flowsheet Row PULMONARY REHAB COPD ORIENTATION from 04/01/2016 in [place]  Referring Provider  Dr. Chase Caller      Encounter Date: 06/29/2016  Check In:     Session Check In - 06/29/16 1330      Check-In   Location [facility]   Staff Present Suzanne Boron, BS, EP, Exercise Physiologist   Supervising physician immediately available to respond to emergencies See telemetry face sheet for immediately available MD   Medication changes reported     No   Fall or balance concerns reported    No   Warm-up and Cool-down Performed as group-led instruction   Resistance Training Performed Yes   VAD Patient? No     Pain Assessment   Currently in Pain? No/denies   Pain Score 0-No pain   Multiple Pain Sites No      Capillary Blood Glucose: No results found for this or any previous visit (from the past 24 hour(s)).   Goals Met:  Independence with exercise equipment Improved SOB with ADL's Using PLB without cueing & demonstrates good technique Exercise tolerated well No report of cardiac concerns or symptoms Strength training completed today  Goals Unmet:  Not Applicable  Comments: Check out 230   Dr. Sinda Du is Medical Director for [facility] Pulmonary Rehab.

## 2016-07-01 ENCOUNTER — Encounter (HOSPITAL_COMMUNITY)
Admission: RE | Admit: 2016-07-01 | Discharge: 2016-07-01 | Disposition: A | Discharge status: Home / Self Care | Payer: Medicare Other | Source: Ambulatory Visit | Attending: Internal Medicine | Admitting: Internal Medicine

## 2016-07-01 DIAGNOSIS — J449 Chronic obstructive pulmonary disease, unspecified: Secondary | ICD-10-CM | POA: Diagnosis not present

## 2016-07-06 ENCOUNTER — Encounter (HOSPITAL_COMMUNITY)
Admission: RE | Admit: 2016-07-06 | Discharge: 2016-07-06 | Disposition: A | Discharge status: Home / Self Care | Payer: Medicare Other | Source: Ambulatory Visit | Attending: Internal Medicine | Admitting: Internal Medicine

## 2016-07-06 DIAGNOSIS — J449 Chronic obstructive pulmonary disease, unspecified: Secondary | ICD-10-CM

## 2016-07-06 NOTE — Progress Notes (Signed)
Daily Session Note  Patient Details  Name: Jon White MRN: [id number] Date of Birth: Jun 10, 1932 Referring Provider:   Flowsheet Row PULMONARY REHAB COPD ORIENTATION from 04/01/2016 in [place]  Referring Provider  Dr. Chase Caller      Encounter Date: 07/06/2016  Check In:     Session Check In - 07/06/16 1300      Check-In   Location [facility]   Staff Present Diane Angelina Pih, MS, EP, [facility], Exercise Physiologist;Markel Mergenthaler Luther Parody, BS, EP, Exercise Physiologist   Supervising physician immediately available to respond to emergencies See telemetry face sheet for immediately available MD   Medication changes reported     No   Fall or balance concerns reported    No   Warm-up and Cool-down Performed as group-led instruction   Resistance Training Performed Yes   VAD Patient? No     Pain Assessment   Currently in Pain? No/denies   Pain Score 0-No pain   Multiple Pain Sites No      Capillary Blood Glucose: No results found for this or any previous visit (from the past 24 hour(s)).      Exercise Prescription Changes - 07/06/16 1200      Exercise Review   Progression Yes     Response to Exercise   Blood Pressure (Admit) 100/56   Blood Pressure (Exercise) 116/60   Blood Pressure (Exit) 152/84   Heart Rate (Admit) 64 bpm   Heart Rate (Exercise) 92 bpm   Heart Rate (Exit) 83 bpm   Oxygen Saturation (Admit) 93 %   Oxygen Saturation (Exercise) 90 %   Oxygen Saturation (Exit) 91 %   Rating of Perceived Exertion (Exercise) 11   Perceived Dyspnea (Exercise) 12   Duration Progress to 30 minutes of continuous aerobic without signs/symptoms of physical distress   Intensity Rest + 20     Progression   Progression Continue progressive overload as per policy without signs/symptoms or physical distress.     Resistance Training   Training Prescription Yes   Weight 3   Reps 10-12     Treadmill   MPH 2.3   Grade 0   Minutes 15   METs 2.5      NuStep   Level 3   Watts 17   Minutes 20   METs 3.51     Home Exercise Plan   Plans to continue exercise at Home   Frequency Add 2 additional days to program exercise sessions.     Goals Met:  Independence with exercise equipment Improved SOB with ADL's Using PLB without cueing & demonstrates good technique Exercise tolerated well No report of cardiac concerns or symptoms Strength training completed today  Goals Unmet:  Not Applicable  Comments: Check out 230   Dr. Sinda Du is Medical Director for [facility] Pulmonary Rehab.

## 2016-07-08 ENCOUNTER — Encounter (HOSPITAL_COMMUNITY)
Admission: RE | Admit: 2016-07-08 | Discharge: 2016-07-08 | Disposition: A | Discharge status: Home / Self Care | Payer: Medicare Other | Source: Ambulatory Visit | Attending: Internal Medicine | Admitting: Internal Medicine

## 2016-07-08 DIAGNOSIS — J449 Chronic obstructive pulmonary disease, unspecified: Secondary | ICD-10-CM | POA: Diagnosis not present

## 2016-07-08 NOTE — Progress Notes (Signed)
Pulmonary Individual Treatment Plan  Patient Details  Name: Jon White MRN: [id number] Date of Birth: [birth year] Referring Provider:   Flowsheet Row PULMONARY REHAB COPD ORIENTATION from 04/01/2016 in [place]  Referring Provider  Dr. Chase Caller      Initial Encounter Date:  Flowsheet Row PULMONARY REHAB COPD ORIENTATION from 04/01/2016 in [place]  Date  04/01/16  Referring Provider  Dr. Chase Caller      Visit Diagnosis: Stage 2 moderate COPD by GOLD classification ([place])  Patient's Home Medications on Admission:   Current Outpatient Prescriptions:  .  Aclidinium Bromide (TUDORZA PRESSAIR) 400 MCG/ACT AEPB, Inhale 1 puff into the lungs 2 (two) times daily., Disp: 1 each, Rfl: 5 .  albuterol (VENTOLIN HFA) 108 (90 BASE) MCG/ACT inhaler, Inhale 2 puffs into the lungs every 6 (six) hours as needed for wheezing or shortness of breath., Disp: 1 Inhaler, Rfl: 2 .  apixaban (ELIQUIS) 5 MG TABS tablet, Take 1 tablet (5 mg total) by mouth 2 (two) times daily., Disp: 180 tablet, Rfl: 3 .  atorvastatin (LIPITOR) 10 MG tablet, Take 10 mg by mouth daily at 6 PM. , Disp: , Rfl:  .  budesonide-formoterol (SYMBICORT) 160-4.5 MCG/ACT inhaler, Inhale 2 puffs into the lungs 2 (two) times daily., Disp: 2 Inhaler, Rfl: 0 .  esomeprazole (NEXIUM) 40 MG capsule, Take 1 capsule (40 mg total) by mouth daily., Disp: 90 capsule, Rfl: 6 .  fluocinonide cream (LIDEX) [id number] %, Apply 1 application topically daily as needed (rash). To infected area., Disp: , Rfl:  .  fluticasone (FLONASE) 50 MCG/ACT nasal spray, Place 2 sprays into the nose at bedtime. Each nostril once a day., Disp: , Rfl:  .  furosemide (LASIX) 40 MG tablet, TAKE 1 TABLET(40 MG) BY MOUTH DAILY, Disp: 90 tablet, Rfl: 2 .  metoprolol tartrate (LOPRESSOR) 25 MG tablet, TAKE 1/2 TABLET BY MOUTH TWICE DAILY, Disp: 30 tablet, Rfl: 5 .  nitroGLYCERIN (NITROSTAT) 0.4 MG SL tablet, Place 1 tablet (0.4 mg  total) under the tongue every 5 (five) minutes as needed for chest pain., Disp: 75 tablet, Rfl: 3 .  phenol (CHLORASEPTIC) 1.4 % LIQD, Use as directed 1 spray in the mouth or throat as needed for throat irritation / pain., Disp: , Rfl:  .  tamsulosin (FLOMAX) 0.4 MG CAPS capsule, Take 0.4 mg by mouth daily., Disp: , Rfl:   Past Medical History: Past Medical History:  Diagnosis Date  . Allergic rhinitis   . Allergy   . Asthma   . Atrial fibrillation ([place])   . Bleeding gastric ulcer   . Blood transfusion without reported diagnosis   . Cataract    bilateral removed  . Chronic renal insufficiency, stage III (moderate)   . COPD (chronic obstructive pulmonary disease) ([place])   . Diabetes mellitus without complication ([place])    no meds- boarderline  . Duodenum ulcer    with perforation  . DVT of axillary vein, acute left ([place]) 10/24/2014  . Emphysema   . GERD (gastroesophageal reflux disease)   . H/O hiatal hernia   . High cholesterol   . Hypertension   . Mixed hyperlipidemia   . Myocardial infarction   . Neuromuscular disorder ([place])    reports shaking, somedays "can't hold spoon" - generalized  shakiness, is going to talk to MD about it at next appt.   . Normal cardiac stress test    reports stress test was several yrs. ago /w [employer]. Dr. Rogelia Mire, WNL   .  OSA (obstructive sleep apnea) 2007   CPAP- report of settings on paper chart, seen by [facility] - seen 07/2011   . PE (pulmonary embolism) 10/2011  . Prostate cancer ([place]) 1992   in remission.  s/p XRT, melanoma under left eye, left temple  . Recurrent upper respiratory infection (URI)    coughing from chronic sinusitis   . Renal stone    some passed spontaneous, & also had cystoscopy  . Shortness of breath on exertion   . Sleep apnea    wears c-pap    Tobacco Use: History  Smoking Status  . Former Smoker  . Packs/day: 1.00  . Years: 60.00  . Types: Cigarettes  . Quit date: 08/06/2011  Smokeless Tobacco  . Never Used     Labs: Recent Review Flowsheet Data    Labs for ITP Cardiac and Pulmonary Rehab Latest Ref Rng & Units 08/28/2013 08/31/2013   Trlycerides <150 mg/dL 56 -   Hemoglobin A1c <5.7 % - 6.1(H)   PHART 7.350 - 7.450 7.347(L) -   PCO2ART 35.0 - 45.0 mmHg 46.0(H) -   HCO3 20.0 - 24.0 mEq/L 25.3(H) -   TCO2 0 - 100 mmol/L 27 -   ACIDBASEDEF 0.0 - 2.0 mmol/L 1.0 -   O2SAT % 99.0 -      Capillary Blood Glucose: Lab Results  Component Value Date   GLUCAP 137 (H) 08/30/2013   GLUCAP 111 (H) 08/30/2013   GLUCAP 131 (H) 08/30/2013   GLUCAP 138 (H) 08/30/2013   GLUCAP 136 (H) 08/29/2013     ADL UCSD:     Pulmonary Assessment Scores    Row Name 04/01/16 1118         ADL UCSD   ADL Phase Entry     SOB Score total 40     Rest 1     Walk 6     Stairs 1     Bath 2     Dress 2     Shop 3       CAT Score   CAT Score 22       mMRC Score   mMRC Score 3        Pulmonary Function Assessment:     Pulmonary Function Assessment - 04/01/16 1112      Pulmonary Function Tests   FVC% 70 %   FEV1% 42 %   FEV1/FVC Ratio 39   RV% 140 %   DLCO% 56 %     Initial Spirometry Results   FVC% 70 %   FEV1% 42 %   FEV1/FVC Ratio 39     Post Bronchodilator Spirometry Results   FVC% 25 %   FEV1% 38 %   FEV1/FVC Ratio 44     Breath   Shortness of Breath Yes      Exercise Target Goals:    Exercise Program Goal: Individual exercise prescription set with THRR, safety & activity barriers. Participant demonstrates ability to understand and report RPE using BORG scale, to self-measure pulse accurately, and to acknowledge the importance of the exercise prescription.  Exercise Prescription Goal: Starting with aerobic activity 30 plus minutes a day, 3 days per week for initial exercise prescription. Provide home exercise prescription and guidelines that participant acknowledges understanding prior to discharge.  Activity Barriers & Risk Stratification:     Activity Barriers &  Cardiac Risk Stratification - 04/01/16 1046      Activity Barriers & Cardiac Risk Stratification   Activity Barriers Back Problems   Cardiac  Risk Stratification High  Patient has had MI in past 02/08/2014      6 Minute Walk:     6 Minute Walk    Row Name 04/01/16 1150         6 Minute Walk   Phase Initial     Distance 1200 feet     Distance % Change 0 %     Walk Time 6 minutes     # of Rest Breaks 0     MPH 2.27     METS 2.74     RPE 13     Perceived Dyspnea  14     VO2 Peak 7.02     Symptoms No     Resting HR 68 bpm     Resting BP 94/62     Max Ex. HR 83 bpm     Max Ex. BP 120/64     2 Minute Post BP 104/62        Initial Exercise Prescription:     Initial Exercise Prescription - 04/01/16 1100      Date of Initial Exercise RX and Referring Provider   Date 04/01/16   Referring Provider Dr. Chase Caller     Treadmill   MPH 1.2   Grade 0   Minutes 15   METs 1.9     NuStep   Level 2   Watts 12   Minutes 20   METs 1.7     Prescription Details   Frequency (times per week) 2   Duration Progress to 30 minutes of continuous aerobic without signs/symptoms of physical distress     Intensity   THRR REST +  20   THRR 40-80% of Max Heartrate [id number]   Ratings of Perceived Exertion 11-13   Perceived Dyspnea 0-4     Progression   Progression Continue progressive overload as per policy without signs/symptoms or physical distress.     Resistance Training   Training Prescription Yes   Weight 1   Reps 10-12      Perform Capillary Blood Glucose checks as needed.  Exercise Prescription Changes:      Exercise Prescription Changes    Row Name 04/13/16 1200 05/06/16 1500 06/07/16 0800 07/06/16 1200       Exercise Review   Progression No Yes Yes Yes      Response to Exercise   Blood Pressure (Admit) 108/70 112/62 98/56 100/56    Blood Pressure (Exercise) 132/74 138/70 118/72 116/60    Blood Pressure (Exit) 98/60 102/56 110/64 152/84    Heart Rate  (Admit) 66 bpm 60 bpm 83 bpm 64 bpm    Heart Rate (Exercise) 64 bpm 91 bpm 95 bpm 92 bpm    Heart Rate (Exit) 43 bpm 95 bpm 90 bpm 83 bpm    Oxygen Saturation (Admit) 94 % 94 % 94 % 93 %    Oxygen Saturation (Exercise) 92 % 91 % 92 % 90 %    Oxygen Saturation (Exit) 96 % 91 % 95 % 91 %    Rating of Perceived Exertion (Exercise) 13 11 12 11     Perceived Dyspnea (Exercise) 13 11 10 12     Duration Progress to 30 minutes of continuous aerobic without signs/symptoms of physical distress Progress to 30 minutes of continuous aerobic without signs/symptoms of physical distress Progress to 30 minutes of continuous aerobic without signs/symptoms of physical distress Progress to 30 minutes of continuous aerobic without signs/symptoms of physical distress    Intensity Rest + 20 Rest +  20 Rest + 20 Rest + 20      Progression   Progression Continue progressive overload as per policy without signs/symptoms or physical distress. Continue progressive overload as per policy without signs/symptoms or physical distress. Continue progressive overload as per policy without signs/symptoms or physical distress. Continue progressive overload as per policy without signs/symptoms or physical distress.      Resistance Training   Training Prescription Yes Yes Yes Yes    Weight 1 2 3 3     Reps 10-12 10-12 10-12 10-12      Treadmill   MPH 1.2 1.6 1.7 2.3    Grade 0 0 0 0    Minutes 15 15 15 15     METs 1.9 2.2 2.3 2.5      NuStep   Level 2 3 3 3     Watts 11 16 15 17     Minutes 20 20 20 20     METs 1.8 3.52 3.48 3.51      Home Exercise Plan   Plans to continue exercise at [place]    Frequency Add 2 additional days to program exercise sessions. Add 2 additional days to program exercise sessions. Add 2 additional days to program exercise sessions. Add 2 additional days to program exercise sessions.       Exercise Comments:      Exercise Comments    Row Name 04/13/16 1256 05/06/16 1512 06/07/16 0847  07/06/16 1254     Exercise Comments Patient has just started pulmonary rehab and will be progressed as needed.  Patient is progressing appropriately  Patient is proggressing appropriately Patient is progressing well       Discharge Exercise Prescription (Final Exercise Prescription Changes):     Exercise Prescription Changes - 07/06/16 1200      Exercise Review   Progression Yes     Response to Exercise   Blood Pressure (Admit) 100/56   Blood Pressure (Exercise) 116/60   Blood Pressure (Exit) 152/84   Heart Rate (Admit) 64 bpm   Heart Rate (Exercise) 92 bpm   Heart Rate (Exit) 83 bpm   Oxygen Saturation (Admit) 93 %   Oxygen Saturation (Exercise) 90 %   Oxygen Saturation (Exit) 91 %   Rating of Perceived Exertion (Exercise) 11   Perceived Dyspnea (Exercise) 12   Duration Progress to 30 minutes of continuous aerobic without signs/symptoms of physical distress   Intensity Rest + 20     Progression   Progression Continue progressive overload as per policy without signs/symptoms or physical distress.     Resistance Training   Training Prescription Yes   Weight 3   Reps 10-12     Treadmill   MPH 2.3   Grade 0   Minutes 15   METs 2.5     NuStep   Level 3   Watts 17   Minutes 20   METs 3.51     Home Exercise Plan   Plans to continue exercise at Home   Frequency Add 2 additional days to program exercise sessions.      Nutrition:  Target Goals: Understanding of nutrition guidelines, daily intake of sodium 1500mg , cholesterol 200mg , calories 30% from fat and 7% or less from saturated fats, daily to have 5 or more servings of fruits and vegetables.  Biometrics:     Pre Biometrics - 04/01/16 1152      Pre Biometrics   Height 5\' 8"  (1.727 m)   Weight 170 lb 3.1 oz (77.2 kg)  Waist Circumference 41.5 inches   Hip Circumference 38.5 inches   Waist to Hip Ratio 1.08 %   BMI (Calculated) 25.9   Triceps Skinfold 7 mm   % Body Fat 25 %   Grip Strength 90 kg    Flexibility 0 in   Single Leg Stand 5 seconds       Nutrition Therapy Plan and Nutrition Goals:   Nutrition Discharge: Rate Your Plate Scores:     Nutrition Assessments - 04/01/16 1127      Rate Your Plate Scores   Pre Score 40      Psychosocial: Target Goals: Acknowledge presence or absence of depression, maximize coping skills, provide positive support system. Participant is able to verbalize types and ability to use techniques and skills needed for reducing stress and depression.  Initial Review & Psychosocial Screening:     Initial Psych Review & Screening - 04/01/16 1133      Initial Review   Current issues with --  Not being able to do what he use to do.     Family Dynamics   Good Support System? Yes     Barriers   Psychosocial barriers to participate in program There are no identifiable barriers or psychosocial needs.     Screening Interventions   Interventions Encouraged to exercise      Quality of Life Scores:     Quality of Life - 04/01/16 1153      Quality of Life Scores   Health/Function Pre 17.09 %   Socioeconomic Pre 22.5 %   Psych/Spiritual Pre 22.5 %   Family Pre 24 %   GLOBAL Pre 20.18 %      PHQ-9: Recent Review Flowsheet Data    Depression screen [facility] 2/9 04/01/2016   Decreased Interest 0   Down, Depressed, Hopeless 0   PHQ - 2 Score 0   Altered sleeping 1   Tired, decreased energy 1   Change in appetite 0   Feeling bad or failure about yourself  0   Trouble concentrating 0   Moving slowly or fidgety/restless 0   Suicidal thoughts 0   PHQ-9 Score 2   Difficult doing work/chores Not difficult at all      Psychosocial Evaluation and Intervention:     Psychosocial Evaluation - 04/01/16 1134      Psychosocial Evaluation & Interventions   Interventions --  No referral needed.    Continued Psychosocial Services Needed No      Psychosocial Re-Evaluation:     Psychosocial Re-Evaluation    [place] Name 05/12/16 1450  06/09/16 1348 07/08/16 0756         Psychosocial Re-Evaluation   Interventions Encouraged to attend Pulmonary Rehabilitation for the exercise Encouraged to attend Pulmonary Rehabilitation for the exercise Encouraged to attend Pulmonary Rehabilitation for the exercise     Comments Patient's QOL score was 20.18 and his PHQ-9 score was 2. He continues to have no psychosocial issues identified.  Patient's QOL score was WNL at 20.18. He continue to have no psychosocial issues identified.  Patient psychosocial assessment reveals no barriers to participation in Pulmonary Rehab. Psychosocial areas that are currently affecting patient's rehab experience include concerns about health issues. Patient doescontinue to exhibit positivecoping skills to deal with his psychosocial concerns. Offered emotional support and reassurance. Patient doesfeel he is making some  progress toward Pulmonary Rehab goals. Patient reports his health and activity level hasimproved in the past 30 days as evidenced by patient's report of increasedability to walk more  and do some outside activities. Patient states family/friends havenoticed changes in his activity or mood. Patient reports feeling positive about current and projected progression in Pulmonary Rehab. After reviewing the patient's treatment plan, the patient ismaking progress toward Pulmonary Rehab goals. Patient's rate of progress toward rehab goals is good. Plan of action to help patient continue to work towards rehab goals include continuing to attend Pulmonary Rehab sessions. Will continue to monitor and evaluate progress toward psychosocial goal(s).     Continued Psychosocial Services Needed No No No        Education: Education Goals: Education classes will be provided on a weekly basis, covering required topics. Participant will state understanding/return demonstration of topics presented.  Learning Barriers/Preferences:     Learning Barriers/Preferences -  04/01/16 1048      Learning Barriers/Preferences   Learning Barriers Hearing   Learning Preferences Skilled Demonstration;Individual Instruction;Group Instruction      Education Topics: How Lungs Work and Diseases: - Discuss the anatomy of the lungs and diseases that can affect the lungs, such as COPD. Flowsheet Row PULMONARY REHAB CHRONIC OBSTRUCTIVE PULMONARY DISEASE from 07/01/2016 in [place]  Date  07/01/16  Educator  [place]  Instruction Review Code  2- meets goals/outcomes      Exercise: -Discuss the importance of exercise, FITT principles of exercise, normal and abnormal responses to exercise, and how to exercise safely. Flowsheet Row PULMONARY REHAB CHRONIC OBSTRUCTIVE PULMONARY DISEASE from 07/01/2016 in [place]  Date  04/08/16  Educator  [place]  Instruction Review Code  2- meets goals/outcomes      Environmental Irritants: -Discuss types of environmental irritants and how to limit exposure to environmental irritants. Flowsheet Row PULMONARY REHAB CHRONIC OBSTRUCTIVE PULMONARY DISEASE from 07/01/2016 in [place]  Date  04/15/16  Educator  Nils Flack  Instruction Review Code  2- meets goals/outcomes      Meds/Inhalers and oxygen: - Discuss respiratory medications, definition of an inhaler and oxygen, and the proper way to use an inhaler and oxygen.   Energy Saving Techniques: - Discuss methods to conserve energy and decrease shortness of breath when performing activities of daily living.  Flowsheet Row PULMONARY REHAB CHRONIC OBSTRUCTIVE PULMONARY DISEASE from 07/01/2016 in [place]  Date  04/29/16  Educator  Suzanne Boron  Instruction Review Code  2- meets goals/outcomes      Bronchial Hygiene / Breathing Techniques: - Discuss breathing mechanics, pursed-lip breathing technique,  proper posture, effective ways to clear airways, and other functional breathing  techniques Flowsheet Row PULMONARY REHAB CHRONIC OBSTRUCTIVE PULMONARY DISEASE from 07/01/2016 in [place]  Date  05/06/16  Educator  Nils Flack  Instruction Review Code  2- meets goals/outcomes      Cleaning Equipment: - Provides group verbal and written instruction about the health risks of elevated stress, cause of high stress, and healthy ways to reduce stress.   Nutrition I: Fats: - Discuss the types of cholesterol, what cholesterol does to the body, and how cholesterol levels can be controlled. Flowsheet Row PULMONARY REHAB CHRONIC OBSTRUCTIVE PULMONARY DISEASE from 07/01/2016 in [place]  Date  05/13/16  Educator  Lisabeth Register  Instruction Review Code  2- meets goals/outcomes      Nutrition II: Labels: -Discuss the different components of food labels and how to read food labels.   Respiratory Infections: - Discuss the signs and symptoms of respiratory infections, ways to prevent respiratory infections, and the importance of seeking medical  treatment when having a respiratory infection.   Stress I: Signs and Symptoms: - Discuss the causes of stress, how stress may lead to anxiety and depression, and ways to limit stress.   Stress II: Relaxation: -Discuss relaxation techniques to limit stress.   Oxygen for Home/Travel: - Discuss how to prepare for travel when on oxygen and proper ways to transport and store oxygen to ensure safety. Flowsheet Row PULMONARY REHAB CHRONIC OBSTRUCTIVE PULMONARY DISEASE from 07/01/2016 in [place]  Date  06/24/16  Educator  DJ  Instruction Review Code  2- meets goals/outcomes      Knowledge Questionnaire Score:     Knowledge Questionnaire Score - 04/01/16 1111      Knowledge Questionnaire Score   Pre Score 9/14      Core Components/Risk Factors/Patient Goals at Admission:     Personal Goals and Risk Factors at Admission - 04/01/16 1129      Core  Components/Risk Factors/Patient Goals on Admission    Weight Management Obesity   Increase Strength and Stamina Yes   Intervention Provide advice, education, support and counseling about physical activity/exercise needs.;Develop an individualized exercise prescription for aerobic and resistive training based on initial evaluation findings, risk stratification, comorbidities and participant's personal goals.   Expected Outcomes Achievement of increased cardiorespiratory fitness and enhanced flexibility, muscular endurance and strength shown through measurements of functional capacity and personal statement of participant.   Improve shortness of breath with ADL's Yes   Intervention Provide education, individualized exercise plan and daily activity instruction to help decrease symptoms of SOB with activities of daily living.   Expected Outcomes Short Term: Achieves a reduction of symptoms when performing activities of daily living.   Develop more efficient breathing techniques such as purse lipped breathing and diaphragmatic breathing; and practicing self-pacing with activity Yes   Intervention Provide education, demonstration and support about specific breathing techniuqes utilized for more efficient breathing. Include techniques such as pursed lipped breathing, diaphragmatic breathing and self-pacing activity.   Expected Outcomes Short Term: Participant will be able to demonstrate and use breathing techniques as needed throughout daily activities.   Personal Goal Other Yes   Personal Goal To be able to do what he use to do without SOB. Walk outdoors more, and be more active in general.    Intervention Attend PR 2 x week and supplement his exercise 3 x week at home.    Expected Outcomes Reach personal goals.       Core Components/Risk Factors/Patient Goals Review:      Goals and Risk Factor Review    Row Name 04/01/16 1132 05/12/16 1448 06/09/16 1345 07/08/16 0750       Core Components/Risk  Factors/Patient Goals Review   Personal Goals Review Weight Management/Obesity;Increase Strength and Stamina;Improve shortness of breath with ADL's;Develop more efficient breathing techniques such as purse lipped breathing and diaphragmatic breathing and practicing self-pacing with activity. Weight Management/Obesity;Increase Strength and Stamina;Improve shortness of breath with ADL's;Develop more efficient breathing techniques such as purse lipped breathing and diaphragmatic breathing and practicing self-pacing with activity.  To be able to do what he use to do without SOB. Walk outdoors more, and be more active in general.  Weight Management/Obesity;Increase Strength and Stamina;Improve shortness of breath with ADL's;Develop more efficient breathing techniques such as purse lipped breathing and diaphragmatic breathing and practicing self-pacing with activity.  To be able to do what he use to do without SOB. Walk outdoors more, and be more active in general.  Weight Management/Obesity;Increase Strength  and Stamina;Develop more efficient breathing techniques such as purse lipped breathing and diaphragmatic breathing and practicing self-pacing with activity.;Improve shortness of breath with ADL's  Be able to do what he used to do without SOB; walk more outside; be more active.    Review  - Patient has attended 9 sessions with some progress and some increased strength and stamina. He has maintained his weight. Will continue to monitor for progress.  Patient has attended 13 sessions. His last session attended was 05/25/16. He has not been able to come d/t COPD excerbation. He has progressed well in the program with some improvement in his strength and stamina and SOB. He has lost 10 lbs since starting the program.  Patient has attended 19 sessions. He has lost 2 lbs since starting the program. He is progressing well with increased strength and stamina and some improvement in SOB. He says the program is helping  him.     Expected Outcomes  - Patient will continue to attend sessions meeting his personal goals.  Patient will continue to attend sessions and complete the program meeting his personal goals.  Patient will complete the program meeting his personal goals.        Core Components/Risk Factors/Patient Goals at Discharge (Final Review):      Goals and Risk Factor Review - 07/08/16 0750      Core Components/Risk Factors/Patient Goals Review   Personal Goals Review Weight Management/Obesity;Increase Strength and Stamina;Develop more efficient breathing techniques such as purse lipped breathing and diaphragmatic breathing and practicing self-pacing with activity.;Improve shortness of breath with ADL's  Be able to do what he used to do without SOB; walk more outside; be more active.   Review Patient has attended 19 sessions. He has lost 2 lbs since starting the program. He is progressing well with increased strength and stamina and some improvement in SOB. He says the program is helping him.    Expected Outcomes Patient will complete the program meeting his personal goals.       ITP Comments:     ITP Comments    Row Name 04/01/16 1120 04/14/16 1418         ITP Comments Mr. Lenzi is an 83 yr old gentleman that was here for his initial visit. He attended the basic heart healthy group nutrition class today 04/01/16 with dietician. He went over principles of rating your plate and how to plan and eat a heart healthy meal. The importance of keeping your weight down, how to choose healthy meals while dining out.  Patient new to program. Has attended 3 sessions. Will continue to monitor for progress.          Comments: ITP 30 Day REVIEW Pt is making expected progress toward pulmonary rehab goals after completing 19 sessions. Recommend continued exercise, life style modification, education, and utilization of breathing techniques to increase stamina and strength and decrease shortness of breath with  exertion.

## 2016-07-08 NOTE — Progress Notes (Signed)
Jon White presents to pulmonary rehab for his bi-weekly exercise session. I have completed his thirty day face to face review and determined that Jon White is on track for meeting their pulmonary rehab goals. There are not barriers identified that will prevent them from continuing their exercise in pulmonary rehab as prescribed.   He does have more shortness of breath today and has more phlegm. He may have more trouble for the next few days.

## 2016-07-08 NOTE — Progress Notes (Signed)
Daily Session Note  Patient Details  Name: Jon White MRN: [id number] Date of Birth: 08-06-1932 Referring Provider:   Flowsheet Row PULMONARY REHAB COPD ORIENTATION from 04/01/2016 in [place]  Referring Provider  Dr. Chase Caller      Encounter Date: 07/08/2016  Check In:     Session Check In - 07/08/16 0858      Check-In   Location [facility]   Staff Present Suzanne Boron, BS, EP, Exercise Physiologist;Debra Wynetta Emery, RN, BSN   Supervising physician immediately available to respond to emergencies See telemetry face sheet for immediately available MD   Medication changes reported     No   Fall or balance concerns reported    No   Warm-up and Cool-down Performed as group-led instruction   Resistance Training Performed Yes   VAD Patient? No     Pain Assessment   Currently in Pain? No/denies   Pain Score 0-No pain   Multiple Pain Sites No      Capillary Blood Glucose: No results found for this or any previous visit (from the past 24 hour(s)).   Goals Met:  Independence with exercise equipment Improved SOB with ADL's Using PLB without cueing & demonstrates good technique Exercise tolerated well No report of cardiac concerns or symptoms Strength training completed today  Goals Unmet:  Not Applicable  Comments: Check out 1000   Dr. Sinda Du is Medical Director for [facility] Re[facility].

## 2016-07-13 ENCOUNTER — Other Ambulatory Visit: Payer: Self-pay | Admitting: Internal Medicine

## 2016-07-13 ENCOUNTER — Encounter (HOSPITAL_COMMUNITY)
Admission: RE | Admit: 2016-07-13 | Discharge: 2016-07-13 | Disposition: A | Discharge status: Home / Self Care | Payer: Medicare Other | Source: Ambulatory Visit | Attending: Internal Medicine | Admitting: Internal Medicine

## 2016-07-13 DIAGNOSIS — J449 Chronic obstructive pulmonary disease, unspecified: Secondary | ICD-10-CM | POA: Diagnosis not present

## 2016-07-13 NOTE — Progress Notes (Signed)
Daily Session Note  Patient Details  Name: Jon White MRN: [id number] Date of Birth: [birth year] Referring Provider:   Flowsheet Row PULMONARY REHAB COPD ORIENTATION from 04/01/2016 in [place]  Referring Provider  Dr. Chase Caller      Encounter Date: 07/13/2016  Check In:     Session Check In - 07/13/16 1330      Check-In   Location [facility]   Staff Present Diane Angelina Pih, MS, EP, [facility], Exercise Physiologist;Ellene Bloodsaw Luther Parody, BS, EP, Exercise Physiologist   Supervising physician immediately available to respond to emergencies See telemetry face sheet for immediately available MD   Medication changes reported     No   Fall or balance concerns reported    No   Warm-up and Cool-down Performed as group-led instruction   Resistance Training Performed Yes   VAD Patient? No     Pain Assessment   Currently in Pain? No/denies   Pain Score 0-No pain   Multiple Pain Sites No      Capillary Blood Glucose: No results found for this or any previous visit (from the past 24 hour(s)).   Goals Met:  Independence with exercise equipment Improved SOB with ADL's Using PLB without cueing & demonstrates good technique Exercise tolerated well No report of cardiac concerns or symptoms Strength training completed today  Goals Unmet:  Not Applicable  Comments: Check out 230   Dr. Sinda Du is Medical Director for [facility] Pulmonary Rehab.

## 2016-07-15 ENCOUNTER — Encounter (HOSPITAL_COMMUNITY)
Admission: RE | Admit: 2016-07-15 | Discharge: 2016-07-15 | Disposition: A | Discharge status: Home / Self Care | Payer: Medicare Other | Source: Ambulatory Visit | Attending: Internal Medicine | Admitting: Internal Medicine

## 2016-07-15 DIAGNOSIS — J449 Chronic obstructive pulmonary disease, unspecified: Secondary | ICD-10-CM

## 2016-07-15 NOTE — Progress Notes (Signed)
Daily Session Note  Patient Details  Name: REDFORD BEHRLE MRN: [id number] Date of Birth: [birth year] Referring Provider:   Flowsheet Row PULMONARY REHAB COPD ORIENTATION from 04/01/2016 in [place]  Referring Provider  Dr. Chase Caller      Encounter Date: 07/15/2016  Check In:     Session Check In - 07/15/16 1327      Check-In   [facility]   Staff Present Aundra Dubin, RN, BSN;Mylissa Lambe Luther Parody, BS, EP, Exercise Physiologist   Supervising physician immediately available to respond to emergencies See telemetry face sheet for immediately available MD   Medication changes reported     No   Fall or balance concerns reported    No   Warm-up and Cool-down Performed as group-led instruction   Resistance Training Performed Yes   VAD Patient? No     Pain Assessment   Currently in Pain? No/denies   Pain Score 0-No pain   Multiple Pain Sites No      Capillary Blood Glucose: No results found for this or any previous visit (from the past 24 hour(s)).   Goals Met:  Independence with exercise equipment Improved SOB with ADL's Using PLB without cueing & demonstrates good technique Exercise tolerated well No report of cardiac concerns or symptoms Strength training completed today  Goals Unmet:  Not Applicable  Comments: Check out 230   Dr. Sinda Du is Medical Director for [facility] Pulmonary Rehab.

## 2016-07-20 ENCOUNTER — Encounter (HOSPITAL_COMMUNITY)
Admission: RE | Admit: 2016-07-20 | Discharge: 2016-07-20 | Disposition: A | Discharge status: Home / Self Care | Payer: Medicare Other | Source: Ambulatory Visit | Attending: Internal Medicine | Admitting: Internal Medicine

## 2016-07-20 DIAGNOSIS — J449 Chronic obstructive pulmonary disease, unspecified: Secondary | ICD-10-CM | POA: Diagnosis not present

## 2016-07-20 NOTE — Progress Notes (Signed)
Daily Session Note  Patient Details  Name: TALLIE HEVIA MRN: [id number] Date of Birth: 1932/10/21 Referring Provider:   Flowsheet Row PULMONARY REHAB COPD ORIENTATION from 04/01/2016 in [place]  Referring Provider  Dr. Chase Caller      Encounter Date: 07/20/2016  Check In:     Session Check In - 07/20/16 1330      Check-In   Location [facility]   Staff Present Diane Angelina Pih, MS, EP, [facility], Exercise Physiologist;Starlee Corralejo Luther Parody, BS, EP, Exercise Physiologist   Supervising physician immediately available to respond to emergencies See telemetry face sheet for immediately available MD   Medication changes reported     No   Fall or balance concerns reported    No   Warm-up and Cool-down Performed as group-led instruction   Resistance Training Performed Yes   VAD Patient? No     Pain Assessment   Currently in Pain? No/denies   Pain Score 0-No pain   Multiple Pain Sites No      Capillary Blood Glucose: No results found for this or any previous visit (from the past 24 hour(s)).   Goals Met:  Independence with exercise equipment Improved SOB with ADL's Using PLB without cueing & demonstrates good technique Exercise tolerated well No report of cardiac concerns or symptoms Strength training completed today  Goals Unmet:  Not Applicable  Comments: Check out 230   Dr. Sinda Du is Medical Director for [facility] Pulmonary Re[facility].

## 2016-07-22 ENCOUNTER — Telehealth: Payer: Self-pay | Admitting: Internal Medicine

## 2016-07-22 MED ORDER — PREDNISONE 5 MG PO TABS: ORAL_TABLET | ORAL | 0 refills | Status: DC

## 2016-07-22 MED ORDER — DOXYCYCLINE HYCLATE 100 MG PO TABS: 100.0000 mg | ORAL_TABLET | Freq: Two times a day (BID) | ORAL | 0 refills | Status: DC

## 2016-07-22 NOTE — Telephone Encounter (Signed)
Spoke with pt. States that he feels he is having a flare up. Reports SOB, cough, chest congestion and chest tightness. Cough is non productive. Denies wheezing or fever. Symptoms started 1 week ago. Tried OTC meds to help with the congestion but he can't remember what the name of it is. Would like MR's recommendations.  MR - please advise. Thanks.

## 2016-07-22 NOTE — Telephone Encounter (Signed)
Called and spoke with pt and he is aware of MR recs and of meds that has been sent to the pharmacy.

## 2016-07-22 NOTE — Telephone Encounter (Signed)
Take doxycycline 100mg  po twice daily x 5 days; take after meals and avoid sunlight   Please take prednisone 40 mg x1 day, then 30 mg x1 day, then 20 mg x1 day, then 10 mg x1 day, and then 5 mg x1 day and stop  For aecopd  Dr. Brand Males, M.D., [facility].C.P Pulmonary and Critical Care Medicine Staff Physician [place] [facility] Pager: [phone], If no answer or between  15:00h - 7:00h: call [phone]  07/22/2016 11:58 AM      Allergies  Allergen Reactions  . Mometasone Furoate Other (See Comments)    Nasonex: CAUSED RED,ITCHY EYES; "lost my eyelashes"   . Naproxen Sodium Nausea And Vomiting  . Nsaids Other (See Comments)    Stomach ulcers  . Anoro Ellipta [Umeclidinium-Vilanterol] Other (See Comments)    Double vision  . Aspirin Hives and Nausea And Vomiting     Chills, sweats  . Breo Ellipta [Fluticasone Furoate-Vilanterol] Hives  . Dexilant [Dexlansoprazole] Diarrhea    Cramps   . Omnaris [Ciclesonide] Other (See Comments)    Nose bleed   . Spiriva Handihaler [Tiotropium Bromide Monohydrate] Other (See Comments)    Hiccups

## 2016-07-27 ENCOUNTER — Encounter (HOSPITAL_COMMUNITY)
Admission: RE | Admit: 2016-07-27 | Discharge: 2016-07-27 | Disposition: A | Discharge status: Home / Self Care | Payer: Medicare Other | Source: Ambulatory Visit | Attending: Internal Medicine | Admitting: Internal Medicine

## 2016-07-27 VITALS — Ht 68.0 in | Wt 170.7 lb

## 2016-07-27 DIAGNOSIS — J449 Chronic obstructive pulmonary disease, unspecified: Secondary | ICD-10-CM

## 2016-07-27 NOTE — Progress Notes (Signed)
Daily Session Note  Patient Details  Name: CHAI ROUTH MRN: [id number] Date of Birth: 1933-05-11 Referring Provider:   Flowsheet Row PULMONARY REHAB COPD ORIENTATION from 04/01/2016 in [place]  Referring Provider  Dr. Chase Caller      Encounter Date: 07/27/2016  Check In:     Session Check In - 07/27/16 1342      Check-In   Location [facility]   Staff Present Aundra Dubin, RN, BSN;Mckade Gurka Luther Parody, BS, EP, Exercise Physiologist   Supervising physician immediately available to respond to emergencies See telemetry face sheet for immediately available MD   Medication changes reported     No   Fall or balance concerns reported    No   Warm-up and Cool-down Performed as group-led instruction   Resistance Training Performed Yes   VAD Patient? No     Pain Assessment   Currently in Pain? No/denies   Pain Score 0-No pain   Multiple Pain Sites No      Capillary Blood Glucose: No results found for this or any previous visit (from the past 24 hour(s)).    History  Smoking Status  . Former Smoker  . Packs/day: 1.00  . Years: 60.00  . Types: Cigarettes  . Quit date: 08/06/2011  Smokeless Tobacco  . Never Used    Goals Met:  Independence with exercise equipment Improved SOB with ADL's Using PLB without cueing & demonstrates good technique Exercise tolerated well No report of cardiac concerns or symptoms Strength training completed today  Goals Unmet:  Not Applicable  Comments: Check out 230   Dr. Sinda Du is Medical Director for [facility] Pulmonary Rehab.

## 2016-07-28 NOTE — Progress Notes (Signed)
Discharge Summary  Patient Details  Name: Jon White MRN: [id number] Date of Birth: February 23, 1933 Referring Provider:   Flowsheet Row PULMONARY REHAB COPD ORIENTATION from 04/01/2016 in [place]  Referring Provider  Dr. Chase Caller       Number of Visits: 24  Reason for Discharge:  Patient reached a stable level of exercise. Patient independent in their exercise.  Smoking History:  History  Smoking Status  . Former Smoker  . Packs/day: 1.00  . Years: 60.00  . Types: Cigarettes  . Quit date: 08/06/2011  Smokeless Tobacco  . Never Used    Diagnosis:  Stage 2 moderate COPD by GOLD classification ([place])  ADL UCSD:     Pulmonary Assessment Scores    Row Name 04/01/16 1118 07/28/16 1353       ADL UCSD   ADL Phase Entry Exit    SOB Score total 40 76    Rest 1 2    Walk 6 10    Stairs 1 4    Bath 2 3    Dress 2 4    Shop 3 4      CAT Score   CAT Score 22 24      mMRC Score   mMRC Score 3 3       Initial Exercise Prescription:     Initial Exercise Prescription - 04/01/16 1100      Date of Initial Exercise RX and Referring Provider   Date 04/01/16   Referring Provider Dr. Chase Caller     Treadmill   MPH 1.2   Grade 0   Minutes 15   METs 1.9     NuStep   Level 2   SPM 12   Minutes 20   METs 1.7     Prescription Details   Frequency (times per week) 2   Duration Progress to 30 minutes of continuous aerobic without signs/symptoms of physical distress     Intensity   THRR REST +  20   THRR 40-80% of Max Heartrate [phone]   Ratings of Perceived Exertion 11-13   Perceived Dyspnea 0-4     Progression   Progression Continue progressive overload as per policy without signs/symptoms or physical distress.     Resistance Training   Training Prescription Yes   Weight 1   Reps 10-12      Discharge Exercise Prescription (Final Exercise Prescription Changes):     Exercise Prescription Changes - 07/06/16 1200      Response to Exercise   Blood Pressure (Admit) 100/56   Blood Pressure (Exercise) 116/60   Blood Pressure (Exit) 152/84   Heart Rate (Admit) 64 bpm   Heart Rate (Exercise) 92 bpm   Heart Rate (Exit) 83 bpm   Oxygen Saturation (Admit) 93 %   Oxygen Saturation (Exercise) 90 %   Oxygen Saturation (Exit) 91 %   Rating of Perceived Exertion (Exercise) 11   Perceived Dyspnea (Exercise) 12   Duration Progress to 30 minutes of continuous aerobic without signs/symptoms of physical distress   Intensity Rest + 20     Progression   Progression Continue progressive overload as per policy without signs/symptoms or physical distress.     Resistance Training   Training Prescription Yes   Weight 3   Reps 10-12     Treadmill   MPH 2.3   Grade 0   Minutes 15   METs 2.5     NuStep   Level 3   SPM 17   Minutes  20   METs 3.51     Home Exercise Plan   Plans to continue exercise at Home   Frequency Add 2 additional days to program exercise sessions.     Exercise Review   Progression Yes      Functional Capacity:     6 Minute Walk    Row Name 04/01/16 1150 07/27/16 1537       6 Minute Walk   Phase Initial Discharge    Distance 1200 feet 1300 feet    Distance % Change 0 % 8.33 %    Walk Time 6 minutes 6 minutes    # of Rest Breaks 0 0    MPH 2.27 2.46    METS 2.74 2.88    RPE 13 7    Perceived Dyspnea  14 10    VO2 Peak 7.02 7.6    Symptoms No No    Resting HR 68 bpm 64 bpm    Resting BP 94/62 96/60    Max Ex. HR 83 bpm 90 bpm    Max Ex. BP 120/64 110/64    2 Minute Post BP 104/62 100/62       Psychological, QOL, Others - Outcomes: PHQ 2/9: Depression screen [facility] 2/9 07/28/2016 04/01/2016  Decreased Interest 1 0  Down, Depressed, Hopeless 0 0  PHQ - 2 Score 1 0  Altered sleeping 1 1  Tired, decreased energy 2 1  Change in appetite 0 0  Feeling bad or failure about yourself  0 0  Trouble concentrating 0 0  Moving slowly or fidgety/restless 0 0  Suicidal thoughts 0  0  PHQ-9 Score 4 2  Difficult doing work/chores Somewhat difficult Not difficult at all    Quality of Life:     Quality of Life - 07/27/16 1539      Quality of Life Scores   Health/Function Pre 17.09 %   Health/Function Post 17.18 %   Health/Function % Change 0.53 %   Socioeconomic Pre 22.5 %   Socioeconomic Post 26.75 %   Socioeconomic % Change  18.89 %   Psych/Spiritual Pre 22.5 %   Psych/Spiritual Post 28.07 %   Psych/Spiritual % Change 24.76 %   Family Pre 24 %   Family Post 25.5 %   Family % Change 6.25 %   GLOBAL Pre 20.18 %   GLOBAL Post 22.56 %   GLOBAL % Change 11.79 %      Personal Goals: Goals established at orientation with interventions provided to work toward goal.     Personal Goals and Risk Factors at Admission - 04/01/16 1129      Core Components/Risk Factors/Patient Goals on Admission    Weight Management Obesity   Increase Strength and Stamina Yes   Intervention Provide advice, education, support and counseling about physical activity/exercise needs.;Develop an individualized exercise prescription for aerobic and resistive training based on initial evaluation findings, risk stratification, comorbidities and participant's personal goals.   Expected Outcomes Achievement of increased cardiorespiratory fitness and enhanced flexibility, muscular endurance and strength shown through measurements of functional capacity and personal statement of participant.   Improve shortness of breath with ADL's Yes   Intervention Provide education, individualized exercise plan and daily activity instruction to help decrease symptoms of SOB with activities of daily living.   Expected Outcomes Short Term: Achieves a reduction of symptoms when performing activities of daily living.   Develop more efficient breathing techniques such as purse lipped breathing and diaphragmatic breathing; and practicing self-pacing with activity  Yes   Intervention Provide education, demonstration  and support about specific breathing techniuqes utilized for more efficient breathing. Include techniques such as pursed lipped breathing, diaphragmatic breathing and self-pacing activity.   Expected Outcomes Short Term: Participant will be able to demonstrate and use breathing techniques as needed throughout daily activities.   Personal Goal Other Yes   Personal Goal To be able to do what he use to do without SOB. Walk outdoors more, and be more active in general.    Intervention Attend PR 2 x week and supplement his exercise 3 x week at home.    Expected Outcomes Reach personal goals.        Personal Goals Discharge:     Goals and Risk Factor Review    Row Name 04/01/16 1132 05/12/16 1448 06/09/16 1345 07/08/16 0750 07/28/16 1358     Core Components/Risk Factors/Patient Goals Review   Personal Goals Review Weight Management/Obesity;Increase Strength and Stamina;Improve shortness of breath with ADL's;Develop more efficient breathing techniques such as purse lipped breathing and diaphragmatic breathing and practicing self-pacing with activity. Weight Management/Obesity;Increase Strength and Stamina;Improve shortness of breath with ADL's;Develop more efficient breathing techniques such as purse lipped breathing and diaphragmatic breathing and practicing self-pacing with activity.  To be able to do what he use to do without SOB. Walk outdoors more, and be more active in general.  Weight Management/Obesity;Increase Strength and Stamina;Improve shortness of breath with ADL's;Develop more efficient breathing techniques such as purse lipped breathing and diaphragmatic breathing and practicing self-pacing with activity.  To be able to do what he use to do without SOB. Walk outdoors more, and be more active in general.  Weight Management/Obesity;Increase Strength and Stamina;Develop more efficient breathing techniques such as purse lipped breathing and diaphragmatic breathing and practicing self-pacing  with activity.;Improve shortness of breath with ADL's  Be able to do what he used to do without SOB; walk more outside; be more active. Weight Management/Obesity;Improve shortness of breath with ADL's;Develop more efficient breathing techniques such as purse lipped breathing and diaphragmatic breathing and practicing self-pacing with activity.   Review  - Patient has attended 9 sessions with some progress and some increased strength and stamina. He has maintained his weight. Will continue to monitor for progress.  Patient has attended 13 sessions. His last session attended was 05/25/16. He has not been able to come d/t COPD excerbation. He has progressed well in the program with some improvement in his strength and stamina and SOB. He has lost 10 lbs since starting the program.  Patient has attended 19 sessions. He has lost 2 lbs since starting the program. He is progressing well with increased strength and stamina and some improvement in SOB. He says the program is helping him.  Upon graduation, patient lost 2 lbs. His waist and hips decreased in size. He says he did not meet his personal goals in the program. Patient has had 2 COPD exacerbations since starting the program which hindered his progress.  He did progress in the program with some increased strength and improvement in his SOB.    Expected Outcomes  - Patient will continue to attend sessions meeting his personal goals.  Patient will continue to attend sessions and complete the program meeting his personal goals.  Patient will complete the program meeting his personal goals.  Patient plans to join maintenance 2 days/week and will work toward meeting his personal goasl.       Nutrition & Weight - Outcomes:     Pre Biometrics -  04/01/16 1152      Pre Biometrics   Height 5\' 8"  (1.727 m)   Weight 170 lb 3.1 oz (77.2 kg)   Waist Circumference 41.5 inches   Hip Circumference 38.5 inches   Waist to Hip Ratio 1.08 %   BMI (Calculated) 25.9    Triceps Skinfold 7 mm   % Body Fat 25 %   Grip Strength 90 kg   Flexibility 0 in   Single Leg Stand 5 seconds         Post Biometrics - 07/27/16 1538       Post  Biometrics   Height 5\' 8"  (1.727 m)   Weight 170 lb 11.9 oz (77.4 kg)   Waist Circumference 38 inches   Hip Circumference 39 inches   Waist to Hip Ratio 0.97 %   BMI (Calculated) 26   Triceps Skinfold 9 mm   % Body Fat 24.4 %   Grip Strength 91.67 kg   Flexibility 0 in   Single Leg Stand 2 seconds      Nutrition:   Nutrition Discharge:     Nutrition Assessments - 07/28/16 1352      Rate Your Plate Scores   Pre Score 48   Pre Score % 48 %   Post Score 48   Post Score % 48 %   % Change 0 %      Education Questionnaire Score:     Knowledge Questionnaire Score - 07/28/16 1350      Knowledge Questionnaire Score   Pre Score 9/14   Post Score 12/14      Goals reviewed with patient; copy given to patient.

## 2016-07-28 NOTE — Progress Notes (Signed)
Cardiac Individual Treatment Plan  Patient Details  Name: Jon White MRN: [id number] Date of Birth: [birth year] Referring Provider:   Flowsheet Row PULMONARY REHAB COPD ORIENTATION from 04/01/2016 in [facility]  Referring Provider  Dr. Chase Caller      Initial Encounter Date:  Flowsheet Row PULMONARY REHAB COPD ORIENTATION from 04/01/2016 in [facility]  Date  04/01/16  Referring Provider  Dr. Chase Caller      Visit Diagnosis: Stage 2 moderate COPD by GOLD classification ([place])  Patient's Home Medications on Admission:  Current Outpatient Prescriptions:  .  Aclidinium Bromide (TUDORZA PRESSAIR) 400 MCG/ACT AEPB, Inhale 1 puff into the lungs 2 (two) times daily., Disp: 1 each, Rfl: 5 .  apixaban (ELIQUIS) 5 MG TABS tablet, Take 1 tablet (5 mg total) by mouth 2 (two) times daily., Disp: 180 tablet, Rfl: 3 .  atorvastatin (LIPITOR) 10 MG tablet, Take 10 mg by mouth daily at 6 PM. , Disp: , Rfl:  .  budesonide-formoterol (SYMBICORT) 160-4.5 MCG/ACT inhaler, Inhale 2 puffs into the lungs 2 (two) times daily., Disp: 2 Inhaler, Rfl: 0 .  doxycycline (VIBRA-TABS) 100 MG tablet, Take 1 tablet (100 mg total) by mouth 2 (two) times daily., Disp: 10 tablet, Rfl: 0 .  esomeprazole (NEXIUM) 40 MG capsule, Take 1 capsule (40 mg total) by mouth daily., Disp: 90 capsule, Rfl: 6 .  fluocinonide cream (LIDEX) 4.85 %, Apply 1 application topically daily as needed (rash). To infected area., Disp: , Rfl:  .  fluticasone (FLONASE) 50 MCG/ACT nasal spray, Place 2 sprays into the nose at bedtime. Each nostril once a day., Disp: , Rfl:  .  furosemide (LASIX) 40 MG tablet, TAKE 1 TABLET(40 MG) BY MOUTH DAILY, Disp: 90 tablet, Rfl: 2 .  metoprolol tartrate (LOPRESSOR) 25 MG tablet, TAKE 1/2 TABLET BY MOUTH TWICE DAILY, Disp: 30 tablet, Rfl: 5 .  nitroGLYCERIN (NITROSTAT) 0.4 MG SL tablet, Place 1 tablet (0.4 mg total) under the tongue every 5 (five) minutes as needed  for chest pain., Disp: 75 tablet, Rfl: 3 .  phenol (CHLORASEPTIC) 1.4 % LIQD, Use as directed 1 spray in the mouth or throat as needed for throat irritation / pain., Disp: , Rfl:  .  predniSONE (DELTASONE) 5 MG tablet, Take 40 mg  X 1 day, 30 mg x 1 day, 20 mg x 1 day, 10 mg x 1 day, 5 mg x 1 day then stop., Disp: 21 tablet, Rfl: 0 .  PROAIR HFA 108 (90 Base) MCG/ACT inhaler, INHALE 2 PUFFS BY MOUTH EVERY 6 HOURS AS NEEDED FOR WHEEZING OR SHORTNESS OF BREATH, Disp: 8.5 g, Rfl: 5 .  tamsulosin (FLOMAX) 0.4 MG CAPS capsule, Take 0.4 mg by mouth daily., Disp: , Rfl:   Past Medical History: Past Medical History:  Diagnosis Date  . Allergic rhinitis   . Allergy   . Asthma   . Atrial fibrillation ([place])   . Bleeding gastric ulcer   . Blood transfusion without reported diagnosis   . Cataract    bilateral removed  . Chronic renal insufficiency, stage III (moderate)   . COPD (chronic obstructive pulmonary disease) ([place])   . Diabetes mellitus without complication ([place])    no meds- boarderline  . Duodenum ulcer    with perforation  . DVT of axillary vein, acute left ([place]) 10/24/2014  . Emphysema   . GERD (gastroesophageal reflux disease)   . H/O hiatal hernia   . High cholesterol   . Hypertension   .  Mixed hyperlipidemia   . Myocardial infarction   . Neuromuscular disorder ([place])    reports shaking, somedays "can't hold spoon" - generalized  shakiness, is going to talk to MD about it at next appt.   . Normal cardiac stress test    reports stress test was several yrs. ago /w [employer]. Dr. Rogelia Mire, WNL   . OSA (obstructive sleep apnea) 2007   CPAP- report of settings on paper chart, seen by [facility] - seen 07/2011   . PE (pulmonary embolism) 10/2011  . Prostate cancer ([place]) 1992   in remission.  s/p XRT, melanoma under left eye, left temple  . Recurrent upper respiratory infection (URI)    coughing from chronic sinusitis   . Renal stone    some passed spontaneous, & also had cystoscopy   . Shortness of breath on exertion   . Sleep apnea    wears c-pap    Tobacco Use: History  Smoking Status  . Former Smoker  . Packs/day: 1.00  . Years: 60.00  . Types: Cigarettes  . Quit date: 08/06/2011  Smokeless Tobacco  . Never Used    Labs: Recent Review Flowsheet Data    Labs for ITP Cardiac and Pulmonary Rehab Latest Ref Rng & Units 08/28/2013 08/31/2013   Trlycerides <150 mg/dL 56 -   Hemoglobin A1c <5.7 % - 6.1(H)   PHART 7.350 - 7.450 7.347(L) -   PCO2ART 35.0 - 45.0 mmHg 46.0(H) -   HCO3 20.0 - 24.0 mEq/L 25.3(H) -   TCO2 0 - 100 mmol/L 27 -   ACIDBASEDEF 0.0 - 2.0 mmol/L 1.0 -   O2SAT % 99.0 -      Capillary Blood Glucose: Lab Results  Component Value Date   GLUCAP 137 (H) 08/30/2013   GLUCAP 111 (H) 08/30/2013   GLUCAP 131 (H) 08/30/2013   GLUCAP 138 (H) 08/30/2013   GLUCAP 136 (H) 08/29/2013     Exercise Target Goals:    Exercise Program Goal: Individual exercise prescription set with THRR, safety & activity barriers. Participant demonstrates ability to understand and report RPE using BORG scale, to self-measure pulse accurately, and to acknowledge the importance of the exercise prescription.  Exercise Prescription Goal: Starting with aerobic activity 30 plus minutes a day, 3 days per week for initial exercise prescription. Provide home exercise prescription and guidelines that participant acknowledges understanding prior to discharge.  Activity Barriers & Risk Stratification:     Activity Barriers & Cardiac Risk Stratification - 04/01/16 1046      Activity Barriers & Cardiac Risk Stratification   Activity Barriers Back Problems   Cardiac Risk Stratification High  Patient has had MI in past 02/08/2014      6 Minute Walk:     6 Minute Walk    Row Name 04/01/16 1150 07/27/16 1537       6 Minute Walk   Phase Initial Discharge    Distance 1200 feet 1300 feet    Distance % Change 0 % 8.33 %    Walk Time 6 minutes 6 minutes    # of Rest  Breaks 0 0    MPH 2.27 2.46    METS 2.74 2.88    RPE 13 7    Perceived Dyspnea  14 10    VO2 Peak 7.02 7.6    Symptoms No No    Resting HR 68 bpm 64 bpm    Resting BP 94/62 96/60    Max Ex. HR 83 bpm 90 bpm  Max Ex. BP 120/64 110/64    2 Minute Post BP 104/62 100/62       Oxygen Initial Assessment:   Oxygen Re-Evaluation:   Oxygen Discharge (Final Oxygen Re-Evaluation):   Initial Exercise Prescription:     Initial Exercise Prescription - 04/01/16 1100      Date of Initial Exercise RX and Referring Provider   Date 04/01/16   Referring Provider Dr. Chase Caller     Treadmill   MPH 1.2   Grade 0   Minutes 15   METs 1.9     NuStep   Level 2   SPM 12   Minutes 20   METs 1.7     Prescription Details   Frequency (times per week) 2   Duration Progress to 30 minutes of continuous aerobic without signs/symptoms of physical distress     Intensity   THRR REST +  20   THRR 40-80% of Max Heartrate [phone]   Ratings of Perceived Exertion 11-13   Perceived Dyspnea 0-4     Progression   Progression Continue progressive overload as per policy without signs/symptoms or physical distress.     Resistance Training   Training Prescription Yes   Weight 1   Reps 10-12      Perform Capillary Blood Glucose checks as needed.  Exercise Prescription Changes:      Exercise Prescription Changes    Row Name 04/13/16 1200 05/06/16 1500 06/07/16 0800 07/06/16 1200       Response to Exercise   Blood Pressure (Admit) 108/70 112/62 98/56 100/56    Blood Pressure (Exercise) 132/74 138/70 118/72 116/60    Blood Pressure (Exit) 98/60 102/56 110/64 152/84    Heart Rate (Admit) 66 bpm 60 bpm 83 bpm 64 bpm    Heart Rate (Exercise) 64 bpm 91 bpm 95 bpm 92 bpm    Heart Rate (Exit) 43 bpm 95 bpm 90 bpm 83 bpm    Oxygen Saturation (Admit) 94 % 94 % 94 % 93 %    Oxygen Saturation (Exercise) 92 % 91 % 92 % 90 %    Oxygen Saturation (Exit) 96 % 91 % 95 % 91 %    Rating of  Perceived Exertion (Exercise) '13 11 12 11    '$ Perceived Dyspnea (Exercise) '13 11 10 12    '$ Duration Progress to 30 minutes of continuous aerobic without signs/symptoms of physical distress Progress to 30 minutes of continuous aerobic without signs/symptoms of physical distress Progress to 30 minutes of continuous aerobic without signs/symptoms of physical distress Progress to 30 minutes of continuous aerobic without signs/symptoms of physical distress    Intensity Rest + 20 Rest + 20 Rest + 20 Rest + 20      Progression   Progression Continue progressive overload as per policy without signs/symptoms or physical distress. Continue progressive overload as per policy without signs/symptoms or physical distress. Continue progressive overload as per policy without signs/symptoms or physical distress. Continue progressive overload as per policy without signs/symptoms or physical distress.      Resistance Training   Training Prescription Yes Yes Yes Yes    Weight '1 2 3 3    '$ Reps 10-12 10-12 10-12 10-12      Treadmill   MPH 1.2 1.6 1.7 2.3    Grade 0 0 0 0    Minutes '15 15 15 15    '$ METs 1.9 2.2 2.3 2.5      NuStep   Level '2 3 3 3    '$ SPM  $'11 16 15 17    't$ Minutes '20 20 20 20    '$ METs 1.8 3.52 3.48 3.51      Home Exercise Plan   Plans to continue exercise at [facility]    Frequency Add 2 additional days to program exercise sessions. Add 2 additional days to program exercise sessions. Add 2 additional days to program exercise sessions. Add 2 additional days to program exercise sessions.      Exercise Review   Progression No Yes Yes Yes       Exercise Comments:      Exercise Comments    Row Name 04/13/16 1256 05/06/16 1512 06/07/16 0847 07/06/16 1254     Exercise Comments Patient has just started pulmonary rehab and will be progressed as needed.  Patient is progressing appropriately  Patient is proggressing appropriately Patient is progressing well       Exercise Goals and  Review:   Exercise Goals Re-Evaluation :     Exercise Goals Re-Evaluation    Row Name 07/28/16 1355             Exercise Goal Re-Evaluation   Exercise Goals Review Increase Strenth and Stamina;Increase Physical Activity       Comments Upon graduation, patient says he feels 20% better overall. He does feel stronger and he says he has increased his activity level 20%. He says the program did not meet his goals overall but has helped.        Expected Outcomes Patient plans to join maintenance to continue exercising hopefully continuing to meet his personal goals.            Discharge Exercise Prescription (Final Exercise Prescription Changes):     Exercise Prescription Changes - 07/06/16 1200      Response to Exercise   Blood Pressure (Admit) 100/56   Blood Pressure (Exercise) 116/60   Blood Pressure (Exit) 152/84   Heart Rate (Admit) 64 bpm   Heart Rate (Exercise) 92 bpm   Heart Rate (Exit) 83 bpm   Oxygen Saturation (Admit) 93 %   Oxygen Saturation (Exercise) 90 %   Oxygen Saturation (Exit) 91 %   Rating of Perceived Exertion (Exercise) 11   Perceived Dyspnea (Exercise) 12   Duration Progress to 30 minutes of continuous aerobic without signs/symptoms of physical distress   Intensity Rest + 20     Progression   Progression Continue progressive overload as per policy without signs/symptoms or physical distress.     Resistance Training   Training Prescription Yes   Weight 3   Reps 10-12     Treadmill   MPH 2.3   Grade 0   Minutes 15   METs 2.5     NuStep   Level 3   SPM 17   Minutes 20   METs 3.51     Home Exercise Plan   Plans to continue exercise at Home   Frequency Add 2 additional days to program exercise sessions.     Exercise Review   Progression Yes      Nutrition:  Target Goals: Understanding of nutrition guidelines, daily intake of sodium '1500mg'$ , cholesterol '200mg'$ , calories 30% from fat and 7% or less from saturated fats, daily to have 5  or more servings of fruits and vegetables.  Biometrics:     Pre Biometrics - 04/01/16 1152      Pre Biometrics   Height '5\' 8"'$  (1.727 m)   Weight 170 lb 3.1 oz (77.2 kg)   Waist  Circumference 41.5 inches   Hip Circumference 38.5 inches   Waist to Hip Ratio 1.08 %   BMI (Calculated) 25.9   Triceps Skinfold 7 mm   % Body Fat 25 %   Grip Strength 90 kg   Flexibility 0 in   Single Leg Stand 5 seconds         Post Biometrics - 07/27/16 1538       Post  Biometrics   Height '5\' 8"'$  (1.727 m)   Weight 170 lb 11.9 oz (77.4 kg)   Waist Circumference 38 inches   Hip Circumference 39 inches   Waist to Hip Ratio 0.97 %   BMI (Calculated) 26   Triceps Skinfold 9 mm   % Body Fat 24.4 %   Grip Strength 91.67 kg   Flexibility 0 in   Single Leg Stand 2 seconds      Nutrition Therapy Plan and Nutrition Goals:   Nutrition Discharge: Rate Your Plate Scores:     Nutrition Assessments - 07/28/16 1352      Rate Your Plate Scores   Pre Score 48   Pre Score % 48 %   Post Score 48   Post Score % 48 %   % Change 0 %      Nutrition Goals Re-Evaluation:   Nutrition Goals Discharge (Final Nutrition Goals Re-Evaluation):   Psychosocial: Target Goals: Acknowledge presence or absence of significant depression and/or stress, maximize coping skills, provide positive support system. Participant is able to verbalize types and ability to use techniques and skills needed for reducing stress and depression.  Initial Review & Psychosocial Screening:     Initial Psych Review & Screening - 04/01/16 1133      Initial Review   Current issues with --  Not being able to do what he use to do.     Family Dynamics   Good Support System? Yes     Barriers   Psychosocial barriers to participate in program There are no identifiable barriers or psychosocial needs.     Screening Interventions   Interventions Encouraged to exercise      Quality of Life Scores:     Quality of Life -  07/27/16 1539      Quality of Life Scores   Health/Function Pre 17.09 %   Health/Function Post 17.18 %   Health/Function % Change 0.53 %   Socioeconomic Pre 22.5 %   Socioeconomic Post 26.75 %   Socioeconomic % Change  18.89 %   Psych/Spiritual Pre 22.5 %   Psych/Spiritual Post 28.07 %   Psych/Spiritual % Change 24.76 %   Family Pre 24 %   Family Post 25.5 %   Family % Change 6.25 %   GLOBAL Pre 20.18 %   GLOBAL Post 22.56 %   GLOBAL % Change 11.79 %      PHQ-9: Recent Review Flowsheet Data    Depression screen [facility] 2/9 07/28/2016 04/01/2016   Decreased Interest 1 0   Down, Depressed, Hopeless 0 0   PHQ - 2 Score 1 0   Altered sleeping 1 1   Tired, decreased energy 2 1   Change in appetite 0 0   Feeling bad or failure about yourself  0 0   Trouble concentrating 0 0   Moving slowly or fidgety/restless 0 0   Suicidal thoughts 0 0   PHQ-9 Score 4 2   Difficult doing work/chores Somewhat difficult Not difficult at all     Interpretation of Total Score  Total Score Depression Severity:  1-4 = Minimal depression, 5-9 = Mild depression, 10-14 = Moderate depression, 15-19 = Moderately severe depression, 20-27 = Severe depression   Psychosocial Evaluation and Intervention:     Psychosocial Evaluation - 04/01/16 1134      Psychosocial Evaluation & Interventions   Interventions --  No referral needed.    Continue Psychosocial Services  No      Psychosocial Re-Evaluation:     Psychosocial Re-Evaluation    [place] Name 05/12/16 1450 06/09/16 1348 07/08/16 0756         Psychosocial Re-Evaluation   Comments Patient's QOL score was 20.18 and his PHQ-9 score was 2. He continues to have no psychosocial issues identified.  Patient's QOL score was WNL at 20.18. He continue to have no psychosocial issues identified.  Patient psychosocial assessment reveals no barriers to participation in Pulmonary Rehab. Psychosocial areas that are currently affecting patient's rehab experience  include concerns about health issues. Patient doescontinue to exhibit positivecoping skills to deal with his psychosocial concerns. Offered emotional support and reassurance. Patient doesfeel he is making some  progress toward Pulmonary Rehab goals. Patient reports his health and activity level hasimproved in the past 30 days as evidenced by patient's report of increasedability to walk more and do some outside activities. Patient states family/friends havenoticed changes in his activity or mood. Patient reports feeling positive about current and projected progression in Pulmonary Rehab. After reviewing the patient's treatment plan, the patient ismaking progress toward Pulmonary Rehab goals. Patient's rate of progress toward rehab goals is good. Plan of action to help patient continue to work towards rehab goals include continuing to attend Pulmonary Rehab sessions. Will continue to monitor and evaluate progress toward psychosocial goal(s).     Interventions Encouraged to attend Pulmonary Rehabilitation for the exercise Encouraged to attend Pulmonary Rehabilitation for the exercise Encouraged to attend Pulmonary Rehabilitation for the exercise     Continue Psychosocial Services  No No No        Psychosocial Discharge (Final Psychosocial Re-Evaluation):     Psychosocial Re-Evaluation - 07/08/16 0756      Psychosocial Re-Evaluation   Comments Patient psychosocial assessment reveals no barriers to participation in Pulmonary Rehab. Psychosocial areas that are currently affecting patient's rehab experience include concerns about health issues. Patient doescontinue to exhibit positivecoping skills to deal with his psychosocial concerns. Offered emotional support and reassurance. Patient doesfeel he is making some  progress toward Pulmonary Rehab goals. Patient reports his health and activity level hasimproved in the past 30 days as evidenced by patient's report of increasedability to walk more and  do some outside activities. Patient states family/friends havenoticed changes in his activity or mood. Patient reports feeling positive about current and projected progression in Pulmonary Rehab. After reviewing the patient's treatment plan, the patient ismaking progress toward Pulmonary Rehab goals. Patient's rate of progress toward rehab goals is good. Plan of action to help patient continue to work towards rehab goals include continuing to attend Pulmonary Rehab sessions. Will continue to monitor and evaluate progress toward psychosocial goal(s).   Interventions Encouraged to attend Pulmonary Rehabilitation for the exercise   Continue Psychosocial Services  No      Vocational Rehabilitation: Provide vocational rehab assistance to qualifying candidates.   Vocational Rehab Evaluation & Intervention:     Vocational Rehab - 04/01/16 1111      Initial Vocational Rehab Evaluation & Intervention   Assessment shows need for Vocational Rehabilitation No      Education: Education Goals:  Education classes will be provided on a weekly basis, covering required topics. Participant will state understanding/return demonstration of topics presented.  Learning Barriers/Preferences:     Learning Barriers/Preferences - 04/01/16 1048      Learning Barriers/Preferences   Learning Barriers Hearing   Learning Preferences Skilled Demonstration;Individual Instruction;Group Instruction      Education Topics: Hypertension, Hypertension Reduction -Define heart disease and high blood pressure. Discus how high blood pressure affects the body and ways to reduce high blood pressure.   Exercise and Your Heart -Discuss why it is important to exercise, the FITT principles of exercise, normal and abnormal responses to exercise, and how to exercise safely.   Angina -Discuss definition of angina, causes of angina, treatment of angina, and how to decrease risk of having angina.   Cardiac  Medications -Review what the following cardiac medications are used for, how they affect the body, and side effects that may occur when taking the medications.  Medications include Aspirin, Beta blockers, calcium channel blockers, ACE Inhibitors, angiotensin receptor blockers, diuretics, digoxin, and antihyperlipidemics.   Congestive Heart Failure -Discuss the definition of CHF, how to live with CHF, the signs and symptoms of CHF, and how keep track of weight and sodium intake.   Heart Disease and Intimacy -Discus the effect sexual activity has on the heart, how changes occur during intimacy as we age, and safety during sexual activity.   Smoking Cessation / COPD -Discuss different methods to quit smoking, the health benefits of quitting smoking, and the definition of COPD.   Nutrition I: Fats -Discuss the types of cholesterol, what cholesterol does to the heart, and how cholesterol levels can be controlled. Flowsheet Row PULMONARY REHAB CHRONIC OBSTRUCTIVE PULMONARY DISEASE from 07/15/2016 in [place]  Date  (P) 05/13/16  Educator  (P) Lisabeth Register  Instruction Review Code  (P) 2- meets goals/outcomes      Nutrition II: Labels -Discuss the different components of food labels and how to read food label   Heart Parts and Heart Disease -Discuss the anatomy of the heart, the pathway of blood circulation through the heart, and these are affected by heart disease.   Stress I: Signs and Symptoms -Discuss the causes of stress, how stress may lead to anxiety and depression, and ways to limit stress.   Stress II: Relaxation -Discuss different types of relaxation techniques to limit stress.   Warning Signs of Stroke / TIA -Discuss definition of a stroke, what the signs and symptoms are of a stroke, and how to identify when someone is having stroke.   Knowledge Questionnaire Score:     Knowledge Questionnaire Score - 07/28/16 1350      Knowledge  Questionnaire Score   Pre Score 9/14   Post Score 12/14      Core Components/Risk Factors/Patient Goals at Admission:     Personal Goals and Risk Factors at Admission - 04/01/16 1129      Core Components/Risk Factors/Patient Goals on Admission    Weight Management Obesity   Increase Strength and Stamina Yes   Intervention Provide advice, education, support and counseling about physical activity/exercise needs.;Develop an individualized exercise prescription for aerobic and resistive training based on initial evaluation findings, risk stratification, comorbidities and participant's personal goals.   Expected Outcomes Achievement of increased cardiorespiratory fitness and enhanced flexibility, muscular endurance and strength shown through measurements of functional capacity and personal statement of participant.   Improve shortness of breath with ADL's Yes   Intervention Provide education, individualized exercise plan  and daily activity instruction to help decrease symptoms of SOB with activities of daily living.   Expected Outcomes Short Term: Achieves a reduction of symptoms when performing activities of daily living.   Develop more efficient breathing techniques such as purse lipped breathing and diaphragmatic breathing; and practicing self-pacing with activity Yes   Intervention Provide education, demonstration and support about specific breathing techniuqes utilized for more efficient breathing. Include techniques such as pursed lipped breathing, diaphragmatic breathing and self-pacing activity.   Expected Outcomes Short Term: Participant will be able to demonstrate and use breathing techniques as needed throughout daily activities.   Personal Goal Other Yes   Personal Goal To be able to do what he use to do without SOB. Walk outdoors more, and be more active in general.    Intervention Attend PR 2 x week and supplement his exercise 3 x week at home.    Expected Outcomes Reach personal  goals.       Core Components/Risk Factors/Patient Goals Review:      Goals and Risk Factor Review    Row Name 04/01/16 1132 05/12/16 1448 06/09/16 1345 07/08/16 0750 07/28/16 1358     Core Components/Risk Factors/Patient Goals Review   Personal Goals Review Weight Management/Obesity;Increase Strength and Stamina;Improve shortness of breath with ADL's;Develop more efficient breathing techniques such as purse lipped breathing and diaphragmatic breathing and practicing self-pacing with activity. Weight Management/Obesity;Increase Strength and Stamina;Improve shortness of breath with ADL's;Develop more efficient breathing techniques such as purse lipped breathing and diaphragmatic breathing and practicing self-pacing with activity.  To be able to do what he use to do without SOB. Walk outdoors more, and be more active in general.  Weight Management/Obesity;Increase Strength and Stamina;Improve shortness of breath with ADL's;Develop more efficient breathing techniques such as purse lipped breathing and diaphragmatic breathing and practicing self-pacing with activity.  To be able to do what he use to do without SOB. Walk outdoors more, and be more active in general.  Weight Management/Obesity;Increase Strength and Stamina;Develop more efficient breathing techniques such as purse lipped breathing and diaphragmatic breathing and practicing self-pacing with activity.;Improve shortness of breath with ADL's  Be able to do what he used to do without SOB; walk more outside; be more active. Weight Management/Obesity;Improve shortness of breath with ADL's;Develop more efficient breathing techniques such as purse lipped breathing and diaphragmatic breathing and practicing self-pacing with activity.   Review  - Patient has attended 9 sessions with some progress and some increased strength and stamina. He has maintained his weight. Will continue to monitor for progress.  Patient has attended 13 sessions. His last  session attended was 05/25/16. He has not been able to come d/t COPD excerbation. He has progressed well in the program with some improvement in his strength and stamina and SOB. He has lost 10 lbs since starting the program.  Patient has attended 19 sessions. He has lost 2 lbs since starting the program. He is progressing well with increased strength and stamina and some improvement in SOB. He says the program is helping him.  Upon graduation, patient lost 2 lbs. His waist and hips decreased in size. He says he did not meet his personal goals in the program. Patient has had 2 COPD exacerbations since starting the program which hindered his progress.  He did progress in the program with some increased strength and improvement in his SOB.    Expected Outcomes  - Patient will continue to attend sessions meeting his personal goals.  Patient will continue to  attend sessions and complete the program meeting his personal goals.  Patient will complete the program meeting his personal goals.  Patient plans to join maintenance 2 days/week and will work toward meeting his personal goasl.       Core Components/Risk Factors/Patient Goals at Discharge (Final Review):      Goals and Risk Factor Review - 07/28/16 1358      Core Components/Risk Factors/Patient Goals Review   Personal Goals Review Weight Management/Obesity;Improve shortness of breath with ADL's;Develop more efficient breathing techniques such as purse lipped breathing and diaphragmatic breathing and practicing self-pacing with activity.   Review Upon graduation, patient lost 2 lbs. His waist and hips decreased in size. He says he did not meet his personal goals in the program. Patient has had 2 COPD exacerbations since starting the program which hindered his progress.  He did progress in the program with some increased strength and improvement in his SOB.    Expected Outcomes Patient plans to join maintenance 2 days/week and will work toward meeting his  personal goasl.       ITP Comments:     ITP Comments    Row Name 04/01/16 1120 04/14/16 1418         ITP Comments Mr. Tinnon is an 83 yr old gentleman that was here for his initial visit. He attended the basic heart healthy group nutrition class today 04/01/16 with dietician. He went over principles of rating your plate and how to plan and eat a heart healthy meal. The importance of keeping your weight down, how to choose healthy meals while dining out.  Patient new to program. Has attended 3 sessions. Will continue to monitor for progress.          Comments: Patient graduated from Pulmonary Rehabilitation today on 07/27/16 after completing 24 sessions. He achieved LTG of 30 minutes of aerobic exercise at Max Met level of 2.3. All patients vitals are WNL. Patient has met with dietician. Discharge instruction has been reviewed in detail and patient stated an understanding of material given. Patient plans to join maintenance. Cardiac Rehab staff will make f/u calls at 1 month, 6 months, and 1 year. Patient had no complaints of any abnormal S/S or pain on their exit visit.

## 2016-08-03 ENCOUNTER — Telehealth: Payer: Self-pay | Admitting: Internal Medicine

## 2016-08-03 MED ORDER — APIXABAN 5 MG PO TABS: 5.0000 mg | ORAL_TABLET | Freq: Two times a day (BID) | ORAL | 1 refills | Status: DC

## 2016-08-03 NOTE — Telephone Encounter (Signed)
Spoke with pt. He needs a refill on Eliquis sent to his local pharmacy. This has been taken care of. Nothing further was needed.

## 2016-08-11 ENCOUNTER — Telehealth: Payer: Self-pay | Admitting: Internal Medicine

## 2016-08-11 ENCOUNTER — Other Ambulatory Visit: Payer: Self-pay | Admitting: Internal Medicine

## 2016-08-11 MED ORDER — APIXABAN 5 MG PO TABS: 5.0000 mg | ORAL_TABLET | Freq: Two times a day (BID) | ORAL | 1 refills | Status: DC

## 2016-08-11 NOTE — Telephone Encounter (Signed)
lmtcb x1 for pt. Need to verify his pharmacy.

## 2016-08-11 NOTE — Telephone Encounter (Signed)
Pharmacy is [facility] on [address] in [place].

## 2016-08-11 NOTE — Telephone Encounter (Signed)
Rx has been sent in per pt's request. I have left a message with the pt making him aware of this. Nothing further was needed.

## 2016-08-31 ENCOUNTER — Telehealth: Payer: Self-pay | Admitting: Internal Medicine

## 2016-08-31 NOTE — Telephone Encounter (Addendum)
Spoke with pt, who states he is having increased sob. Pt is scheduled with RA on 09/01/16 @ 11:15 for acute visit. Nothing further needed.

## 2016-09-01 ENCOUNTER — Telehealth: Payer: Self-pay | Admitting: Pulmonary Disease

## 2016-09-01 ENCOUNTER — Encounter: Payer: Self-pay | Admitting: Pulmonary Disease

## 2016-09-01 ENCOUNTER — Ambulatory Visit (INDEPENDENT_AMBULATORY_CARE_PROVIDER_SITE_OTHER): Payer: Medicare Other | Admitting: Pulmonary Disease

## 2016-09-01 DIAGNOSIS — J441 Chronic obstructive pulmonary disease with (acute) exacerbation: Secondary | ICD-10-CM

## 2016-09-01 DIAGNOSIS — G4733 Obstructive sleep apnea (adult) (pediatric): Secondary | ICD-10-CM

## 2016-09-01 MED ORDER — AZITHROMYCIN 250 MG PO TABS: ORAL_TABLET | ORAL | 0 refills | Status: DC

## 2016-09-01 MED ORDER — BUDESONIDE-FORMOTEROL FUMARATE 160-4.5 MCG/ACT IN AERO: 2.0000 | INHALATION_SPRAY | Freq: Two times a day (BID) | RESPIRATORY_TRACT | 0 refills | Status: DC

## 2016-09-01 MED ORDER — PREDNISONE 10 MG PO TABS: ORAL_TABLET | ORAL | 0 refills | Status: DC

## 2016-09-01 NOTE — Telephone Encounter (Signed)
ATC, placed on long hold. WCB

## 2016-09-01 NOTE — Addendum Note (Signed)
Addended by: Valerie Salts on: 09/01/2016 11:46 AM   Modules accepted: Orders

## 2016-09-01 NOTE — Assessment & Plan Note (Signed)
  He will bring in the card

## 2016-09-01 NOTE — Assessment & Plan Note (Signed)
We'll treat as exacerbation Z-Pak. Prednisone 10 mg tabs  Take 2 tabs daily with food x 5ds, then 1 tab daily with food x 5ds then STOP  Continue on Symbicort and in [place]- he has various allergies to Spiriva and [employer]

## 2016-09-01 NOTE — Patient Instructions (Signed)
Z-Pak. Prednisone 10 mg tabs  Take 2 tabs daily with food x 5ds, then 1 tab daily with food x 5ds then STOP  Bring in the card so we can check report machine and decrease settings as required If necessary, we can set up CPAP titration study

## 2016-09-01 NOTE — Progress Notes (Signed)
   Subjective:    Patient ID: Jon White, male    DOB: 1933/03/06, 83 y.o.   MRN: [id number]  HPI  83 year old for follow-up of moderate COPD & OSA Maintained on Symbicort and in [place]- he has various allergies to Spiriva and Breo  He has a history of right lower lobectomy in the remote past for benign lesion  Chief Complaint  Patient presents with  . Shortness of Breath    Increased SOB for the past month. Increased coughing.    He reports increased dyspnea for the last 1 month with increased sputum production up, occasionally yellow. And intermittent wheezing He denies chest pain, pedal edema or orthopnea paroxysmal nocturnal dyspnea. He was given doxycycline and prednisone for 5 days in 07/2016 and this gave him relief for the days that he was on it.  He also has problems with CPAP machine. He feels that the pressure is too high with his full face mask, has a mask on very tight and does not have a leak   Significant tests/ events  PFTs 2014 FEV1 42%-1.08, significant bronchodilator response with improvement to 59%  NPSG 2008:  AHI 11/hr, but no supine sleep.  Titration study:  cpap 8cm.  Auto 06/2011:  Optimal pressure 12cm.  Review of Systems neg for any significant sore throat, dysphagia, itching, sneezing, nasal congestion or excess/ purulent secretions, fever, chills, sweats, unintended wt loss, pleuritic or exertional cp, hempoptysis, orthopnea pnd or change in chronic leg swelling.   Also denies presyncope, palpitations, heartburn, abdominal pain, nausea, vomiting, diarrhea or change in bowel or urinary habits, dysuria,hematuria, rash, arthralgias, visual complaints, headache, numbness weakness or ataxia.     Objective:   Physical Exam  Gen. Pleasant, well-nourished, in no distress ENT - no lesions, no post nasal drip Neck: No JVD, no thyromegaly, no carotid bruits Lungs: no use of accessory muscles, no dullness to percussion, decreased BS without rales or rhonchi   Cardiovascular: Rhythm regular, heart sounds  normal, no murmurs or gallops, no peripheral edema Musculoskeletal: No deformities, no cyanosis or clubbing        Assessment & Plan:

## 2016-09-02 ENCOUNTER — Telehealth: Payer: Self-pay | Admitting: Internal Medicine

## 2016-09-02 DIAGNOSIS — G4733 Obstructive sleep apnea (adult) (pediatric): Secondary | ICD-10-CM

## 2016-09-02 NOTE — Telephone Encounter (Signed)
rx should have been prescribed as regular zpak- 2 tabs on day 1, 1 tab on days 2-5.  Spoke with pharmacy to clarify.  Nothing further needed.

## 2016-09-02 NOTE — Telephone Encounter (Signed)
Download taken off of sd card and placed in RA's lookat as requested at yesterday's office visit.  RA please advise. Thanks!

## 2016-09-06 NOTE — Telephone Encounter (Signed)
RA please advise of download. thanks

## 2016-09-07 ENCOUNTER — Ambulatory Visit: Payer: Medicare Other | Admitting: Pulmonary Disease

## 2016-09-07 NOTE — Telephone Encounter (Signed)
Drop CPAP pressure to 10 cm and repeat download in one month

## 2016-09-07 NOTE — Telephone Encounter (Signed)
Spoke with patient regarding pressure change. Pt verbalized understanding.   Order has been placed and pt is aware.

## 2016-09-07 NOTE — Telephone Encounter (Signed)
Spoke with patients about results. Pt. Verbalized understanding. Pt wanted to know if he could have the settings lower. He is still having issues with air blowing in his face and making noise at night.

## 2016-09-07 NOTE — Telephone Encounter (Signed)
Download reviewed Excellent result on 11 cm CPAP, no residual events, mild leak which is acceptable Stay on same setting

## 2016-09-15 DIAGNOSIS — M8580 Other specified disorders of bone density and structure, unspecified site: Secondary | ICD-10-CM | POA: Diagnosis not present

## 2016-09-15 DIAGNOSIS — I2510 Atherosclerotic heart disease of native coronary artery without angina pectoris: Secondary | ICD-10-CM | POA: Diagnosis not present

## 2016-09-15 DIAGNOSIS — M859 Disorder of bone density and structure, unspecified: Secondary | ICD-10-CM | POA: Diagnosis not present

## 2016-09-15 DIAGNOSIS — Z0000 Encounter for general adult medical examination without abnormal findings: Secondary | ICD-10-CM | POA: Diagnosis not present

## 2016-09-15 DIAGNOSIS — E782 Mixed hyperlipidemia: Secondary | ICD-10-CM | POA: Diagnosis not present

## 2016-09-21 DIAGNOSIS — I2510 Atherosclerotic heart disease of native coronary artery without angina pectoris: Secondary | ICD-10-CM | POA: Diagnosis not present

## 2016-09-21 DIAGNOSIS — R7301 Impaired fasting glucose: Secondary | ICD-10-CM | POA: Diagnosis not present

## 2016-09-21 DIAGNOSIS — N183 Chronic kidney disease, stage 3 (moderate): Secondary | ICD-10-CM | POA: Diagnosis not present

## 2016-09-21 DIAGNOSIS — E782 Mixed hyperlipidemia: Secondary | ICD-10-CM | POA: Diagnosis not present

## 2016-09-27 ENCOUNTER — Ambulatory Visit (INDEPENDENT_AMBULATORY_CARE_PROVIDER_SITE_OTHER): Payer: Medicare Other | Admitting: Internal Medicine

## 2016-09-27 ENCOUNTER — Encounter: Payer: Self-pay | Admitting: Internal Medicine

## 2016-09-27 DIAGNOSIS — J449 Chronic obstructive pulmonary disease, unspecified: Secondary | ICD-10-CM

## 2016-09-27 MED ORDER — FLUTICASONE-UMECLIDIN-VILANT 100-62.5-25 MCG/INH IN AEPB
1.0000 | INHALATION_SPRAY | Freq: Every day | RESPIRATORY_TRACT | 11 refills | Status: DC
Start: 2016-09-27 — End: 2016-10-08

## 2016-09-27 NOTE — Assessment & Plan Note (Signed)
Stable diseas but have hoarseness of voice and difficulty bringing out mucus  Plan - Stop Symbicort and tudorza for the next 2 weeks - Instead give a trial of sample TRELEGY once daily  - Call us back with response Use albuterol as needed - Use over-the-counter Mucinex for sputum clearance - Keep up with ENT appointment for hoarseness of voice  Follow-up 5-6 months or sooner if needed

## 2016-09-27 NOTE — Progress Notes (Signed)
Subjective:     Patient ID: Jon White, male   DOB: 1932-10-18, 83 y.o.   MRN: [id number]  HPI  OV 09/27/2016  Chief Complaint  Patient presents with  . Follow-up    Pt saw RA for an acute visit on 4.4.2018 and states he feels improved but not back to baseline. Pt c/o prod cough with yellow mucus in morning and color lightens up throughout day. Pt denies CP/tightness and f/c/s.    83 year old with moderate COPD based on 2014 PFT. He presents with his wife. 3 weeks ago he did have a PD exacerbation seen in our office and treated with antibiotic and prednisone. Since then he's feeling fine except that he has hoarseness of voice but he has a ENT appointment set up. He also has difficulty bringing out his sputum for which she has tried Mucinex which is helped. Otherwise he's stable. He is seeing a lot of TV ads for new COPD inhalers and wants to try something different. Otherwise no new issues.   has a past medical history of Allergic rhinitis; Allergy; Asthma; Atrial fibrillation ([place]); Bleeding gastric ulcer; Blood transfusion without reported diagnosis; Cataract; Chronic renal insufficiency, stage III (moderate); COPD (chronic obstructive pulmonary disease) ([place]); Diabetes mellitus without complication ([place]); Duodenum ulcer; DVT of axillary vein, acute left ([place]) (10/24/2014); Emphysema; GERD (gastroesophageal reflux disease); H/O hiatal hernia; High cholesterol; Hypertension; Mixed hyperlipidemia; Myocardial infarction ([place]); Neuromuscular disorder ([place]); Normal cardiac stress test; OSA (obstructive sleep apnea) (2007); PE (pulmonary embolism) (10/2011); Prostate cancer ([place]) (1992); Recurrent upper respiratory infection (URI); Renal stone; Shortness of breath on exertion; and Sleep apnea.   reports that he quit smoking about 5 years ago. His smoking use included Cigarettes. He has a 60.00 pack-year smoking history. He has never used smokeless tobacco.  Past Surgical History:  Procedure  Laterality Date  . CATARACT EXTRACTION W/ INTRAOCULAR LENS  IMPLANT, BILATERAL  ~ 2000  . COLONOSCOPY N/A 04/03/2014   Procedure: COLONOSCOPY;  Surgeon: Inda Castle, MD;  Location: [facility];  Service: Endoscopy;  Laterality: N/A;  . ESOPHAGOGASTRODUODENOSCOPY N/A 08/28/2013   Procedure: ESOPHAGOGASTRODUODENOSCOPY (EGD);  Surgeon: Gayland Curry, MD;  Location: [facility];  Service: General;  Laterality: N/A;  . ESOPHAGOGASTRODUODENOSCOPY N/A 04/03/2014   Procedure: ESOPHAGOGASTRODUODENOSCOPY (EGD);  Surgeon: Inda Castle, MD;  Location: [facility];  Service: Endoscopy;  Laterality: N/A;  . GASTROSTOMY N/A 08/28/2013   Procedure: GASTROSTOMY;  Surgeon: Rolm Bookbinder, MD;  Location: [facility];  Service: General;  Laterality: N/A;  . LAPAROTOMY N/A 08/28/2013   Procedure: EXPLORATORY LAPAROTOMY;  Surgeon: Rolm Bookbinder, MD;  Location: [facility];  Service: General;  Laterality: N/A;  . LOBECTOMY Right 1986   1/3 removed   . LUNG SURGERY  1986   presumed from RLL. Segmentectomy. Benign Nodule.   [name] Kitchen MAXILLARY ANTROSTOMY  09/15/2011   Procedure: MAXILLARY ANTROSTOMY;  Surgeon: Ascencion Dike, MD;  [facility];  Service: ENT;  Laterality: Bilateral;  TOTAL MAXILLARY ANTROSTOMY  . MELANOMA EXCISION Left    under left eye  . REPAIR OF PERFORATED ULCER N/A 08/28/2013   Procedure: DUODENAL ULCER PATCH;  Surgeon: Rolm Bookbinder, MD;  Location: [facility];  Service: General;  Laterality: N/A;  . SEPTOPLASTY  09/15/11  . SEPTOPLASTY  09/15/2011   Procedure: SEPTOPLASTY;  Surgeon: Ascencion Dike, MD;  Location: [facility];  Service: ENT;  Laterality: Bilateral;  . SINUS ENDO W/FUSION  sch. 09/15/2011   Procedure: ENDOSCOPIC SINUS SURGERY WITH FUSION NAVIGATION;  Surgeon: Evelena Peat  Cassie Freer, MD;  Location: [facility];  Service: ENT;  Laterality: Bilateral;  bilateral maxillary antrostomy, bilateral endoscopic ethmoidectomy, bilateral sphenoidectomy, bilateral frontal recess exploration  . SINUS ENDO W/FUSION   09/15/2011   Procedure: ENDOSCOPIC SINUS SURGERY WITH FUSION NAVIGATION;  Surgeon: Ascencion Dike, MD;  Location: [facility];  Service: ENT;  Laterality: Bilateral;  . SINUS EXPLORATION  09/15/2011   Procedure: SINUS EXPLORATION;  Surgeon: Ascencion Dike, MD;  Location: [facility];  Service: ENT;  Laterality: Bilateral;  FRONTAL RECESS EXPLORATION  . SPHENOIDECTOMY  09/15/2011   Procedure: Coralee Pesa;  Surgeon: Ascencion Dike, MD;  Location: [facility];  Service: ENT;  Laterality: Bilateral;  . URETEROSCOPY  1980's    Allergies  Allergen Reactions  . Mometasone Furoate Other (See Comments)    Nasonex: CAUSED RED,ITCHY EYES; "lost my eyelashes"   . Naproxen Sodium Nausea And Vomiting  . Nsaids Other (See Comments)    Stomach ulcers  . Anoro Ellipta [Umeclidinium-Vilanterol] Other (See Comments)    Double vision  . Aspirin Hives and Nausea And Vomiting     Chills, sweats  . Breo Ellipta [Fluticasone Furoate-Vilanterol] Hives  . Dexilant [Dexlansoprazole] Diarrhea    Cramps   . Omnaris [Ciclesonide] Other (See Comments)    Nose bleed   . Spiriva Handihaler [Tiotropium Bromide Monohydrate] Other (See Comments)    Hiccups     Immunization History  Administered Date(s) Administered  . Influenza Split 02/26/2013, 02/21/2014, 02/04/2015  . Influenza Whole 03/31/2012  . Influenza,inj,Quad PF,36+ Mos 02/04/2016  . Pneumococcal Polysaccharide-23 08/30/2003, 07/30/2014    Family History  Problem Relation Age of Onset  . Asthma Father   . Heart disease Mother     MI age 15  . Heart attack Mother   . Prostate cancer Brother   . Prostate cancer Brother   . Prostate cancer Brother   . Stroke Sister   . Anesthesia problems Neg Hx   . Colon cancer Neg Hx   . Esophageal cancer Neg Hx   . Rectal cancer Neg Hx   . Stomach cancer Neg Hx      Current Outpatient Prescriptions:  .  Aclidinium Bromide (TUDORZA PRESSAIR) 400 MCG/ACT AEPB, Inhale 1 puff into the lungs 2 (two) times daily., Disp: 1 each, Rfl: 5 .   apixaban (ELIQUIS) 5 MG TABS tablet, Take 1 tablet (5 mg total) by mouth 2 (two) times daily., Disp: 180 tablet, Rfl: 1 .  atorvastatin (LIPITOR) 10 MG tablet, Take 10 mg by mouth daily at 6 PM. , Disp: , Rfl:  .  budesonide-formoterol (SYMBICORT) 160-4.5 MCG/ACT inhaler, Inhale 2 puffs into the lungs 2 (two) times daily., Disp: 1 Inhaler, Rfl: 0 .  esomeprazole (NEXIUM) 40 MG capsule, Take 1 capsule (40 mg total) by mouth daily., Disp: 90 capsule, Rfl: 6 .  fluocinonide cream (LIDEX) 9.79 %, Apply 1 application topically daily as needed (rash). To infected area., Disp: , Rfl:  .  fluticasone (FLONASE) 50 MCG/ACT nasal spray, Place 2 sprays into the nose at bedtime. Each nostril once a day., Disp: , Rfl:  .  furosemide (LASIX) 40 MG tablet, TAKE 1 TABLET(40 MG) BY MOUTH DAILY, Disp: 90 tablet, Rfl: 2 .  metoprolol tartrate (LOPRESSOR) 25 MG tablet, TAKE 1/2 TABLET BY MOUTH TWICE DAILY, Disp: 30 tablet, Rfl: 5 .  nitroGLYCERIN (NITROSTAT) 0.4 MG SL tablet, Place 1 tablet (0.4 mg total) under the tongue every 5 (five) minutes as needed for chest pain., Disp: 75 tablet, Rfl:  3 .  phenol (CHLORASEPTIC) 1.4 % LIQD, Use as directed 1 spray in the mouth or throat as needed for throat irritation / pain., Disp: , Rfl:  .  PROAIR HFA 108 (90 Base) MCG/ACT inhaler, INHALE 2 PUFFS BY MOUTH EVERY 6 HOURS AS NEEDED FOR WHEEZING OR SHORTNESS OF BREATH, Disp: 8.5 g, Rfl: 5 .  tamsulosin (FLOMAX) 0.4 MG CAPS capsule, Take 0.4 mg by mouth daily., Disp: , Rfl:     Review of Systems     Objective:   Physical Exam  Constitutional: He is oriented to person, place, and time. He appears well-developed and well-nourished. No distress.  Hoarse voice  HENT:  Head: Normocephalic and atraumatic.  Right Ear: External ear normal.  Left Ear: External ear normal.  Mouth/Throat: Oropharynx is clear and moist. No oropharyngeal exudate.  Eyes: Conjunctivae and EOM are normal. Pupils are equal, round, and reactive to light.  Right eye exhibits no discharge. Left eye exhibits no discharge. No scleral icterus.  Neck: Normal range of motion. Neck supple. No JVD present. No tracheal deviation present. No thyromegaly present.  Cardiovascular: Normal rate, regular rhythm and intact distal pulses.  Exam reveals no gallop and no friction rub.   No murmur heard. Pulmonary/Chest: Effort normal and breath sounds normal. No respiratory distress. He has no wheezes. He has no rales. He exhibits no tenderness.  Abdominal: Soft. Bowel sounds are normal. He exhibits no distension and no mass. There is no tenderness. There is no rebound and no guarding.  Musculoskeletal: Normal range of motion. He exhibits no edema or tenderness.  Lymphadenopathy:    He has no cervical adenopathy.  Neurological: He is alert and oriented to person, place, and time. He has normal reflexes. No cranial nerve deficit. Coordination normal.  Skin: Skin is warm and dry. No rash noted. He is not diaphoretic. No erythema. No pallor.  Psychiatric: He has a normal mood and affect. His behavior is normal. Judgment and thought content normal.  Nursing note and vitals reviewed.  Vitals:   09/27/16 1143  BP: 118/72  Pulse: 72  SpO2: 97%  Weight: 169 lb 14.4 oz (77.1 kg)  Height: 5\' 8"  (1.727 m)    Estimated body mass index is 25.83 kg/m as calculated from the following:   Height as of this encounter: 5\' 8"  (1.727 m).   Weight as of this encounter: 169 lb 14.4 oz (77.1 kg).      Assessment:       ICD-9-CM ICD-10-CM   1. Chronic obstructive pulmonary disease, unspecified COPD type ([place]) 496 J44.9        Plan:     COPD (chronic obstructive pulmonary disease) Stable diseas but have hoarseness of voice and difficulty bringing out mucus  Plan - Stop Symbicort and tudorza for the next 2 weeks - Instead give a trial of sample TRELEGY once daily  - Call us back with response Use albuterol as needed - Use over-the-counter Mucinex for sputum  clearance - Keep up with ENT appointment for hoarseness of voice  Follow-up 5-6 months or sooner if needed    Dr. Brand Males, M.D., [facility].C.P Pulmonary and Critical Care Medicine Staff Physician [place] [facility] Pager: [phone], If no answer or between  15:00h - 7:00h: call [phone]  09/27/2016 12:09 PM

## 2016-09-27 NOTE — Patient Instructions (Addendum)
   COPD (chronic obstructive pulmonary disease) Stable diseas but have hoarseness of voice and difficulty bringing out mucus  Plan - Stop Symbicort and tudorza for the next 2 weeks - Instead give a trial of sample TRELEGY once daily  - Call us back with response Use albuterol as needed - Use over-the-counter Mucinex for sputum clearance - Keep up with ENT appointment for hoarseness of voice  Follow-up 5-6 months or sooner if needed

## 2016-10-04 ENCOUNTER — Telehealth: Payer: Self-pay | Admitting: Internal Medicine

## 2016-10-04 NOTE — Telephone Encounter (Signed)
I have spoken with pt, who states trelegy is not covered by insurance. Pt is aware to contact his insurance company for a medication formulary, and to contact us once received.  Pt voiced his understanding and had no further questions. Noting further needed.

## 2016-10-05 ENCOUNTER — Telehealth: Payer: Self-pay | Admitting: Internal Medicine

## 2016-10-05 MED ORDER — GLYCOPYRROLATE-FORMOTEROL 9-4.8 MCG/ACT IN AERO
2.0000 | INHALATION_SPRAY | Freq: Two times a day (BID) | RESPIRATORY_TRACT | 0 refills | Status: DC
Start: 2016-10-05 — End: 2016-10-11

## 2016-10-05 MED ORDER — BUDESONIDE 90 MCG/ACT IN AEPB
2.0000 | INHALATION_SPRAY | Freq: Two times a day (BID) | RESPIRATORY_TRACT | 0 refills | Status: DC
Start: 2016-10-05 — End: 2016-10-05

## 2016-10-05 MED ORDER — BUDESONIDE 90 MCG/ACT IN AEPB: 2.0000 | INHALATION_SPRAY | Freq: Two times a day (BID) | RESPIRATORY_TRACT | 0 refills | Status: DC

## 2016-10-05 NOTE — Telephone Encounter (Signed)
pulminocrt 90 x 2 puff bid  Dr. Brand Males, M.D., [facility].C.P Pulmonary and Critical Care Medicine Staff Physician [place] [facility] Pager: [phone], If no answer or between  15:00h - 7:00h: call [phone]  10/05/2016 4:10 PM

## 2016-10-05 NOTE — Telephone Encounter (Signed)
Pt is aware samples have been placed up front. He had no further questions. Nothing further is needed

## 2016-10-05 NOTE — Telephone Encounter (Signed)
MR  Please Advise-  Which strength of Pulmicort 90 or 160

## 2016-10-05 NOTE — Telephone Encounter (Signed)
Actually he has allergy to anoro and and breo. So, trelegy wont work. Please give him samples of BEVESPI and Pulmicort - both 2 puff bid. ANd have him call back in few weeks to see if this works bettter. IF so, then we cna do prior auth  Dr. Brand Males, M.D., [facility].C.P Pulmonary and Critical Care Medicine Staff Physician [place] [facility] and [facility] Pager: [phone], If no answer or between  15:00h - 7:00h: call [phone]  10/05/2016 3:30 PM

## 2016-10-05 NOTE — Telephone Encounter (Signed)
Called and spoke with the pharmacy and they stated that the trelegy is costing the pt $600 and the anoro is the covered alternative.  They could not give me the price, but did state that this is preferred by his insurance.  MR please advise. Thanks  Allergies  Allergen Reactions  . Mometasone Furoate Other (See Comments)    Nasonex: CAUSED RED,ITCHY EYES; "lost my eyelashes"   . Naproxen Sodium Nausea And Vomiting  . Nsaids Other (See Comments)    Stomach ulcers  . Anoro Ellipta [Umeclidinium-Vilanterol] Other (See Comments)    Double vision  . Aspirin Hives and Nausea And Vomiting     Chills, sweats  . Breo Ellipta [Fluticasone Furoate-Vilanterol] Hives  . Dexilant [Dexlansoprazole] Diarrhea    Cramps   . Omnaris [Ciclesonide] Other (See Comments)    Nose bleed   . Spiriva Handihaler [Tiotropium Bromide Monohydrate] Other (See Comments)    Hiccups

## 2016-10-05 NOTE — Telephone Encounter (Signed)
MR  Please Advise-   When I went to enter the sample for budesonide it is showing allergies 4 allergies: Umeclidinium-vilanterol,Fluticasone,mometasone,ciclesonide. Please advise if ok for pt to use inhaler   FYI to nurse- sample of bevespi is up front   Allergies  Allergen Reactions  . Mometasone Furoate Other (See Comments)    Nasonex: CAUSED RED,ITCHY EYES; "lost my eyelashes"   . Naproxen Sodium Nausea And Vomiting  . Nsaids Other (See Comments)    Stomach ulcers  . Anoro Ellipta [Umeclidinium-Vilanterol] Other (See Comments)    Double vision  . Aspirin Hives and Nausea And Vomiting     Chills, sweats  . Breo Ellipta [Fluticasone Furoate-Vilanterol] Hives  . Dexilant [Dexlansoprazole] Diarrhea    Cramps   . Omnaris [Ciclesonide] Other (See Comments)    Nose bleed   . Spiriva Handihaler [Tiotropium Bromide Monohydrate] Other (See Comments)    Hiccups

## 2016-10-05 NOTE — Telephone Encounter (Signed)
pulmicort is budesonide. So ok to try  Dr. Brand Males, M.D., [facility].C.P Pulmonary and Critical Care Medicine Staff Physician [place] [facility] and Critical [facility] Pager: [phone], If no answer or between  15:00h - 7:00h: call [phone]  10/05/2016 5:04 PM

## 2016-10-08 ENCOUNTER — Telehealth: Payer: Self-pay | Admitting: Internal Medicine

## 2016-10-08 MED ORDER — ALBUTEROL SULFATE HFA 108 (90 BASE) MCG/ACT IN AERS: 2.0000 | INHALATION_SPRAY | Freq: Four times a day (QID) | RESPIRATORY_TRACT | 2 refills | Status: DC | PRN

## 2016-10-08 NOTE — Telephone Encounter (Signed)
Called and spoke with pt and he stated that the proair is not helping him at all.  He stated that he woke up at 4:30 this morning and took 2 puffs and it took him about 2 hours to actually get some relief.  He is requesting to have another inhaler to try.   Pt also wanted to know if he is to take the bevespi and pulmicort.  He didn't want to take the medications if he does not need both.  MR please advise of both questions. Thanks  Allergies  Allergen Reactions  . Mometasone Furoate Other (See Comments)    Nasonex: CAUSED RED,ITCHY EYES; "lost my eyelashes"   . Naproxen Sodium Nausea And Vomiting  . Nsaids Other (See Comments)    Stomach ulcers  . Anoro Ellipta [Umeclidinium-Vilanterol] Other (See Comments)    Double vision  . Aspirin Hives and Nausea And Vomiting     Chills, sweats  . Breo Ellipta [Fluticasone Furoate-Vilanterol] Hives  . Dexilant [Dexlansoprazole] Diarrhea    Cramps   . Omnaris [Ciclesonide] Other (See Comments)    Nose bleed   . Spiriva Handihaler [Tiotropium Bromide Monohydrate] Other (See Comments)    Hiccups

## 2016-10-08 NOTE — Telephone Encounter (Signed)
Called and spoke with pt and he is aware of the ventolin HFA that has been sent to the pharmacy.  Nothing further is needed.

## 2016-10-08 NOTE — Telephone Encounter (Signed)
The pro-air is rescue. IF that does not work he can try ventolin mdi prn  REgrading maintenance  -= he is usually on spiriva and symbicport and seeing TV ads he wnted to try TRELEGY but is not covered. So, just to see if something else would work I advised bevespi and pulmicort both 2 puff bid. This is substitute for spiriva and symbicort scheduled meds. He is welcome to try  Dr. Brand Males, M.D., [facility].C.P Pulmonary and Critical Care Medicine Staff Physician [facility] Pager: [phone], If no answer or between  15:00h - 7:00h: call [phone]  10/08/2016 4:16 PM

## 2016-10-11 ENCOUNTER — Telehealth: Payer: Self-pay | Admitting: Internal Medicine

## 2016-10-11 DIAGNOSIS — R05 Cough: Secondary | ICD-10-CM

## 2016-10-11 DIAGNOSIS — J328 Other chronic sinusitis: Secondary | ICD-10-CM

## 2016-10-11 DIAGNOSIS — R058 Other specified cough: Secondary | ICD-10-CM

## 2016-10-11 MED ORDER — PREDNISONE 10 MG PO TABS: ORAL_TABLET | ORAL | 0 refills | Status: DC

## 2016-10-11 NOTE — Telephone Encounter (Signed)
Per MR -  Put Bevepsi and Pulmicort on allergy list and resume Symbicort and Spiriva. Also, send in prednisone 40mg  daily x 1 day, then 30mg  x 1 day, then 20mg  x 1 day, then 10mg  daily x 1 day, then 5mg  daily x 1 day then stop.

## 2016-10-11 NOTE — Telephone Encounter (Signed)
Pt states he started breaking hives, redness on his legs, whole body itching last night with Chest pain. This all started after taking Bevespi and Pulmicort. Pt stopped taking Bevespi and Pulmicort-he went back to Symbicort.  Pt is on Eliquis 5mg  QD and has increased amount of red patchy areas on his arms.   Pt would like to know what to take to help and where to go from here regarding his inhalers.   Pt would like to try Pulmicort once daily with Symbicort BID instead-would like MR's suggestions on this.

## 2016-10-11 NOTE — Telephone Encounter (Signed)
Spoke with pt and made him aware of below recommendations.  Pt states he can not take spiriva, as he was previously prescribed spiriva in 2013 during admission and had hiccups for 5 days. Bevespi and Pulmicort has been added to allergy list.  Rx for prednisone has been sent to preferred pharmacy.   MR please advise. Thanks.

## 2016-10-11 NOTE — Telephone Encounter (Signed)
Spoke with pt, aware of recs.  Referral for allergist placed.  Nothing further needed.

## 2016-10-11 NOTE — Telephone Encounter (Deleted)
bevespi and pulmicort has been added to allergy list. Just to clarify before calling pt, symbicort 160 and spiriva 29mcg?  MR please advise. Thanks.

## 2016-10-11 NOTE — Telephone Encounter (Signed)
I also recommend allergy referral because he has allergy to so many inhalers Go to ER if worse  Dr. Brand Males, M.D., [facility].C.P Pulmonary and Critical Care Medicine Staff Physician [place] Pager: [phone], If no answer or between  15:00h - 7:00h: call [phone]  10/11/2016 2:13 PM

## 2016-10-12 DIAGNOSIS — J3089 Other allergic rhinitis: Secondary | ICD-10-CM | POA: Diagnosis not present

## 2016-10-12 DIAGNOSIS — J4530 Mild persistent asthma, uncomplicated: Secondary | ICD-10-CM | POA: Diagnosis not present

## 2016-10-12 DIAGNOSIS — R21 Rash and other nonspecific skin eruption: Secondary | ICD-10-CM | POA: Diagnosis not present

## 2016-10-12 DIAGNOSIS — J339 Nasal polyp, unspecified: Secondary | ICD-10-CM | POA: Diagnosis not present

## 2016-10-13 DIAGNOSIS — J330 Polyp of nasal cavity: Secondary | ICD-10-CM | POA: Diagnosis not present

## 2016-10-13 DIAGNOSIS — J310 Chronic rhinitis: Secondary | ICD-10-CM | POA: Diagnosis not present

## 2016-10-27 ENCOUNTER — Other Ambulatory Visit: Payer: Self-pay

## 2016-10-27 ENCOUNTER — Ambulatory Visit (HOSPITAL_COMMUNITY): Payer: Medicare Other | Attending: Cardiovascular Disease

## 2016-10-27 DIAGNOSIS — Z8249 Family history of ischemic heart disease and other diseases of the circulatory system: Secondary | ICD-10-CM | POA: Diagnosis not present

## 2016-10-27 DIAGNOSIS — Z86711 Personal history of pulmonary embolism: Secondary | ICD-10-CM | POA: Insufficient documentation

## 2016-10-27 DIAGNOSIS — I5032 Chronic diastolic (congestive) heart failure: Secondary | ICD-10-CM | POA: Insufficient documentation

## 2016-10-27 DIAGNOSIS — I4891 Unspecified atrial fibrillation: Secondary | ICD-10-CM | POA: Insufficient documentation

## 2016-10-27 DIAGNOSIS — E119 Type 2 diabetes mellitus without complications: Secondary | ICD-10-CM | POA: Insufficient documentation

## 2016-10-27 DIAGNOSIS — J449 Chronic obstructive pulmonary disease, unspecified: Secondary | ICD-10-CM | POA: Diagnosis not present

## 2016-10-27 DIAGNOSIS — I081 Rheumatic disorders of both mitral and tricuspid valves: Secondary | ICD-10-CM | POA: Insufficient documentation

## 2016-10-27 DIAGNOSIS — I252 Old myocardial infarction: Secondary | ICD-10-CM | POA: Diagnosis not present

## 2016-10-27 DIAGNOSIS — G4733 Obstructive sleep apnea (adult) (pediatric): Secondary | ICD-10-CM | POA: Insufficient documentation

## 2016-10-27 DIAGNOSIS — I371 Nonrheumatic pulmonary valve insufficiency: Secondary | ICD-10-CM | POA: Insufficient documentation

## 2016-10-27 DIAGNOSIS — I493 Ventricular premature depolarization: Secondary | ICD-10-CM

## 2016-10-27 DIAGNOSIS — Z87891 Personal history of nicotine dependence: Secondary | ICD-10-CM | POA: Insufficient documentation

## 2016-10-27 DIAGNOSIS — I2510 Atherosclerotic heart disease of native coronary artery without angina pectoris: Secondary | ICD-10-CM

## 2016-10-27 DIAGNOSIS — E785 Hyperlipidemia, unspecified: Secondary | ICD-10-CM | POA: Insufficient documentation

## 2016-10-27 DIAGNOSIS — I110 Hypertensive heart disease with heart failure: Secondary | ICD-10-CM | POA: Insufficient documentation

## 2016-11-02 NOTE — Progress Notes (Signed)
Chief Complaint  Patient presents with  . Chest Pain    History of Present Illness: 83 yo male with history of PE/DVT,  PVCs, NSVT, GI bleeding, COPD, prostate cancer, OSA, HLD, chronic kidney disease who is here today for cardiac follow up. He was seen as a new patient 03/25/14 for cardiac evaluation. He was in [place] September 2015 and had a syncopal episode. He was taken to a [facility] where he was diagnosed with a non-STEMI. His coumadin was held and he was placed on IV heparin but he developed a GI bleed and no cath was performed. Upper endoscopy showed multiple bleeding gastric ulcers. He also had runs of non-sustained VT and was discharged on metoprolol. Echo 02/08/14 from [facility] with LVEF=40-45%. I arranged a cardiac cath after his first visit but this was cancelled pending GI workup. EGD 04/03/14 with active 40mm ulcer at the duodenal bulb. Otherwise EGD was normal. Severe sigmoid diverticulosis noted on colonoscopy. He is intolerant of ASA (hives). Follow up EGD May 2016 with healed ulcer. He was admitted to [facility] 10/24/14 with dyspnea and found to have recurrent PE. He was started on Eliquis. Echo 10/24/14 with normal LV function, aortic valve sclerosis. He was seen here 11/07/14 and was doing well. He was seen in the the Pulmonary office the following week and his HR was felt to be in the 40s but no EKG was done. I saw him 11/22/14 and EKG showed ventricular bigeminy with overall HR in the 80s. He was started on a beta blocker and he did well. Cardiac monitor 2017 with 72,000 PVCs over 48 hours. He was seen in the EP clinic by Dr. Lovena Le and medical therapy was continued. Echo 10/27/16 with   He is here today for follow up. He has had several episodes of chest pain over the last month. This pain lasts for several minutes, always occurs after meals and when lying flat in bed. He has taken NTG several times with relief of the pain. He has baseline dyspnea due to his COPD. No changes. No  palpitations, lower extremity edema, orthopnea, PND, dizziness, near syncope or syncope.    Primary Care Physician: Merrilee Seashore, MD  Past Medical History:  Diagnosis Date  . Allergic rhinitis   . Allergy   . Asthma   . Atrial fibrillation ([place])   . Bleeding gastric ulcer   . Blood transfusion without reported diagnosis   . Cataract    bilateral removed  . Chronic renal insufficiency, stage III (moderate)   . COPD (chronic obstructive pulmonary disease) ([place])   . Diabetes mellitus without complication ([place])    no meds- boarderline  . Duodenum ulcer    with perforation  . DVT of axillary vein, acute left ([place]) 10/24/2014  . Emphysema   . GERD (gastroesophageal reflux disease)   . H/O hiatal hernia   . High cholesterol   . Hypertension   . Mixed hyperlipidemia   . Myocardial infarction ([place])   . Neuromuscular disorder ([place])    reports shaking, somedays "can't hold spoon" - generalized  shakiness, is going to talk to MD about it at next appt.   . Normal cardiac stress test    reports stress test was several yrs. ago /w [employer]. Dr. Rogelia Mire, WNL   . OSA (obstructive sleep apnea) 2007   CPAP- report of settings on paper chart, seen by [facility] - seen 07/2011   . PE (pulmonary embolism) 10/2011  . Prostate cancer (  [place]) 1992   in remission.  s/p XRT, melanoma under left eye, left temple  . Recurrent upper respiratory infection (URI)    coughing from chronic sinusitis   . Renal stone    some passed spontaneous, & also had cystoscopy  . Shortness of breath on exertion   . Sleep apnea    wears c-pap    Past Surgical History:  Procedure Laterality Date  . CATARACT EXTRACTION W/ INTRAOCULAR LENS  IMPLANT, BILATERAL  ~ 2000  . COLONOSCOPY N/A 04/03/2014   Procedure: COLONOSCOPY;  Surgeon: Inda Castle, MD;  Location: [facility];  Service: Endoscopy;  Laterality: N/A;  . ESOPHAGOGASTRODUODENOSCOPY N/A 08/28/2013   Procedure: ESOPHAGOGASTRODUODENOSCOPY (EGD);   Surgeon: Gayland Curry, MD;  Location: [place];  Service: General;  Laterality: N/A;  . ESOPHAGOGASTRODUODENOSCOPY N/A 04/03/2014   Procedure: ESOPHAGOGASTRODUODENOSCOPY (EGD);  Surgeon: Inda Castle, MD;  Location: [facility];  Service: Endoscopy;  Laterality: N/A;  . GASTROSTOMY N/A 08/28/2013   Procedure: GASTROSTOMY;  Surgeon: Rolm Bookbinder, MD;  Location: [place];  Service: General;  Laterality: N/A;  . LAPAROTOMY N/A 08/28/2013   Procedure: EXPLORATORY LAPAROTOMY;  Surgeon: Rolm Bookbinder, MD;  Location: [place];  Service: General;  Laterality: N/A;  . LOBECTOMY Right 1986   1/3 removed   . LUNG SURGERY  1986   presumed from RLL. Segmentectomy. Benign Nodule.   [name] Kitchen MAXILLARY ANTROSTOMY  09/15/2011   Procedure: MAXILLARY ANTROSTOMY;  Surgeon: Ascencion Dike, MD;  Location: [facility];  Service: ENT;  Laterality: Bilateral;  TOTAL MAXILLARY ANTROSTOMY  . MELANOMA EXCISION Left    under left eye  . REPAIR OF PERFORATED ULCER N/A 08/28/2013   Procedure: DUODENAL ULCER PATCH;  Surgeon: Rolm Bookbinder, MD;  Location: [place];  Service: General;  Laterality: N/A;  . SEPTOPLASTY  09/15/11  . SEPTOPLASTY  09/15/2011   Procedure: SEPTOPLASTY;  Surgeon: Ascencion Dike, MD;  Location: [place];  Service: ENT;  Laterality: Bilateral;  . SINUS ENDO W/FUSION  sch. 09/15/2011   Procedure: ENDOSCOPIC SINUS SURGERY WITH FUSION NAVIGATION;  Surgeon: Ascencion Dike, MD;  Location: [place];  Service: ENT;  Laterality: Bilateral;  bilateral maxillary antrostomy, bilateral endoscopic ethmoidectomy, bilateral sphenoidectomy, bilateral frontal recess exploration  . SINUS ENDO W/FUSION  09/15/2011   Procedure: ENDOSCOPIC SINUS SURGERY WITH FUSION NAVIGATION;  Surgeon: Ascencion Dike, MD;  Location: [place];  Service: ENT;  Laterality: Bilateral;  . SINUS EXPLORATION  09/15/2011   Procedure: SINUS EXPLORATION;  Surgeon: Ascencion Dike, MD;  Location: [place];  Service: ENT;  Laterality: Bilateral;  FRONTAL RECESS EXPLORATION  .  SPHENOIDECTOMY  09/15/2011   Procedure: Coralee Pesa;  Surgeon: Ascencion Dike, MD;  Location: [place];  Service: ENT;  Laterality: Bilateral;  . URETEROSCOPY  1980's    Current Outpatient Prescriptions  Medication Sig Dispense Refill  . Aclidinium Bromide (TUDORZA PRESSAIR) 400 MCG/ACT AEPB Inhale 1 puff into the lungs 2 (two) times daily. 1 each 5  . albuterol (PROVENTIL HFA;VENTOLIN HFA) 108 (90 Base) MCG/ACT inhaler Inhale 2 puffs into the lungs every 6 (six) hours as needed for wheezing or shortness of breath. 1 Inhaler 2  . apixaban (ELIQUIS) 5 MG TABS tablet Take 1 tablet (5 mg total) by mouth 2 (two) times daily. 180 tablet 1  . atorvastatin (LIPITOR) 10 MG tablet Take 10 mg by mouth daily at 6 PM.     . Budesonide 90 MCG/ACT inhaler Inhale 2 puffs into the lungs 2 (two)  times daily. 1 Inhaler 0  . budesonide-formoterol (SYMBICORT) 160-4.5 MCG/ACT inhaler Inhale 2 puffs into the lungs 2 (two) times daily. 1 Inhaler 0  . esomeprazole (NEXIUM) 40 MG capsule Take 1 capsule (40 mg total) by mouth daily. 90 capsule 6  . fluocinonide cream (LIDEX) 7.67 % Apply 1 application topically daily as needed (rash). To infected area.    . fluticasone (FLONASE) 50 MCG/ACT nasal spray Place 2 sprays into the nose at bedtime. Each nostril once a day.    . furosemide (LASIX) 40 MG tablet TAKE 1 TABLET(40 MG) BY MOUTH DAILY 90 tablet 2  . metoprolol tartrate (LOPRESSOR) 25 MG tablet TAKE 1/2 TABLET BY MOUTH TWICE DAILY 30 tablet 5  . nitroGLYCERIN (NITROSTAT) 0.4 MG SL tablet Place 1 tablet (0.4 mg total) under the tongue every 5 (five) minutes as needed for chest pain. 75 tablet 3  . phenol (CHLORASEPTIC) 1.4 % LIQD Use as directed 1 spray in the mouth or throat as needed for throat irritation / pain.    . predniSONE (DELTASONE) 10 MG tablet 40 mg x1 day, then 30 mg x1 day, then 20 mg x1 day, then 10 mg x1 day, and then 5 mg x1 day and stop 11 tablet 0  . tamsulosin (FLOMAX) 0.4 MG CAPS capsule Take 0.4 mg by  mouth daily.     No current facility-administered medications for this visit.     Allergies  Allergen Reactions  . Mometasone Furoate Other (See Comments)    Nasonex: CAUSED RED,ITCHY EYES; "lost my eyelashes"   . Naproxen Sodium Nausea And Vomiting  . Nsaids Other (See Comments)    Stomach ulcers  . Anoro Ellipta [Umeclidinium-Vilanterol] Other (See Comments)    Double vision  . Aspirin Hives and Nausea And Vomiting     Chills, sweats  . Bevespi Aerosphere [Glycopyrrolate-Formoterol] Hives  . Pulmicort [Budesonide] Hives  . Breo Ellipta [Fluticasone Furoate-Vilanterol] Hives  . Dexilant [Dexlansoprazole] Diarrhea    Cramps   . Omnaris [Ciclesonide] Other (See Comments)    Nose bleed   . Spiriva Handihaler [Tiotropium Bromide Monohydrate] Other (See Comments)    Hiccups     Social History   Social History  . Marital status: Married    Spouse name: N/A  . Number of children: 4  . Years of education: N/A   Occupational History  . 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     Social History Main Topics  . Smoking status: Former Smoker    Packs/day: 1.00    Years: 60.00    Types: Cigarettes    Quit date: 08/06/2011  . Smokeless tobacco: Never Used  . Alcohol use 0.0 oz/week     Comment: rarely  . Drug use: No  . Sexual activity: No   Other Topics Concern  . Not on file   Social History Narrative   Traveled to [place].   Married and lives with wife.   Originally from [place].   Spends winters in [place]   Has 2 kids in [place]          Family History  Problem Relation Age of Onset  . Asthma Father   . Heart disease Mother        MI age 31  . Heart attack Mother   . Prostate cancer Brother   . Prostate cancer Brother   . Prostate cancer Brother   . Stroke Sister   . Anesthesia problems Neg Hx   . Colon cancer Neg Hx   . Esophageal cancer  Neg Hx   . Rectal cancer Neg Hx   . Stomach cancer Neg Hx     Review of Systems:  As stated in the HPI and otherwise negative.    BP 100/62   Pulse 74   Ht 5\' 8"  (1.727 m)   Wt 171 lb 12.8 oz (77.9 kg)   SpO2 96%   BMI 26.12 kg/m   Physical Examination:  General: Well developed, well nourished, NAD  HEENT: OP clear, mucus membranes moist  SKIN: warm, dry. No rashes. Neuro: No focal deficits  Musculoskeletal: Muscle strength 5/5 all ext  Psychiatric: Mood and affect normal  Neck: No JVD, no carotid bruits, no thyromegaly, no lymphadenopathy.  Lungs:Clear bilaterally, no wheezes, rhonci, crackles Cardiovascular: Regular rate and rhythm. No murmurs, gallops or rubs. Abdomen:Soft. Bowel sounds present. Non-tender.  Extremities: No lower extremity edema. Pulses are 2 + in the bilateral DP/PT.  Echo May 2018:  Left ventricle: Septal and inferior basal hypokinesis. The cavity   size was normal. Systolic function was mildly reduced. The   estimated ejection fraction was in the range of 45% to 50%. Wall   motion was normal; there were no regional wall motion   abnormalities. Doppler parameters are consistent with abnormal   left ventricular relaxation (grade 1 diastolic dysfunction). - Atrial septum: No defect or patent foramen ovale was identified.   EKG:  EKG is ordered today The ekg ordered today demonstrates Sinus, rate 62 bpm. PVC  Recent Labs: No results found for requested labs within last 8760 hours.   Lipid Panel    Component Value Date/Time   TRIG 56 08/28/2013 1620     Wt Readings from Last 3 Encounters:  11/03/16 171 lb 12.8 oz (77.9 kg)  09/27/16 169 lb 14.4 oz (77.1 kg)  09/01/16 170 lb (77.1 kg)     Other studies Reviewed: Additional studies/ records that were reviewed today include:  Review of the above records demonstrates:    Assessment and Plan:   1. CAD without angina: He has presumed CAD but has had no cardiac cath due to issues with GI bleeding on heparin (due to gastric ulcers) and then PE requiring Eliquis. He is having chest pains while lying in bed, always at rest.  He has been exercising by walking on the treadmill with no chest pain during exercise. I have discussed a possible cardiac cath. Since his pain is atypical and EKG is normal, will plan to see him back in several weeks and re-evaluate. May plan cath at that time. Continue statin and beta blocker. BP too low for Imdur.  No ASA given prior gastric ulcers and current use of Eliquis.   2. HLD: Continue statin  3. DVT/PE: Followed in Pulmonary/primary care. He is on Eliquis.   4. PVCs with bigeminy/trigeminy: Continue Lopressor.   5. Bilateral lower extremity edema: Felt to be due to chronic venous insufficiency. Continue Lasix.    6. HTN: BP is controlled. No changes  Current medicines are reviewed at length with the patient today.  The patient does not have concerns regarding medicines.  The following changes have been made:  no change  Labs/ tests ordered today include:   Orders Placed This Encounter  Procedures  . EKG 12-Lead    Disposition:   FU with me in 6 months  Signed, Lauree Chandler, MD 11/03/2016 12:11 PM    [facility] [address], [place], [place]  [address] Phone: [phone]; Fax: [phone]

## 2016-11-03 ENCOUNTER — Ambulatory Visit (INDEPENDENT_AMBULATORY_CARE_PROVIDER_SITE_OTHER): Payer: Medicare Other | Admitting: Cardiovascular Disease

## 2016-11-03 ENCOUNTER — Encounter: Payer: Self-pay | Admitting: Cardiovascular Disease

## 2016-11-03 VITALS — BP 100/62 | HR 74 | Ht 68.0 in | Wt 171.8 lb

## 2016-11-03 DIAGNOSIS — I493 Ventricular premature depolarization: Secondary | ICD-10-CM | POA: Diagnosis not present

## 2016-11-03 DIAGNOSIS — E7800 Pure hypercholesterolemia, unspecified: Secondary | ICD-10-CM

## 2016-11-03 DIAGNOSIS — I2510 Atherosclerotic heart disease of native coronary artery without angina pectoris: Secondary | ICD-10-CM | POA: Diagnosis not present

## 2016-11-03 DIAGNOSIS — I25118 Atherosclerotic heart disease of native coronary artery with other forms of angina pectoris: Secondary | ICD-10-CM

## 2016-11-03 DIAGNOSIS — I209 Angina pectoris, unspecified: Secondary | ICD-10-CM

## 2016-11-03 NOTE — Patient Instructions (Addendum)
Medication Instructions:  Your physician recommends that you continue on your current medications as directed. Please refer to the Current Medication list given to you today.   Labwork: none  Testing/Procedures: none  Follow-Up: Your physician recommends that you schedule a follow-up appointment in: a couple weeks--Scheduled for June 18,2018 at 10:00    Any Other Special Instructions Will Be Listed Below (If Applicable).     If you need a refill on your cardiac medications before your next appointment, please call your pharmacy.

## 2016-11-15 ENCOUNTER — Encounter: Payer: Self-pay | Admitting: Cardiovascular Disease

## 2016-11-15 ENCOUNTER — Ambulatory Visit (INDEPENDENT_AMBULATORY_CARE_PROVIDER_SITE_OTHER): Payer: Medicare Other | Admitting: Cardiovascular Disease

## 2016-11-15 VITALS — BP 86/50 | HR 62 | Ht 68.0 in | Wt 168.0 lb

## 2016-11-15 DIAGNOSIS — I493 Ventricular premature depolarization: Secondary | ICD-10-CM | POA: Diagnosis not present

## 2016-11-15 DIAGNOSIS — E7800 Pure hypercholesterolemia, unspecified: Secondary | ICD-10-CM | POA: Diagnosis not present

## 2016-11-15 DIAGNOSIS — I2510 Atherosclerotic heart disease of native coronary artery without angina pectoris: Secondary | ICD-10-CM | POA: Diagnosis not present

## 2016-11-15 MED ORDER — FUROSEMIDE 40 MG PO TABS: ORAL_TABLET | ORAL | 2 refills | Status: DC

## 2016-11-15 MED ORDER — METOPROLOL TARTRATE 25 MG PO TABS: 12.5000 mg | ORAL_TABLET | Freq: Two times a day (BID) | ORAL | 2 refills | Status: DC

## 2016-11-15 NOTE — Progress Notes (Signed)
Chief Complaint  Patient presents with  . Follow-up    History of Present Illness: 83 yo male with history of PE/DVT,  PVCs, NSVT, GI bleeding, COPD, prostate cancer, OSA, HLD, chronic kidney disease who is here today for cardiac follow up. He was seen as a new patient 03/25/14 for cardiac evaluation after he arrived back in [place] from a trip to [place]. He was in [place] September 2015 and had a syncopal episode. He was taken to a [facility] where he was diagnosed with a non-STEMI. His coumadin was held and he was placed on IV heparin but he developed a GI bleed and no cath was performed. Upper endoscopy showed multiple bleeding gastric ulcers. He also had runs of non-sustained VT and was discharged on metoprolol. Echo 02/08/14 from [facility] with LVEF=40-45%. I arranged a cardiac cath after his first visit but this was cancelled pending GI workup. EGD 04/03/14 with active bleeding ulcer in the duodenal bulb. Otherwise EGD was normal. Severe sigmoid diverticulosis noted on colonoscopy. He is intolerant of ASA (hives). Follow up EGD May 2016 with healed ulcer. He was admitted to [facility] 10/24/14 with dyspnea and found to have recurrent PE. He was started on Eliquis. Echo 10/24/14 with normal LV function, aortic valve sclerosis. He was seen here 11/07/14 and was doing well. He was seen in the the Pulmonary office the following week and his HR was felt to be in the 40s but no EKG was done. I saw him 11/22/14 and EKG showed ventricular bigeminy with overall HR in the 80s. He was started on a beta blocker and he did well. Cardiac monitor 2017 with 72,000 PVCs over 48 hours. He was seen in the EP clinic by Dr. Lovena Le and medical therapy was continued. Echo 10/27/16 with LVEF=45-50%, no valve disease.   He is here today for follow up. The patient denies any palpitations, lower extremity edema, orthopnea, PND, dizziness, near syncope or syncope. Chest pain has resolved. He thinks this was due to hiccups. He is  having no exertional chest pain. Overall feeling well. He has baseline dyspnea.    Primary Care Physician: Merrilee Seashore, MD  Past Medical History:  Diagnosis Date  . Allergic rhinitis   . Allergy   . Asthma   . Atrial fibrillation ([place])   . Bleeding gastric ulcer   . Blood transfusion without reported diagnosis   . Cataract    bilateral removed  . Chronic renal insufficiency, stage III (moderate)   . COPD (chronic obstructive pulmonary disease) ([place])   . Diabetes mellitus without complication ([place])    no meds- boarderline  . Duodenum ulcer    with perforation  . DVT of axillary vein, acute left ([place]) 10/24/2014  . Emphysema   . GERD (gastroesophageal reflux disease)   . H/O hiatal hernia   . High cholesterol   . Hypertension   . Mixed hyperlipidemia   . Myocardial infarction ([place])   . Neuromuscular disorder ([place])    reports shaking, somedays "can't hold spoon" - generalized  shakiness, is going to talk to MD about it at next appt.   . Normal cardiac stress test    reports stress test was several yrs. ago /w [employer]. Dr. Rogelia Mire, WNL   . OSA (obstructive sleep apnea) 2007   CPAP- report of settings on paper chart, seen by [facility] - seen 07/2011   . PE (pulmonary embolism) 10/2011  . Prostate cancer ([place]) 1992   in remission.  s/p XRT, melanoma under left eye, left temple  . Recurrent upper respiratory infection (URI)    coughing from chronic sinusitis   . Renal stone    some passed spontaneous, & also had cystoscopy  . Shortness of breath on exertion   . Sleep apnea    wears c-pap    Past Surgical History:  Procedure Laterality Date  . CATARACT EXTRACTION W/ INTRAOCULAR LENS  IMPLANT, BILATERAL  ~ 2000  . COLONOSCOPY N/A 04/03/2014   Procedure: COLONOSCOPY;  Surgeon: Inda Castle, MD;  Location: [facility];  Service: Endoscopy;  Laterality: N/A;  . ESOPHAGOGASTRODUODENOSCOPY N/A 08/28/2013   Procedure: ESOPHAGOGASTRODUODENOSCOPY (EGD);  Surgeon: Gayland Curry, MD;  Location: [facility];  Service: General;  Laterality: N/A;  . ESOPHAGOGASTRODUODENOSCOPY N/A 04/03/2014   Procedure: ESOPHAGOGASTRODUODENOSCOPY (EGD);  Surgeon: Inda Castle, MD;  Location: [facility];  Service: Endoscopy;  Laterality: N/A;  . GASTROSTOMY N/A 08/28/2013   Procedure: GASTROSTOMY;  Surgeon: Rolm Bookbinder, MD;  Location: [facility];  Service: General;  Laterality: N/A;  . LAPAROTOMY N/A 08/28/2013   Procedure: EXPLORATORY LAPAROTOMY;  Surgeon: Rolm Bookbinder, MD;  Location: [facility];  Service: General;  Laterality: N/A;  . LOBECTOMY Right 1986   1/3 removed   . LUNG SURGERY  1986   presumed from RLL. Segmentectomy. Benign Nodule.   [name] Kitchen MAXILLARY ANTROSTOMY  09/15/2011   Procedure: MAXILLARY ANTROSTOMY;  Surgeon: Ascencion Dike, MD;  Location: [facility] OR;  Service: ENT;  Laterality: Bilateral;  TOTAL MAXILLARY ANTROSTOMY  . MELANOMA EXCISION Left    under left eye  . REPAIR OF PERFORATED ULCER N/A 08/28/2013   Procedure: DUODENAL ULCER PATCH;  Surgeon: Rolm Bookbinder, MD;  Location: [facility];  Service: General;  Laterality: N/A;  . SEPTOPLASTY  09/15/11  . SEPTOPLASTY  09/15/2011   Procedure: SEPTOPLASTY;  Surgeon: Ascencion Dike, MD;  Location: [facility];  Service: ENT;  Laterality: Bilateral;  . SINUS ENDO W/FUSION  sch. 09/15/2011   Procedure: ENDOSCOPIC SINUS SURGERY WITH FUSION NAVIGATION;  Surgeon: Ascencion Dike, MD;  Location: [facility];  Service: ENT;  Laterality: Bilateral;  bilateral maxillary antrostomy, bilateral endoscopic ethmoidectomy, bilateral sphenoidectomy, bilateral frontal recess exploration  . SINUS ENDO W/FUSION  09/15/2011   Procedure: ENDOSCOPIC SINUS SURGERY WITH FUSION NAVIGATION;  Surgeon: Ascencion Dike, MD;  Location: [facility];  Service: ENT;  Laterality: Bilateral;  . SINUS EXPLORATION  09/15/2011   Procedure: SINUS EXPLORATION;  Surgeon: Ascencion Dike, MD;  Location: [facility];  Service: ENT;  Laterality: Bilateral;  FRONTAL RECESS EXPLORATION  . SPHENOIDECTOMY   09/15/2011   Procedure: Cor[place]e P[place];  Surgeon: Ascencion Dike, MD;  Location: [facility];  Service: ENT;  Laterality: Bilateral;  . URETEROSCOPY  1980's    Current Outpatient Prescriptions  Medication Sig Dispense Refill  . Aclidinium Bromide (TUDORZA PRESSAIR) 400 MCG/ACT AEPB Inhale 1 puff into the lungs 2 (two) times daily. 1 each 5  . albuterol (PROVENTIL HFA;VENTOLIN HFA) 108 (90 Base) MCG/ACT inhaler Inhale 2 puffs into the lungs every 6 (six) hours as needed for wheezing or shortness of breath. 1 Inhaler 2  . apixaban (ELIQUIS) 5 MG TABS tablet Take 1 tablet (5 mg total) by mouth 2 (two) times daily. 180 tablet 1  . atorvastatin (LIPITOR) 10 MG tablet Take 10 mg by mouth daily at 6 PM.     . budesonide-formoterol (SYMBICORT) 160-4.5 MCG/ACT inhaler Inhale 2 puffs into the lungs 2 (two) times daily. 1 Inhaler 0  .  esomeprazole (NEXIUM) 40 MG capsule Take 1 capsule (40 mg total) by mouth daily. 90 capsule 6  . fluocinonide cream (LIDEX) 2.59 % Apply 1 application topically daily as needed (rash). To infected area.    . fluticasone (FLONASE) 50 MCG/ACT nasal spray Place 2 sprays into the nose at bedtime. Each nostril once a day.    . furosemide (LASIX) 40 MG tablet TAKE 1 TABLET(40 MG) BY MOUTH DAILY 90 tablet 2  . metoprolol tartrate (LOPRESSOR) 25 MG tablet Take 0.5 tablets (12.5 mg total) by mouth 2 (two) times daily. 90 tablet 2  . nitroGLYCERIN (NITROSTAT) 0.4 MG SL tablet Place 1 tablet (0.4 mg total) under the tongue every 5 (five) minutes as needed for chest pain. 75 tablet 3  . tamsulosin (FLOMAX) 0.4 MG CAPS capsule Take 0.4 mg by mouth daily.     No current facility-administered medications for this visit.     Allergies  Allergen Reactions  . Mometasone Furoate Other (See Comments)    Nasonex: CAUSED RED,ITCHY EYES; "lost my eyelashes"   . Naproxen Sodium Nausea And Vomiting  . Nsaids Other (See Comments)    Stomach ulcers  . Anoro Ellipta [Umeclidinium-Vilanterol] Other (See  Comments)    Double vision  . Aspirin Hives and Nausea And Vomiting     Chills, sweats  . Bevespi Aerosphere [Glycopyrrolate-Formoterol] Hives  . Pulmicort [Budesonide] Hives  . Breo Ellipta [Fluticasone Furoate-Vilanterol] Hives  . Dexilant [Dexlansoprazole] Diarrhea    Cramps   . Omnaris [Ciclesonide] Other (See Comments)    Nose bleed   . Spiriva Handihaler [Tiotropium Bromide Monohydrate] Other (See Comments)    Hiccups     Social History   Social History  . Marital status: Married    Spouse name: N/A  . Number of children: 4  . Years of education: N/A   Occupational History  . 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     Social History Main Topics  . Smoking status: Former Smoker    Packs/day: 1.00    Years: 60.00    Types: Cigarettes    Quit date: 08/06/2011  . Smokeless tobacco: Never Used  . Alcohol use 0.0 oz/week     Comment: rarely  . Drug use: No  . Sexual activity: No   Other Topics Concern  . Not on file   Social History Narrative   Traveled to [place].   Married and lives with wife.   Originally from [place].   Spends winters in [place]   Has 2 kids in [place]          Family History  Problem Relation Age of Onset  . Asthma Father   . Heart disease Mother        MI age 71  . Heart attack Mother   . Prostate cancer Brother   . Prostate cancer Brother   . Prostate cancer Brother   . Stroke Sister   . Anesthesia problems Neg Hx   . Colon cancer Neg Hx   . Esophageal cancer Neg Hx   . Rectal cancer Neg Hx   . Stomach cancer Neg Hx     Review of Systems:  As stated in the HPI and otherwise negative.   BP (!) 86/50   Pulse 62   Ht 5\' 8"  (1.727 m)   Wt 168 lb (76.2 kg)   SpO2 96%   BMI 25.54 kg/m   Physical Examination: General: Well developed, well nourished, NAD  HEENT: OP clear, mucus membranes moist  SKIN: warm, dry.  No rashes. Neuro: No focal deficits  Musculoskeletal: Muscle strength 5/5 all ext  Psychiatric: Mood and affect normal  Neck: No  JVD, no carotid bruits, no thyromegaly, no lymphadenopathy.  Lungs:Clear bilaterally, no wheezes, rhonci, crackles Cardiovascular: Regular rate and rhythm. No murmurs, gallops or rubs. Abdomen:Soft. Bowel sounds present. Non-tender.  Extremities: 1+ bilateral lower extremity edema. Pulses are 2 + in the bilateral DP/PT.  Echo May 2018:  Left ventricle: Septal and inferior basal hypokinesis. The cavity   size was normal. Systolic function was mildly reduced. The   estimated ejection fraction was in the range of 45% to 50%. Wall   motion was normal; there were no regional wall motion   abnormalities. Doppler parameters are consistent with abnormal   left ventricular relaxation (grade 1 diastolic dysfunction). - Atrial septum: No defect or patent foramen ovale was identified.  EKG:  EKG is not ordered today The ekg ordered today demonstrates   Recent Labs: No results found for requested labs within last 8760 hours.   Lipid Panel    Component Value Date/Time   TRIG 56 08/28/2013 1620     Wt Readings from Last 3 Encounters:  11/15/16 168 lb (76.2 kg)  11/03/16 171 lb 12.8 oz (77.9 kg)  09/27/16 169 lb 14.4 oz (77.1 kg)     Other studies Reviewed: Additional studies/ records that were reviewed today include:  Review of the above records demonstrates:    Assessment and Plan:   1. CAD without angina: He has presumed CAD but has had no cath due to issues with prior GI bleeding while on heparin and then DVT/PE requiring Eliquis. His resting chest pain has resolved. He thinks it was due to hiccups as it was occurring at night while lying in bed. No exertional chest pain. Will not plan cardiac cath at this time. Will continue statin and beta blocker. No ASA given current use of Eliquis.    2. HLD: Will continue statin. Lipids followed in primary care.   3. DVT/PE: Followed in Pulmonary/primary care. Continue Eliquis  4. PVCs with bigeminy/trigeminy: No awareness of palpitations.  Will continue Lopressor.   5. Bilateral lower extremity edema: Likely due to chronic venous insufficiency. Continue Lasix. He will use extra Lasix as needed.   6. HTN: BP is controlled and actually low today. He is having no dizziness. No changes.   Current medicines are reviewed at length with the patient today.  The patient does not have concerns regarding medicines.  The following changes have been made:  no change  Labs/ tests ordered today include:   No orders of the defined types were placed in this encounter.   Disposition:   FU with me in 6 months  Signed, Lauree Chandler, MD 11/15/2016 10:57 AM    [facility] [address], [place], [place]  [address] Phone: [phone]; Fax: [phone]

## 2016-11-15 NOTE — Patient Instructions (Signed)

## 2016-11-16 ENCOUNTER — Telehealth: Payer: Self-pay | Admitting: Internal Medicine

## 2016-11-16 MED ORDER — BUDESONIDE-FORMOTEROL FUMARATE 160-4.5 MCG/ACT IN AERO: 2.0000 | INHALATION_SPRAY | Freq: Two times a day (BID) | RESPIRATORY_TRACT | 0 refills | Status: DC

## 2016-11-16 NOTE — Telephone Encounter (Signed)
Spoke with the pt  I advised will leave 1 sample of symbicort for pick up  We do not have any eliquis right now  Nothing further needed

## 2016-12-07 ENCOUNTER — Other Ambulatory Visit: Payer: Self-pay | Admitting: Internal Medicine

## 2017-01-10 DIAGNOSIS — H353121 Nonexudative age-related macular degeneration, left eye, early dry stage: Secondary | ICD-10-CM | POA: Diagnosis not present

## 2017-01-10 DIAGNOSIS — H04123 Dry eye syndrome of bilateral lacrimal glands: Secondary | ICD-10-CM | POA: Diagnosis not present

## 2017-01-10 DIAGNOSIS — H26492 Other secondary cataract, left eye: Secondary | ICD-10-CM | POA: Diagnosis not present

## 2017-01-11 DIAGNOSIS — K254 Chronic or unspecified gastric ulcer with hemorrhage: Secondary | ICD-10-CM | POA: Diagnosis not present

## 2017-01-11 DIAGNOSIS — I2699 Other pulmonary embolism without acute cor pulmonale: Secondary | ICD-10-CM | POA: Diagnosis not present

## 2017-01-11 DIAGNOSIS — I82409 Acute embolism and thrombosis of unspecified deep veins of unspecified lower extremity: Secondary | ICD-10-CM | POA: Diagnosis not present

## 2017-01-11 DIAGNOSIS — E119 Type 2 diabetes mellitus without complications: Secondary | ICD-10-CM | POA: Diagnosis not present

## 2017-01-11 DIAGNOSIS — T39015A Adverse effect of aspirin, initial encounter: Secondary | ICD-10-CM | POA: Diagnosis not present

## 2017-01-11 DIAGNOSIS — G4733 Obstructive sleep apnea (adult) (pediatric): Secondary | ICD-10-CM | POA: Diagnosis not present

## 2017-01-11 DIAGNOSIS — J45909 Unspecified asthma, uncomplicated: Secondary | ICD-10-CM | POA: Diagnosis not present

## 2017-01-11 DIAGNOSIS — J449 Chronic obstructive pulmonary disease, unspecified: Secondary | ICD-10-CM | POA: Diagnosis not present

## 2017-01-11 DIAGNOSIS — E789 Disorder of lipoprotein metabolism, unspecified: Secondary | ICD-10-CM | POA: Diagnosis not present

## 2017-01-11 DIAGNOSIS — K219 Gastro-esophageal reflux disease without esophagitis: Secondary | ICD-10-CM | POA: Diagnosis not present

## 2017-01-11 DIAGNOSIS — N183 Chronic kidney disease, stage 3 (moderate): Secondary | ICD-10-CM | POA: Diagnosis not present

## 2017-01-11 DIAGNOSIS — R0600 Dyspnea, unspecified: Secondary | ICD-10-CM | POA: Diagnosis not present

## 2017-01-13 DIAGNOSIS — K449 Diaphragmatic hernia without obstruction or gangrene: Secondary | ICD-10-CM | POA: Diagnosis not present

## 2017-01-13 DIAGNOSIS — J439 Emphysema, unspecified: Secondary | ICD-10-CM | POA: Diagnosis not present

## 2017-01-26 DIAGNOSIS — R937 Abnormal findings on diagnostic imaging of other parts of musculoskeletal system: Secondary | ICD-10-CM | POA: Diagnosis not present

## 2017-02-02 ENCOUNTER — Other Ambulatory Visit: Payer: Self-pay | Admitting: Internal Medicine

## 2017-02-02 DIAGNOSIS — R222 Localized swelling, mass and lump, trunk: Secondary | ICD-10-CM | POA: Insufficient documentation

## 2017-02-02 DIAGNOSIS — Z23 Encounter for immunization: Secondary | ICD-10-CM | POA: Diagnosis not present

## 2017-02-06 DIAGNOSIS — G4733 Obstructive sleep apnea (adult) (pediatric): Secondary | ICD-10-CM | POA: Diagnosis not present

## 2017-02-07 ENCOUNTER — Telehealth: Payer: Self-pay | Admitting: Internal Medicine

## 2017-02-07 DIAGNOSIS — G4733 Obstructive sleep apnea (adult) (pediatric): Secondary | ICD-10-CM | POA: Diagnosis not present

## 2017-02-07 NOTE — Telephone Encounter (Signed)
Called and spoke with pt. Pt states he will be having a needle biopsy on 02/15/17. Pt states he was instructed to hold Eliquis for 2 days prior.  Pt would like to know if MR agrees with recommendations.   MR please advise. Thanks.

## 2017-02-09 NOTE — Telephone Encounter (Signed)
MR please advise. Thanks! 

## 2017-02-09 NOTE — Telephone Encounter (Signed)
Pt is aware to stop Eliquis for 48h prior. Nothing further needed.

## 2017-02-09 NOTE — Telephone Encounter (Signed)
Yes stop eliquis for 48h  Best wishes

## 2017-02-09 NOTE — Telephone Encounter (Signed)
Pt states biopsy will on his spine. Pt states he had a CT that showed a lesion that will be bx.  Will route to MR to make aware.

## 2017-02-09 NOTE — Telephone Encounter (Signed)
   What needle biopsy of which part of body?   Yes hold eliquis for 2 days.    Dr. Brand Males, M.D., [facility].C.P Pulmonary and Critical Care Medicine Staff Physician [facility] Pager: [phone], If no answer or between  15:00h - 7:00h: call [phone]  02/09/2017 4:20 PM

## 2017-02-14 ENCOUNTER — Telehealth: Payer: Self-pay | Admitting: Cardiovascular Disease

## 2017-02-14 NOTE — Telephone Encounter (Signed)
Thanks

## 2017-02-14 NOTE — Telephone Encounter (Signed)
New message    Pt is calling asking for a call back. He did not say what it was in regards to.

## 2017-02-14 NOTE — Telephone Encounter (Signed)
I spoke with pt and his wife. They are calling to let us know pt is scheduled for biopsy on his spine tomorrow.  Pt is in [place], [place].  I told pt I would make Dr. Angelena Form aware.

## 2017-02-15 DIAGNOSIS — Z87891 Personal history of nicotine dependence: Secondary | ICD-10-CM | POA: Diagnosis not present

## 2017-02-15 DIAGNOSIS — M898X8 Other specified disorders of bone, other site: Secondary | ICD-10-CM | POA: Diagnosis not present

## 2017-02-15 DIAGNOSIS — M4624 Osteomyelitis of vertebra, thoracic region: Secondary | ICD-10-CM | POA: Diagnosis not present

## 2017-02-15 DIAGNOSIS — Z8546 Personal history of malignant neoplasm of prostate: Secondary | ICD-10-CM | POA: Diagnosis not present

## 2017-02-17 DIAGNOSIS — R799 Abnormal finding of blood chemistry, unspecified: Secondary | ICD-10-CM | POA: Diagnosis not present

## 2017-02-17 DIAGNOSIS — G4761 Periodic limb movement disorder: Secondary | ICD-10-CM | POA: Diagnosis not present

## 2017-02-18 DIAGNOSIS — R222 Localized swelling, mass and lump, trunk: Secondary | ICD-10-CM | POA: Diagnosis not present

## 2017-03-08 DIAGNOSIS — G959 Disease of spinal cord, unspecified: Secondary | ICD-10-CM | POA: Diagnosis not present

## 2017-03-29 DIAGNOSIS — E782 Mixed hyperlipidemia: Secondary | ICD-10-CM | POA: Diagnosis not present

## 2017-03-29 DIAGNOSIS — D509 Iron deficiency anemia, unspecified: Secondary | ICD-10-CM | POA: Diagnosis not present

## 2017-03-29 DIAGNOSIS — G250 Essential tremor: Secondary | ICD-10-CM | POA: Diagnosis not present

## 2017-03-29 DIAGNOSIS — E039 Hypothyroidism, unspecified: Secondary | ICD-10-CM | POA: Diagnosis not present

## 2017-03-29 DIAGNOSIS — R7301 Impaired fasting glucose: Secondary | ICD-10-CM | POA: Diagnosis not present

## 2017-04-05 DIAGNOSIS — N183 Chronic kidney disease, stage 3 (moderate): Secondary | ICD-10-CM | POA: Diagnosis not present

## 2017-04-05 DIAGNOSIS — E782 Mixed hyperlipidemia: Secondary | ICD-10-CM | POA: Diagnosis not present

## 2017-04-05 DIAGNOSIS — M899 Disorder of bone, unspecified: Secondary | ICD-10-CM | POA: Diagnosis not present

## 2017-04-05 DIAGNOSIS — E538 Deficiency of other specified B group vitamins: Secondary | ICD-10-CM | POA: Diagnosis not present

## 2017-04-05 DIAGNOSIS — I2510 Atherosclerotic heart disease of native coronary artery without angina pectoris: Secondary | ICD-10-CM | POA: Diagnosis not present

## 2017-04-08 DIAGNOSIS — C61 Malignant neoplasm of prostate: Secondary | ICD-10-CM | POA: Diagnosis not present

## 2017-04-11 ENCOUNTER — Encounter: Payer: Self-pay | Admitting: Pulmonary Disease

## 2017-04-11 ENCOUNTER — Ambulatory Visit (INDEPENDENT_AMBULATORY_CARE_PROVIDER_SITE_OTHER): Payer: Medicare Other | Admitting: Pulmonary Disease

## 2017-04-11 DIAGNOSIS — J449 Chronic obstructive pulmonary disease, unspecified: Secondary | ICD-10-CM | POA: Diagnosis not present

## 2017-04-11 DIAGNOSIS — I2510 Atherosclerotic heart disease of native coronary artery without angina pectoris: Secondary | ICD-10-CM | POA: Diagnosis not present

## 2017-04-11 DIAGNOSIS — G4733 Obstructive sleep apnea (adult) (pediatric): Secondary | ICD-10-CM

## 2017-04-11 MED ORDER — BUDESONIDE-FORMOTEROL FUMARATE 160-4.5 MCG/ACT IN AERO: 2.0000 | INHALATION_SPRAY | Freq: Two times a day (BID) | RESPIRATORY_TRACT | 0 refills | Status: DC

## 2017-04-11 NOTE — Patient Instructions (Addendum)
Please bring in  Chip from machine so that we can review download. CPAP is set at 13 cm

## 2017-04-11 NOTE — Assessment & Plan Note (Signed)
He will continue on Symbicort. I asked him to try a sample of incruse that he has and call us back for prescription if this works

## 2017-04-11 NOTE — Assessment & Plan Note (Signed)
CPAP is set at 13 cm round lesion his last sleep study.  Review download to confirm that the settings are working well  Weight loss encouraged, compliance with goal of at least 4-6 hrs every night is the expectation. Advised against medications with sedative side effects Cautioned against driving when sleepy - understanding that sleepiness will vary on a day to day basis

## 2017-04-11 NOTE — Progress Notes (Signed)
   Subjective:    Patient ID: Jon White, male    DOB: May 14, 1933, 83 y.o.   MRN: [id number]  HPI  83 year old for follow-up of moderate COPD & OSA Maintained on Symbicort and Incruse- he has various allergies to Spiriva and Breo  He has a history of right lower lobectomy in the remote past for benign lesion  His daughter works at the sleep lab in [place], he went up there and had a sleep study done. I reviewed the studies.  Based on this CPAP was adjusted to 13 cm with a full facemask He is sleeping well, wife is not noted snoring, he wakes up feeling rested. His weight is unchanged. He denies any mask issues or dryness  From a COPD standpoint, he has not had an exacerbation in the last 6 months.  Compliant with Symbicort.  He has allergies to multiple inhalers, and given a sample of incruse but has not tried this yet.  [name] was too expensive  Significant tests/ events  PFTs 2014 FEV1 42%-1.08, significant bronchodilator response with improvement to 59%  NPSG 2008:  AHI 11/hr, but no supine sleep.  Titration study:  cpap 8cm.  Auto 06/2011:  Optimal pressure 12cm.  PSG 01/2017 RDI 12/hours-, predominant events during REM sleep with lowest desaturation 80% corrected by CPAP 13 cm,    Review of Systems neg for any significant sore throat, dysphagia, itching, sneezing, nasal congestion or excess/ purulent secretions, fever, chills, sweats, unintended wt loss, pleuritic or exertional cp, hempoptysis, orthopnea pnd or change in chronic leg swelling.   Also denies presyncope, palpitations, heartburn, abdominal pain, nausea, vomiting, diarrhea or change in bowel or urinary habits, dysuria,hematuria, rash, arthralgias, visual complaints, headache, numbness weakness or ataxia.     Objective:   Physical Exam  Gen. Pleasant, well-nourished, in no distress ENT - no thrush, no post nasal drip Neck: No JVD, no thyromegaly, no carotid bruits Lungs: no use of accessory muscles,  no dullness to percussion, decreased without rales or rhonchi  Cardiovascular: Rhythm regular, heart sounds  normal, no murmurs or gallops, no peripheral edema Musculoskeletal: No deformities, no cyanosis or clubbing        Assessment & Plan:

## 2017-04-13 DIAGNOSIS — M869 Osteomyelitis, unspecified: Secondary | ICD-10-CM | POA: Diagnosis not present

## 2017-04-13 DIAGNOSIS — J330 Polyp of nasal cavity: Secondary | ICD-10-CM | POA: Diagnosis not present

## 2017-04-13 DIAGNOSIS — J310 Chronic rhinitis: Secondary | ICD-10-CM | POA: Diagnosis not present

## 2017-04-14 ENCOUNTER — Other Ambulatory Visit: Payer: Self-pay | Admitting: Neurosurgery

## 2017-04-14 DIAGNOSIS — M869 Osteomyelitis, unspecified: Secondary | ICD-10-CM

## 2017-04-15 ENCOUNTER — Telehealth: Payer: Self-pay | Admitting: Internal Medicine

## 2017-04-15 DIAGNOSIS — N200 Calculus of kidney: Secondary | ICD-10-CM | POA: Diagnosis not present

## 2017-04-15 DIAGNOSIS — C61 Malignant neoplasm of prostate: Secondary | ICD-10-CM | POA: Diagnosis not present

## 2017-04-15 DIAGNOSIS — R350 Frequency of micturition: Secondary | ICD-10-CM | POA: Diagnosis not present

## 2017-04-15 NOTE — Telephone Encounter (Signed)
DL and been printed and placed in RA's cubby for review.  Pt has been given copy of DL as well per request.   Will route to Cherina to f/u on.

## 2017-04-18 ENCOUNTER — Ambulatory Visit
Admission: RE | Admit: 2017-04-18 | Discharge: 2017-04-18 | Disposition: A | Discharge status: Home / Self Care | Payer: Medicare Other | Source: Ambulatory Visit | Attending: Neurosurgery | Admitting: Neurosurgery

## 2017-04-18 DIAGNOSIS — M869 Osteomyelitis, unspecified: Secondary | ICD-10-CM

## 2017-04-18 DIAGNOSIS — M47814 Spondylosis without myelopathy or radiculopathy, thoracic region: Secondary | ICD-10-CM | POA: Diagnosis not present

## 2017-04-19 NOTE — Telephone Encounter (Signed)
Reviewed - 13cm Good control of events, no leak, excellent compliance CPAP is working well

## 2017-04-22 NOTE — Telephone Encounter (Signed)
Called and spoke with pts wife and she is aware that pts cpap is working well for him.  Nothing further is needed.

## 2017-04-28 DIAGNOSIS — M869 Osteomyelitis, unspecified: Secondary | ICD-10-CM | POA: Diagnosis not present

## 2017-04-28 DIAGNOSIS — Z6825 Body mass index (BMI) 25.0-25.9, adult: Secondary | ICD-10-CM | POA: Diagnosis not present

## 2017-04-28 DIAGNOSIS — I10 Essential (primary) hypertension: Secondary | ICD-10-CM | POA: Diagnosis not present

## 2017-04-30 ENCOUNTER — Other Ambulatory Visit: Payer: Self-pay | Admitting: Neurosurgery

## 2017-04-30 DIAGNOSIS — M869 Osteomyelitis, unspecified: Secondary | ICD-10-CM

## 2017-05-13 DIAGNOSIS — R21 Rash and other nonspecific skin eruption: Secondary | ICD-10-CM | POA: Diagnosis not present

## 2017-05-13 DIAGNOSIS — J3089 Other allergic rhinitis: Secondary | ICD-10-CM | POA: Diagnosis not present

## 2017-05-13 DIAGNOSIS — J4530 Mild persistent asthma, uncomplicated: Secondary | ICD-10-CM | POA: Diagnosis not present

## 2017-05-13 DIAGNOSIS — J339 Nasal polyp, unspecified: Secondary | ICD-10-CM | POA: Diagnosis not present

## 2017-05-16 DIAGNOSIS — I2510 Atherosclerotic heart disease of native coronary artery without angina pectoris: Secondary | ICD-10-CM | POA: Diagnosis not present

## 2017-05-16 DIAGNOSIS — M899 Disorder of bone, unspecified: Secondary | ICD-10-CM | POA: Diagnosis not present

## 2017-05-16 DIAGNOSIS — E782 Mixed hyperlipidemia: Secondary | ICD-10-CM | POA: Diagnosis not present

## 2017-06-08 ENCOUNTER — Telehealth: Payer: Self-pay | Admitting: Pulmonary Disease

## 2017-06-08 MED ORDER — UMECLIDINIUM BROMIDE 62.5 MCG/INH IN AEPB: 1.0000 | INHALATION_SPRAY | Freq: Every day | RESPIRATORY_TRACT | 5 refills | Status: DC

## 2017-06-08 NOTE — Telephone Encounter (Signed)
Spoke with pt. He is requesting a prescription for Incruse. I do not see this medication list. I review RA's last OV, RA instructed for the pt to be given a sample of Incruse and to call us if he liked it and we would give him a prescription. This sample was not documented. Rx has been sent in. Nothing further was needed.

## 2017-06-09 ENCOUNTER — Encounter: Payer: Self-pay | Admitting: Cardiovascular Disease

## 2017-06-09 ENCOUNTER — Ambulatory Visit (INDEPENDENT_AMBULATORY_CARE_PROVIDER_SITE_OTHER): Payer: Medicare Other | Admitting: Cardiovascular Disease

## 2017-06-09 VITALS — BP 106/62 | HR 62 | Ht 68.0 in | Wt 168.0 lb

## 2017-06-09 DIAGNOSIS — I10 Essential (primary) hypertension: Secondary | ICD-10-CM | POA: Diagnosis not present

## 2017-06-09 DIAGNOSIS — I5032 Chronic diastolic (congestive) heart failure: Secondary | ICD-10-CM

## 2017-06-09 DIAGNOSIS — E7800 Pure hypercholesterolemia, unspecified: Secondary | ICD-10-CM

## 2017-06-09 DIAGNOSIS — I2510 Atherosclerotic heart disease of native coronary artery without angina pectoris: Secondary | ICD-10-CM | POA: Diagnosis not present

## 2017-06-09 DIAGNOSIS — I493 Ventricular premature depolarization: Secondary | ICD-10-CM | POA: Diagnosis not present

## 2017-06-09 MED ORDER — METOPROLOL TARTRATE 25 MG PO TABS: 12.5000 mg | ORAL_TABLET | Freq: Two times a day (BID) | ORAL | 3 refills | Status: DC

## 2017-06-09 MED ORDER — FUROSEMIDE 40 MG PO TABS: ORAL_TABLET | ORAL | 3 refills | Status: DC

## 2017-06-09 NOTE — Progress Notes (Signed)
Chief Complaint  Patient presents with  . Coronary Artery Disease    History of Present Illness: 84 yo male with history of PE/DVT,  PVCs, NSVT, GI bleeding, COPD, prostate cancer, OSA, HLD, chronic kidney disease who is here today for cardiac follow up. He was seen as a new patient 03/25/14 for cardiac evaluation after he arrived back in [place] from a trip to [place]. He was in [place] September 2015 and had a syncopal episode. He was taken to a [facility] where he was diagnosed with a non-STEMI. His coumadin was held and he was placed on IV heparin but he developed a GI bleed and no cath was performed. Upper endoscopy showed multiple bleeding gastric ulcers. He also had runs of non-sustained VT and was discharged on metoprolol. Echo 02/08/14 from [facility] with LVEF=40-45%. I arranged a cardiac cath after his first visit but this was cancelled pending GI workup. EGD 04/03/14 with active bleeding ulcer in the duodenal bulb. Otherwise EGD was normal. Severe sigmoid diverticulosis noted on colonoscopy.Follow up EGD May 2016 with healed ulcer. He was admitted to [facility] 10/24/14 with dyspnea and found to have recurrent PE. He was started on Eliquis. Echo 10/24/14 with normal LV function, aortic valve sclerosis. He was seen here 11/07/14 and was doing well. He was seen in the the Pulmonary office the following week and his HR was felt to be in the 40s but no EKG was done. I saw him 11/22/14 and EKG showed ventricular bigeminy with overall HR in the 80s. He was started on a beta blocker and he did well. Cardiac monitor 2017 with 72,000 PVCs over 48 hours. He was seen in the EP clinic by Dr. Lovena Le and medical therapy was continued. Echo 10/27/16 with LVEF=45-50%, no valve disease.  He is intolerant of ASA (hives). He was in [place] this summer and had a CT chest that showed emphysema and a spot in his spine. He had a biopsy on his spine in [place] and has followed up with Dr. Trenton Gammon in our Neurosurgery office here.  This is not felt to be cancer. He has been treated with antibiotics.   He is here today for follow up. The patient denies any chest pain, palpitations, lower extremity edema, orthopnea, PND, dizziness, near syncope or syncope. He has baseline dyspnea.   Primary Care Physician: Merrilee Seashore, MD  Past Medical History:  Diagnosis Date  . Allergic rhinitis   . Allergy   . Asthma   . Atrial fibrillation ([place])   . Bleeding gastric ulcer   . Blood transfusion without reported diagnosis   . Cataract    bilateral removed  . Chronic renal insufficiency, stage III (moderate) (HCC)   . COPD (chronic obstructive pulmonary disease) ([place])   . Diabetes mellitus without complication ([place])    no meds- boarderline  . Duodenum ulcer    with perforation  . DVT of axillary vein, acute left ([place]) 10/24/2014  . Emphysema   . GERD (gastroesophageal reflux disease)   . H/O hiatal hernia   . High cholesterol   . Hypertension   . Mixed hyperlipidemia   . Myocardial infarction ([place])   . Neuromuscular disorder ([place])    reports shaking, somedays "can't hold spoon" - generalized  shakiness, is going to talk to MD about it at next appt.   . Normal cardiac stress test    reports stress test was several yrs. ago /w [employer]. Dr. Rogelia Mire, WNL   .  OSA (obstructive sleep apnea) 2007   CPAP- report of settings on paper chart, seen by [facility] - seen 07/2011   . PE (pulmonary embolism) 10/2011  . Prostate cancer ([place]) 1992   in remission.  s/p XRT, melanoma under left eye, left temple  . Recurrent upper respiratory infection (URI)    coughing from chronic sinusitis   . Renal stone    some passed spontaneous, & also had cystoscopy  . Shortness of breath on exertion   . Sleep apnea    wears c-pap    Past Surgical History:  Procedure Laterality Date  . CATARACT EXTRACTION W/ INTRAOCULAR LENS  IMPLANT, BILATERAL  ~ 2000  . COLONOSCOPY N/A 04/03/2014   Procedure: COLONOSCOPY;  Surgeon: Inda Castle,  MD;  Location: [facility];  Service: Endoscopy;  Laterality: N/A;  . ESOPHAGOGASTRODUODENOSCOPY N/A 08/28/2013   Procedure: ESOPHAGOGASTRODUODENOSCOPY (EGD);  Surgeon: Gayland Curry, MD;  Location: [place];  Service: General;  Laterality: N/A;  . ESOPHAGOGASTRODUODENOSCOPY N/A 04/03/2014   Procedure: ESOPHAGOGASTRODUODENOSCOPY (EGD);  Surgeon: Inda Castle, MD;  Location: [facility];  Service: Endoscopy;  Laterality: N/A;  . GASTROSTOMY N/A 08/28/2013   Procedure: GASTROSTOMY;  Surgeon: Rolm Bookbinder, MD;  Location: [facility];  Service: General;  Laterality: N/A;  . LAPAROTOMY N/A 08/28/2013   Procedure: EXPLORATORY LAPAROTOMY;  Surgeon: Rolm Bookbinder, MD;  Location: [facility];  Service: General;  Laterality: N/A;  . LOBECTOMY Right 1986   1/3 removed   . LUNG SURGERY  1986   presumed from RLL. Segmentectomy. Benign Nodule.   [name] Kitchen MAXILLARY ANTROSTOMY  09/15/2011   Procedure: MAXILLARY ANTROSTOMY;  Surgeon: Ascencion Dike, MD;  Location: [facility];  Service: ENT;  Laterality: Bilateral;  TOTAL MAXILLARY ANTROSTOMY  . MELANOMA EXCISION Left    under left eye  . REPAIR OF PERFORATED ULCER N/A 08/28/2013   Procedure: DUODENAL ULCER PATCH;  Surgeon: Rolm Bookbinder, MD;  Location: [facility];  Service: General;  Laterality: N/A;  . SEPTOPLASTY  09/15/11  . SEPTOPLASTY  09/15/2011   Procedure: SEPTOPLASTY;  Surgeon: Ascencion Dike, MD;  Location: [place];  Service: ENT;  Laterality: Bilateral;  . SINUS ENDO W/FUSION  sch. 09/15/2011   Procedure: ENDOSCOPIC SINUS SURGERY WITH FUSION NAVIGATION;  Surgeon: Ascencion Dike, MD;  Location: [place];  Service: ENT;  Laterality: Bilateral;  bilateral maxillary antrostomy, bilateral endoscopic ethmoidectomy, bilateral sphenoidectomy, bilateral frontal recess exploration  . SINUS ENDO W/FUSION  09/15/2011   Procedure: ENDOSCOPIC SINUS SURGERY WITH FUSION NAVIGATION;  Surgeon: Ascencion Dike, MD;  Location: [place];  Service: ENT;  Laterality: Bilateral;  . SINUS  EXPLORATION  09/15/2011   Procedure: SINUS EXPLORATION;  Surgeon: Ascencion Dike, MD;  Location: [place];  Service: ENT;  Laterality: Bilateral;  FRONTAL RECESS EXPLORATION  . SPHENOIDECTOMY  09/15/2011   Procedure: Coralee Pesa;  Surgeon: Ascencion Dike, MD;  Location: [place] [place];  Service: ENT;  Laterality: Bilateral;  . URETEROSCOPY  1980's    Current Outpatient Medications  Medication Sig Dispense Refill  . albuterol (PROVENTIL HFA;VENTOLIN HFA) 108 (90 Base) MCG/ACT inhaler Inhale 2 puffs into the lungs every 6 (six) hours as needed for wheezing or shortness of breath. 1 Inhaler 2  . atorvastatin (LIPITOR) 10 MG tablet Take 10 mg by mouth daily at 6 PM.     . budesonide-formoterol (SYMBICORT) 160-4.5 MCG/ACT inhaler Inhale 2 puffs 2 (two) times daily into the lungs. 1 Inhaler 0  . ELIQUIS 5 MG TABS tablet TAKE  1 TABLET(5 MG) BY MOUTH TWICE DAILY 180 tablet 3  . esomeprazole (NEXIUM) 40 MG capsule Take 1 capsule (40 mg total) by mouth daily. 90 capsule 6  . fluocinonide cream (LIDEX) 6.81 % Apply 1 application topically daily as needed (rash). To infected area.    . fluticasone (FLONASE) 50 MCG/ACT nasal spray Place 2 sprays into the nose at bedtime. Each nostril once a day.    . furosemide (LASIX) 40 MG tablet TAKE 1 TABLET(40 MG) BY MOUTH DAILY 90 tablet 3  . metoprolol tartrate (LOPRESSOR) 25 MG tablet Take 0.5 tablets (12.5 mg total) by mouth 2 (two) times daily. 90 tablet 3  . nitroGLYCERIN (NITROSTAT) 0.4 MG SL tablet Place 1 tablet (0.4 mg total) under the tongue every 5 (five) minutes as needed for chest pain. 75 tablet 3  . SYMBICORT 160-4.5 MCG/ACT inhaler INHALE 2 PUFFS BY MOUTH TWICE DAILY 10.2 g 0  . tamsulosin (FLOMAX) 0.4 MG CAPS capsule Take 0.4 mg by mouth daily.    [name] Kitchen umeclidinium bromide (INCRUSE ELLIPTA) 62.5 MCG/INH AEPB Inhale 1 puff into the lungs daily. 30 each 5   No current facility-administered medications for this visit.     Allergies  Allergen Reactions  . Mometasone  Furoate Other (See Comments)    Nasonex: CAUSED RED,ITCHY EYES; "lost my eyelashes"   . Naproxen Sodium Nausea And Vomiting  . Nsaids Other (See Comments)    Stomach ulcers  . Anoro Ellipta [Umeclidinium-Vilanterol] Other (See Comments)    Double vision  . Aspirin Hives and Nausea And Vomiting     Chills, sweats  . Bevespi Aerosphere [Glycopyrrolate-Formoterol] Hives  . Pulmicort [Budesonide] Hives  . Breo Ellipta [Fluticasone Furoate-Vilanterol] Hives  . Dexilant [Dexlansoprazole] Diarrhea    Cramps   . Omnaris [Ciclesonide] Other (See Comments)    Nose bleed   . Spiriva Handihaler [Tiotropium Bromide Monohydrate] Other (See Comments)    Hiccups     Social History   Socioeconomic History  . Marital status: Married    Spouse name: Not on file  . Number of children: 4  . Years of education: Not on file  . Highest education level: Not on file  Social Needs  . Financial resource strain: Not on file  . Food insecurity - worry: Not on file  . Food insecurity - inability: Not on file  . Transportation needs - medical: Not on file  . Transportation needs - non-medical: Not on file  Occupational History  . Occupation: 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   Tobacco Use  . Smoking status: Former Smoker    Packs/day: 1.00    Years: 60.00    Pack years: 60.00    Types: Cigarettes    Last attempt to quit: 08/06/2011    Years since quitting: 5.8  . Smokeless tobacco: Never Used  Substance and Sexual Activity  . Alcohol use: Yes    Alcohol/week: 0.0 oz    Comment: rarely  . Drug use: No  . Sexual activity: No  Other Topics Concern  . Not on file  Social History Narrative   Traveled to [place].   Married and lives with wife.   Originally from [place].   Spends winters in [place]   Has 2 kids in [place]       Family History  Problem Relation Age of Onset  . Asthma Father   . Heart disease Mother        MI age 73  . Heart attack Mother   . Prostate cancer Brother   .  Prostate cancer  Brother   . Prostate cancer Brother   . Stroke Sister   . Anesthesia problems Neg Hx   . Colon cancer Neg Hx   . Esophageal cancer Neg Hx   . Rectal cancer Neg Hx   . Stomach cancer Neg Hx     Review of Systems:  As stated in the HPI and otherwise negative.   BP 106/62   Pulse 62   Ht 5\' 8"  (1.727 m)   Wt 168 lb (76.2 kg)   SpO2 95%   BMI 25.54 kg/m   Physical Examination:  General: Well developed, well nourished, NAD  HEENT: OP clear, mucus membranes moist  SKIN: warm, dry. No rashes. Neuro: No focal deficits  Musculoskeletal: Muscle strength 5/5 all ext  Psychiatric: Mood and affect normal  Neck: No JVD, no carotid bruits, no thyromegaly, no lymphadenopathy.  Lungs:Clear bilaterally, no wheezes, rhonci, crackles Cardiovascular: Regular rate and rhythm. No murmurs, gallops or rubs. Abdomen:Soft. Bowel sounds present. Non-tender.  Extremities: No lower extremity edema. Pulses are 2 + in the bilateral DP/PT.  Echo May 2018:  Left ventricle: Septal and inferior basal hypokinesis. The cavity   size was normal. Systolic function was mildly reduced. The   estimated ejection fraction was in the range of 45% to 50%. Wall   motion was normal; there were no regional wall motion   abnormalities. Doppler parameters are consistent with abnormal   left ventricular relaxation (grade 1 diastolic dysfunction). - Atrial septum: No defect or patent foramen ovale was identified.  EKG:  EKG is not ordered today The ekg ordered today demonstrates   Recent Labs: No results found for requested labs within last 8760 hours.   Lipid Panel    Component Value Date/Time   TRIG 56 08/28/2013 1620     Wt Readings from Last 3 Encounters:  06/09/17 168 lb (76.2 kg)  04/11/17 163 lb 6.4 oz (74.1 kg)  11/15/16 168 lb (76.2 kg)     Other studies Reviewed: Additional studies/ records that were reviewed today include:  Review of the above records demonstrates:    Assessment and Plan:    1. CAD without angina: He has presumed CAD but has had no cath due to issues with prior GI bleeding while on heparin and then DVT/PE requiring Eliquis. He has no chest pain at this time. Will continue statin and beta blocker. If he has chest pain that is concerning for angina, could consider cath.   2. HLD: Continue statin. Lipids well controlled in primary care per pt  3. DVT/PE: Followed in Pulmonary/primary care. Continue Eliquis  4. PVCs with bigeminy/trigeminy: He is not having palpitations. Continue beta blocker.    5. Chronic diastolic CHF: Volume status is ok. Continue Lasix.     6. HTN: BP controlled. No changes.   Current medicines are reviewed at length with the patient today.  The patient does not have concerns regarding medicines.  The following changes have been made:  no change  Labs/ tests ordered today include:   No orders of the defined types were placed in this encounter.   Disposition:   FU with me in 6 months  Signed, Lauree Chandler, MD 06/09/2017 11:09 AM    [facility] [address], [place], [place]  [address] Phone: [phone]; Fax: [phone]

## 2017-06-09 NOTE — Patient Instructions (Signed)

## 2017-06-23 ENCOUNTER — Telehealth: Payer: Self-pay | Admitting: Pulmonary Disease

## 2017-06-23 ENCOUNTER — Ambulatory Visit (INDEPENDENT_AMBULATORY_CARE_PROVIDER_SITE_OTHER): Payer: Medicare Other | Admitting: Internal Medicine

## 2017-06-23 ENCOUNTER — Encounter: Payer: Self-pay | Admitting: Internal Medicine

## 2017-06-23 VITALS — BP 116/78 | HR 81 | Ht 68.0 in | Wt 164.0 lb

## 2017-06-23 DIAGNOSIS — I2510 Atherosclerotic heart disease of native coronary artery without angina pectoris: Secondary | ICD-10-CM

## 2017-06-23 DIAGNOSIS — R05 Cough: Secondary | ICD-10-CM

## 2017-06-23 DIAGNOSIS — J449 Chronic obstructive pulmonary disease, unspecified: Secondary | ICD-10-CM

## 2017-06-23 DIAGNOSIS — J441 Chronic obstructive pulmonary disease with (acute) exacerbation: Secondary | ICD-10-CM | POA: Diagnosis not present

## 2017-06-23 DIAGNOSIS — R058 Other specified cough: Secondary | ICD-10-CM

## 2017-06-23 MED ORDER — PREDNISONE 10 MG PO TABS: ORAL_TABLET | ORAL | 0 refills | Status: DC

## 2017-06-23 MED ORDER — BUDESONIDE-FORMOTEROL FUMARATE 160-4.5 MCG/ACT IN AERO: 2.0000 | INHALATION_SPRAY | Freq: Two times a day (BID) | RESPIRATORY_TRACT | 0 refills | Status: DC

## 2017-06-23 MED ORDER — AZITHROMYCIN 250 MG PO TABS: ORAL_TABLET | ORAL | 0 refills | Status: DC

## 2017-06-23 NOTE — Telephone Encounter (Signed)
Called and spoke to pt. Pt c/o SOB and wheezing since 06/18/2017. Pt states the incruse he was started on is not helping. Appt made with MW at 4:30pm. Pt verbalized understanding and denied any further questions or concerns at this time.

## 2017-06-23 NOTE — Patient Instructions (Addendum)
nexium 40 mg Take 30- 60 min before your first and last meals of the day   GERD (REFLUX)  is an extremely common cause of respiratory symptoms just like yours , many times with no obvious heartburn at all.    It can be treated with medication, but also with lifestyle changes including elevation of the head of your bed (ideally with 6 inch  bed blocks),  Smoking cessation, avoidance of late meals, excessive alcohol, and avoid fatty foods, chocolate, peppermint, colas, red wine, and acidic juices such as orange juice.  NO MINT OR MENTHOL PRODUCTS SO NO COUGH DROPS  USE SUGARLESS CANDY INSTEAD (Jolley ranchers or Stover's or Life Savers) or even ice chips will also do - the key is to swallow to prevent all throat clearing. NO OIL BASED VITAMINS - use powdered substitutes.    zpak called in plus Prednisone 10 mg take  4 each am x 2 days,   2 each am x 2 days,  1 each am x 2 days and stop   Plan A = Automatic = symbicort 160  Take 2 puffs first thing in am and then another 2 puffs about 12 hours later and stop incruse    Work on inhaler technique:  relax and gently blow all the way out then take a nice smooth deep breath back in, triggering the inhaler at same time you start breathing in.  Hold for up to 5 seconds if you can. Blow out thru nose. Rinse and gargle with water when done     Plan B = Backup Only use your albuterol (Proair) as a rescue medication to be used if you can't catch your breath by resting or doing a relaxed purse lip breathing pattern.  - The less you use it, the better it will work when you need it. - Ok to use the inhaler up to 2 puffs  every 4 hours if you must but call for appointment if use goes up over your usual need - Don't leave home without it !!  (think of it like the spare tire for your car)   Plan C = Crisis - only use your albuterol nebulizer if you first try Plan B and it fails to help > ok to use the nebulizer up to every 4 hours but if start needing it  regularly call for immediate appointment   Please schedule a follow up office visit in 6 weeks, call sooner if needed to see Dr Elsworth Soho and PFTs on return

## 2017-06-23 NOTE — Progress Notes (Signed)
Subjective:     Patient ID: Jon White, male   DOB: 02/10/1933,    MRN: [id number]  HPI   Chief Complaint  Patient presents with  . Acute Visit    shortness of breath, wheezing, coughing/productive (clear/white)    84 yowm quit smoking 07/2011 with pfts c/w GOLD II with marked reversibility as of 06/12/12  With new hoareness x winter 2018 [facility] eval > "VC not closing"  And new  Sob indolent onset progressively worse x 2 weeks prior to OV fine sitting still and fine on cpap but sob with more than 50 ft slow waling = MMRC3 = can't walk 100 yards even at a slow pace at a flat grade s stopping due to sob    Coughing more than usual / slt yellow/  Rescue inhaler not helping / neb helps more and using 3-4  Vs baseline at least once a day. Was on incruse but felt like it made cough worse so back on symbicort with poor hfa  (see a/p)  No obvious day to day or daytime variability or assoc  mucus plugs or hemoptysis or cp or chest tightness, subjective wheeze or overt sinus or hb symptoms. No unusual exposure hx or h/o childhood pna/ asthma or knowledge of premature birth.  Sleeping ok flat on cpap  without nocturnal  or early am exacerbation  of respiratory  c/o's or need for noct saba. Also denies any obvious fluctuation of symptoms with weather or environmental changes or other aggravating or alleviating factors except as outlined above   Current Allergies, Complete Past Medical History, Past Surgical History, Family History, and Social History were reviewed in [employer] record.  ROS  The following are not active complaints unless bolded Hoarseness, sore throat, dysphagia, dental problems, itching, sneezing,  nasal congestion or discharge of excess mucus or purulent secretions, ear ache,   fever, chills, sweats, unintended wt loss or wt gain, classically pleuritic or exertional cp,  orthopnea pnd or leg swelling, presyncope, palpitations, abdominal pain, anorexia, nausea,  vomiting, diarrhea  or change in bowel habits or change in bladder habits, change in stools or change in urine, dysuria, hematuria,  rash, arthralgias, visual complaints, headache, numbness, weakness or ataxia or problems with walking or coordination,  change in mood/affect or memory.        Current Meds  Medication Sig  . albuterol (PROVENTIL HFA;VENTOLIN HFA) 108 (90 Base) MCG/ACT inhaler Inhale 2 puffs into the lungs every 6 (six) hours as needed for wheezing or shortness of breath.  [name] Kitchen albuterol (PROVENTIL) (2.5 MG/3ML) 0.083% nebulizer solution Take 2.5 mg by nebulization 4 (four) times daily.  [name] Kitchen atorvastatin (LIPITOR) 10 MG tablet Take 10 mg by mouth daily at 6 PM.   . budesonide-formoterol (SYMBICORT) 160-4.5 MCG/ACT inhaler Inhale 2 puffs into the lungs 2 (two) times daily.  [name] Kitchen ELIQUIS 5 MG TABS tablet TAKE 1 TABLET(5 MG) BY MOUTH TWICE DAILY  . esomeprazole (NEXIUM) 40 MG capsule Take 1 capsule (40 mg total) by mouth daily.  . ferrous sulfate 220 (44 Fe) MG/5ML solution Take 220 mg by mouth every other day.  . fluocinonide cream (LIDEX) 3.61 % Apply 1 application topically daily as needed (rash). To infected area.  . fluticasone (FLONASE) 50 MCG/ACT nasal spray Place 2 sprays into the nose at bedtime. Each nostril once a day.  . furosemide (LASIX) 40 MG tablet TAKE 1 TABLET(40 MG) BY MOUTH DAILY  . metoprolol tartrate (LOPRESSOR) 25 MG tablet Take 0.5 tablets (  12.5 mg total) by mouth 2 (two) times daily.  . nitroGLYCERIN (NITROSTAT) 0.4 MG SL tablet Place 1 tablet (0.4 mg total) under the tongue every 5 (five) minutes as needed for chest pain.  . tamsulosin (FLOMAX) 0.4 MG CAPS capsule Take 0.4 mg by mouth daily.  .      .           Review of Systems     Objective:   Physical Exam    extremely hoarse amb wm   Wt Readings from Last 3 Encounters:  06/23/17 164 lb (74.4 kg)  06/09/17 168 lb (76.2 kg)  04/11/17 163 lb 6.4 oz (74.1 kg)     Vital signs reviewed - Note on  arrival 02 sats  94% on RA      HEENT: nl dentition, turbinates bilaterally, and oropharynx. Nl external ear canals without cough reflex   NECK :  without JVD/Nodes/TM/ nl carotid upstrokes bilaterally   LUNGS: no acc muscle use,  Barrel chest with decreased bs generally esp rll / distant wheeze late exp and hyper resonant to percussion bilaterally    CV:  RRR  no s3 or murmur or increase in P2, and no edema   ABD:  soft and nontender with nl inspiratory excursion in the supine position. No bruits or organomegaly appreciated, bowel sounds nl  MS:  Nl gait/ ext warm without deformities, calf tenderness, cyanosis or clubbing No obvious joint restrictions   SKIN: warm and dry without lesions    NEURO:  alert, approp, nl sensorium with  no motor or cerebellar deficits apparent.     Assessment:

## 2017-06-24 ENCOUNTER — Encounter: Payer: Self-pay | Admitting: Internal Medicine

## 2017-06-24 DIAGNOSIS — R05 Cough: Secondary | ICD-10-CM | POA: Insufficient documentation

## 2017-06-24 DIAGNOSIS — J441 Chronic obstructive pulmonary disease with (acute) exacerbation: Secondary | ICD-10-CM | POA: Insufficient documentation

## 2017-06-24 DIAGNOSIS — R058 Other specified cough: Secondary | ICD-10-CM | POA: Insufficient documentation

## 2017-06-24 NOTE — Assessment & Plan Note (Addendum)
PFT's  06/12/12  FEV1 1.49 (59 % ) ratio 44  p 38 % improvement from saba p ? prior to study with DLCO  56 % corrects to 72 % for alv volume - 06/23/2017  After extensive coaching inhaler device  effectiveness =    75% from a basline of 50% (short Ti)   DDX of  difficult airways management almost all start with A and  include Adherence, Ace Inhibitors, Acid Reflux, Active Sinus Disease, Alpha 1 Antitripsin deficiency, Anxiety masquerading as Airways dz,  ABPA,  Allergy(esp in young), Aspiration (esp in elderly), Adverse effects of meds,  Active smokers, A bunch of PE's (a small clot burden can't cause this syndrome unless there is already severe underlying pulm or vascular dz with poor reserve) plus two Bs  = Bronchiectasis and Beta blocker use..and one C= CHF  Adherence is always the initial "prime suspect" and is a multilayered concern that requires a "trust but verify" approach in every patient - starting with knowing how to use medications, especially inhalers, correctly, keeping up with refills and understanding the fundamental difference between maintenance and prns vs those medications only taken for a very short course and then stopped and not refilled.  - see hfa teaching  - return with all meds in hand using a trust but verify approach to confirm accurate Medication  Reconciliation The principal here is that until we are certain that the  patients are doing what we've asked, it makes no sense to ask them to do more.    ? Acid (or non-acid) GERD > always difficult to exclude as up to 75% of pts in some series report no assoc GI/ Heartburn symptoms> rec max (24h)  acid suppression and diet restrictions/ reviewed and instructions given in writing.   ?allergy component > Prednisone 10 mg take  4 each am x 2 days,   2 each am x 2 days,  1 each am x 2 days and stop   ? Active sinus dz > suspect just rhinitis, likely viral > rx zpak only for now   ? Adverse effects of dpi > change all rx to  [facility]  ? chf > echo 09/2016 : icle: Septal and inferior basal hypokinesis. The cavity   size was normal. Systolic function was mildly reduced. The   estimated ejection fraction was in the range of 45% to 50%. Wall   motion was normal; there were no regional wall motion   abnormalities. Doppler parameters are consistent with abnormal   left ventricular relaxation (grade 1 diastolic dysfunction). - Atrial septum: No defect or patent foramen ovale was identified. rx cards/ [facility], no evidence decompensation but is at risk for cardiac asthma  F/u in 6 weeks with new set of pfts ? Add back lama (note multiple reported drug intol)    I had an extended discussion with the patient reviewing all relevant studies completed to date and  lasting 25 minutes of a 40  minute acute office  visit with pt new to me     re  severe non-specific but potentially very serious refractory respiratory symptoms of uncertain and potentially multiple  etiologies.   Each maintenance medication was reviewed in detail including most importantly the difference between maintenance and prns and under what circumstances the prns are to be triggered using an action plan format that is not reflected in the computer generated alphabetically organized AVS.    Please see AVS for specific instructions unique to this office visit that I  personally wrote and verbalized to the the pt in detail and then reviewed with pt  by my nurse highlighting any changes in therapy/plan of care  recommended at today's visit.

## 2017-06-24 NOTE — Assessment & Plan Note (Signed)
The fact that feels fine when supine on cpap is c/w component of vcd/ Upper airway cough syndrome (previously labeled PNDS),  is so named because it's frequently impossible to sort out how much is  CR/sinusitis with freq throat clearing (which can be related to primary GERD)   vs  causing  secondary (" extra esophageal")  GERD from wide swings in gastric pressure that occur with throat clearing, often  promoting self use of mint and menthol lozenges that reduce the lower esophageal sphincter tone and exacerbate the problem further in a cyclical fashion.   These are the same pts (now being labeled as having "irritable larynx syndrome" by some cough centers) who not infrequently have a history of having failed to tolerate ace inhibitors,  dry powder inhalers or biphosphonates or report having atypical/extraesophageal reflux symptoms that don't respond to standard doses of PPI  and are easily confused as having aecopd or asthma flares by even experienced allergists/ pulmonologists (myself included).   Of the three most common causes of  Sub-acute or recurrent or chronic cough, only one (GERD)  can actually contribute to/ trigger  the other two (asthma and post nasal drip syndrome)  and perpetuate the cylce of cough.  While not intuitively obvious, many patients with chronic low grade reflux do not cough until there is a primary insult that disturbs the protective epithelial barrier and exposes sensitive nerve endings.   This is typically viral as likely the case here  but can be direct physical injury such as with an endotracheal tube.   The point is that once this occurs, it is difficult to eliminate the cycle  using anything but a maximally effective acid suppression regimen at least in the short run, accompanied by an appropriate diet to address non acid GERD when flares with cough > advised  Also has f/u with ENT planned

## 2017-07-14 ENCOUNTER — Other Ambulatory Visit: Payer: Self-pay | Admitting: Internal Medicine

## 2017-07-16 ENCOUNTER — Other Ambulatory Visit: Payer: Self-pay | Admitting: Internal Medicine

## 2017-07-18 MED ORDER — BUDESONIDE-FORMOTEROL FUMARATE 160-4.5 MCG/ACT IN AERO: 2.0000 | INHALATION_SPRAY | Freq: Two times a day (BID) | RESPIRATORY_TRACT | 0 refills | Status: DC

## 2017-07-24 ENCOUNTER — Other Ambulatory Visit: Payer: Self-pay

## 2017-07-24 ENCOUNTER — Encounter (HOSPITAL_COMMUNITY): Payer: Self-pay | Admitting: *Deleted

## 2017-07-24 ENCOUNTER — Inpatient Hospital Stay (HOSPITAL_COMMUNITY)
Admission: EM | Admit: 2017-07-24 | Discharge: 2017-07-29 | DRG: 190 | Disposition: A | Discharge status: Home / Self Care | Payer: Medicare Other | Attending: Internal Medicine | Admitting: Internal Medicine

## 2017-07-24 ENCOUNTER — Emergency Department (HOSPITAL_COMMUNITY): Payer: Medicare Other

## 2017-07-24 DIAGNOSIS — Z86718 Personal history of other venous thrombosis and embolism: Secondary | ICD-10-CM

## 2017-07-24 DIAGNOSIS — I10 Essential (primary) hypertension: Secondary | ICD-10-CM | POA: Diagnosis present

## 2017-07-24 DIAGNOSIS — I2782 Chronic pulmonary embolism: Secondary | ICD-10-CM | POA: Diagnosis present

## 2017-07-24 DIAGNOSIS — Z888 Allergy status to other drugs, medicaments and biological substances status: Secondary | ICD-10-CM

## 2017-07-24 DIAGNOSIS — N189 Chronic kidney disease, unspecified: Secondary | ICD-10-CM

## 2017-07-24 DIAGNOSIS — N183 Chronic kidney disease, stage 3 unspecified: Secondary | ICD-10-CM | POA: Diagnosis present

## 2017-07-24 DIAGNOSIS — R0682 Tachypnea, not elsewhere classified: Secondary | ICD-10-CM | POA: Diagnosis not present

## 2017-07-24 DIAGNOSIS — Z8546 Personal history of malignant neoplasm of prostate: Secondary | ICD-10-CM

## 2017-07-24 DIAGNOSIS — E782 Mixed hyperlipidemia: Secondary | ICD-10-CM | POA: Diagnosis present

## 2017-07-24 DIAGNOSIS — Z825 Family history of asthma and other chronic lower respiratory diseases: Secondary | ICD-10-CM

## 2017-07-24 DIAGNOSIS — R062 Wheezing: Secondary | ICD-10-CM | POA: Diagnosis not present

## 2017-07-24 DIAGNOSIS — I5042 Chronic combined systolic (congestive) and diastolic (congestive) heart failure: Secondary | ICD-10-CM | POA: Diagnosis not present

## 2017-07-24 DIAGNOSIS — N400 Enlarged prostate without lower urinary tract symptoms: Secondary | ICD-10-CM | POA: Diagnosis present

## 2017-07-24 DIAGNOSIS — Z86711 Personal history of pulmonary embolism: Secondary | ICD-10-CM

## 2017-07-24 DIAGNOSIS — Z9989 Dependence on other enabling machines and devices: Secondary | ICD-10-CM

## 2017-07-24 DIAGNOSIS — I130 Hypertensive heart and chronic kidney disease with heart failure and stage 1 through stage 4 chronic kidney disease, or unspecified chronic kidney disease: Secondary | ICD-10-CM | POA: Diagnosis present

## 2017-07-24 DIAGNOSIS — I129 Hypertensive chronic kidney disease with stage 1 through stage 4 chronic kidney disease, or unspecified chronic kidney disease: Secondary | ICD-10-CM | POA: Diagnosis not present

## 2017-07-24 DIAGNOSIS — E119 Type 2 diabetes mellitus without complications: Secondary | ICD-10-CM

## 2017-07-24 DIAGNOSIS — K219 Gastro-esophageal reflux disease without esophagitis: Secondary | ICD-10-CM | POA: Diagnosis present

## 2017-07-24 DIAGNOSIS — Z794 Long term (current) use of insulin: Secondary | ICD-10-CM

## 2017-07-24 DIAGNOSIS — E1122 Type 2 diabetes mellitus with diabetic chronic kidney disease: Secondary | ICD-10-CM | POA: Diagnosis present

## 2017-07-24 DIAGNOSIS — R0602 Shortness of breath: Secondary | ICD-10-CM | POA: Diagnosis not present

## 2017-07-24 DIAGNOSIS — Z7951 Long term (current) use of inhaled steroids: Secondary | ICD-10-CM

## 2017-07-24 DIAGNOSIS — R0902 Hypoxemia: Secondary | ICD-10-CM

## 2017-07-24 DIAGNOSIS — I2510 Atherosclerotic heart disease of native coronary artery without angina pectoris: Secondary | ICD-10-CM | POA: Diagnosis present

## 2017-07-24 DIAGNOSIS — I4891 Unspecified atrial fibrillation: Secondary | ICD-10-CM | POA: Diagnosis present

## 2017-07-24 DIAGNOSIS — J441 Chronic obstructive pulmonary disease with (acute) exacerbation: Principal | ICD-10-CM

## 2017-07-24 DIAGNOSIS — J9601 Acute respiratory failure with hypoxia: Secondary | ICD-10-CM

## 2017-07-24 DIAGNOSIS — G252 Other specified forms of tremor: Secondary | ICD-10-CM | POA: Diagnosis present

## 2017-07-24 DIAGNOSIS — Z87891 Personal history of nicotine dependence: Secondary | ICD-10-CM

## 2017-07-24 DIAGNOSIS — Z79899 Other long term (current) drug therapy: Secondary | ICD-10-CM

## 2017-07-24 DIAGNOSIS — G4733 Obstructive sleep apnea (adult) (pediatric): Secondary | ICD-10-CM | POA: Diagnosis present

## 2017-07-24 DIAGNOSIS — I252 Old myocardial infarction: Secondary | ICD-10-CM

## 2017-07-24 DIAGNOSIS — I959 Hypotension, unspecified: Secondary | ICD-10-CM | POA: Diagnosis not present

## 2017-07-24 DIAGNOSIS — Z7901 Long term (current) use of anticoagulants: Secondary | ICD-10-CM

## 2017-07-24 DIAGNOSIS — Z886 Allergy status to analgesic agent status: Secondary | ICD-10-CM

## 2017-07-24 DIAGNOSIS — I82409 Acute embolism and thrombosis of unspecified deep veins of unspecified lower extremity: Secondary | ICD-10-CM | POA: Diagnosis present

## 2017-07-24 HISTORY — DX: Chronic combined systolic (congestive) and diastolic (congestive) heart failure: I50.42

## 2017-07-24 HISTORY — DX: Unspecified diastolic (congestive) heart failure: I50.30

## 2017-07-24 HISTORY — DX: Atherosclerotic heart disease of native coronary artery without angina pectoris: I25.10

## 2017-07-24 LAB — BASIC METABOLIC PANEL
ANION GAP: 13 (ref 5–15)
BUN: 32 mg/dL — ABNORMAL HIGH (ref 6–20)
CHLORIDE: 102 mmol/L (ref 101–111)
CO2: 23 mmol/L (ref 22–32)
Calcium: 9.3 mg/dL (ref 8.9–10.3)
Creatinine, Ser: 1.63 mg/dL — ABNORMAL HIGH (ref 0.61–1.24)
GFR calc Af Amer: 44 mL/min — ABNORMAL LOW (ref >60)
GFR, EST NON AFRICAN AMERICAN: 38 mL/min — AB (ref >60)
GLUCOSE: 111 mg/dL — AB (ref 65–99)
POTASSIUM: 4.1 mmol/L (ref 3.5–5.1)
Sodium: 138 mmol/L (ref 135–145)

## 2017-07-24 LAB — CBC WITH DIFFERENTIAL/PLATELET
Basophils Absolute: 0.2 10*3/uL — ABNORMAL HIGH (ref 0.0–0.1)
Basophils Relative: 2 %
EOS ABS: 0.4 10*3/uL (ref 0.0–0.7)
Eosinophils Relative: 4 %
HCT: 47.1 % (ref 39.0–52.0)
HEMOGLOBIN: 15.5 g/dL (ref 13.0–17.0)
LYMPHS ABS: 2.8 10*3/uL (ref 0.7–4.0)
Lymphocytes Relative: 33 %
MCH: 30.4 pg (ref 26.0–34.0)
MCHC: 32.9 g/dL (ref 30.0–36.0)
MCV: 92.4 fL (ref 78.0–100.0)
Monocytes Absolute: 1 10*3/uL (ref 0.1–1.0)
Monocytes Relative: 12 %
NEUTROS PCT: 49 %
Neutro Abs: 4.2 10*3/uL (ref 1.7–7.7)
Platelets: 215 10*3/uL (ref 150–400)
RBC: 5.1 MIL/uL (ref 4.22–5.81)
RDW: 15.5 % (ref 11.5–15.5)
WBC: 8.5 10*3/uL (ref 4.0–10.5)

## 2017-07-24 LAB — TROPONIN I: Troponin I: <0.03 ng/mL (ref <0.03)

## 2017-07-24 LAB — PHOSPHORUS: Phosphorus: 3.4 mg/dL (ref 2.5–4.6)

## 2017-07-24 LAB — BRAIN NATRIURETIC PEPTIDE: B Natriuretic Peptide: 29 pg/mL (ref 0.0–100.0)

## 2017-07-24 LAB — MAGNESIUM: MAGNESIUM: 1.9 mg/dL (ref 1.7–2.4)

## 2017-07-24 MED ORDER — SODIUM CHLORIDE 0.45 % IV SOLN
INTRAVENOUS | Status: AC
  Administered 2017-07-24 – 2017-07-25 (×2): via INTRAVENOUS

## 2017-07-24 MED ORDER — IPRATROPIUM BROMIDE 0.02 % IN SOLN
1.0000 mg | Freq: Once | RESPIRATORY_TRACT | Status: AC
  Administered 2017-07-24: 1 mg via RESPIRATORY_TRACT
  Filled 2017-07-24: qty 5

## 2017-07-24 MED ORDER — MAGNESIUM SULFATE 2 GM/50ML IV SOLN
2.0000 g | Freq: Once | INTRAVENOUS | Status: AC
  Administered 2017-07-24: 2 g via INTRAVENOUS
  Filled 2017-07-24: qty 50

## 2017-07-24 MED ORDER — METHYLPREDNISOLONE SODIUM SUCC 125 MG IJ SOLR
125.0000 mg | Freq: Once | INTRAMUSCULAR | Status: AC
  Administered 2017-07-24: 125 mg via INTRAVENOUS
  Filled 2017-07-24: qty 2

## 2017-07-24 MED ORDER — ALBUTEROL (5 MG/ML) CONTINUOUS INHALATION SOLN
10.0000 mg/h | INHALATION_SOLUTION | Freq: Once | RESPIRATORY_TRACT | Status: AC
  Administered 2017-07-24: 10 mg/h via RESPIRATORY_TRACT
  Filled 2017-07-24: qty 20

## 2017-07-24 NOTE — ED Notes (Signed)
Resp at bedside

## 2017-07-24 NOTE — ED Notes (Signed)
Dr Ortiz at bedside 

## 2017-07-24 NOTE — ED Provider Notes (Signed)
[facility] EMERGENCY DEPARTMENT Provider Note   CSN: [id number] Arrival date & time: 07/24/17  2022     History   Chief Complaint Chief Complaint  Patient presents with  . Shortness of Breath    HPI Jon White is a 84 y.o. male.  HPI  Pt was seen at 2025.  Per EMS and pt report, c/o gradual onset and worsening of persistent cough, wheezing and SOB for the past 1 week.  Describes his symptoms as "my COPD."  Has been using home MDI and nebs without relief. Pt states he had one episode of chest "heaviness" today that lasted 3 to 4 hours before spontaneously resolving. Pt currently denies CP. EMS states pt's O2 Sats were 84% R/A on scene. Pt does not wear home O2.  Denies palpitations, no back pain, no abd pain, no N/V/D, no fevers, no rash.    Past Medical History:  Diagnosis Date  . Allergic rhinitis   . Allergy   . Asthma   . Atrial fibrillation ([place])   . Bleeding gastric ulcer   . Blood transfusion without reported diagnosis   . Cataract    bilateral removed  . Chronic renal insufficiency, stage III (moderate) (HCC)   . COPD (chronic obstructive pulmonary disease) ([place])   . Diabetes mellitus without complication ([place])    no meds- boarderline  . Diastolic heart failure ([place])   . Duodenum ulcer    with perforation  . DVT of axillary vein, acute left ([place]) 10/24/2014  . Emphysema   . GERD (gastroesophageal reflux disease)   . H/O hiatal hernia   . High cholesterol   . Hypertension   . Mixed hyperlipidemia   . Myocardial infarction ([place])   . Neuromuscular disorder ([place])    reports shaking, somedays "can't hold spoon" - generalized  shakiness, is going to talk to MD about it at next appt.   . Normal cardiac stress test    reports stress test was several yrs. ago /w [employer]. Dr. Rogelia Mire, WNL   . OSA (obstructive sleep apnea) 2007   CPAP- report of settings on paper chart, seen by [facility] - seen 07/2011   . PE (pulmonary embolism) 10/2011  . Prostate cancer  ([place]) 1992   in remission.  s/p XRT, melanoma under left eye, left temple  . Recurrent upper respiratory infection (URI)    coughing from chronic sinusitis   . Renal stone    some passed spontaneous, & also had cystoscopy  . Shortness of breath on exertion   . Sleep apnea    wears c-pap    Patient Active Problem List   Diagnosis Date Noted  . COPD with acute exacerbation ([place]) 06/24/2017  . Upper airway cough syndrome 06/24/2017  . Chronic venous insufficiency 08/29/2015  . Post-phlebitic syndrome 08/29/2015  . Poor circulation of extremity 07/29/2015  . PVC's (premature ventricular contractions) 05/08/2015  . Bradycardia 04/28/2015  . COPD, moderate ([place]) 04/28/2015  . Essential hypertension 11/13/2014  . Other pulmonary embolism without acute cor pulmonale ([place]) 10/25/2014  . CKD (chronic kidney disease), stage III ([place]) 10/25/2014  . DVT (deep venous thrombosis) ([place]) 10/25/2014  . DVT of axillary vein, acute left ([place]) 10/24/2014  . Duodenal ulcer without hemorrhage or perforation and without obstruction 04/03/2014  . Benign esophageal stricture 04/03/2014  . Acute duodenal ulcer with bleeding 03/29/2014  . Perforated viscus 08/28/2013  . Encounter for therapeutic drug monitoring 07/02/2013  . Chronic rhinosinusitis 03/21/2013  . COPD (chronic obstructive pulmonary disease) (  [place]) 06/07/2012  . Chronic pulmonary embolism ([place]) 05/19/2012  . PE (pulmonary thromboembolism) ([place]) 05/18/2012  . STRICTURE AND STENOSIS OF ESOPHAGUS 07/31/2008  . G E R D 05/13/2008  . OSA (obstructive sleep apnea) 05/13/2008  . Cough productive of purulent sputum 05/13/2008    Past Surgical History:  Procedure Laterality Date  . CATARACT EXTRACTION W/ INTRAOCULAR LENS  IMPLANT, BILATERAL  ~ 2000  . COLONOSCOPY N/A 04/03/2014   Procedure: COLONOSCOPY;  Surgeon: Inda Castle, MD;  Location: [facility];  Service: Endoscopy;  Laterality: N/A;  . ESOPHAGOGASTRODUODENOSCOPY N/A 08/28/2013    Procedure: ESOPHAGOGASTRODUODENOSCOPY (EGD);  Surgeon: Gayland Curry, MD;  Location: [place];  Service: General;  Laterality: N/A;  . ESOPHAGOGASTRODUODENOSCOPY N/A 04/03/2014   Procedure: ESOPHAGOGASTRODUODENOSCOPY (EGD);  Surgeon: Inda Castle, MD;  Location: [facility];  Service: Endoscopy;  Laterality: N/A;  . GASTROSTOMY N/A 08/28/2013   Procedure: GASTROSTOMY;  Surgeon: Rolm Bookbinder, MD;  Location: [place];  Service: General;  Laterality: N/A;  . LAPAROTOMY N/A 08/28/2013   Procedure: EXPLORATORY LAPAROTOMY;  Surgeon: Rolm Bookbinder, MD;  Location: [place];  Service: General;  Laterality: N/A;  . LOBECTOMY Right 1986   1/3 removed   . LUNG SURGERY  1986   presumed from RLL. Segmentectomy. Benign Nodule.   [name] Kitchen MAXILLARY ANTROSTOMY  09/15/2011   Procedure: MAXILLARY ANTROSTOMY;  Surgeon: Ascencion Dike, MD;  Location[facility] OR;  Service: ENT;  Laterality: Bilateral;  TOTAL MAXILLARY ANTROSTOMY  . MELANOMA EXCISION Left    under left eye  . REPAIR OF PERFORATED ULCER N/A 08/28/2013   Procedure: DUODENAL ULCER PATCH;  Surgeon: Rolm Bookbinder, MD;  Location: [place];  Service: General;  Laterality: N/A;  . SEPTOPLASTY  09/15/11  . SEPTOPLASTY  09/15/2011   Procedure: SEPTOPLASTY;  Surgeon: Ascencion Dike, MD;  Location: [place];  Service: ENT;  Laterality: Bilateral;  . SINUS ENDO W/FUSION  sch. 09/15/2011   Procedure: ENDOSCOPIC SINUS SURGERY WITH FUSION NAVIGATION;  Surgeon: Ascencion Dike, MD;  Location: [place];  Service: ENT;  Laterality: Bilateral;  bilateral maxillary antrostomy, bilateral endoscopic ethmoidectomy, bilateral sphenoidectomy, bilateral frontal recess exploration  . SINUS ENDO W/FUSION  09/15/2011   Procedure: ENDOSCOPIC SINUS SURGERY WITH FUSION NAVIGATION;  Surgeon: Ascencion Dike, MD;  Location: [place];  Service: ENT;  Laterality: Bilateral;  . SINUS EXPLORATION  09/15/2011   Procedure: SINUS EXPLORATION;  Surgeon: Ascencion Dike, MD;  Location: [place];  Service: ENT;   Laterality: Bilateral;  FRONTAL RECESS EXPLORATION  . SPHENOIDECTOMY  09/15/2011   Procedure: Coralee Pesa;  Surgeon: Ascencion Dike, MD;  Location: [place];  Service: ENT;  Laterality: Bilateral;  . URETEROSCOPY  1980's       Home Medications    Prior to Admission medications   Medication Sig Start Date End Date Taking? Authorizing Provider  albuterol (PROVENTIL HFA;VENTOLIN HFA) 108 (90 Base) MCG/ACT inhaler Inhale 2 puffs into the lungs every 6 (six) hours as needed for wheezing or shortness of breath. 10/08/16   Brand Males, MD  albuterol (PROVENTIL) (2.5 MG/3ML) 0.083% nebulizer solution Take 2.5 mg by nebulization 4 (four) times daily.    [provider]  atorvastatin (LIPITOR) 10 MG tablet Take 10 mg by mouth daily at 6 PM.     [provider]  azithromycin (ZITHROMAX) 250 MG tablet Take 2 on day one then 1 daily x 4 days 06/23/17   Tanda Rockers, MD  budesonide-formoterol [facility]) 160-4.5 MCG/ACT inhaler Inhale  2 puffs into the lungs 2 (two) times daily. 06/23/17   Tanda Rockers, MD  budesonide-formoterol (SYMBICORT) 160-4.5 MCG/ACT inhaler Inhale 2 puffs into the lungs 2 (two) times daily. 07/18/17   Brand Males, MD  ELIQUIS 5 MG TABS tablet TAKE 1 TABLET(5 MG) BY MOUTH TWICE DAILY 02/02/17   Brand Males, MD  esomeprazole (NEXIUM) 40 MG capsule Take 1 capsule (40 mg total) by mouth daily. 05/02/15   Mauri Pole, MD  ferrous sulfate 220 (44 Fe) MG/5ML solution Take 220 mg by mouth every other day.    [provider]  fluocinonide cream (LIDEX) 6.04 % Apply 1 application topically daily as needed (rash). To infected area.    [provider]  fluticasone (FLONASE) 50 MCG/ACT nasal spray Place 2 sprays into the nose at bedtime. Each nostril once a day.    [provider]  furosemide (LASIX) 40 MG tablet TAKE 1 TABLET(40 MG) BY MOUTH DAILY 06/09/17   Burnell Blanks, MD  metoprolol tartrate (LOPRESSOR) 25 MG tablet  Take 0.5 tablets (12.5 mg total) by mouth 2 (two) times daily. 06/09/17   Burnell Blanks, MD  nitroGLYCERIN (NITROSTAT) 0.4 MG SL tablet Place 1 tablet (0.4 mg total) under the tongue every 5 (five) minutes as needed for chest pain. 05/03/16   Burnell Blanks, MD  predniSONE (DELTASONE) 10 MG tablet Take  4 each am x 2 days,   2 each am x 2 days,  1 each am x 2 days and stop 06/23/17   Tanda Rockers, MD  tamsulosin (FLOMAX) 0.4 MG CAPS capsule Take 0.4 mg by mouth daily. 11/06/14   [provider]    Family History Family History  Problem Relation Age of Onset  . Asthma Father   . Heart disease Mother        MI age 62  . Heart attack Mother   . Prostate cancer Brother   . Prostate cancer Brother   . Prostate cancer Brother   . Stroke Sister   . Anesthesia problems Neg Hx   . Colon cancer Neg Hx   . Esophageal cancer Neg Hx   . Rectal cancer Neg Hx   . Stomach cancer Neg Hx     Social History Social History   Tobacco Use  . Smoking status: Former Smoker    Packs/day: 1.00    Years: 60.00    Pack years: 60.00    Types: Cigarettes    Last attempt to quit: 08/06/2011    Years since quitting: 5.9  . Smokeless tobacco: Never Used  Substance Use Topics  . Alcohol use: Yes    Alcohol/week: 0.0 oz    Comment: rarely  . Drug use: No     Allergies   Mometasone furoate; Naproxen sodium; Nsaids; Anoro ellipta [umeclidinium-vilanterol]; Aspirin; Bevespi aerosphere [glycopyrrolate-formoterol]; Pulmicort [budesonide]; Breo ellipta [fluticasone furoate-vilanterol]; Dexilant [dexlansoprazole]; Omnaris [ciclesonide]; and Spiriva handihaler [tiotropium bromide monohydrate]   Review of Systems Review of Systems ROS: Statement: All systems negative except as marked or noted in the HPI; Constitutional: Negative for fever and chills. ; ; Eyes: Negative for eye pain, redness and discharge. ; ; ENMT: Negative for ear pain, hoarseness, nasal congestion, sinus pressure and  sore throat. ; ; Cardiovascular: Negative for chest pain, palpitations, diaphoresis, and peripheral edema. ; ; Respiratory: +cough, wheezing, SOB. Negative for stridor. ; ; Gastrointestinal: Negative for nausea, vomiting, diarrhea, abdominal pain, blood in stool, hematemesis, jaundice and rectal bleeding. . ; ; Genitourinary: Negative  for dysuria, flank pain and hematuria. ; ; Musculoskeletal: Negative for back pain and neck pain. Negative for swelling and trauma.; ; Skin: Negative for pruritus, rash, abrasions, blisters, bruising and skin lesion.; ; Neuro: Negative for headache, lightheadedness and neck stiffness. Negative for weakness, altered level of consciousness, altered mental status, extremity weakness, paresthesias, involuntary movement, seizure and syncope.     Physical Exam Updated Vital Signs BP (!) 134/36 (BP Location: Right Arm)   Pulse 72   Temp 97.6 F (36.4 C) (Oral)   Resp 18   Ht 5\' 8"  (1.727 m)   Wt 74.8 kg (165 lb)   SpO2 96%   BMI 25.09 kg/m   Patient Vitals for the past 24 hrs:  BP Temp Temp src Pulse Resp SpO2 Height Weight  07/24/17 2130 (!) 96/58 - - 71 12 98 % - -  07/24/17 2100 (!) 108/57 - - 67 14 100 % - -  07/24/17 2044 - - - - - 96 % - -  07/24/17 2037 - - - - - - 5\' 8"  (1.727 m) 74.8 kg (165 lb)  07/24/17 2036 (!) 134/36 97.6 F (36.4 C) Oral 72 18 98 % - -  07/24/17 2023 - - - - - - 5\' 8"  (1.727 m) 74.8 kg (165 lb)    Physical Exam 2030: Physical examination:  Nursing notes reviewed; Vital signs and O2 SAT reviewed;  Constitutional: Well developed, Well nourished, Well hydrated, Uncomfortable appearing.;; Head:  Normocephalic, atraumatic; Eyes: EOMI, PERRL, No scleral icterus; ENMT: Mouth and pharynx normal, Mucous membranes moist; Neck: Supple, Full range of motion, No lymphadenopathy; Cardiovascular: Tachycardic rate and rhythm, No gallop; Respiratory: Breath sounds diminished & equal bilaterally, insp/exp wheezes bilat. No audible wheezing.  Speaking  short sentences, sitting upright, tachypneic.; Chest: Nontender, Movement normal; Abdomen: Soft, Nontender, Nondistended, Normal bowel sounds; Genitourinary: No CVA tenderness; Extremities: Pulses normal, No tenderness, No edema, No calf edema or asymmetry.; Neuro: AA&Ox3, Major CN grossly intact.  Speech clear. No gross focal motor or sensory deficits in extremities.; Skin: Color normal, Warm, Dry.    ED Treatments / Results  Labs (all labs ordered are listed, but only abnormal results are displayed)   EKG  EKG Interpretation  Date/Time:  Sunday July 24 2017 20:38:01 EST Ventricular Rate:  69 PR Interval:    QRS Duration: 89 QT Interval:  402 QTC Calculation: 431 R Axis:   90 Text Interpretation:  Sinus rhythm Borderline right axis deviation Probable anteroseptal infarct, old Baseline wander Artifact When compared with ECG of 08/26/2013 Rate slower Confirmed by Francine Graven [phone]) on 07/24/2017 8:46:22 PM       Radiology   Procedures Procedures (including critical care time)  Medications Ordered in ED Medications  methylPREDNISolone sodium succinate (SOLU-MEDROL) 125 mg/2 mL injection 125 mg (125 mg Intravenous Given 07/24/17 2039)  albuterol (PROVENTIL,VENTOLIN) solution continuous neb (10 mg/hr Nebulization Given 07/24/17 2044)  ipratropium (ATROVENT) nebulizer solution 1 mg (1 mg Nebulization Given 07/24/17 2044)     Initial Impression / Assessment and Plan / ED Course  I have reviewed the triage vital signs and the nursing notes.  Pertinent labs & imaging results that were available during my care of the patient were reviewed by me and considered in my medical decision making (see chart for details).  MDM Reviewed: previous chart, nursing note and vitals Reviewed previous: labs and ECG Interpretation: labs, ECG and x-ray Total time providing critical care: 30-74 minutes. This excludes time spent performing separately reportable procedures and  services.   CRITICAL CARE Performed by: Alfonzo Feller Total critical care time: 35 minutes Critical care time was exclusive of separately billable procedures and treating other patients. Critical care was necessary to treat or prevent imminent or life-threatening deterioration. Critical care was time spent personally by me on the following activities: development of treatment plan with patient and/or surrogate as well as nursing, discussions with consultants, evaluation of patient's response to treatment, examination of patient, obtaining history from patient or surrogate, ordering and performing treatments and interventions, ordering and review of laboratory studies, ordering and review of radiographic studies, pulse oximetry and re-evaluation of patient's condition.   Results for orders placed or performed during the hospital encounter of 38/10/17  Basic metabolic panel  Result Value Ref Range   Sodium 138 135 - 145 mmol/L   Potassium 4.1 3.5 - 5.1 mmol/L   Chloride 102 101 - 111 mmol/L   CO2 23 22 - 32 mmol/L   Glucose, Bld 111 (H) 65 - 99 mg/dL   BUN 32 (H) 6 - 20 mg/dL   Creatinine, Ser 1.63 (H) 0.61 - 1.24 mg/dL   Calcium 9.3 8.9 - 10.3 mg/dL   GFR calc non Af Amer 38 (L) >60 mL/min   GFR calc Af Amer 44 (L) >60 mL/min   Anion gap 13 5 - 15  Brain natriuretic peptide  Result Value Ref Range   B Natriuretic Peptide 29.0 0.0 - 100.0 pg/mL  Troponin I  Result Value Ref Range   Troponin I <0.03 <0.03 ng/mL  CBC with Differential  Result Value Ref Range   WBC 8.5 4.0 - 10.5 K/uL   RBC 5.10 4.22 - 5.81 MIL/uL   Hemoglobin 15.5 13.0 - 17.0 g/dL   HCT 47.1 39.0 - 52.0 %   MCV 92.4 78.0 - 100.0 fL   MCH 30.4 26.0 - 34.0 pg   MCHC 32.9 30.0 - 36.0 g/dL   RDW 15.5 11.5 - 15.5 %   Platelets 215 150 - 400 K/uL   Neutrophils Relative % 49 %   Neutro Abs 4.2 1.7 - 7.7 K/uL   Lymphocytes Relative 33 %   Lymphs Abs 2.8 0.7 - 4.0 K/uL   Monocytes Relative 12 %   Monocytes  Absolute 1.0 0.1 - 1.0 K/uL   Eosinophils Relative 4 %   Eosinophils Absolute 0.4 0.0 - 0.7 K/uL   Basophils Relative 2 %   Basophils Absolute 0.2 (H) 0.0 - 0.1 K/uL   Dg Chest Port 1 View Result Date: 07/24/2017 CLINICAL DATA:  Shortness of Breath EXAM: PORTABLE CHEST 1 VIEW COMPARISON:  10/24/2014 FINDINGS: There is hyperinflation of the lungs compatible with COPD. Heart is borderline in size. No confluent airspace opacities or effusions. No acute bony abnormality. IMPRESSION: COPD/chronic changes.  Borderline heart size.  No active disease. Electronically Signed   By: Rolm Baptise M.D.   On: 07/24/2017 20:54   2155:  BUN/Cr per baseline. On arrival: pt sitting upright, tachypneic, tachycardic, Sats 96 % on O2 4L N/C, lungs diminished with wheezing. IV solumedrol and hour long neb started. After neb: pt appears more comfortable at rest, less tachypneic, Sats 98 % on O2 4L N/C, lungs continue diminished. Pt sitting on stretcher and O2 N/C removed: pt's O2 Sats dropped to 84 % R/A with pt c/o increasing SOB, with increasing HR and RR. O2 N/C reapplied with O2 Sats increasing to 96% on O2 4L N/C. Dx and testing d/w pt and family.  Questions answered.  Verb understanding, agreeable  to admit. T/C returned from [facility]. Olevia Bowens, case discussed, including:  HPI, pertinent PM/SHx, VS/PE, dx testing, ED course and treatment:  Agreeable to admit.     Final Clinical Impressions(s) / ED Diagnoses   Final diagnoses:  None    ED Discharge Orders    None       Francine Graven, DO 07/26/17 2152

## 2017-07-24 NOTE — ED Triage Notes (Signed)
Pt brought in by ems for c/o sob that has progressively gotten worse all day; ems arrived on scene with FD administering NRB mask to patient; pt given Solumedrol 125mg  and a duoneb en route by ems; pt states he is coughing up yellow sputum; pt is alert and oriented

## 2017-07-24 NOTE — H&P (Signed)
History and Physical    BROWNING SOUTHWOOD VQQ:[id number] DOB: 1933-03-12 DOA: 07/24/2017  PCP: Merrilee Seashore, MD   Patient coming from: Home.  I have personally briefly reviewed patient's old medical records in [place]  Chief Complaint: Shortness of breath.  HPI: Jon White is a 84 y.o. male with medical history significant of allergic rhinitis, asthma, atrial fibrillation, PUD with history of upper GI bleed, CAD/history of MI, cataract, chronic renal insufficiency stage III, COPD/emphysema, type 2 diabetes, diastolic heart failure, history of DVT and PE, GERD/hiatal hernia, hyperlipidemia, hypertension, OSA on CPAP, prostate CA in remission, urolithiasis, who is coming to the emergency department with complaints of progressively worse shortness of breath, wheezing and persistent cough occasionally producing yellowish sputum for the past 2 weeks.  He mentions that he has tried his rescue inhaler and bronchodilators nebulizer treatments with several significant relief. He denies fever, chills, sore throat, rhinorrhea, hemoptysis, but feels fatigued.    He also states that earlier today he had central chest pain, pressure-like, nonradiating that lasted for about 2 hours and then resolved on its own.  No palpitations, dizziness, diaphoresis, nausea, emesis present with chest pain.  No abdominal pain, diarrhea, constipation, melena or hematochezia.  Denies dysuria, frequency or hematuria.  Denies heat or cold intolerance.  No polyuria, polydipsia or blurred vision.  ED Course: Initial vital signs in the emergency department temperature 36.4C (97.6 F), heart rate 72, respiratory rate 18, blood pressure 134/36 mmHg and O2 sat 98% on room air.  His CBC showed a white count of 8.5 with 49% neutrophils and 33% lymphocytes.  Hemoglobin was 15.5 g/dL and platelets 215.  Electrolytes were normal.  EKG is was sinus rhythm with borderline right axis deviation and old anteroseptal infarct.   Troponin level was normal.  BNP was 29.0 pg/mL.  Sodium 138, potassium 4.1, chloride 102 and CO2 23 millimolar/L.  BUN was 32, creatinine 1.63, calcium 9.3, glucose 111, magnesium 1.9 and phosphorus 3.4 mg/dL.  His chest radiograph showed chronic COPD changes, borderline cardiomegaly, but no active cardiopulmonary disease.  Please see image and full radiology report for further detail.  Medications in ED:The patient received supplemental oxygen, albuterol 10 mg plus ipratropium 1 mg continuous neb treatment x1 and Solu-Medrol 125 mg IVP with partial relief.  I added magnesium sulfate 2 g IVPB.  Review of Systems: As per HPI otherwise 10 point review of systems negative.    Past Medical History:  Diagnosis Date  . Allergic rhinitis   . Allergy   . Asthma   . Atrial fibrillation ([place])   . Bleeding gastric ulcer   . Blood transfusion without reported diagnosis   . CAD (coronary artery disease) 07/24/2017  . Cataract    bilateral removed  . Chronic renal insufficiency, stage III (moderate) (HCC)   . COPD (chronic obstructive pulmonary disease) ([place])   . Diabetes mellitus without complication ([place])    no meds- boarderline  . Diastolic heart failure ([place])   . Duodenum ulcer    with perforation  . DVT of axillary vein, acute left ([place]) 10/24/2014  . Emphysema   . GERD (gastroesophageal reflux disease)   . H/O hiatal hernia   . High cholesterol   . Hypertension   . Mixed hyperlipidemia   . Myocardial infarction ([place])   . Neuromuscular disorder ([place])    reports shaking, somedays "can't hold spoon" - generalized  shakiness, is going to talk to MD about it at next appt.   . Normal  cardiac stress test    reports stress test was several yrs. ago /w [employer]. Dr. Rogelia Mire, WNL   . OSA (obstructive sleep apnea) 2007   CPAP- report of settings on paper chart, seen by [facility] - seen 07/2011   . PE (pulmonary embolism) 10/2011  . Prostate cancer ([place]) 1992   in remission.  s/p XRT, melanoma  under left eye, left temple  . Recurrent upper respiratory infection (URI)    coughing from chronic sinusitis   . Renal stone    some passed spontaneous, & also had cystoscopy  . Shortness of breath on exertion   . Sleep apnea    wears c-pap    Past Surgical History:  Procedure Laterality Date  . CATARACT EXTRACTION W/ INTRAOCULAR LENS  IMPLANT, BILATERAL  ~ 2000  . COLONOSCOPY N/A 04/03/2014   Procedure: COLONOSCOPY;  Surgeon: Inda Castle, MD;  Location: [facility];  Service: Endoscopy;  Laterality: N/A;  . ESOPHAGOGASTRODUODENOSCOPY N/A 08/28/2013   Procedure: ESOPHAGOGASTRODUODENOSCOPY (EGD);  Surgeon: Gayland Curry, MD;  Location: [facility];  Service: General;  Laterality: N/A;  . ESOPHAGOGASTRODUODENOSCOPY N/A 04/03/2014   Procedure: ESOPHAGOGASTRODUODENOSCOPY (EGD);  Surgeon: Inda Castle, MD;  Location: [facility];  Service: Endoscopy;  Laterality: N/A;  . GASTROSTOMY N/A 08/28/2013   Procedure: GASTROSTOMY;  Surgeon: Rolm Bookbinder, MD;  Location: [facility];  Service: General;  Laterality: N/A;  . LAPAROTOMY N/A 08/28/2013   Procedure: EXPLORATORY LAPAROTOMY;  Surgeon: Rolm Bookbinder, MD;  Location: [place];  Service: General;  Laterality: N/A;  . LOBECTOMY Right 1986   1/3 removed   . LUNG SURGERY  1986   presumed from RLL. Segmentectomy. Benign Nodule.   [name] Kitchen MAXILLARY ANTROSTOMY  09/15/2011   Procedure: MAXILLARY ANTROSTOMY;  Surgeon: Ascencion Dike, MD;  [facility] OR;  Service: ENT;  Laterality: Bilateral;  TOTAL MAXILLARY ANTROSTOMY  . MELANOMA EXCISION Left    under left eye  . REPAIR OF PERFORATED ULCER N/A 08/28/2013   Procedure: DUODENAL ULCER PATCH;  Surgeon: Rolm Bookbinder, MD;  Location: [place];  Service: General;  Laterality: N/A;  . SEPTOPLASTY  09/15/11  . SEPTOPLASTY  09/15/2011   Procedure: SEPTOPLASTY;  Surgeon: Ascencion Dike, MD;  Location: [place];  Service: ENT;  Laterality: Bilateral;  . SINUS ENDO W/FUSION  sch. 09/15/2011   Procedure: ENDOSCOPIC SINUS  SURGERY WITH FUSION NAVIGATION;  Surgeon: Ascencion Dike, MD;  Location: [place];  Service: ENT;  Laterality: Bilateral;  bilateral maxillary antrostomy, bilateral endoscopic ethmoidectomy, bilateral sphenoidectomy, bilateral frontal recess exploration  . SINUS ENDO W/FUSION  09/15/2011   Procedure: ENDOSCOPIC SINUS SURGERY WITH FUSION NAVIGATION;  Surgeon: Ascencion Dike, MD;  Location: [place];  Service: ENT;  Laterality: Bilateral;  . SINUS EXPLORATION  09/15/2011   Procedure: SINUS EXPLORATION;  Surgeon: Ascencion Dike, MD;  Location: [facility];  Service: ENT;  Laterality: Bilateral;  FRONTAL RECESS EXPLORATION  . SPHENOIDECTOMY  09/15/2011   Procedure: Coralee Pesa;  Surgeon: Ascencion Dike, MD;  Location: [facility];  Service: ENT;  Laterality: Bilateral;  . URETEROSCOPY  1980's     reports that he quit smoking about 5 years ago. His smoking use included cigarettes. He has a 60.00 pack-year smoking history. he has never used smokeless tobacco. He reports that he drinks alcohol. He reports that he does not use drugs.  Allergies  Allergen Reactions  . Mometasone Furoate Other (See Comments)    Nasonex: CAUSED RED,ITCHY EYES; "lost my eyelashes"   .  Naproxen Sodium Nausea And Vomiting  . Nsaids Other (See Comments)    Stomach ulcers  . Anoro Ellipta [Umeclidinium-Vilanterol] Other (See Comments)    Double vision  . Aspirin Hives and Nausea And Vomiting     Chills, sweats  . Bevespi Aerosphere [Glycopyrrolate-Formoterol] Hives  . Pulmicort [Budesonide] Hives  . Breo Ellipta [Fluticasone Furoate-Vilanterol] Hives  . Dexilant [Dexlansoprazole] Diarrhea    Cramps   . Omnaris [Ciclesonide] Other (See Comments)    Nose bleed   . Spiriva Handihaler [Tiotropium Bromide Monohydrate] Other (See Comments)    Hiccups     Family History  Problem Relation Age of Onset  . Asthma Father   . Heart disease Mother        MI age 66  . Heart attack Mother   . Prostate cancer Brother   . Prostate cancer  Brother   . Prostate cancer Brother   . Stroke Sister   . Anesthesia problems Neg Hx   . Colon cancer Neg Hx   . Esophageal cancer Neg Hx   . Rectal cancer Neg Hx   . Stomach cancer Neg Hx     Prior to Admission medications   Medication Sig Start Date End Date Taking? Authorizing Provider  albuterol (PROVENTIL HFA;VENTOLIN HFA) 108 (90 Base) MCG/ACT inhaler Inhale 2 puffs into the lungs every 6 (six) hours as needed for wheezing or shortness of breath. 10/08/16   Brand Males, MD  albuterol (PROVENTIL) (2.5 MG/3ML) 0.083% nebulizer solution Take 2.5 mg by nebulization 4 (four) times daily.    [provider]  atorvastatin (LIPITOR) 10 MG tablet Take 10 mg by mouth daily at 6 PM.     [provider]  azithromycin (ZITHROMAX) 250 MG tablet Take 2 on day one then 1 daily x 4 days 06/23/17   Tanda Rockers, MD  budesonide-formoterol [facility]) 160-4.5 MCG/ACT inhaler Inhale 2 puffs into the lungs 2 (two) times daily. 06/23/17   Tanda Rockers, MD  budesonide-formoterol (SYMBICORT) 160-4.5 MCG/ACT inhaler Inhale 2 puffs into the lungs 2 (two) times daily. 07/18/17   Brand Males, MD  ELIQUIS 5 MG TABS tablet TAKE 1 TABLET(5 MG) BY MOUTH TWICE DAILY 02/02/17   Brand Males, MD  esomeprazole (NEXIUM) 40 MG capsule Take 1 capsule (40 mg total) by mouth daily. 05/02/15   Mauri Pole, MD  ferrous sulfate 220 (44 Fe) MG/5ML solution Take 220 mg by mouth every other day.    [provider]  fluocinonide cream (LIDEX) 7.34 % Apply 1 application topically daily as needed (rash). To infected area.    [provider]  fluticasone (FLONASE) 50 MCG/ACT nasal spray Place 2 sprays into the nose at bedtime. Each nostril once a day.    [provider]  furosemide (LASIX) 40 MG tablet TAKE 1 TABLET(40 MG) BY MOUTH DAILY 06/09/17   Burnell Blanks, MD  metoprolol tartrate (LOPRESSOR) 25 MG tablet Take 0.5 tablets (12.5 mg total) by mouth 2 (two)  times daily. 06/09/17   Burnell Blanks, MD  nitroGLYCERIN (NITROSTAT) 0.4 MG SL tablet Place 1 tablet (0.4 mg total) under the tongue every 5 (five) minutes as needed for chest pain. 05/03/16   Burnell Blanks, MD  predniSONE (DELTASONE) 10 MG tablet Take  4 each am x 2 days,   2 each am x 2 days,  1 each am x 2 days and stop 06/23/17   Tanda Rockers, MD  tamsulosin (FLOMAX) 0.4 MG CAPS capsule Take 0.4  mg by mouth daily. 11/06/14   [provider]    Physical Exam: Vitals:   07/24/17 2037 07/24/17 2044 07/24/17 2100 07/24/17 2130  BP:   (!) 108/57 (!) 96/58  Pulse:   67 71  Resp:   14 12  Temp:      TempSrc:      SpO2:  96% 100% 98%  Weight: 74.8 kg (165 lb)     Height: 5\' 8"  (1.727 m)       Constitutional: NAD, calm, comfortable Eyes: PERRL, lids and conjunctivae normal ENMT: Mucous membranes are mildly dry. Posterior pharynx clear of any exudate or lesions. Neck: normal, supple, no masses, no thyromegaly Respiratory: Decreased breath sounds and wheezing bilaterally, no rhonchi or crackles. Normal respiratory effort. No accessory muscle use.  Cardiovascular: Regular rate and rhythm, no murmurs / rubs / gallops. No extremity edema. 2+ pedal pulses. No carotid bruits.  Abdomen: Soft, no tenderness, no masses palpated. No hepatosplenomegaly. Bowel sounds positive.  Musculoskeletal: no clubbing / cyanosis.  Skin: Multiple hyperpigmented lesions on upper trunk. Hyperkeratosis on right frontotemporal area. Neurologic: CN 2-12 grossly intact. Sensation intact, DTR normal. Strength 5/5 in all 4.  Psychiatric: Normal judgment and insight. Alert and oriented x 4. Normal mood.    Labs on Admission: I have personally reviewed following labs and imaging studies  CBC: Recent Labs  Lab 07/24/17 2039  WBC 8.5  NEUTROABS 4.2  HGB 15.5  HCT 47.1  MCV 92.4  PLT 782   Basic Metabolic Panel: Recent Labs  Lab 07/24/17 2039  NA 138  K 4.1  CL 102  CO2 23    GLUCOSE 111*  BUN 32*  CREATININE 1.63*  CALCIUM 9.3   GFR: Estimated Creatinine Clearance: 33.8 mL/min (A) (by C-G formula based on SCr of 1.63 mg/dL (H)). Liver Function Tests: No results for input(s): AST, ALT, ALKPHOS, BILITOT, PROT, ALBUMIN in the last 168 hours. No results for input(s): LIPASE, AMYLASE in the last 168 hours. No results for input(s): AMMONIA in the last 168 hours. Coagulation Profile: No results for input(s): INR, PROTIME in the last 168 hours. Cardiac Enzymes: Recent Labs  Lab 07/24/17 2039  TROPONINI <0.03   BNP (last 3 results) No results for input(s): PROBNP in the last 8760 hours. HbA1C: No results for input(s): HGBA1C in the last 72 hours. CBG: No results for input(s): GLUCAP in the last 168 hours. Lipid Profile: No results for input(s): CHOL, HDL, LDLCALC, TRIG, CHOLHDL, LDLDIRECT in the last 72 hours. Thyroid Function Tests: No results for input(s): TSH, T4TOTAL, FREET4, T3FREE, THYROIDAB in the last 72 hours. Anemia Panel: No results for input(s): VITAMINB12, FOLATE, FERRITIN, TIBC, IRON, RETICCTPCT in the last 72 hours. Urine analysis:    Component Value Date/Time   COLORURINE YELLOW 11/02/2013 0001   APPEARANCEUR CLEAR 11/02/2013 0001   LABSPEC 1.014 11/02/2013 0001   PHURINE 6.5 11/02/2013 0001   GLUCOSEU NEG 11/02/2013 0001   HGBUR MOD (A) 11/02/2013 0001   BILIRUBINUR NEG 11/02/2013 0001   KETONESUR NEG 11/02/2013 0001   PROTEINUR 30 (A) 11/02/2013 0001   UROBILINOGEN 1 11/02/2013 0001   NITRITE NEG 11/02/2013 0001   LEUKOCYTESUR NEG 11/02/2013 0001    Radiological Exams on Admission: Dg Chest Port 1 View  Result Date: 07/24/2017 CLINICAL DATA:  Shortness of Breath EXAM: PORTABLE CHEST 1 VIEW COMPARISON:  10/24/2014 FINDINGS: There is hyperinflation of the lungs compatible with COPD. Heart is borderline in size. No confluent airspace opacities or effusions. No acute bony abnormality.  IMPRESSION: COPD/chronic changes.  Borderline  heart size.  No active disease. Electronically Signed   By: Rolm Baptise M.D.   On: 07/24/2017 20:54    10/27/2016 Echocardiogram complete. ------------------------------------------------------------------- LV EF: 45% -   50%  ------------------------------------------------------------------- Indications:      CHF (I50.9).  ------------------------------------------------------------------- History:   PMH:   Atrial fibrillation.  Coronary artery disease. Congestive heart failure.  Chronic obstructive pulmonary disease. PMH:   Myocardial infarction.  Risk factors:  PVC&'s, Asthma, Deep Vein Thrombosis and Pulmonary Embolism (2013), Obstructive Sleep Apnea. Family history of coronary artery disease. Former tobacco use. Hypertension. Diabetes mellitus. Dyslipidemia.  ------------------------------------------------------------------- Study Conclusions  - Left ventricle: Septal and inferior basal hypokinesis. The cavity   size was normal. Systolic function was mildly reduced. The   estimated ejection fraction was in the range of 45% to 50%. Wall   motion was normal; there were no regional wall motion   abnormalities. Doppler parameters are consistent with abnormal   left ventricular relaxation (grade 1 diastolic dysfunction). - Atrial septum: No defect or patent foramen ovale was identified.  EKG: Independently reviewed.  Vent. rate 69 BPM PR interval * ms QRS duration 89 ms QT/QTc 402/431 ms P-R-T axes 84 90 51 Sinus rhythm Borderline right axis deviation Probable anteroseptal infarct, old Baseline wander Artifact  Assessment/Plan Principal Problem:   COPD exacerbation (HCC) Observation/telemetry. Continue supplemental oxygen. Scheduled and as needed bronchodilators. Solu-Medrol 40 mg IVP every 6 hours. Monitor CBGs closely while on steroids. BiPAP at bedtime.  Active Problems:   Type 2 diabetes mellitus (HCC) Last hemoglobin A1c on the chart 6.1% in April  2015. Carbohydrate modified diet. EEG monitoring with regular insulin sliding scale while on steroids.    OSA (obstructive sleep apnea) BiPAP at bedtime while in the hospital.    Chronic pulmonary embolism (HCC) Continue apixaban 5 mg p.o. twice daily.    DVT (deep venous thrombosis) (HCC) Continue apixaban.    CAD (coronary artery disease) asymptomatic at this time. Continue atorvastatin, apixaban and beta-blocker.    Chronic combined systolic and diastolic congestive heart failure (HCC) No signs of decompensation at this time. Continue furosemide and beta-blocker.    Essential hypertension Continue metoprolol 12.5 mg p.o. twice daily. Also on furosemide 40 mg p.o. daily. Monitor your blood pressure, heart rate, renal function and electrolytes.    Atrial fibrillation (HCC) CHA?DS?-VASc Score of at least 5. Continue apixaban. Continue metoprolol for rate control.    Mixed hyperlipidemia Continue atorvastatin 10 mg p.o. daily. Monitor LFTs as needed. Fasting lipid profile follow-up with primary care provider.    G E R D Protonix 40 mg p.o. Daily.    CKD (chronic kidney disease), stage III ([place]) Around his most recent baseline. Monitor renal function and electrolytes.   DVT prophylaxis: On apixaban. Code Status: Full code. Family Communication: His wife was present in the ED room. Disposition Plan: 24-48-hour observation for COPD exacerbation treatment. Consults called:  Admission status: Observation/telemetry.   Reubin Milan MD [facility] Pager [phone].  If 7PM-7AM, please contact night-coverage [email or web address] Password [facility]  07/24/2017, 10:12 PM

## 2017-07-25 ENCOUNTER — Encounter (HOSPITAL_COMMUNITY): Payer: Self-pay

## 2017-07-25 DIAGNOSIS — I130 Hypertensive heart and chronic kidney disease with heart failure and stage 1 through stage 4 chronic kidney disease, or unspecified chronic kidney disease: Secondary | ICD-10-CM | POA: Diagnosis present

## 2017-07-25 DIAGNOSIS — E1122 Type 2 diabetes mellitus with diabetic chronic kidney disease: Secondary | ICD-10-CM

## 2017-07-25 DIAGNOSIS — N189 Chronic kidney disease, unspecified: Secondary | ICD-10-CM | POA: Diagnosis not present

## 2017-07-25 DIAGNOSIS — E119 Type 2 diabetes mellitus without complications: Secondary | ICD-10-CM

## 2017-07-25 DIAGNOSIS — J441 Chronic obstructive pulmonary disease with (acute) exacerbation: Secondary | ICD-10-CM | POA: Diagnosis not present

## 2017-07-25 DIAGNOSIS — G252 Other specified forms of tremor: Secondary | ICD-10-CM | POA: Diagnosis present

## 2017-07-25 DIAGNOSIS — Z8546 Personal history of malignant neoplasm of prostate: Secondary | ICD-10-CM | POA: Diagnosis not present

## 2017-07-25 DIAGNOSIS — I482 Chronic atrial fibrillation: Secondary | ICD-10-CM | POA: Diagnosis not present

## 2017-07-25 DIAGNOSIS — Z86718 Personal history of other venous thrombosis and embolism: Secondary | ICD-10-CM | POA: Diagnosis not present

## 2017-07-25 DIAGNOSIS — Z86711 Personal history of pulmonary embolism: Secondary | ICD-10-CM | POA: Diagnosis not present

## 2017-07-25 DIAGNOSIS — Z7901 Long term (current) use of anticoagulants: Secondary | ICD-10-CM | POA: Diagnosis not present

## 2017-07-25 DIAGNOSIS — Z888 Allergy status to other drugs, medicaments and biological substances status: Secondary | ICD-10-CM | POA: Diagnosis not present

## 2017-07-25 DIAGNOSIS — I4891 Unspecified atrial fibrillation: Secondary | ICD-10-CM | POA: Diagnosis not present

## 2017-07-25 DIAGNOSIS — Z7951 Long term (current) use of inhaled steroids: Secondary | ICD-10-CM | POA: Diagnosis not present

## 2017-07-25 DIAGNOSIS — G4733 Obstructive sleep apnea (adult) (pediatric): Secondary | ICD-10-CM | POA: Diagnosis not present

## 2017-07-25 DIAGNOSIS — I824Y2 Acute embolism and thrombosis of unspecified deep veins of left proximal lower extremity: Secondary | ICD-10-CM

## 2017-07-25 DIAGNOSIS — N400 Enlarged prostate without lower urinary tract symptoms: Secondary | ICD-10-CM | POA: Diagnosis not present

## 2017-07-25 DIAGNOSIS — I252 Old myocardial infarction: Secondary | ICD-10-CM | POA: Diagnosis not present

## 2017-07-25 DIAGNOSIS — Z886 Allergy status to analgesic agent status: Secondary | ICD-10-CM | POA: Diagnosis not present

## 2017-07-25 DIAGNOSIS — K219 Gastro-esophageal reflux disease without esophagitis: Secondary | ICD-10-CM | POA: Diagnosis not present

## 2017-07-25 DIAGNOSIS — J9601 Acute respiratory failure with hypoxia: Secondary | ICD-10-CM | POA: Diagnosis not present

## 2017-07-25 DIAGNOSIS — I2510 Atherosclerotic heart disease of native coronary artery without angina pectoris: Secondary | ICD-10-CM | POA: Diagnosis not present

## 2017-07-25 DIAGNOSIS — Z79899 Other long term (current) drug therapy: Secondary | ICD-10-CM | POA: Diagnosis not present

## 2017-07-25 DIAGNOSIS — Z87891 Personal history of nicotine dependence: Secondary | ICD-10-CM | POA: Diagnosis not present

## 2017-07-25 DIAGNOSIS — N183 Chronic kidney disease, stage 3 (moderate): Secondary | ICD-10-CM | POA: Diagnosis not present

## 2017-07-25 DIAGNOSIS — E782 Mixed hyperlipidemia: Secondary | ICD-10-CM | POA: Diagnosis present

## 2017-07-25 DIAGNOSIS — I5042 Chronic combined systolic (congestive) and diastolic (congestive) heart failure: Secondary | ICD-10-CM | POA: Diagnosis not present

## 2017-07-25 DIAGNOSIS — I10 Essential (primary) hypertension: Secondary | ICD-10-CM | POA: Diagnosis not present

## 2017-07-25 DIAGNOSIS — Z9989 Dependence on other enabling machines and devices: Secondary | ICD-10-CM | POA: Diagnosis not present

## 2017-07-25 DIAGNOSIS — I2782 Chronic pulmonary embolism: Secondary | ICD-10-CM | POA: Diagnosis not present

## 2017-07-25 LAB — BASIC METABOLIC PANEL
Anion gap: 12 (ref 5–15)
BUN: 31 mg/dL — AB (ref 6–20)
CALCIUM: 9 mg/dL (ref 8.9–10.3)
CO2: 22 mmol/L (ref 22–32)
CREATININE: 1.53 mg/dL — AB (ref 0.61–1.24)
Chloride: 101 mmol/L (ref 101–111)
GFR calc non Af Amer: 41 mL/min — ABNORMAL LOW (ref >60)
GFR, EST AFRICAN AMERICAN: 47 mL/min — AB (ref >60)
Glucose, Bld: 192 mg/dL — ABNORMAL HIGH (ref 65–99)
Potassium: 4.3 mmol/L (ref 3.5–5.1)
SODIUM: 135 mmol/L (ref 135–145)

## 2017-07-25 LAB — GLUCOSE, CAPILLARY
GLUCOSE-CAPILLARY: 182 mg/dL — AB (ref 65–99)
GLUCOSE-CAPILLARY: 217 mg/dL — AB (ref 65–99)
Glucose-Capillary: 164 mg/dL — ABNORMAL HIGH (ref 65–99)
Glucose-Capillary: 103 mg/dL — ABNORMAL HIGH (ref 65–99)

## 2017-07-25 LAB — TROPONIN I
Troponin I: <0.03 ng/mL (ref <0.03)
Troponin I: <0.03 ng/mL (ref <0.03)

## 2017-07-25 MED ORDER — METHYLPREDNISOLONE SODIUM SUCC 125 MG IJ SOLR
60.0000 mg | Freq: Three times a day (TID) | INTRAMUSCULAR | Status: AC
  Administered 2017-07-25 – 2017-07-28 (×9): 60 mg via INTRAVENOUS
  Filled 2017-07-25 (×9): qty 2

## 2017-07-25 MED ORDER — INSULIN ASPART 100 UNIT/ML ~~LOC~~ SOLN
0.0000 [IU] | Freq: Three times a day (TID) | SUBCUTANEOUS | Status: DC
  Administered 2017-07-25 (×2): 3 [IU] via SUBCUTANEOUS
  Administered 2017-07-26: 2 [IU] via SUBCUTANEOUS
  Administered 2017-07-26: 5 [IU] via SUBCUTANEOUS
  Administered 2017-07-26 – 2017-07-28 (×5): 2 [IU] via SUBCUTANEOUS
  Administered 2017-07-28: 3 [IU] via SUBCUTANEOUS
  Administered 2017-07-28: 2 [IU] via SUBCUTANEOUS

## 2017-07-25 MED ORDER — IPRATROPIUM-ALBUTEROL 0.5-2.5 (3) MG/3ML IN SOLN
3.0000 mL | Freq: Four times a day (QID) | RESPIRATORY_TRACT | Status: DC
  Administered 2017-07-25 – 2017-07-29 (×15): 3 mL via RESPIRATORY_TRACT
  Filled 2017-07-25 (×15): qty 3

## 2017-07-25 MED ORDER — ORAL CARE MOUTH RINSE
15.0000 mL | Freq: Two times a day (BID) | OROMUCOSAL | Status: DC
  Administered 2017-07-26 (×2): 15 mL via OROMUCOSAL

## 2017-07-25 MED ORDER — CHLORHEXIDINE GLUCONATE 0.12 % MT SOLN
15.0000 mL | Freq: Two times a day (BID) | OROMUCOSAL | Status: DC
  Administered 2017-07-25 – 2017-07-29 (×9): 15 mL via OROMUCOSAL
  Filled 2017-07-25 (×9): qty 15

## 2017-07-25 MED ORDER — FUROSEMIDE 40 MG PO TABS
40.0000 mg | ORAL_TABLET | Freq: Every day | ORAL | Status: DC
Start: 2017-07-25 — End: 2017-07-29
  Administered 2017-07-25 – 2017-07-29 (×5): 40 mg via ORAL
  Filled 2017-07-25 (×6): qty 1

## 2017-07-25 MED ORDER — ALBUTEROL SULFATE (2.5 MG/3ML) 0.083% IN NEBU
2.5000 mg | INHALATION_SOLUTION | RESPIRATORY_TRACT | Status: DC | PRN
  Administered 2017-07-25 – 2017-07-28 (×2): 2.5 mg via RESPIRATORY_TRACT
  Filled 2017-07-25 (×2): qty 3

## 2017-07-25 MED ORDER — APIXABAN 5 MG PO TABS
5.0000 mg | ORAL_TABLET | Freq: Two times a day (BID) | ORAL | Status: DC
  Administered 2017-07-25: 5 mg via ORAL
  Filled 2017-07-25: qty 1

## 2017-07-25 MED ORDER — ALBUTEROL SULFATE (2.5 MG/3ML) 0.083% IN NEBU
2.5000 mg | INHALATION_SOLUTION | Freq: Four times a day (QID) | RESPIRATORY_TRACT | Status: DC
  Administered 2017-07-25 (×3): 2.5 mg via RESPIRATORY_TRACT
  Filled 2017-07-25 (×3): qty 3

## 2017-07-25 MED ORDER — PREDNISONE 20 MG PO TABS: 40.0000 mg | ORAL_TABLET | Freq: Every day | ORAL | Status: DC

## 2017-07-25 MED ORDER — METHYLPREDNISOLONE SODIUM SUCC 40 MG IJ SOLR
40.0000 mg | Freq: Four times a day (QID) | INTRAMUSCULAR | Status: DC
  Administered 2017-07-25 (×3): 40 mg via INTRAVENOUS
  Filled 2017-07-25 (×3): qty 1

## 2017-07-25 MED ORDER — APIXABAN 2.5 MG PO TABS
2.5000 mg | ORAL_TABLET | Freq: Two times a day (BID) | ORAL | Status: DC
  Administered 2017-07-25 – 2017-07-26 (×3): 2.5 mg via ORAL
  Filled 2017-07-25 (×3): qty 1

## 2017-07-25 MED ORDER — DOXYCYCLINE HYCLATE 100 MG PO TABS
100.0000 mg | ORAL_TABLET | Freq: Two times a day (BID) | ORAL | Status: DC
  Administered 2017-07-25 – 2017-07-29 (×8): 100 mg via ORAL
  Filled 2017-07-25 (×8): qty 1

## 2017-07-25 MED ORDER — BUDESONIDE 0.5 MG/2ML IN SUSP: 0.5000 mg | Freq: Two times a day (BID) | RESPIRATORY_TRACT | Status: DC

## 2017-07-25 MED ORDER — GUAIFENESIN ER 600 MG PO TB12
600.0000 mg | ORAL_TABLET | Freq: Two times a day (BID) | ORAL | Status: DC
  Administered 2017-07-25 – 2017-07-29 (×8): 600 mg via ORAL
  Filled 2017-07-25 (×8): qty 1

## 2017-07-25 MED ORDER — TAMSULOSIN HCL 0.4 MG PO CAPS
0.4000 mg | ORAL_CAPSULE | Freq: Every day | ORAL | Status: DC
  Administered 2017-07-26 – 2017-07-28 (×3): 0.4 mg via ORAL
  Filled 2017-07-25 (×4): qty 1

## 2017-07-25 MED ORDER — FERROUS SULFATE 300 (60 FE) MG/5ML PO SYRP
300.0000 mg | ORAL_SOLUTION | ORAL | Status: DC
  Administered 2017-07-25 – 2017-07-26 (×2): 300 mg via ORAL
  Filled 2017-07-25 (×2): qty 5

## 2017-07-25 MED ORDER — PANTOPRAZOLE SODIUM 40 MG PO TBEC
40.0000 mg | DELAYED_RELEASE_TABLET | Freq: Every day | ORAL | Status: DC
  Administered 2017-07-25 – 2017-07-29 (×5): 40 mg via ORAL
  Filled 2017-07-25 (×4): qty 1

## 2017-07-25 MED ORDER — FLUTICASONE PROPIONATE 50 MCG/ACT NA SUSP
1.0000 | Freq: Every day | NASAL | Status: DC
  Administered 2017-07-25 – 2017-07-29 (×5): 1 via NASAL
  Filled 2017-07-25: qty 16

## 2017-07-25 MED ORDER — ATORVASTATIN CALCIUM 10 MG PO TABS
10.0000 mg | ORAL_TABLET | Freq: Every day | ORAL | Status: DC
  Administered 2017-07-25 – 2017-07-28 (×4): 10 mg via ORAL
  Filled 2017-07-25 (×4): qty 1

## 2017-07-25 MED ORDER — METOPROLOL TARTRATE 25 MG PO TABS
12.5000 mg | ORAL_TABLET | Freq: Two times a day (BID) | ORAL | Status: DC
  Administered 2017-07-25 – 2017-07-29 (×9): 12.5 mg via ORAL
  Filled 2017-07-25 (×10): qty 1

## 2017-07-25 MED ORDER — ENOXAPARIN SODIUM 40 MG/0.4ML ~~LOC~~ SOLN: 40.0000 mg | SUBCUTANEOUS | Status: DC

## 2017-07-25 MED ORDER — NITROGLYCERIN 0.4 MG SL SUBL: 0.4000 mg | SUBLINGUAL_TABLET | SUBLINGUAL | Status: DC | PRN

## 2017-07-25 MED ORDER — FERROUS SULFATE 220 (44 FE) MG/5ML PO ELIX
220.0000 mg | ORAL_SOLUTION | ORAL | Status: DC
  Filled 2017-07-25: qty 5

## 2017-07-25 NOTE — Progress Notes (Signed)
Initial Nutrition Assessment  DOCUMENTATION CODES:      INTERVENTION:  Ensure Enlive po BID, each supplement provides 350 kcal and 20 grams of protein   Heart Healthy/CHO modified diet   NUTRITION DIAGNOSIS:   Inadequate oral intake related to acute illness(COPD exacerbation) as evidenced by percent weight loss, per patient/family report.   GOAL:   Patient will meet greater than or equal to 90% of their needs  MONITOR:   PO intake, Weight trends, Labs  REASON FOR ASSESSMENT:   Consult Assessment of nutrition requirement/status, COPD Protocol  ASSESSMENT: Jon White is an 84 yo male with hx of CHF, GERD, CAD, MI, Prostate Cancer, HTN. He presents with COPD exacerbation. Lives with his spouse of 60 years.   The patient says he was eating well up to a week ago. Usually eats a good breakfast and dinner and light lunch. He drinks milk with meals. Independent with feeding and assists with meal preparation at home.   His weight range has been between 160-170 lb the past 1.2 years. Hospital records show a 4 lb loss in the past month which does not trigger clinically as significant. Is likely associated with his decreased intake related to current illness.    BMP Latest Ref Rng & Units 07/25/2017 07/24/2017 10/06/2015  Glucose 65 - 99 mg/dL 192(H) 111(H) 119(H)  BUN 6 - 20 mg/dL 31(H) 32(H) 21(H)  Creatinine 0.61 - 1.24 mg/dL 1.53(H) 1.63(H) 1.58(H)  Sodium 135 - 145 mmol/L 135 138 139  Potassium 3.5 - 5.1 mmol/L 4.3 4.1 4.6  Chloride 101 - 111 mmol/L 101 102 100(L)  CO2 22 - 32 mmol/L 22 23 28   Calcium 8.9 - 10.3 mg/dL 9.0 9.3 9.1     NUTRITION - FOCUSED PHYSICAL EXAM: No acute findings   Diet Order:  Diet heart healthy/carb modified Room service appropriate? Yes; Fluid consistency: Thin  EDUCATION NEEDS:  Education needs have been addressed Skin:     Last BM:  2/24  Height:   Ht Readings from Last 1 Encounters:  07/25/17 5\' 8"  (1.727 m)    Weight:   Wt Readings  from Last 1 Encounters:  07/25/17 160 lb (72.6 kg)    Ideal Body Weight:  64 kg  BMI:  Body mass index is 24.33 kg/m.  Estimated Nutritional Needs:   Kcal:  1800-2000  Protein:  84-88 gr  Fluid:  1.8-2.0 liters daily  Colman Cater MS,RD,CSG,LDN Office: [phone] Pager: [phone]

## 2017-07-25 NOTE — Care Management Obs Status (Signed)
[place] NOTIFICATION   Patient Details  Name: Jon White MRN: [id number] Date of Birth: [birth year]   Medicare Observation Status Notification Given:  Yes    Nonie Lochner, Chauncey Reading, RN 07/25/2017, 11:44 AM

## 2017-07-25 NOTE — Progress Notes (Signed)
Pt given Solumedrol scheduled. Pt c/o shortness of breath. Pt is having some labored breathing and expiratory wheezes heard upon entrance into room and upon auscultation. Pt has albuterol q2h Prn ordered, RRT Maudie Mercury made aware by this nurse. O2 on 2L is 97%. Will continue to monitor pt.  This RN never heard back from Tylene Fantasia NP about pts inhaler. Will paged Dr. Olevia Bowens.

## 2017-07-25 NOTE — Care Management Note (Signed)
Case Management Note  Patient Details  Name: KAPONO LUHN MRN: [id number] Date of Birth: 01-04-1933  Subjective/Objective: Adm with COPD exacerbation. From home with wife. Ind with ADL's, walks with cane. Has PCP. Still drives. Has neb machine at home. Acutely on oxygen.                 Action/Plan: DC home. Will need to wean to room air or have home O2 assessment.  Expected Discharge Date:    unk              Expected Discharge Plan:     In-House Referral:     Discharge planning Services  CM Consult  Post Acute Care Choice:    Choice offered to:     DME Arranged:    DME Agency:     HH Arranged:    HH Agency:     Status of Service:  In process, will continue to follow  If discussed at Long Length of Stay Meetings, dates discussed:    Additional Comments:  Asaiah Delmont Prosch, Chauncey Reading, RN 07/25/2017, 11:45 AM

## 2017-07-25 NOTE — Progress Notes (Signed)
Pt is refusing BIPAP placement for sleep. Pt states he uses CPAP for snoring but doesn't want to use the hospital machine. Pt encouraged to call should he change his mind

## 2017-07-25 NOTE — Progress Notes (Signed)
Pulmicort inhaler discontinued d/t allergy that causes pt to have hives. NP Tylene Fantasia paged to see if she could order Trilogy inhaler per pt. Waiting for orders/call back

## 2017-07-25 NOTE — Progress Notes (Signed)
[facility] PROGRESS NOTE  Jon White QAS:[id number] DOB: [birth year] DOA: 07/24/2017 PCP: Merrilee Seashore, MD  Interim summary and HPI 84 y.o. male with medical history significant of allergic rhinitis, asthma, atrial fibrillation, PUD with history of upper GI bleed, CAD/history of MI, cataract, chronic renal insufficiency stage III, COPD/emphysema, type 2 diabetes, diastolic heart failure, history of DVT and PE, GERD/hiatal hernia, hyperlipidemia, hypertension, OSA on CPAP, prostate CA in remission, urolithiasis, who is coming to the emergency department with complaints of progressively worse shortness of breath, wheezing and persistent cough occasionally producing yellowish sputum for the past 2 weeks.  Admitted for COPD exacerbation with bronchiectasis.  Assessment/Plan: 1-acute COPD exacerbation with bronchiectasis: -Continue IV steroids -Start Pulmicort nebulizer, doxycycline, Mucinex, flutter valve and duo nebs -Patient requiring oxygen supplementation at this moment to assist with SOB sensation; will wean as tolerated.  -Patient currently with fair air movement, diffuse rhonchi, positive expiratory wheezing and unable to speak in full sentences.  Continue inpatient treatment.  2-type 2 diabetes mellitus -Last A1c on our records was 6.1 and according to patient just following diet modification at home. -While inpatient and using steroids will continue sliding scale insulin. -Follow CBGs.  3-history of pulmonary embolism and DVT -Continue chronic use of apixaban.  4-obstructive sleep apnea -Continue BiPAP nightly  5-essential HTN -continue metoprolol and lasix  6-chronic combined systolic HF -stable and compensated -last EF 45-50% -follow daily weights  -continue lasix  -continue b-blocker  7-HLD -continue lipitor  8-CKD stage 3 -stabel and baseline -will follow renal function trend   9-hx of A. Fib -CHADsVASC score >4 -continue metoprolol for rate  control -continue apixaban  10-BPH -resume flomax  Code Status: Full code Family Communication: No family member at bedside. Disposition Plan: Patient remains inpatient, continue IV steroids, initiate Pulmicort nebulizer, continue DuoNeb, start the patient on doxycycline.  Will also start flutter valve.  Continue weaning oxygen supplementation as tolerated.  Consultants:  None   Procedures:  See below for x-ray reports   Antibiotics:  Doxycycline 07-25-17  HPI/Subjective: Reports he still feeling short of breath and having difficulty expectorating secretions; tachypneic, unable to speak in full sentences and requiring 2-3 L of nasal cannula oxygen supplementation.  Objective: Vitals:   07/25/17 1500 07/25/17 1709  BP: 128/61   Pulse: 81   Resp: 19   Temp: 97.8 F (36.6 C)   SpO2: 98% 96%    Intake/Output Summary (Last 24 hours) at 07/25/2017 1746 Last data filed at 07/25/2017 1509 Gross per 24 hour  Intake 1316.13 ml  Output 1410 ml  Net -93.87 ml   Filed Weights   07/24/17 2023 07/24/17 2037 07/25/17 0229  Weight: 74.8 kg (165 lb) 74.8 kg (165 lb) 72.6 kg (160 lb)    Exam:   General: Afebrile, unable to speak in full sentences, short of breath with minimal exertion and requiring 2-3 L nasal cannula oxygen supplementation.  He denies chest pain.  Expressed having difficulty expectorating secretions.  Cardiovascular: S1 and S2, no rubs, no gallops, no murmurs appreciated on exam, no JVD.  Respiratory: Diffuse rhonchi, positive expiratory wheezing, fair air movement, no use of accessory muscles, positive tachypnea with minimal exertion..  Abdomen: Soft, nontender, nondistended, positive bowel sounds.  Musculoskeletal: No edema, no cyanosis, no clubbing.  Data Reviewed: Basic Metabolic Panel: Recent Labs  Lab 07/24/17 2039 07/25/17 0605  NA 138 135  K 4.1 4.3  CL 102 101  CO2 23 22  GLUCOSE 111* 192*  BUN 32* 31*  CREATININE  1.63* 1.53*  CALCIUM 9.3  9.0  MG 1.9  --   PHOS 3.4  --    CBC: Recent Labs  Lab 07/24/17 2039  WBC 8.5  NEUTROABS 4.2  HGB 15.5  HCT 47.1  MCV 92.4  PLT 215   Cardiac Enzymes: Recent Labs  Lab 07/24/17 2039 07/25/17 0000 07/25/17 0605  TROPONINI <0.03 <0.03 <0.03   BNP (last 3 results) Recent Labs    07/24/17 2040  BNP 29.0   CBG: Recent Labs  Lab 07/25/17 0742 07/25/17 1155 07/25/17 1628  GLUCAP 164* 182* 103*    Studies: Dg Chest Port 1 View  Result Date: 07/24/2017 CLINICAL DATA:  Shortness of Breath EXAM: PORTABLE CHEST 1 VIEW COMPARISON:  10/24/2014 FINDINGS: There is hyperinflation of the lungs compatible with COPD. Heart is borderline in size. No confluent airspace opacities or effusions. No acute bony abnormality. IMPRESSION: COPD/chronic changes.  Borderline heart size.  No active disease. Electronically Signed   By: Rolm Baptise M.D.   On: 07/24/2017 20:54    Scheduled Meds: . apixaban  2.5 mg Oral BID  . atorvastatin  10 mg Oral q1800  . budesonide (PULMICORT) nebulizer solution  0.5 mg Nebulization BID  . chlorhexidine  15 mL Mouth Rinse BID  . ferrous sulfate  300 mg Oral QODAY  . fluticasone  1 spray Each Nare Daily  . furosemide  40 mg Oral Daily  . guaiFENesin  600 mg Oral BID  . insulin aspart  0-15 Units Subcutaneous TID WC  . ipratropium-albuterol  3 mL Nebulization QID  . mouth rinse  15 mL Mouth Rinse q12n4p  . methylPREDNISolone (SOLU-MEDROL) injection  60 mg Intravenous Q8H  . metoprolol tartrate  12.5 mg Oral BID  . pantoprazole  40 mg Oral Daily  . tamsulosin  0.4 mg Oral Daily   Continuous Infusions:  Principal Problem:   COPD exacerbation (HCC) Active Problems:   G E R D   OSA (obstructive sleep apnea)   Chronic pulmonary embolism (HCC)   CKD (chronic kidney disease), stage III (HCC)   DVT (deep venous thrombosis) (HCC)   Essential hypertension   Mixed hyperlipidemia   Atrial fibrillation (HCC)   CAD (coronary artery disease)   Chronic  combined systolic and diastolic congestive heart failure ([place])   Type 2 diabetes mellitus ([place])    Time spent: 35 minutes   [facility] Pager [phone]. If 7PM-7AM, please contact night-coverage at [email or web address], password [facility] 07/25/2017, 5:46 PM  LOS: 0 days

## 2017-07-26 DIAGNOSIS — I2782 Chronic pulmonary embolism: Secondary | ICD-10-CM

## 2017-07-26 DIAGNOSIS — K219 Gastro-esophageal reflux disease without esophagitis: Secondary | ICD-10-CM

## 2017-07-26 LAB — GLUCOSE, CAPILLARY
GLUCOSE-CAPILLARY: 155 mg/dL — AB (ref 65–99)
Glucose-Capillary: 150 mg/dL — ABNORMAL HIGH (ref 65–99)
Glucose-Capillary: 223 mg/dL — ABNORMAL HIGH (ref 65–99)
Glucose-Capillary: 141 mg/dL — ABNORMAL HIGH (ref 65–99)

## 2017-07-26 MED ORDER — FERROUS SULFATE 300 (60 FE) MG/5ML PO SYRP
300.0000 mg | ORAL_SOLUTION | ORAL | Status: DC
  Administered 2017-07-28: 300 mg via ORAL
  Filled 2017-07-26 (×3): qty 5

## 2017-07-26 MED ORDER — APIXABAN 5 MG PO TABS
5.0000 mg | ORAL_TABLET | Freq: Two times a day (BID) | ORAL | Status: DC
  Administered 2017-07-26 – 2017-07-29 (×6): 5 mg via ORAL
  Filled 2017-07-26 (×6): qty 1

## 2017-07-26 NOTE — Progress Notes (Signed)
[facility] PROGRESS NOTE  Jon White IBB:[id number] DOB: Sep 18, 1932 DOA: 07/24/2017 PCP: Merrilee Seashore, MD  Interim summary and HPI 84 y.o. male with medical history significant of allergic rhinitis, asthma, atrial fibrillation, PUD with history of upper GI bleed, CAD/history of MI, cataract, chronic renal insufficiency stage III, COPD/emphysema, type 2 diabetes, diastolic heart failure, history of DVT and PE, GERD/hiatal hernia, hyperlipidemia, hypertension, OSA on CPAP, prostate CA in remission, urolithiasis, who is coming to the emergency department with complaints of progressively worse shortness of breath, wheezing and persistent cough occasionally producing yellowish sputum for the past 2 weeks.  Admitted for COPD exacerbation with bronchiectasis.  Assessment/Plan: 1-acute COPD exacerbation with bronchiectasis: -will Continue IV steroids -continue Pulmicort nebulizer, doxycycline, Mucinex, flutter valve and duo nebs -patient feeling better and with some ease in his breathing -still unable to speak in full sentences and using Oxygen supplementation -assess oxygen needs with ambulation and RA.  -follow clinical response   2-type 2 diabetes mellitus -Last A1c on our records was 6.1 and according to patient just following diet modification at home. -While inpatient and using steroids, continue  SSI, follow CBG and adjust hypoglycemic regimen as needed   3-history of pulmonary embolism and DVT -continue apixaban   4-obstructive sleep apnea -continue BIPAP QHS  5-essential HTN -stable and well controlled -continue metoprolol and lasix  6-chronic combined systolic HF -stable and compensated -last EF 45-50% -continue daily weights, follow strict intake/output -continue lasix and B-blocker   7-HLD -continue statins   8-CKD stage 3 -stabel and baseline -follow renal function trend intermittently   9-hx of A. Fib -CHADsVASC score >4 -continue metoprolol  for rate control -continue apixaban  10-BPH -continue flomax.  Code Status: Full code Family Communication: No family member at bedside. Disposition Plan: Patient remains inpatient, continue IV steroids, continue Pulmicort nebulizer, DuoNeb and doxycycline. Encourage to use flutter valve.  Continue weaning oxygen supplementation as tolerated.  Consultants:  None   Procedures:  See below for x-ray reports   Antibiotics:  Doxycycline 07-25-17  HPI/Subjective: No fever, feeling better and expressed ease in his breathing. Still SOB with activity and having difficulty speaking in full sentences.  Objective: Vitals:   07/26/17 1300 07/26/17 1642  BP: 130/82   Pulse: 82   Resp: 17   Temp: 98.2 F (36.8 C)   SpO2: 96% 94%    Intake/Output Summary (Last 24 hours) at 07/26/2017 1927 Last data filed at 07/26/2017 1700 Gross per 24 hour  Intake 720 ml  Output 900 ml  Net -180 ml   Filed Weights   07/24/17 2037 07/25/17 0229 07/26/17 0526  Weight: 74.8 kg (165 lb) 72.6 kg (160 lb) 72.5 kg (159 lb 13.7 oz)    Exam:   General: afebrile, no CP, still SOB and having difficulty speaking in full sentences.  Cardiovascular: S1 and S2, no rubs, no gallops, no murmurs, no JVD  Respiratory: positive rhonchi and exp wheezing, fair air movement, no using accessory muscles.  Abdomen: soft, NT, ND, positive BS  Musculoskeletal: no edema, no cyanosis, no clubbing   Data Reviewed: Basic Metabolic Panel: Recent Labs  Lab 07/24/17 2039 07/25/17 0605  NA 138 135  K 4.1 4.3  CL 102 101  CO2 23 22  GLUCOSE 111* 192*  BUN 32* 31*  CREATININE 1.63* 1.53*  CALCIUM 9.3 9.0  MG 1.9  --   PHOS 3.4  --    CBC: Recent Labs  Lab 07/24/17 2039  WBC 8.5  NEUTROABS 4.2  HGB 15.5  HCT 47.1  MCV 92.4  PLT 215   Cardiac Enzymes: Recent Labs  Lab 07/24/17 2039 07/25/17 0000 07/25/17 0605  TROPONINI <0.03 <0.03 <0.03   BNP (last 3 results) Recent Labs    07/24/17 2040   BNP 29.0   CBG: Recent Labs  Lab 07/25/17 1628 07/25/17 2022 07/26/17 0723 07/26/17 1144 07/26/17 1635  GLUCAP 103* 217* 150* 223* 141*    Studies: Dg Chest Port 1 View  Result Date: 07/24/2017 CLINICAL DATA:  Shortness of Breath EXAM: PORTABLE CHEST 1 VIEW COMPARISON:  10/24/2014 FINDINGS: There is hyperinflation of the lungs compatible with COPD. Heart is borderline in size. No confluent airspace opacities or effusions. No acute bony abnormality. IMPRESSION: COPD/chronic changes.  Borderline heart size.  No active disease. Electronically Signed   By: Rolm Baptise M.D.   On: 07/24/2017 20:54    Scheduled Meds: . apixaban  5 mg Oral BID  . atorvastatin  10 mg Oral q1800  . chlorhexidine  15 mL Mouth Rinse BID  . doxycycline  100 mg Oral Q12H  . [START ON 07/28/2017] ferrous sulfate  300 mg Oral QODAY  . fluticasone  1 spray Each Nare Daily  . furosemide  40 mg Oral Daily  . guaiFENesin  600 mg Oral BID  . insulin aspart  0-15 Units Subcutaneous TID WC  . ipratropium-albuterol  3 mL Nebulization QID  . mouth rinse  15 mL Mouth Rinse q12n4p  . methylPREDNISolone (SOLU-MEDROL) injection  60 mg Intravenous Q8H  . metoprolol tartrate  12.5 mg Oral BID  . pantoprazole  40 mg Oral Daily  . tamsulosin  0.4 mg Oral Daily    Time spent: 30 minutes   [place] Pager [phone]. If 7PM-7AM, please contact night-coverage at [email or web address], password [facility] 07/26/2017, 7:27 PM  LOS: 1 day

## 2017-07-26 NOTE — Progress Notes (Addendum)
Note placed on wrong pt  

## 2017-07-27 DIAGNOSIS — I2510 Atherosclerotic heart disease of native coronary artery without angina pectoris: Secondary | ICD-10-CM

## 2017-07-27 DIAGNOSIS — N189 Chronic kidney disease, unspecified: Secondary | ICD-10-CM

## 2017-07-27 DIAGNOSIS — J9601 Acute respiratory failure with hypoxia: Secondary | ICD-10-CM

## 2017-07-27 DIAGNOSIS — I5042 Chronic combined systolic (congestive) and diastolic (congestive) heart failure: Secondary | ICD-10-CM

## 2017-07-27 DIAGNOSIS — J441 Chronic obstructive pulmonary disease with (acute) exacerbation: Principal | ICD-10-CM

## 2017-07-27 DIAGNOSIS — I10 Essential (primary) hypertension: Secondary | ICD-10-CM

## 2017-07-27 DIAGNOSIS — N183 Chronic kidney disease, stage 3 (moderate): Secondary | ICD-10-CM

## 2017-07-27 LAB — GLUCOSE, CAPILLARY
GLUCOSE-CAPILLARY: 140 mg/dL — AB (ref 65–99)
Glucose-Capillary: 133 mg/dL — ABNORMAL HIGH (ref 65–99)
Glucose-Capillary: 154 mg/dL — ABNORMAL HIGH (ref 65–99)

## 2017-07-27 NOTE — Progress Notes (Signed)
PROGRESS NOTE  Jon White ENI:[id number] DOB: [birth year] DOA: 07/24/2017 PCP: Merrilee Seashore, MD  Brief History:  84 y.o.malewith medical history significant of allergic rhinitis, asthma, atrial fibrillation, PUD with history of upper GI bleed, CAD/history of MI, cataract, chronic renal insufficiency stage III, COPD/emphysema, type 2 diabetes, diastolic heart failure,  recurrent DVT and PE, GERD/hiatal hernia, hyperlipidemia, hypertension, OSA on CPAP, prostate CA in remission, urolithiasis, who is coming to the emergency department with complaints of progressively worse shortness of breath, wheezing and persistent cough occasionally producing yellowish sputum for the past 2 weeks.  Admitted for COPD exacerbation with bronchiectasis.    Assessment/Plan: Acute respiratory failure with hypoxia -Secondary to COPD exacerbation -Presently stable on 3 L -Wean oxygen for saturation greater than 92% -Check amatory pulse ox prior to discharge  acute COPD exacerbation -Continue IV steroids -continue doxycycline, Mucinex, flutter valve and duo nebs -patient feeling better and with some ease in his breathing -cannot use pulmicort due to budesonide allergy -follow clinical response   type 2 diabetes mellitus -08/31/13 A1c on our records was 6.1 and according to patient just following diet modification at home. -While inpatient and using steroids, continue  SSI, follow CBG and adjust hypoglycemic regimen as needed  -check A1C  Recurrent pulmonary embolism and DVT -continue apixaban   obstructive sleep apnea -continue BIPAP QHS  essential HTN -stable and well controlled -continue metoprolol and lasix  chronic combined systolic HF -stable and compensated -10/27/2016 echo EF 45-50%, grade 1 DD, mild TR, trivial MR -continue daily weights, follow strict intake/output -continue lasix and B-blocker   HLD -continue statins   CKD stage 3 -With baseline  creatinine 1.3-1.6 -follow renal function trend intermittently   Coronary artery disease with history of NSTEMI -no angina -continue BB and statin  BPH -continue flomax.  Tremor -mostly intention tremor -outpt referral to movement disorder neurologist    Disposition Plan:   Home in 1-2 days  Family Communication:   Family at bedside  Consultants:  none  Code Status:  FULL  DVT Prophylaxis:  apixaban   Procedures: As Listed in Progress Note Above  Antibiotics: Doxy 2/25>>>    Subjective: Patient states that his breathing is 50% better.  Continues to have nonproductive cough.  Denies any chest pain, nausea, vomiting, diarrhea, abdominal pain, fevers, chills.  Objective: Vitals:   07/27/17 0509 07/27/17 0736 07/27/17 1258 07/27/17 1613  BP: 103/62     Pulse: 81     Resp:      Temp: 97.8 F (36.6 C)     TempSrc: Oral     SpO2: 97% 95% 96% 97%  Weight:      Height:        Intake/Output Summary (Last 24 hours) at 07/27/2017 1754 Last data filed at 07/27/2017 1740 Gross per 24 hour  Intake 360 ml  Output 1400 ml  Net -1040 ml   Weight change:  Exam:   General:  Pt is alert, follows commands appropriately, not in acute distress  HEENT: No icterus, No thrush, No neck mass, [place]/AT  Cardiovascular: RRR, S1/S2, no rubs, no gallops  Respiratory: Bibasilar rales.  Bilateral expiratory wheeze.  Abdomen: Soft/+BS, non tender, non distended, no guarding  Extremities: trace LE edema, No lymphangitis, No petechiae, No rashes, no synovitis   Data Reviewed: I have personally reviewed following labs and imaging studies Basic Metabolic Panel: Recent Labs  Lab 07/24/17 2039 07/25/17 0605  NA 138 135  K  4.1 4.3  CL 102 101  CO2 23 22  GLUCOSE 111* 192*  BUN 32* 31*  CREATININE 1.63* 1.53*  CALCIUM 9.3 9.0  MG 1.9  --   PHOS 3.4  --    Liver Function Tests: No results for input(s): AST, ALT, ALKPHOS, BILITOT, PROT, ALBUMIN in the last 168 hours. No  results for input(s): LIPASE, AMYLASE in the last 168 hours. No results for input(s): AMMONIA in the last 168 hours. Coagulation Profile: No results for input(s): INR, PROTIME in the last 168 hours. CBC: Recent Labs  Lab 07/24/17 2039  WBC 8.5  NEUTROABS 4.2  HGB 15.5  HCT 47.1  MCV 92.4  PLT 215   Cardiac Enzymes: Recent Labs  Lab 07/24/17 2039 07/25/17 0000 07/25/17 0605  TROPONINI <0.03 <0.03 <0.03   BNP: Invalid input(s): POCBNP CBG: Recent Labs  Lab 07/26/17 1144 07/26/17 1635 07/26/17 2301 07/27/17 0818 07/27/17 1718  GLUCAP 223* 141* 155* 133* 140*   HbA1C: No results for input(s): HGBA1C in the last 72 hours. Urine analysis:    Component Value Date/Time   COLORURINE YELLOW 11/02/2013 0001   APPEARANCEUR CLEAR 11/02/2013 0001   LABSPEC 1.014 11/02/2013 0001   PHURINE 6.5 11/02/2013 0001   GLUCOSEU NEG 11/02/2013 0001   HGBUR MOD (A) 11/02/2013 0001   BILIRUBINUR NEG 11/02/2013 0001   KETONESUR NEG 11/02/2013 0001   PROTEINUR 30 (A) 11/02/2013 0001   UROBILINOGEN 1 11/02/2013 0001   NITRITE NEG 11/02/2013 0001   LEUKOCYTESUR NEG 11/02/2013 0001   Sepsis Labs: @LABRCNTIP (procalcitonin:4,lacticidven:4) )No results found for this or any previous visit (from the past 240 hour(s)).   Scheduled Meds: . apixaban  5 mg Oral BID  . atorvastatin  10 mg Oral q1800  . chlorhexidine  15 mL Mouth Rinse BID  . doxycycline  100 mg Oral Q12H  . [START ON 07/28/2017] ferrous sulfate  300 mg Oral QODAY  . fluticasone  1 spray Each Nare Daily  . furosemide  40 mg Oral Daily  . guaiFENesin  600 mg Oral BID  . insulin aspart  0-15 Units Subcutaneous TID WC  . ipratropium-albuterol  3 mL Nebulization QID  . mouth rinse  15 mL Mouth Rinse q12n4p  . methylPREDNISolone (SOLU-MEDROL) injection  60 mg Intravenous Q8H  . metoprolol tartrate  12.5 mg Oral BID  . pantoprazole  40 mg Oral Daily  . tamsulosin  0.4 mg Oral Daily   Continuous  Infusions:  Procedures/Studies: Dg Chest Port 1 View  Result Date: 07/24/2017 CLINICAL DATA:  Shortness of Breath EXAM: PORTABLE CHEST 1 VIEW COMPARISON:  10/24/2014 FINDINGS: There is hyperinflation of the lungs compatible with COPD. Heart is borderline in size. No confluent airspace opacities or effusions. No acute bony abnormality. IMPRESSION: COPD/chronic changes.  Borderline heart size.  No active disease. Electronically Signed   By: Rolm Baptise M.D.   On: 07/24/2017 20:54    Orson Eva, DO  [facility] Pager [phone]  If 7PM-7AM, please contact night-coverage [email or web address] Password [facility] 07/27/2017, 5:54 PM   LOS: 2 days

## 2017-07-27 NOTE — Progress Notes (Signed)
Pts Oxygen saturation @ 2145 97% on 2L O2 per [place]. Per Dr. Arturo Morton notes, try to wean oxygen for sats .92%. Pts O2 turned down to 1L- will continue to monitor pt

## 2017-07-27 NOTE — Progress Notes (Signed)
Patient not using BIPAP at this time at night. No unit in room.

## 2017-07-28 DIAGNOSIS — I4891 Unspecified atrial fibrillation: Secondary | ICD-10-CM

## 2017-07-28 LAB — GLUCOSE, CAPILLARY
GLUCOSE-CAPILLARY: 143 mg/dL — AB (ref 65–99)
GLUCOSE-CAPILLARY: 159 mg/dL — AB (ref 65–99)
GLUCOSE-CAPILLARY: 148 mg/dL — AB (ref 65–99)
Glucose-Capillary: 135 mg/dL — ABNORMAL HIGH (ref 65–99)
Glucose-Capillary: 153 mg/dL — ABNORMAL HIGH (ref 65–99)

## 2017-07-28 MED ORDER — PREDNISONE 20 MG PO TABS
60.0000 mg | ORAL_TABLET | Freq: Every day | ORAL | Status: DC
  Administered 2017-07-29: 60 mg via ORAL
  Filled 2017-07-28: qty 3

## 2017-07-28 NOTE — Progress Notes (Signed)
PROGRESS NOTE  Jon White BDZ:[id number] DOB: [birth year] DOA: 07/24/2017 PCP: Merrilee Seashore, MD  Brief History:  84 y.o.malewith medical history significant of allergic rhinitis, asthma, atrial fibrillation, PUD with history of upper GI bleed, CAD/history of MI, cataract, chronic renal insufficiency stage III, COPD/emphysema, type 2 diabetes, diastolic heart failure,  recurrent DVT and PE, GERD/hiatal hernia, hyperlipidemia, hypertension, OSA on CPAP, prostate CA in remission, urolithiasis, who is coming to the emergency department with complaints of progressively worse shortness of breath, wheezing and persistent cough occasionally producing yellowish sputum for the past 2 weeks.  Admitted for COPD exacerbation with bronchiectasis.    Assessment/Plan: Acute respiratory failure with hypoxia -Secondary to COPD exacerbation -Presently stable on 3 L>>>1L -Wean oxygen for saturation greater than 92% -Check ambulatory pulse ox on RA--no desaturation <88%   acute COPD exacerbation -Continue IV steroids -continue doxycycline, Mucinex, flutter valve and duo nebs -cannot use pulmicort due to budesonide allergy -clinically improving -continued wheeze likely a component of vocal cord dysfunction as confirmed by his ENT  type 2 diabetes mellitus -08/31/13 A1c 6.1 and according to patient just following diet modification at home. -While inpatient and using steroids, continue SSI, follow CBG and adjust hypoglycemic regimen as needed  -check A1C  Recurrent pulmonary embolism and DVT -continue apixaban  obstructive sleep apnea -continue BIPAP QHS  essential HTN -stable and well controlled -continue metoprolol and lasix  chronic combined systolic HF -stable and compensated -10/27/2016 echo EF 45-50%, grade 1 DD, mild TR, trivial MR -continue daily weights, follow strict intake/output -continue lasix and B-blocker  HLD -continue statins  CKD stage  3 -With baseline creatinine 1.3-1.6 -follow renal function trend intermittently  Coronary artery disease with history of NSTEMI -no angina -continue BB and statin  BPH -continue flomax.  Tremor -mostly intention tremor -outpt referral to movement disorder neurologist    Disposition Plan:   Home 3/1 if stable  Family Communication:   Family at bedside  Consultants:  none  Code Status:  FULL  DVT Prophylaxis:  apixaban   Procedures: As Listed in Progress Note Above  Antibiotics: Doxy 2/25>>>     Subjective:   Objective: Vitals:   07/28/17 0750 07/28/17 1225 07/28/17 1316 07/28/17 1556  BP:   120/73   Pulse:   78   Resp:   17   Temp:   97.6 F (36.4 C)   TempSrc:   Oral   SpO2: 93% 97% 96% 95%  Weight:      Height:        Intake/Output Summary (Last 24 hours) at 07/28/2017 1735 Last data filed at 07/28/2017 1317 Gross per 24 hour  Intake 840 ml  Output 2150 ml  Net -1310 ml   Weight change:  Exam:   General:  Pt is alert, follows commands appropriately, not in acute distress  HEENT: No icterus, No thrush, No neck mass, [place]/AT  Cardiovascular: RRR, S1/S2, no rubs, no gallops  Respiratory: CTA bilaterally, no wheezing, no crackles, no rhonchi  Abdomen: Soft/+BS, non tender, non distended, no guarding  Extremities: No edema, No lymphangitis, No petechiae, No rashes, no synovitis   Data Reviewed: I have personally reviewed following labs and imaging studies Basic Metabolic Panel: Recent Labs  Lab 07/24/17 2039 07/25/17 0605  NA 138 135  K 4.1 4.3  CL 102 101  CO2 23 22  GLUCOSE 111* 192*  BUN 32* 31*  CREATININE 1.63* 1.53*  CALCIUM 9.3 9.0  MG  1.9  --   PHOS 3.4  --    Liver Function Tests: No results for input(s): AST, ALT, ALKPHOS, BILITOT, PROT, ALBUMIN in the last 168 hours. No results for input(s): LIPASE, AMYLASE in the last 168 hours. No results for input(s): AMMONIA in the last 168 hours. Coagulation  Profile: No results for input(s): INR, PROTIME in the last 168 hours. CBC: Recent Labs  Lab 07/24/17 2039  WBC 8.5  NEUTROABS 4.2  HGB 15.5  HCT 47.1  MCV 92.4  PLT 215   Cardiac Enzymes: Recent Labs  Lab 07/24/17 2039 07/25/17 0000 07/25/17 0605  TROPONINI <0.03 <0.03 <0.03   BNP: Invalid input(s): POCBNP CBG: Recent Labs  Lab 07/27/17 1718 07/27/17 2143 07/28/17 0804 07/28/17 1117 07/28/17 1649  GLUCAP 140* 154* 135* 159* 148*   HbA1C: No results for input(s): HGBA1C in the last 72 hours. Urine analysis:    Component Value Date/Time   COLORURINE YELLOW 11/02/2013 0001   APPEARANCEUR CLEAR 11/02/2013 0001   LABSPEC 1.014 11/02/2013 0001   PHURINE 6.5 11/02/2013 0001   GLUCOSEU NEG 11/02/2013 0001   HGBUR MOD (A) 11/02/2013 0001   BILIRUBINUR NEG 11/02/2013 0001   KETONESUR NEG 11/02/2013 0001   PROTEINUR 30 (A) 11/02/2013 0001   UROBILINOGEN 1 11/02/2013 0001   NITRITE NEG 11/02/2013 0001   LEUKOCYTESUR NEG 11/02/2013 0001   Sepsis Labs: @LABRCNTIP (procalcitonin:4,lacticidven:4) )No results found for this or any previous visit (from the past 240 hour(s)).   Scheduled Meds: . apixaban  5 mg Oral BID  . atorvastatin  10 mg Oral q1800  . chlorhexidine  15 mL Mouth Rinse BID  . doxycycline  100 mg Oral Q12H  . ferrous sulfate  300 mg Oral QODAY  . fluticasone  1 spray Each Nare Daily  . furosemide  40 mg Oral Daily  . guaiFENesin  600 mg Oral BID  . insulin aspart  0-15 Units Subcutaneous TID WC  . ipratropium-albuterol  3 mL Nebulization QID  . mouth rinse  15 mL Mouth Rinse q12n4p  . metoprolol tartrate  12.5 mg Oral BID  . pantoprazole  40 mg Oral Daily  . tamsulosin  0.4 mg Oral Daily   Continuous Infusions:  Procedures/Studies: Dg Chest Port 1 View  Result Date: 07/24/2017 CLINICAL DATA:  Shortness of Breath EXAM: PORTABLE CHEST 1 VIEW COMPARISON:  10/24/2014 FINDINGS: There is hyperinflation of the lungs compatible with COPD. Heart is  borderline in size. No confluent airspace opacities or effusions. No acute bony abnormality. IMPRESSION: COPD/chronic changes.  Borderline heart size.  No active disease. Electronically Signed   By: Rolm Baptise M.D.   On: 07/24/2017 20:54    Orson Eva, DO  [facility] Pager [phone]  If 7PM-7AM, please contact night-coverage [email or web address] Password [email or web address] 07/28/2017, 5:35 PM   LOS: 3 days

## 2017-07-28 NOTE — Progress Notes (Signed)
Pt called this RN and stated that he couldn't breathe right and he felt like he was pulling more than normal. RN to room -02 sats 96% still on 1 L of O2. Pt having expiratory wheezes heard even without auscultation. This RN called RT who came and gave pt a treatment. Pt stated he felt better and his anxiety had decreased after treatment. Will continue to monitor

## 2017-07-29 DIAGNOSIS — G4733 Obstructive sleep apnea (adult) (pediatric): Secondary | ICD-10-CM

## 2017-07-29 LAB — BASIC METABOLIC PANEL
ANION GAP: 12 (ref 5–15)
BUN: 48 mg/dL — ABNORMAL HIGH (ref 6–20)
CO2: 23 mmol/L (ref 22–32)
Calcium: 8.9 mg/dL (ref 8.9–10.3)
Chloride: 102 mmol/L (ref 101–111)
Creatinine, Ser: 1.43 mg/dL — ABNORMAL HIGH (ref 0.61–1.24)
GFR, EST AFRICAN AMERICAN: 51 mL/min — AB (ref >60)
GFR, EST NON AFRICAN AMERICAN: 44 mL/min — AB (ref >60)
Glucose, Bld: 104 mg/dL — ABNORMAL HIGH (ref 65–99)
POTASSIUM: 4.2 mmol/L (ref 3.5–5.1)
SODIUM: 137 mmol/L (ref 135–145)

## 2017-07-29 LAB — HEMOGLOBIN A1C
HEMOGLOBIN A1C: 6.6 % — AB (ref 4.8–5.6)
MEAN PLASMA GLUCOSE: 142.72 mg/dL

## 2017-07-29 LAB — MAGNESIUM: MAGNESIUM: 1.8 mg/dL (ref 1.7–2.4)

## 2017-07-29 LAB — GLUCOSE, CAPILLARY
GLUCOSE-CAPILLARY: 103 mg/dL — AB (ref 65–99)
Glucose-Capillary: 120 mg/dL — ABNORMAL HIGH (ref 65–99)

## 2017-07-29 MED ORDER — DOXYCYCLINE HYCLATE 100 MG PO TABS: 100.0000 mg | ORAL_TABLET | Freq: Two times a day (BID) | ORAL | 0 refills | Status: DC

## 2017-07-29 MED ORDER — PREDNISONE 10 MG PO TABS: 60.0000 mg | ORAL_TABLET | Freq: Every day | ORAL | 0 refills | Status: DC

## 2017-07-29 MED ORDER — IPRATROPIUM BROMIDE 0.02 % IN SOLN: 0.5000 mg | Freq: Four times a day (QID) | RESPIRATORY_TRACT | 1 refills | Status: DC

## 2017-07-29 NOTE — Progress Notes (Signed)
IV removed, WNL. D/c instructions given to pt. Verbalized understanding. Pt family member at bedside to transport home.

## 2017-07-29 NOTE — Discharge Summary (Signed)
Physician Discharge Summary  Jon White VQQ:[id number] DOB: 1932-06-27 DOA: 07/24/2017  PCP: Merrilee Seashore, MD  Admit date: 07/24/2017 Discharge date: 07/29/2017  Admitted From: Home Disposition:  Home   Recommendations for Outpatient Follow-up:  1. Follow up with PCP in 1-2 weeks 2. Please obtain BMP/CBC in one week   Discharge Condition: Stable CODE STATUS: FULL Diet recommendation: Heart Healthy / Carb Modified    Brief/Interim Summary: 84 y.o.malewith medical history significant of allergic rhinitis, asthma, atrial fibrillation, PUD with history of upper GI bleed, CAD/history of MI, cataract, chronic renal insufficiency stage III, COPD/emphysema, type 2 diabetes, diastolic heart failure,recurrentDVT and PE, GERD/hiatal hernia, hyperlipidemia, hypertension, OSA on CPAP, prostate CA in remission, urolithiasis, who is coming to the emergency department with complaints of progressively worse shortness of breath, wheezing and persistent cough occasionally producing yellowish sputum for the past 2 weeks.  Admitted for COPD exacerbation with bronchiectasis. The patient was started on Pulmicort and bronchodilators with intravenous Solu-Medrol.  The patient experienced good clinical improvement.  He was weaned off oxygen.  Amatory pulse oximetry prior to discharge did not reveal desaturation worse than 88%   Discharge Diagnoses:   Acute respiratory failure with hypoxia -Secondary to COPD exacerbation -Presently stable on 3 L>>>1L -Wean oxygen for saturation greater than 92% -Check ambulatory pulse ox on RA--no desaturation <88%   acute COPD exacerbation -Continue IV steroids>>>home with prednisone taper -continue doxycycline, Mucinex, flutter valve and duo nebs -home with doxy x 2 more days  -cannot use pulmicort due to budesonide allergy -clinically improving -continued wheeze likely a component of vocal cord dysfunction as confirmed by his ENT  type 2  diabetes mellitus -4/3/15A1c 6.1 and according to patient just following diet modification at home. -While inpatient and using steroids, continue SSI, follow CBG and adjust hypoglycemic regimen as needed -07/29/17- A1C--6.6  Recurrentpulmonary embolism and DVT -continue apixaban  obstructive sleep apnea -continue BIPAP QHS  essential HTN -stable and well controlled -continue metoprolol and lasix  chronic combined systolic HF -stable and compensated -10/27/2016 echoEF 45-50%, grade 1 DD, mild TR, trivial MR -continue daily weights, follow strict intake/output -continue lasix and B-blocker  HLD -continue statins  CKD stage 3 -With baseline creatinine 1.3-1.6 -follow renal function trend intermittently  Coronary artery disease with history ofNSTEMI -no angina -continue BB and statin  BPH -continue flomax.  Tremor -mostly intention tremor -outpt referral to movement disorder neurologist      Discharge Instructions  Discharge Instructions    Ambulatory referral to Neurology   Complete by:  As directed    Refer to [facility]--refer to Dr. Diamantina Monks   Diet - low sodium heart healthy   Complete by:  As directed    Increase activity slowly   Complete by:  As directed      Allergies as of 07/29/2017      Reactions   Mometasone Furoate Other (See Comments)   Nasonex: CAUSED RED,ITCHY EYES; "lost my eyelashes"    Naproxen Sodium Nausea And Vomiting   Nsaids Other (See Comments)   Stomach ulcers   Anoro Ellipta [umeclidinium-vilanterol] Other (See Comments)   Double vision   Aspirin Hives, Nausea And Vomiting    Chills, sweats   Bevespi Aerosphere [glycopyrrolate-formoterol] Hives   Pulmicort [budesonide] Hives   Breo Ellipta [fluticasone Furoate-vilanterol] Hives   Dexilant [dexlansoprazole] Diarrhea   Cramps   Omnaris [ciclesonide] Other (See Comments)   Nose bleed    Spiriva Handihaler [tiotropium Bromide Monohydrate] Other (See  Comments)   Hiccups  Medication List    STOP taking these medications   azithromycin 250 MG tablet Commonly known as:  ZITHROMAX     TAKE these medications   albuterol (2.5 MG/3ML) 0.083% nebulizer solution Commonly known as:  PROVENTIL Take 2.5 mg by nebulization 4 (four) times daily.   albuterol 108 (90 Base) MCG/ACT inhaler Commonly known as:  PROVENTIL HFA;VENTOLIN HFA Inhale 2 puffs into the lungs every 6 (six) hours as needed for wheezing or shortness of breath.   atorvastatin 10 MG tablet Commonly known as:  LIPITOR Take 10 mg by mouth daily at 6 PM.   budesonide-formoterol 160-4.5 MCG/ACT inhaler Commonly known as:  SYMBICORT Inhale 2 puffs into the lungs 2 (two) times daily.   doxycycline 100 MG tablet Commonly known as:  VIBRA-TABS Take 1 tablet (100 mg total) by mouth every 12 (twelve) hours.   ELIQUIS 5 MG Tabs tablet Generic drug:  apixaban TAKE 1 TABLET(5 MG) BY MOUTH TWICE DAILY   esomeprazole 40 MG capsule Commonly known as:  NEXIUM Take 1 capsule (40 mg total) by mouth daily.   ferrous sulfate 220 (44 Fe) MG/5ML solution Take 220 mg by mouth every other day.   fluocinonide cream 0.05 % Commonly known as:  LIDEX Apply 1 application topically daily as needed (rash). To infected area.   fluticasone 50 MCG/ACT nasal spray Commonly known as:  FLONASE Place 2 sprays into the nose at bedtime. Each nostril once a day.   furosemide 40 MG tablet Commonly known as:  LASIX TAKE 1 TABLET(40 MG) BY MOUTH DAILY   ipratropium 0.02 % nebulizer solution Commonly known as:  ATROVENT Take 2.5 mLs (0.5 mg total) by nebulization 4 (four) times daily.   metoprolol tartrate 25 MG tablet Commonly known as:  LOPRESSOR Take 0.5 tablets (12.5 mg total) by mouth 2 (two) times daily.   nitroGLYCERIN 0.4 MG SL tablet Commonly known as:  NITROSTAT Place 1 tablet (0.4 mg total) under the tongue every 5 (five) minutes as needed for chest pain.   predniSONE 10 MG  tablet Commonly known as:  DELTASONE Take 6 tablets (60 mg total) by mouth daily with breakfast. And decrease by one tablet daily Start taking on:  07/30/2017 What changed:    how much to take  how to take this  when to take this  additional instructions   tamsulosin 0.4 MG Caps capsule Commonly known as:  FLOMAX Take 0.4 mg by mouth daily.       Allergies  Allergen Reactions  . Mometasone Furoate Other (See Comments)    Nasonex: CAUSED RED,ITCHY EYES; "lost my eyelashes"   . Naproxen Sodium Nausea And Vomiting  . Nsaids Other (See Comments)    Stomach ulcers  . Anoro Ellipta [Umeclidinium-Vilanterol] Other (See Comments)    Double vision  . Aspirin Hives and Nausea And Vomiting     Chills, sweats  . Bevespi Aerosphere [Glycopyrrolate-Formoterol] Hives  . Pulmicort [Budesonide] Hives  . Breo Ellipta [Fluticasone Furoate-Vilanterol] Hives  . Dexilant [Dexlansoprazole] Diarrhea    Cramps   . Omnaris [Ciclesonide] Other (See Comments)    Nose bleed   . Spiriva Handihaler [Tiotropium Bromide Monohydrate] Other (See Comments)    Hiccups     Consultations:  none   Procedures/Studies: Dg Chest Port 1 View  Result Date: 07/24/2017 CLINICAL DATA:  Shortness of Breath EXAM: PORTABLE CHEST 1 VIEW COMPARISON:  10/24/2014 FINDINGS: There is hyperinflation of the lungs compatible with COPD. Heart is borderline in size. No confluent airspace opacities or  effusions. No acute bony abnormality. IMPRESSION: COPD/chronic changes.  Borderline heart size.  No active disease. Electronically Signed   By: Rolm Baptise M.D.   On: 07/24/2017 20:54        Discharge Exam: Vitals:   07/29/17 0602 07/29/17 1117  BP:    Pulse:    Resp:    Temp:    SpO2: 97% 97%   Vitals:   07/28/17 2107 07/29/17 0543 07/29/17 0602 07/29/17 1117  BP: (!) 115/59 130/79    Pulse: 90 65    Resp: 16 20    Temp: 98 F (36.7 C) 97.9 F (36.6 C)    TempSrc: Oral Oral    SpO2:  98% 97% 97%  Weight:       Height:        General: Pt is alert, awake, not in acute distress Cardiovascular: RRR, S1/S2 +, no rubs, no gallops Respiratory: Bibasilar rales.  Bibasilar expiratory wheeze. Abdominal: Soft, NT, ND, bowel sounds + Extremities: trace LE edema, no cyanosis   The results of significant diagnostics from this hospitalization (including imaging, microbiology, ancillary and laboratory) are listed below for reference.    Significant Diagnostic Studies: Dg Chest Port 1 View  Result Date: 07/24/2017 CLINICAL DATA:  Shortness of Breath EXAM: PORTABLE CHEST 1 VIEW COMPARISON:  10/24/2014 FINDINGS: There is hyperinflation of the lungs compatible with COPD. Heart is borderline in size. No confluent airspace opacities or effusions. No acute bony abnormality. IMPRESSION: COPD/chronic changes.  Borderline heart size.  No active disease. Electronically Signed   By: Rolm Baptise M.D.   On: 07/24/2017 20:54     Microbiology: No results found for this or any previous visit (from the past 240 hour(s)).   Labs: Basic Metabolic Panel: Recent Labs  Lab 07/24/17 2039 07/25/17 0605 07/29/17 0517  NA 138 135 137  K 4.1 4.3 4.2  CL 102 101 102  CO2 23 22 23   GLUCOSE 111* 192* 104*  BUN 32* 31* 48*  CREATININE 1.63* 1.53* 1.43*  CALCIUM 9.3 9.0 8.9  MG 1.9  --  1.8  PHOS 3.4  --   --    Liver Function Tests: No results for input(s): AST, ALT, ALKPHOS, BILITOT, PROT, ALBUMIN in the last 168 hours. No results for input(s): LIPASE, AMYLASE in the last 168 hours. No results for input(s): AMMONIA in the last 168 hours. CBC: Recent Labs  Lab 07/24/17 2039  WBC 8.5  NEUTROABS 4.2  HGB 15.5  HCT 47.1  MCV 92.4  PLT 215   Cardiac Enzymes: Recent Labs  Lab 07/24/17 2039 07/25/17 0000 07/25/17 0605  TROPONINI <0.03 <0.03 <0.03   BNP: Invalid input(s): POCBNP CBG: Recent Labs  Lab 07/28/17 1117 07/28/17 1649 07/28/17 2120 07/29/17 0735 07/29/17 1116  GLUCAP 159* 148* 153* 103*  120*    Time coordinating discharge:  Greater than 30 minutes  Signed:  Orson Eva, DO [facility] Pager: [phone] 07/29/2017, 1:46 PM

## 2017-07-29 NOTE — Care Management (Signed)
Patient discharging home. Eligible for [facility] emmi calls. Agreeable. Gave flyer with info and placed order.

## 2017-07-29 NOTE — Care Management Important Message (Signed)
Important Message  Patient Details  Name: Jon White MRN: [id number] Date of Birth: 1933-04-09   Medicare Important Message Given:  Yes    Shealyn Sean, Chauncey [place], RN 07/29/2017, 2:18 PM

## 2017-08-02 ENCOUNTER — Other Ambulatory Visit: Payer: Self-pay

## 2017-08-02 NOTE — Patient Outreach (Addendum)
[facility]) Care Management  08/02/2017  Jon White 1932-08-04 [id number]   EMMI: COPD red alert Referral date: 08/02/17 Referral reason: # of times rescue  inhaler used in past 24 hours: 16 Day # 3  PROVIDER:  Dr. Rogelia Mire Dr. Chase Caller Dr. Elsworth Soho Dr. Annette Stable  Telephone call to patient regarding EMMI COPD red alert. HIPAA verified with patient. Discussed EMMI COPD program.    COPD: Patient states, " I am feeling about 80% better than I was prior to going in the hospital. Patient states, " I was in the hospital due to not being able to catch my breath." Patient verbally confirms he has COPD. Denies being on any home oxygen or having home health services. Patient reports he used his inhaler only 6 times within the past 24 hours. Patient states he uses a nebulizer.  States he may need a new one. Patient states he has a follow up appointment with his pulmonologist, Dr. Elsworth Soho on 08/04/17. States he will discuss getting a new nebulizer at that time.   Patient states he does not have a COPD action plan other than calling 911 for severe symptoms.  RNCM discussed COPD action plan with patient.    MEDICATIONS: Patient states he has all of his medications and takes them as prescribed.   APPOINTMENTS: Patient states he has not made a follow up appointment with his primary MD as of yet. RNCM stressed the importance of scheduling primary MD follow up.  Patient verbalized understanding.  Patient reports he has a follow up with Dr. Trenton Gammon in May 2019 to reassess a spot on his spine.    Discussed and offered [facility] care management program to patient. Patient verbally agreed to telephone follow up with nurse. Patient states he feels this will be beneficial for him.   SOCIAL SUPPORT: Patient states he lives with his wife. He is independent in his care and is able to drive to his appointments. Patient states he has supportive neighbors and children.  RNCM advised patient to notify MD of any  changes in condition prior to scheduled appointment. RNCM provided contact name and number: [phone] or main office number [phone] and 24 hour nurse advise line [phone].  RNCM verified patient aware of 911 services for urgent/ emergent needs.   ASSESSMENT; Per patient electronic MEDICAL RECORD [id number] y.o.malewith medical history significant of allergic rhinitis, asthma, atrial fibrillation, PUD with history of upper GI bleed, CAD/history of MI, cataract, chronic renal insufficiency stage III, COPD/emphysema, type 2 diabetes, diastolic heart failure,recurrentDVT and PE, GERD/hiatal hernia, hyperlipidemia, hypertension, OSA on CPAP, prostate CA in remission, urolithiasis,  PLAN: RNCM will refer patient to health coach for ongoing COPD disease management and education. RNCM will send patient [facility] care management welcome packet/ consent.   Quinn Plowman RN,BSN,CCM [facility] Telephonic  [phone]

## 2017-08-03 ENCOUNTER — Other Ambulatory Visit: Payer: Self-pay | Admitting: Internal Medicine

## 2017-08-04 ENCOUNTER — Other Ambulatory Visit: Payer: Self-pay

## 2017-08-04 ENCOUNTER — Telehealth: Payer: Self-pay | Admitting: Pulmonary Disease

## 2017-08-04 ENCOUNTER — Ambulatory Visit (INDEPENDENT_AMBULATORY_CARE_PROVIDER_SITE_OTHER): Payer: Medicare Other | Admitting: Podiatry

## 2017-08-04 ENCOUNTER — Encounter: Payer: Self-pay | Admitting: Podiatry

## 2017-08-04 VITALS — BP 114/66 | HR 66 | Ht 68.0 in | Wt 163.0 lb

## 2017-08-04 DIAGNOSIS — E1151 Type 2 diabetes mellitus with diabetic peripheral angiopathy without gangrene: Secondary | ICD-10-CM

## 2017-08-04 DIAGNOSIS — D689 Coagulation defect, unspecified: Secondary | ICD-10-CM | POA: Diagnosis not present

## 2017-08-04 DIAGNOSIS — B351 Tinea unguium: Secondary | ICD-10-CM | POA: Diagnosis not present

## 2017-08-04 MED ORDER — KETOCONAZOLE 2 % EX CREA: TOPICAL_CREAM | CUTANEOUS | 0 refills | Status: DC

## 2017-08-04 NOTE — Patient Outreach (Signed)
[facility]) Care Management  08/04/2017  Jon White [birth year] [id number]  EMMI: COPD red alert Referral date: 08/04/17 Referral reason: # of times rescue  inhaler used in past 24 hours: 3 Day # 5  PROVIDER:  Dr. Rogelia Mire Dr. Chase Caller Dr. Elsworth Soho Dr. Annette Stable  Telephone call to patient regarding EMMI COPD red alert. HIPAA verified with patient. Patient states he is doing fine. Patient states, "I'm not having any increase in my symptoms."  Patient states I am scheduled to see my pulmonologist, Dr. Elsworth Soho on tomorrow 08/05/17.  Patient states he is aware of what symptoms to call his doctors office for.  Patient states he is aware to call 911 if his symptoms become severe.   RNCM reminded patient that a [place] coach would be calling to follow up him. Patient verbalized agreement.  Patient aware and verbalized agreement to continue to receive EMMI  COPD calls.   Patient denies any concerns at this time.   PLAN: No additional follow up needed by this RNCM at this time.  RNCM will notify health coach of patients current status.   Quinn Plowman RN,BSN,CCM [facility] Telephonic  [phone]

## 2017-08-04 NOTE — Progress Notes (Signed)
   Subjective:    Patient ID: Jon White, male    DOB: 04/05/1933, 84 y.o.   MRN: [id number]  HPI  Chief Complaint  Patient presents with  . Nail Problem    bilateral elongated thickened toenails/ chronic swollen ankles  . Diabetes    due to steroid use for COPD       Review of Systems  Respiratory: Positive for apnea and shortness of breath.   Musculoskeletal: Positive for joint swelling.  All other systems reviewed and are negative.      Objective:   Physical Exam        Assessment & Plan:

## 2017-08-04 NOTE — Telephone Encounter (Signed)
Reviewed RA's charts tomorrow. Patient was seen by MW back in January 2019 and was advised to follow up in 6 weeks with RA with PFTs on return.   The PFT was scheduled incorrectly and the two PFT rooms were completely book on 08/05/17, thus leaving nowhere for the patient to be rescheduled.   Spoke with RA, he stated that the patient did not need PFTs.   Spoke with patient, he is aware that the breathing test is not needed. Patient will still see RA for his appt.   Nothing else needed at time of call.

## 2017-08-05 ENCOUNTER — Ambulatory Visit (INDEPENDENT_AMBULATORY_CARE_PROVIDER_SITE_OTHER): Payer: Medicare Other | Admitting: Pulmonary Disease

## 2017-08-05 ENCOUNTER — Encounter: Payer: Medicare Other | Admitting: Pulmonary Disease

## 2017-08-05 ENCOUNTER — Encounter: Payer: Self-pay | Admitting: Pulmonary Disease

## 2017-08-05 DIAGNOSIS — J449 Chronic obstructive pulmonary disease, unspecified: Secondary | ICD-10-CM

## 2017-08-05 DIAGNOSIS — J441 Chronic obstructive pulmonary disease with (acute) exacerbation: Secondary | ICD-10-CM | POA: Diagnosis not present

## 2017-08-05 DIAGNOSIS — N183 Chronic kidney disease, stage 3 unspecified: Secondary | ICD-10-CM

## 2017-08-05 DIAGNOSIS — I2510 Atherosclerotic heart disease of native coronary artery without angina pectoris: Secondary | ICD-10-CM | POA: Diagnosis not present

## 2017-08-05 MED ORDER — BUDESONIDE-FORMOTEROL FUMARATE 160-4.5 MCG/ACT IN AERO: 2.0000 | INHALATION_SPRAY | Freq: Two times a day (BID) | RESPIRATORY_TRACT | 0 refills | Status: DC

## 2017-08-05 NOTE — Progress Notes (Signed)
   Subjective:    Patient ID: Jon White, male    DOB: [birth year], 84 y.o.   MRN: [id number]  HPI  84 year old for follow-up of moderate COPD& OSA Maintainedon Symbicort and Incruse- he has various allergies to Spiriva and Breo Hehas a history of right lower lobectomy in the remote past for benign lesion He has a history of recurrent DVT/PE, on anticoagulation. He has a history of CAD, chronic systolic heart failure and CKD, last creatinine was 1.4.  He was hospitalized for a week in February for COPD exacerbation, chest x-ray personally reviewed by me did not show infiltrates or effusions, he was treated with IV steroids, both dilators and doxycycline with significant improvement, oxygenation and improved by the time of discharge. Overall he had a good experience at [facility] hospital  He has developed significant bipedal edema since then, is on Lasix 40 mg daily He had an office visit in January 2019 for COPD exacerbation  He requests a new nebulizer machine.  Was started on duo nebs but has not filled this prescription yet, remains on Symbicort. In the past ,incruse did not work.  [name] was too expensive  No problems with CPAP mask or pressure, wife feels that he is sleeping better and resting well    Significant tests/ events  PFTs 2014 FEV1 42%-1.08,significant bronchodilator response with improvement to 59%  NPSG 2008: AHI 11/hr, but no supine sleep.  Titration study: cpap 8cm.  Auto 06/2011: Optimal pressure 12cm.  PSG 01/2017 ([place]) RDI 12/hours-, predominant events during REM sleep with lowest desaturation 80% corrected by CPAP 13 cm,    Review of Systems neg for any significant sore throat, dysphagia, itching, sneezing, nasal congestion or excess/ purulent secretions, fever, chills, sweats, unintended wt loss, pleuritic or exertional cp, hempoptysis, orthopnea pnd  . Also denies presyncope, palpitations, heartburn, abdominal pain, nausea, vomiting,  diarrhea or change in bowel or urinary habits, dysuria,hematuria, rash, arthralgias, visual complaints, headache, numbness weakness or ataxia.     Objective:   Physical Exam  Gen. Pleasant, well-nourished, in no distress ENT - no thrush, no post nasal drip Neck: No JVD, no thyromegaly, no carotid bruits Lungs: no use of accessory muscles, no dullness to percussion, clear without rales or rhonchi  Cardiovascular: Rhythm regular, heart sounds  normal, no murmurs or gallops, 2+ peripheral edema Musculoskeletal: No deformities, no cyanosis or clubbing         Assessment & Plan:

## 2017-08-05 NOTE — Patient Instructions (Signed)
Rx for nebuliser will be sent to [facility] Stay on symbicort - 2 puffs twice daily

## 2017-08-05 NOTE — Addendum Note (Signed)
Addended by: Rosana Berger on: 08/05/2017 03:38 PM   Modules accepted: Orders

## 2017-08-05 NOTE — Addendum Note (Signed)
Addended by: Valerie Salts on: 08/05/2017 03:42 PM   Modules accepted: Orders

## 2017-08-05 NOTE — Assessment & Plan Note (Signed)
Rx for nebuliser will be sent to [facility] Stay on symbicort - 2 puffs twice daily Fill in Rx for duonebs Consider pulm rehab in the future

## 2017-08-05 NOTE — Assessment & Plan Note (Signed)
Increased pedal edema may be related to steroids, he does not appear to be in overt failure. Continue Lasix 40 mg daily

## 2017-08-10 ENCOUNTER — Other Ambulatory Visit: Payer: Self-pay

## 2017-08-10 NOTE — Patient Outreach (Signed)
[facility]) Care Management  08/10/2017  CARLA WHILDEN 1933/05/19 [id number]   Unsuccessful outreach attempt to the patient for initial assessment.  No answer.  HIPAA compliant  Voicemail left with contact information  Plan:  [place] will make an outreach attempt to the patient in one business day.  [name] RN, BSN, [place] Direct Dial:  [phone]  Fax: [phone]

## 2017-08-11 ENCOUNTER — Other Ambulatory Visit: Payer: Self-pay

## 2017-08-11 NOTE — Patient Outreach (Addendum)
[facility]) Care Management  08/11/2017  Jon White 02/11/1933 [id number]   2nd telephone outreach to the patient. No answer. HIPAA compliant voicemail left with contact information.  Plan: RN Health Coach will send letter for outreach attempt. RN Health Coach will make 3rd telephone outreach attempt in ten business days. No answer I will proceed with case closure.    [name] RN, BSN, [place] Direct Dial:  [phone]  Fax: [phone]

## 2017-08-12 ENCOUNTER — Other Ambulatory Visit: Payer: Self-pay

## 2017-08-12 NOTE — Patient Outreach (Signed)
**Note Jon-Identified via Obfuscation** [place] [facility]) Care Management  [place]  08/12/2017   [name] White 22-Dec-1932 [id number]  Subjective: Received call from the patient for initial assessment.  HIPAA verified.  The patient states that he lives with his spouse.  He independently performs his ADLS/IADLS.  He denies any pain or falls. The patient states that he is not having any pain.  He state that yesterday he had quite a bit of swelling in his ankles but he took two of his fluid pills and they look better today. The patient weighed himself today and his weight is 163.  He states his breathing is well he does have some mucous but since he has been using his nebulizer it seems to come up easier.The patient states that he is able to drive to his appointments. The patient has durable medical equipment in the home a scale, walker, cane, nebulizer and BiPAP. He states that he is adherent with his medications  He just had an appointment on 3/8 with Dr Elsworth Soho and was ordered a new nebulizer.  The patient states that he has an advanced directive but is unsure if his doctor office has a copy.  RN Health Coach asked the patient to follow up with his physician office.  The patient verbalized understanding.   Encounter Medications:  Outpatient Encounter Medications as of 08/12/2017  Medication Sig  . albuterol (PROVENTIL HFA;VENTOLIN HFA) 108 (90 Base) MCG/ACT inhaler Inhale 2 puffs into the lungs every 6 (six) hours as needed for wheezing or shortness of breath.  [name] Kitchen albuterol (PROVENTIL) (2.5 MG/3ML) 0.083% nebulizer solution Take 2.5 mg by nebulization 4 (four) times daily.  [name] Kitchen atorvastatin (LIPITOR) 10 MG tablet Take 10 mg by mouth daily at 6 PM.   . budesonide-formoterol (SYMBICORT) 160-4.5 MCG/ACT inhaler Inhale 2 puffs into the lungs 2 (two) times daily.  [name] Kitchen ELIQUIS 5 MG TABS tablet TAKE 1 TABLET(5 MG) BY MOUTH TWICE DAILY  . esomeprazole (NEXIUM) 40 MG capsule Take 1 capsule (40 mg total) by mouth daily.  .  ferrous sulfate 220 (44 Fe) MG/5ML solution Take 220 mg by mouth every other day.  . fluocinonide cream (LIDEX) 3.61 % Apply 1 application topically daily as needed (rash). To infected area.  . fluticasone (FLONASE) 50 MCG/ACT nasal spray Place 2 sprays into the nose at bedtime. Each nostril once a day.  . furosemide (LASIX) 40 MG tablet TAKE 1 TABLET(40 MG) BY MOUTH DAILY  . ipratropium (ATROVENT) 0.02 % nebulizer solution Take 2.5 mLs (0.5 mg total) by nebulization 4 (four) times daily.  [name] Kitchen ketoconazole (NIZORAL) 2 % cream Apply 1 fingertip amount to bottom of each foot and toenails daily  . metoprolol tartrate (LOPRESSOR) 25 MG tablet Take 0.5 tablets (12.5 mg total) by mouth 2 (two) times daily.  . nitroGLYCERIN (NITROSTAT) 0.4 MG SL tablet Place 1 tablet (0.4 mg total) under the tongue every 5 (five) minutes as needed for chest pain.  [name] Kitchen PROAIR HFA 108 (90 Base) MCG/ACT inhaler INHALE 2 PUFFS BY MOUTH EVERY 6 HOURS AS NEEDED FOR WHEEZING OR SHORTNESS OF BREATH  . tamsulosin (FLOMAX) 0.4 MG CAPS capsule Take 0.4 mg by mouth daily.  . budesonide-formoterol (SYMBICORT) 160-4.5 MCG/ACT inhaler Inhale 2 puffs into the lungs 2 (two) times daily.   No facility-administered encounter medications on file as of 08/12/2017.     Functional Status:  In your present state of health, do you have any difficulty performing the following activities: 07/25/2017  Hearing? Y  Vision? N  Difficulty concentrating or making decisions? N  Walking or climbing stairs? Y  Dressing or bathing? N  Doing errands, shopping? Y  Some recent data might be hidden    Fall/Depression Screening: Fall Risk  08/12/2017 04/01/2016  Falls in the past year? No No  Risk for fall due to : - (No Data)  Risk for fall due to: Comment - No history of falls   PHQ 2/9 Scores 08/12/2017 08/02/2017 07/28/2016 04/01/2016  PHQ - 2 Score 1 0 1 0  PHQ- 9 Score - - 4 2  Exception Documentation - - - (No Data)    Assessment: Patient will  benefit from health coach outreach for disease management and support.   THN CM Care Plan Problem One     Most Recent Value  Care Plan Problem One  Knowledge deficit  Role Documenting the Problem One  [place] for Problem One  Active  THN Long Term Goal   In 90 days the patient will verbalize that he hasnot been in the hospital  [place] Term Goal Start Date  08/12/17  Interventions for Problem One Long Term Goal  [facility] will send educational material to the patient  [facility] CM Short Term Goal #1   In 30 days the patient state two symptoms in the copd red zone.  THN CM Short Term Goal #1 Start Date  08/12/17  Interventions for Short Term Goal #1  [facility] will send the patient educational material about the copd action plan and discussed it with the patient on the phone.  THN CM Short Term Goal #2   In 30 days the patient will verbalize tht he is maintaining his weight within two pounds  THN CM Short Term Goal #2 Start Date  08/12/17  Interventions for Short Term Goal #2  [facility] talked with the patient about his weight and why it is good to maintain and measure it.     Plan: RN Health Coach will provide ongoing education for patient on COPD through phone calls and sending printed information to patient for further discussion.  RN Health Coach will send welcome packet with consent to patient as well as printed information on COPD.  RN Health Coach will send initial barriers letter, assessment, and care plan to primary care physician.  [name] RN, BSN, [place] Direct Dial:  [phone]  Fax: [phone]

## 2017-08-15 ENCOUNTER — Other Ambulatory Visit: Payer: Self-pay

## 2017-08-15 NOTE — Patient Outreach (Signed)
[place] [facility]) Care Management  08/15/2017  Jon White 1932/11/09 [id number]   EMMI- COPD Red on EMMI Alert:  Day # 14 Date: 08/12/2017 Red Alert Reason: # of times rescue inhaler used in past 24 hours?                     Been to follow-up appointment?  Outreach attempt # 1  to patient  Unsuccessful outreach to the patient for EMMI call.  HIPAA compliant voicemail left with contact information.   Plan: RN Health Coach will make an outreach attempt to the patient in one business day.  [name] RN, BSN, [place] Direct Dial:  [phone] Fax: [phone]

## 2017-08-16 ENCOUNTER — Other Ambulatory Visit: Payer: Self-pay

## 2017-08-16 NOTE — Patient Outreach (Signed)
[place] [facility]) Care Management  08/16/2017  HILLERY BHALLA [birth year] [id number]   EMMI- Red on EMMI Alert: Day # 15 Date:08/12/17 Red Alert Reason: # of times rescue inhaler used in past 24 hours?                     Been to follow-up appointment?  Outreach attempt # 2 to patient  Unsuccessful outreach to the patient. No answer. HIPAA compliant voicemail left with contact information.  Plan: RN Health Coach will send letter for outreach attempt. RN Health Coach will make 3rd telephone outreach attempt in ten business days. No answer I will proceed with case closure  [name] RN, BSN, [place] Direct Dial:  [phone] Fax: [phone]

## 2017-08-17 DIAGNOSIS — N183 Chronic kidney disease, stage 3 (moderate): Secondary | ICD-10-CM | POA: Diagnosis not present

## 2017-08-17 DIAGNOSIS — D649 Anemia, unspecified: Secondary | ICD-10-CM | POA: Diagnosis not present

## 2017-08-24 ENCOUNTER — Ambulatory Visit: Payer: Self-pay

## 2017-08-24 DIAGNOSIS — G4733 Obstructive sleep apnea (adult) (pediatric): Secondary | ICD-10-CM | POA: Diagnosis not present

## 2017-08-24 DIAGNOSIS — J9601 Acute respiratory failure with hypoxia: Secondary | ICD-10-CM | POA: Diagnosis not present

## 2017-08-24 DIAGNOSIS — G250 Essential tremor: Secondary | ICD-10-CM | POA: Diagnosis not present

## 2017-08-24 DIAGNOSIS — I10 Essential (primary) hypertension: Secondary | ICD-10-CM | POA: Diagnosis not present

## 2017-08-24 DIAGNOSIS — Z09 Encounter for follow-up examination after completed treatment for conditions other than malignant neoplasm: Secondary | ICD-10-CM | POA: Diagnosis not present

## 2017-08-24 DIAGNOSIS — J441 Chronic obstructive pulmonary disease with (acute) exacerbation: Secondary | ICD-10-CM | POA: Diagnosis not present

## 2017-08-24 DIAGNOSIS — I5042 Chronic combined systolic (congestive) and diastolic (congestive) heart failure: Secondary | ICD-10-CM | POA: Diagnosis not present

## 2017-08-24 DIAGNOSIS — E782 Mixed hyperlipidemia: Secondary | ICD-10-CM | POA: Diagnosis not present

## 2017-08-24 DIAGNOSIS — I2510 Atherosclerotic heart disease of native coronary artery without angina pectoris: Secondary | ICD-10-CM | POA: Diagnosis not present

## 2017-08-24 DIAGNOSIS — N183 Chronic kidney disease, stage 3 (moderate): Secondary | ICD-10-CM | POA: Diagnosis not present

## 2017-08-24 DIAGNOSIS — E119 Type 2 diabetes mellitus without complications: Secondary | ICD-10-CM | POA: Diagnosis not present

## 2017-08-24 DIAGNOSIS — I2699 Other pulmonary embolism without acute cor pulmonale: Secondary | ICD-10-CM | POA: Diagnosis not present

## 2017-08-28 NOTE — Progress Notes (Signed)
Subjective:  Patient ID: Jon White, male    DOB: 05/17/1933,  MRN: [id number]  Chief Complaint  Patient presents with  . Nail Problem    bilateral elongated thickened toenails/ chronic swollen ankles  . Diabetes    due to steroid use for COPD   84 y.o. male presents for diabetic foot care.Reports history of diabetes due to steroids.  Reports bilateral elongated thickened toenails.  Reports chronic swollen ankles. Reports he was recently hospitalized for COPD. On eliquis  Past Medical History:  Diagnosis Date  . Allergic rhinitis   . Allergy   . Asthma   . Atrial fibrillation ([place])   . Bleeding gastric ulcer   . Blood transfusion without reported diagnosis   . CAD (coronary artery disease) 07/24/2017  . Cataract    bilateral removed  . Chronic combined systolic and diastolic congestive heart failure ([place])   . Chronic renal insufficiency, stage III (moderate) (HCC)   . COPD (chronic obstructive pulmonary disease) ([place])   . Diabetes mellitus without complication ([place])    no meds- boarderline  . Diastolic heart failure ([place])   . Duodenum ulcer    with perforation  . DVT of axillary vein, acute left ([place]) 10/24/2014  . Emphysema   . GERD (gastroesophageal reflux disease)   . H/O hiatal hernia   . High cholesterol   . Hypertension   . Mixed hyperlipidemia   . Myocardial infarction ([place])   . Neuromuscular disorder ([place])    reports shaking, somedays "can't hold spoon" - generalized  shakiness, is going to talk to MD about it at next appt.   . Normal cardiac stress test    reports stress test was several yrs. ago /w [employer]. Dr. Rogelia Mire, WNL   . OSA (obstructive sleep apnea) 2007   CPAP- report of settings on paper chart, seen by [facility] - seen 07/2011   . PE (pulmonary embolism) 10/2011  . Prostate cancer ([place]) 1992   in remission.  s/p XRT, melanoma under left eye, left temple  . Recurrent upper respiratory infection (URI)    coughing from chronic sinusitis   .  Renal stone    some passed spontaneous, & also had cystoscopy  . Shortness of breath on exertion   . Sleep apnea    wears c-pap   Past Surgical History:  Procedure Laterality Date  . CATARACT EXTRACTION W/ INTRAOCULAR LENS  IMPLANT, BILATERAL  ~ 2000  . COLONOSCOPY N/A 04/03/2014   Procedure: COLONOSCOPY;  Surgeon: Inda Castle, MD;  Location: [facility];  Service: Endoscopy;  Laterality: N/A;  . ESOPHAGOGASTRODUODENOSCOPY N/A 08/28/2013   Procedure: ESOPHAGOGASTRODUODENOSCOPY (EGD);  Surgeon: Gayland Curry, MD;  Location: [place];  Service: General;  Laterality: N/A;  . ESOPHAGOGASTRODUODENOSCOPY N/A 04/03/2014   Procedure: ESOPHAGOGASTRODUODENOSCOPY (EGD);  Surgeon: Inda Castle, MD;  Location: [facility];  Service: Endoscopy;  Laterality: N/A;  . GASTROSTOMY N/A 08/28/2013   Procedure: GASTROSTOMY;  Surgeon: Rolm Bookbinder, MD;  Location: [place];  Service: General;  Laterality: N/A;  . LAPAROTOMY N/A 08/28/2013   Procedure: EXPLORATORY LAPAROTOMY;  Surgeon: Rolm Bookbinder, MD;  Location: [place];  Service: General;  Laterality: N/A;  . LOBECTOMY Right 1986   1/3 removed   . LUNG SURGERY  1986   presumed from RLL. Segmentectomy. Benign Nodule.   [name] Kitchen MAXILLARY ANTROSTOMY  09/15/2011   Procedure: MAXILLARY ANTROSTOMY;  Surgeon: Ascencion Dike, MD;  Location: [facility] OR;  Service: ENT;  Laterality: Bilateral;  TOTAL MAXILLARY ANTROSTOMY  .  MELANOMA EXCISION Left    under left eye  . REPAIR OF PERFORATED ULCER N/A 08/28/2013   Procedure: DUODENAL ULCER PATCH;  Surgeon: Rolm Bookbinder, MD;  Location: [place];  Service: General;  Laterality: N/A;  . SEPTOPLASTY  09/15/11  . SEPTOPLASTY  09/15/2011   Procedure: SEPTOPLASTY;  Surgeon: Ascencion Dike, MD;  Location: [place];  Service: ENT;  Laterality: Bilateral;  . SINUS ENDO W/FUSION  sch. 09/15/2011   Procedure: ENDOSCOPIC SINUS SURGERY WITH FUSION NAVIGATION;  Surgeon: Ascencion Dike, MD;  Location: [place];  Service: ENT;  Laterality:  Bilateral;  bilateral maxillary antrostomy, bilateral endoscopic ethmoidectomy, bilateral sphenoidectomy, bilateral frontal recess exploration  . SINUS ENDO W/FUSION  09/15/2011   Procedure: ENDOSCOPIC SINUS SURGERY WITH FUSION NAVIGATION;  Surgeon: Ascencion Dike, MD;  Location: [place];  Service: ENT;  Laterality: Bilateral;  . SINUS EXPLORATION  09/15/2011   Procedure: SINUS EXPLORATION;  Surgeon: Ascencion Dike, MD;  Location: [place];  Service: ENT;  Laterality: Bilateral;  FRONTAL RECESS EXPLORATION  . SPHENOIDECTOMY  09/15/2011   Procedure: Coralee P[place];  Surgeon: Ascencion Dike, MD;  Location: [place];  Service: ENT;  Laterality: Bilateral;  . URETEROSCOPY  1980's    Current Outpatient Medications:  .  albuterol (PROVENTIL HFA;VENTOLIN HFA) 108 (90 Base) MCG/ACT inhaler, Inhale 2 puffs into the lungs every 6 (six) hours as needed for wheezing or shortness of breath., Disp: 1 Inhaler, Rfl: 2 .  albuterol (PROVENTIL) (2.5 MG/3ML) 0.083% nebulizer solution, Take 2.5 mg by nebulization 4 (four) times daily., Disp: , Rfl:  .  atorvastatin (LIPITOR) 10 MG tablet, Take 10 mg by mouth daily at 6 PM. , Disp: , Rfl:  .  budesonide-formoterol (SYMBICORT) 160-4.5 MCG/ACT inhaler, Inhale 2 puffs into the lungs 2 (two) times daily., Disp: 1 Inhaler, Rfl: 0 .  budesonide-formoterol (SYMBICORT) 160-4.5 MCG/ACT inhaler, Inhale 2 puffs into the lungs 2 (two) times daily., Disp: 2 Inhaler, Rfl: 0 .  ELIQUIS 5 MG TABS tablet, TAKE 1 TABLET(5 MG) BY MOUTH TWICE DAILY, Disp: 180 tablet, Rfl: 3 .  esomeprazole (NEXIUM) 40 MG capsule, Take 1 capsule (40 mg total) by mouth daily., Disp: 90 capsule, Rfl: 6 .  ferrous sulfate 220 (44 Fe) MG/5ML solution, Take 220 mg by mouth every other day., Disp: , Rfl:  .  fluocinonide cream (LIDEX) 6.96 %, Apply 1 application topically daily as needed (rash). To infected area., Disp: , Rfl:  .  fluticasone (FLONASE) 50 MCG/ACT nasal spray, Place 2 sprays into the nose at bedtime. Each nostril  once a day., Disp: , Rfl:  .  furosemide (LASIX) 40 MG tablet, TAKE 1 TABLET(40 MG) BY MOUTH DAILY, Disp: 90 tablet, Rfl: 3 .  ipratropium (ATROVENT) 0.02 % nebulizer solution, Take 2.5 mLs (0.5 mg total) by nebulization 4 (four) times daily., Disp: 75 mL, Rfl: 1 .  ketoconazole (NIZORAL) 2 % cream, Apply 1 fingertip amount to bottom of each foot and toenails daily, Disp: 30 g, Rfl: 0 .  metoprolol tartrate (LOPRESSOR) 25 MG tablet, Take 0.5 tablets (12.5 mg total) by mouth 2 (two) times daily., Disp: 90 tablet, Rfl: 3 .  nitroGLYCERIN (NITROSTAT) 0.4 MG SL tablet, Place 1 tablet (0.4 mg total) under the tongue every 5 (five) minutes as needed for chest pain., Disp: 75 tablet, Rfl: 3 .  PROAIR HFA 108 (90 Base) MCG/ACT inhaler, INHALE 2 PUFFS BY MOUTH EVERY 6 HOURS AS NEEDED FOR WHEEZING OR SHORTNESS OF BREATH, Disp: 8.5 g,  Rfl: 0 .  tamsulosin (FLOMAX) 0.4 MG CAPS capsule, Take 0.4 mg by mouth daily., Disp: , Rfl:   Allergies  Allergen Reactions  . Mometasone Furoate Other (See Comments)    Nasonex: CAUSED RED,ITCHY EYES; "lost my eyelashes"   . Naproxen Sodium Nausea And Vomiting  . Nsaids Other (See Comments)    Stomach ulcers  . Anoro Ellipta [Umeclidinium-Vilanterol] Other (See Comments)    Double vision  . Aspirin Hives and Nausea And Vomiting     Chills, sweats  . Bevespi Aerosphere [Glycopyrrolate-Formoterol] Hives  . Pulmicort [Budesonide] Hives  . Breo Ellipta [Fluticasone Furoate-Vilanterol] Hives  . Dexilant [Dexlansoprazole] Diarrhea    Cramps   . Omnaris [Ciclesonide] Other (See Comments)    Nose bleed   . Spiriva Handihaler [Tiotropium Bromide Monohydrate] Other (See Comments)    Hiccups      Objective:   General AA&O x3. Normal mood and affect.  Vascular Dorsalis pedis pulses present 1+ bilaterally  Posterior tibial pulses absent bilaterally  Capillary refill normal to all digits. Pedal hair growth normal.  Neurologic Epicritic sensation present  bilaterally. Protective sensation with 5.07 monofilament  present bilaterally. Vibratory sensation present bilaterally.  Dermatologic No open lesions. Interspaces clear of maceration.  Normal skin temperature and turgor. Hyperkeratotic lesions: None bilaterally. Nails: brittle, onychomycosis, thickening, elongation  Orthopedic: No history of amputation. MMT 5/5 in dorsiflexion, plantarflexion, inversion, and eversion. Normal lower extremity joint ROM without pain or crepitus.    Assessment & Plan:  Patient was evaluated and treated and all questions answered.  Diabetes with PAD, Onychomycosis, coagulation defect -Educated on diabetic footcare. Diabetic risk level 1 -At risk foot care provided as below.   -Will order ABIs  Procedure: Nail Debridement Rationale: Patient meets criteria for routine foot care due to  coagulation defect, class B findings Type of Debridement: manual, sharp debridement. Instrumentation: Nail nipper, rotary burr. Number of Nails: 10  Return in about 3 months (around 11/04/2017) for Diabetic Foot Care.

## 2017-08-29 ENCOUNTER — Other Ambulatory Visit: Payer: Self-pay

## 2017-08-29 ENCOUNTER — Telehealth: Payer: Self-pay | Admitting: *Deleted

## 2017-08-29 ENCOUNTER — Telehealth: Payer: Self-pay | Admitting: Pulmonary Disease

## 2017-08-29 DIAGNOSIS — E1151 Type 2 diabetes mellitus with diabetic peripheral angiopathy without gangrene: Secondary | ICD-10-CM

## 2017-08-29 MED ORDER — IPRATROPIUM BROMIDE 0.02 % IN SOLN: 0.5000 mg | Freq: Four times a day (QID) | RESPIRATORY_TRACT | 12 refills | Status: DC

## 2017-08-29 NOTE — Telephone Encounter (Signed)
Called and spoke to check to see if there were any other neb meds pt was taking.  Pt stated to me the only neb med he had been doing was the Atrovent after it was prescribed while he was in the hospital.  Stated to pt we could refill med for him. Verified with pt which pharmacy med needed to go to and sent refill Rx of atrovent neb sol to pt's pharmacy for him.  Nothing further needed at this time.

## 2017-08-29 NOTE — Telephone Encounter (Signed)
Called and spoke with pt who is requsting a refill or atrovent neb sol. Pt stated to me he was prescribed atrovent while he was in the hospital, and med was prescribed by Dr. Shanon Brow Tat.  Dr. Elsworth Soho, please advise if you are fine with [place] refilling this neb sol for pt. Thanks!

## 2017-08-29 NOTE — Telephone Encounter (Signed)
Please check on what other nebulizer medication he is on. If he is on duo nebs already then and please inform him that that contains Atrovent. If he is only on albuterol, then okay to send in refill for Atrovent

## 2017-08-29 NOTE — Telephone Encounter (Signed)
Faxed orders to [facility]. 

## 2017-08-29 NOTE — Patient Outreach (Signed)
[place]) Care Management  08/29/2017  LANGSTON TUBERVILLE 05-13-1933 [id number]   EMMI- Red on EMMI Alert: Day # 15 Date:08/29/17 Red Alert Reason: # of times rescue inhaler used in past 24 hours?                                Been to follow-up appointment?  Outreach attempt # 3 to patient  Unsuccessful outreach to the patient. No answer. HIPAA compliant voicemail left with contact information.  Plan: RN Health Coach will proceed with case closure due to unable to contact.  [name] RN, BSN, [place] Direct Dial: [phone]

## 2017-08-30 ENCOUNTER — Other Ambulatory Visit: Payer: Self-pay | Admitting: Podiatry

## 2017-08-30 DIAGNOSIS — R0989 Other specified symptoms and signs involving the circulatory and respiratory systems: Secondary | ICD-10-CM

## 2017-08-30 DIAGNOSIS — E1151 Type 2 diabetes mellitus with diabetic peripheral angiopathy without gangrene: Secondary | ICD-10-CM

## 2017-08-31 ENCOUNTER — Ambulatory Visit (HOSPITAL_COMMUNITY)
Admission: RE | Admit: 2017-08-31 | Discharge: 2017-08-31 | Disposition: A | Discharge status: Home / Self Care | Payer: Medicare Other | Source: Ambulatory Visit | Attending: Cardiovascular Disease | Admitting: Cardiovascular Disease

## 2017-08-31 DIAGNOSIS — R0989 Other specified symptoms and signs involving the circulatory and respiratory systems: Secondary | ICD-10-CM | POA: Insufficient documentation

## 2017-08-31 DIAGNOSIS — E1151 Type 2 diabetes mellitus with diabetic peripheral angiopathy without gangrene: Secondary | ICD-10-CM

## 2017-09-15 ENCOUNTER — Ambulatory Visit: Payer: Medicare Other

## 2017-09-26 ENCOUNTER — Other Ambulatory Visit: Payer: Self-pay | Admitting: Internal Medicine

## 2017-09-27 DIAGNOSIS — I2510 Atherosclerotic heart disease of native coronary artery without angina pectoris: Secondary | ICD-10-CM | POA: Diagnosis not present

## 2017-09-27 DIAGNOSIS — E782 Mixed hyperlipidemia: Secondary | ICD-10-CM | POA: Diagnosis not present

## 2017-09-27 DIAGNOSIS — Z0000 Encounter for general adult medical examination without abnormal findings: Secondary | ICD-10-CM | POA: Diagnosis not present

## 2017-09-27 DIAGNOSIS — R7301 Impaired fasting glucose: Secondary | ICD-10-CM | POA: Diagnosis not present

## 2017-09-27 DIAGNOSIS — I10 Essential (primary) hypertension: Secondary | ICD-10-CM | POA: Diagnosis not present

## 2017-10-04 DIAGNOSIS — I2699 Other pulmonary embolism without acute cor pulmonale: Secondary | ICD-10-CM | POA: Diagnosis not present

## 2017-10-04 DIAGNOSIS — E119 Type 2 diabetes mellitus without complications: Secondary | ICD-10-CM | POA: Diagnosis not present

## 2017-10-04 DIAGNOSIS — E782 Mixed hyperlipidemia: Secondary | ICD-10-CM | POA: Diagnosis not present

## 2017-10-04 DIAGNOSIS — R21 Rash and other nonspecific skin eruption: Secondary | ICD-10-CM | POA: Diagnosis not present

## 2017-10-04 DIAGNOSIS — G4733 Obstructive sleep apnea (adult) (pediatric): Secondary | ICD-10-CM | POA: Diagnosis not present

## 2017-10-04 DIAGNOSIS — I10 Essential (primary) hypertension: Secondary | ICD-10-CM | POA: Diagnosis not present

## 2017-10-04 DIAGNOSIS — I2510 Atherosclerotic heart disease of native coronary artery without angina pectoris: Secondary | ICD-10-CM | POA: Diagnosis not present

## 2017-10-04 DIAGNOSIS — I214 Non-ST elevation (NSTEMI) myocardial infarction: Secondary | ICD-10-CM | POA: Diagnosis not present

## 2017-10-04 DIAGNOSIS — J449 Chronic obstructive pulmonary disease, unspecified: Secondary | ICD-10-CM | POA: Diagnosis not present

## 2017-10-04 DIAGNOSIS — I5042 Chronic combined systolic (congestive) and diastolic (congestive) heart failure: Secondary | ICD-10-CM | POA: Diagnosis not present

## 2017-10-04 DIAGNOSIS — N183 Chronic kidney disease, stage 3 (moderate): Secondary | ICD-10-CM | POA: Diagnosis not present

## 2017-10-10 ENCOUNTER — Other Ambulatory Visit: Payer: Self-pay | Admitting: Internal Medicine

## 2017-10-12 DIAGNOSIS — J310 Chronic rhinitis: Secondary | ICD-10-CM | POA: Diagnosis not present

## 2017-10-12 DIAGNOSIS — J330 Polyp of nasal cavity: Secondary | ICD-10-CM | POA: Diagnosis not present

## 2017-10-17 ENCOUNTER — Ambulatory Visit (INDEPENDENT_AMBULATORY_CARE_PROVIDER_SITE_OTHER): Payer: Medicare Other | Admitting: Pulmonary Disease

## 2017-10-17 ENCOUNTER — Encounter: Payer: Self-pay | Admitting: Pulmonary Disease

## 2017-10-17 DIAGNOSIS — I5042 Chronic combined systolic (congestive) and diastolic (congestive) heart failure: Secondary | ICD-10-CM | POA: Diagnosis not present

## 2017-10-17 DIAGNOSIS — J441 Chronic obstructive pulmonary disease with (acute) exacerbation: Secondary | ICD-10-CM

## 2017-10-17 DIAGNOSIS — I2510 Atherosclerotic heart disease of native coronary artery without angina pectoris: Secondary | ICD-10-CM | POA: Diagnosis not present

## 2017-10-17 MED ORDER — PREDNISONE 10 MG PO TABS: ORAL_TABLET | ORAL | 0 refills | Status: DC

## 2017-10-17 MED ORDER — APIXABAN 5 MG PO TABS: ORAL_TABLET | ORAL | 0 refills | Status: DC

## 2017-10-17 MED ORDER — METHYLPREDNISOLONE ACETATE 80 MG/ML IJ SUSP
80.0000 mg | Freq: Once | INTRAMUSCULAR | Status: AC
  Administered 2017-10-17: 80 mg via INTRAMUSCULAR

## 2017-10-17 NOTE — Assessment & Plan Note (Signed)
Stay on Lasix 40 daily

## 2017-10-17 NOTE — Patient Instructions (Signed)
Solu-Medrol 80 mg IM X1 Prednisone 10 mg tabs Take 4 tabs  daily with food x 4 days, then 3 tabs daily x 4 days, then 2 tabs daily x 4 days, then 1 tab daily x4 days then stop. #40   Stay on Symbicort 2 puffs twice daily, rinse mouth after use. Use DuoNeb's 3-4 times daily

## 2017-10-17 NOTE — Assessment & Plan Note (Signed)
Solu-Medrol 80 mg IM X1 Prednisone 10 mg tabs Take 4 tabs  daily with food x 4 days, then 3 tabs daily x 4 days, then 2 tabs daily x 4 days, then 1 tab daily x4 days then stop. #40   Stay on Symbicort 2 puffs twice daily, rinse mouth after use. Use DuoNeb's 3-4 times daily

## 2017-10-17 NOTE — Progress Notes (Signed)
   Subjective:    Patient ID: Jon White, male    DOB: 12-19-1932, 84 y.o.   MRN: [id number]  HPI  84 year old for follow-up of moderate COPD& OSA Maintainedon Symbicort  Hehas a history of right lower lobectomy in the remote past for benign lesion He has a history of recurrent DVT/PE, on anticoagulation. He has a history of CAD, chronic systolic heart failure and CKD, last creatinine was 1.4.  He was hospitalized for a week in February for COPD exacerbation He had an office visit in January 2019 for COPD exacerbation  Acute visit today for shortness of breath and wheezing for 2 days, no sick contacts, no URI symptoms at onset, he worked out in the yard but seems to be the only trigger. His pedal edema had subsided after his hospitalization in February and is now returned.  He is compliant with Lasix daily he has hoarseness and saw ENT Jon White  and was told that his vocal cords were normal  He has a pulse oximeter and found his saturations to be around 88%, he wonders if he needs oxygen, sat 91% today  Meds - In the past ,incrusedid not work.Jon White was too expensive - he has  allergies to Spiriva and Breo     Significant tests/ events  PFTs 2014 FEV1 42%-1.08,significant bronchodilator response with improvement to 59%  NPSG 2008: AHI 11/hr, but no supine sleep.  Titration study: cpap 8cm.  Auto 06/2011: Optimal pressure 12cm.  PSG 01/2017 ([place]) RDI 12/hours-, predominant events during REM sleep with lowest desaturation 80% corrected by CPAP 13 cm,  Review of Systems neg for any significant sore throat, dysphagia, itching, sneezing, nasal congestion or excess/ purulent secretions, fever, chills, sweats, unintended wt loss, pleuritic or exertional cp, hempoptysis, orthopnea pnd   Also denies presyncope, palpitations, heartburn, abdominal pain, nausea, vomiting, diarrhea or change in bowel or urinary habits, dysuria,hematuria, rash, arthralgias, visual  complaints, headache, numbness weakness or ataxia.     Objective:   Physical Exam  Gen. Pleasant, well-nourished, in no distress ENT - no thrush, no post nasal drip Neck: No JVD, no thyromegaly, no carotid bruits Lungs: no use of accessory muscles, no dullness to percussion, BL exp diffuse rhonchi  Cardiovascular: Rhythm regular, heart sounds  normal, no murmurs or gallops, 2+ peripheral edema Musculoskeletal: No deformities, no cyanosis or clubbing        Assessment & Plan:

## 2017-10-19 ENCOUNTER — Ambulatory Visit
Admission: RE | Admit: 2017-10-19 | Discharge: 2017-10-19 | Disposition: A | Discharge status: Home / Self Care | Payer: Medicare Other | Source: Ambulatory Visit | Attending: Neurosurgery | Admitting: Neurosurgery

## 2017-10-19 DIAGNOSIS — M869 Osteomyelitis, unspecified: Secondary | ICD-10-CM

## 2017-10-19 DIAGNOSIS — G543 Thoracic root disorders, not elsewhere classified: Secondary | ICD-10-CM | POA: Diagnosis not present

## 2017-10-26 DIAGNOSIS — M869 Osteomyelitis, unspecified: Secondary | ICD-10-CM | POA: Diagnosis not present

## 2017-10-26 DIAGNOSIS — I10 Essential (primary) hypertension: Secondary | ICD-10-CM | POA: Diagnosis not present

## 2017-10-26 DIAGNOSIS — Z6825 Body mass index (BMI) 25.0-25.9, adult: Secondary | ICD-10-CM | POA: Diagnosis not present

## 2017-11-04 ENCOUNTER — Ambulatory Visit (INDEPENDENT_AMBULATORY_CARE_PROVIDER_SITE_OTHER): Payer: Medicare Other | Admitting: Podiatry

## 2017-11-04 ENCOUNTER — Encounter: Payer: Self-pay | Admitting: Podiatry

## 2017-11-04 DIAGNOSIS — E1151 Type 2 diabetes mellitus with diabetic peripheral angiopathy without gangrene: Secondary | ICD-10-CM | POA: Diagnosis not present

## 2017-11-04 DIAGNOSIS — B351 Tinea unguium: Secondary | ICD-10-CM

## 2017-11-07 ENCOUNTER — Ambulatory Visit (INDEPENDENT_AMBULATORY_CARE_PROVIDER_SITE_OTHER): Payer: Medicare Other | Admitting: Adult Health

## 2017-11-07 ENCOUNTER — Encounter: Payer: Self-pay | Admitting: Adult Health

## 2017-11-07 DIAGNOSIS — J449 Chronic obstructive pulmonary disease, unspecified: Secondary | ICD-10-CM

## 2017-11-07 DIAGNOSIS — I2510 Atherosclerotic heart disease of native coronary artery without angina pectoris: Secondary | ICD-10-CM | POA: Diagnosis not present

## 2017-11-07 DIAGNOSIS — G4733 Obstructive sleep apnea (adult) (pediatric): Secondary | ICD-10-CM

## 2017-11-07 DIAGNOSIS — I2782 Chronic pulmonary embolism: Secondary | ICD-10-CM

## 2017-11-07 NOTE — Assessment & Plan Note (Signed)
Doing well on CPAP, no changes

## 2017-11-07 NOTE — Progress Notes (Signed)
@Patient  ID: Jon White, male    DOB: [birth year], 84 y.o.   MRN: [id number]  Chief Complaint  Patient presents with  . 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  Titration study: cpap 8cm.  Auto 06/2011: Optimal pressure 12cm.  PSG 01/2017([place])RDI 12/hours-, predominant events during REM sleep with lowest desaturation 80% corrected by CPAP 13 cm,  Pneumovax 2016.  Prevnar 13 2018   11/07/2017 Follow up: COPD and OSA  Patient returns for a 3-week follow-up.  Patient has underlying COPD.  Last visit was treated for COPD exacerbation with a prednisone taper .  Patient remains on Symbicort twice daily and DuoNeb 4 times daily.  .Patient says he is feeling Better with decreased cough and congestion.  Patient feels like he is returned back to baseline.  Patient has underlying sleep apnea.  Remains on CPAP at bedtime says he is doing well on this.  No daytime sleepiness. .  Going up [place] to [place] to see Family , will be back in October.     Allergies  Allergen Reactions  . Mometasone Furoate Other (See Comments)    Nasonex: CAUSED RED,ITCHY EYES; "lost my eyelashes"   . Naproxen Sodium Nausea And Vomiting  . Nsaids Other (See Comments)    Stomach ulcers  . Anoro Ellipta [Umeclidinium-Vilanterol] Other (See Comments)    Double vision  . Aspirin Hives and Nausea And Vomiting     Chills, sweats  . Bevespi Aerosphere [Glycopyrrolate-Formoterol] Hives  . Pulmicort [Budesonide] Hives  .  Breo Ellipta [Fluticasone Furoate-Vilanterol] Hives  . Dexilant [Dexlansoprazole] Diarrhea    Cramps   . Omnaris [Ciclesonide] Other (See Comments)    Nose bleed   . Spiriva Handihaler [Tiotropium Bromide Monohydrate] Other (See Comments)    Hiccups     Immunization History  Administered Date(s) Administered  . Influenza Split 02/26/2013, 02/21/2014, 02/04/2015  . Influenza Whole 03/31/2012  . Influenza,inj,Quad PF,6+ Mos 02/04/2016  . Influenza-Unspecified 02/07/2017  . Pneumococcal Polysaccharide-23 08/30/2003, 07/30/2014    Past Medical History:  Diagnosis Date  . Allergic rhinitis   . Allergy   . Asthma   . Atrial fibrillation ([place])   . Bleeding gastric ulcer   . Blood transfusion without reported diagnosis   . CAD (coronary artery disease) 07/24/2017  . Cataract    bilateral removed  . Chronic combined systolic and diastolic congestive heart failure ([place])   . Chronic renal insufficiency, stage III (moderate) (HCC)   . COPD (chronic obstructive pulmonary disease) ([place])   . Diabetes mellitus without complication ([place])    no meds- boarderline  . Diastolic heart failure ([place])   . Duodenum ulcer    with perforation  . DVT of axillary vein, acute left ([place]) 10/24/2014  . Emphysema   . GERD (gastroesophageal reflux disease)   . H/O hiatal hernia   . High cholesterol   . Hypertension   . Mixed hyperlipidemia   . Myocardial infarction ([place])   . Neuromuscular disorder ([place])    reports shaking, somedays "can't hold spoon" - generalized  shakiness, is going  to talk to MD about it at next appt.   . Normal cardiac stress test    reports stress test was several yrs. ago /w [employer]. Dr. Rogelia Mire, WNL   . OSA (obstructive sleep apnea) 2007   CPAP- report of settings on paper chart, seen by [facility] - seen 07/2011   . PE (pulmonary embolism) 10/2011  . Prostate cancer ([place]) 1992   in remission.  s/p XRT, melanoma under left eye, left temple  . Recurrent upper respiratory  infection (URI)    coughing from chronic sinusitis   . Renal stone    some passed spontaneous, & also had cystoscopy  . Shortness of breath on exertion   . Sleep apnea    wears c-pap    Tobacco History: Social History   Tobacco Use  Smoking Status Former Smoker  . Packs/day: 1.00  . Years: 60.00  . Pack years: 60.00  . Types: Cigarettes  . Last attempt to quit: 08/06/2011  . Years since quitting: 6.2  Smokeless Tobacco Never Used   Counseling given: Not Answered   Outpatient Encounter Medications as of 11/07/2017  Medication Sig  . albuterol (PROVENTIL HFA;VENTOLIN HFA) 108 (90 Base) MCG/ACT inhaler INHALE 2 PUFFS BY MOUTH EVERY 6 HOURS AS NEEDED FOR WHEEZING OR SHORTNESS OF BREATH  . albuterol (PROVENTIL) (2.5 MG/3ML) 0.083% nebulizer solution Take 2.5 mg by nebulization 4 (four) times daily.  [name] Kitchen apixaban (ELIQUIS) 5 MG TABS tablet TAKE 1 TABLET(5 MG) BY MOUTH TWICE DAILY  . atorvastatin (LIPITOR) 10 MG tablet Take 10 mg by mouth daily at 6 PM.   . esomeprazole (NEXIUM) 40 MG capsule Take 1 capsule (40 mg total) by mouth daily.  . ferrous sulfate 220 (44 Fe) MG/5ML solution Take 220 mg by mouth every other day.  . fluocinonide cream (LIDEX) 7.40 % Apply 1 application topically daily as needed (rash). To infected area.  . fluticasone (FLONASE) 50 MCG/ACT nasal spray Place 2 sprays into the nose at bedtime. Each nostril once a day.  . furosemide (LASIX) 40 MG tablet TAKE 1 TABLET(40 MG) BY MOUTH DAILY  . ipratropium (ATROVENT) 0.02 % nebulizer solution Take 2.5 mLs (0.5 mg total) by nebulization 4 (four) times daily.  [name] Kitchen ketoconazole (NIZORAL) 2 % cream Apply 1 fingertip amount to bottom of each foot and toenails daily  . metoprolol tartrate (LOPRESSOR) 25 MG tablet Take 0.5 tablets (12.5 mg total) by mouth 2 (two) times daily.  . nitroGLYCERIN (NITROSTAT) 0.4 MG SL tablet Place 1 tablet (0.4 mg total) under the tongue every 5 (five) minutes as needed for chest pain.  . SYMBICORT  160-4.5 MCG/ACT inhaler INHALE 2 PUFFS INTO THE LUNGS TWICE DAILY  . tamsulosin (FLOMAX) 0.4 MG CAPS capsule Take 0.4 mg by mouth daily.  . [DISCONTINUED] predniSONE (DELTASONE) 10 MG tablet 4 tablets daily with food x4 days, then 3 tablets x4 days, then 2 tablets x4 days, then 1 tablet x4 days, then stop (Patient not taking: Reported on 11/07/2017)   No facility-administered encounter medications on file as of 11/07/2017.      Review of Systems  Constitutional:   No  weight loss, night sweats,  Fevers, chills, fatigue, or  lassitude.  HEENT:   No headaches,  Difficulty swallowing,  Tooth/dental problems, or  Sore throat,                No sneezing, itching, ear ache, nasal congestion, post nasal drip,   CV:  No chest pain,  Orthopnea, PND, ,  anasarca, dizziness, palpitations, syncope.   GI  No heartburn, indigestion, abdominal pain, nausea, vomiting, diarrhea, change in bowel habits, loss of appetite, bloody stools.   Resp:No chest wall deformity  Skin: no rash or lesions.  GU: no dysuria, change in color of urine, no urgency or frequency.  No flank pain, no hematuria   MS:  No joint pain or swelling.  No decreased range of motion.  No back pain.    Physical Exam  BP 104/60 (BP Location: Left Arm, Cuff Size: Normal)   Pulse 69   Ht 5\' 8"  (1.727 m)   Wt 166 lb (75.3 kg)   SpO2 97%   BMI 25.24 kg/m   GEN: A/Ox3; pleasant , NAD,    HEENT:  [place]/AT,  EACs-clear, TMs-wnl, NOSE-clear, THROAT-clear, no lesions, no postnasal drip or exudate noted.   NECK:  Supple w/ fair ROM; no JVD; normal carotid impulses w/o bruits; no thyromegaly or nodules palpated; no lymphadenopathy.     RESP  Decreased BS in bases no accessory muscle use, no dullness to percussion  CARD:  RRR, no m/r/g, tr  peripheral edema, pulses intact, no cyanosis or clubbing.  GI:   Soft & nt; nml bowel sounds; no organomegaly or masses detected.   Musco: Warm bil, no deformities or joint swelling noted.    Neuro: alert, no focal deficits noted.    Skin: Warm, no lesions or rashes    Lab Results:  CBC   BNP  Imaging:    Assessment & Plan:   COPD (chronic obstructive pulmonary disease) Recent COPD exacerbation now improved after steroids  Plan  Patient Instructions  Continue on Symbicort 2 puffs twice daily, rinse after use Continue on DuoNeb nebulizer 4 times daily Continue on CPAP at bedtime Follow-up in 4 months with Dr. Elsworth Soho and as needed  Enjoy your trip up [place] .       Chronic pulmonary embolism (HCC) Of recurrent DVT and PE continue on Eliquis  OSA (obstructive sleep apnea) Doing well on CPAP, no changes     Rexene Edison, NP 11/07/2017

## 2017-11-07 NOTE — Assessment & Plan Note (Signed)
Of recurrent DVT and PE continue on Eliquis

## 2017-11-07 NOTE — Patient Instructions (Addendum)
Continue on Symbicort 2 puffs twice daily, rinse after use Continue on DuoNeb nebulizer 4 times daily Continue on CPAP at bedtime Follow-up in 4 months with Dr. Elsworth Soho and as needed  Enjoy your trip up [place] .

## 2017-11-07 NOTE — Assessment & Plan Note (Signed)
Recent COPD exacerbation now improved after steroids  Plan  Patient Instructions  Continue on Symbicort 2 puffs twice daily, rinse after use Continue on DuoNeb nebulizer 4 times daily Continue on CPAP at bedtime Follow-up in 4 months with Dr. Elsworth Soho and as needed  Enjoy your trip up [place] .

## 2017-11-08 ENCOUNTER — Telehealth: Payer: Self-pay | Admitting: Podiatry

## 2017-11-08 NOTE — Telephone Encounter (Signed)
I saw Dr. March Rummage last week and he was supposed to call in the ketoconazole 2% cream. He still has not done that yet. I use the [facility] in [place]. Thank you.

## 2017-11-09 ENCOUNTER — Other Ambulatory Visit: Payer: Self-pay

## 2017-11-09 MED ORDER — KETOCONAZOLE 2 % EX CREA: TOPICAL_CREAM | CUTANEOUS | 0 refills | Status: DC

## 2017-11-09 NOTE — Telephone Encounter (Signed)
Done

## 2017-11-27 NOTE — Progress Notes (Signed)
Subjective:  Patient ID: [place], male    DOB: [birth year],  MRN: [id number]  Chief Complaint  Patient presents with  . Nail Problem    trim   84 y.o. male presents for diabetic foot care.  Still on Eliquis.  Past Medical History:  Diagnosis Date  . Allergic rhinitis   . Allergy   . Asthma   . Atrial fibrillation ([place])   . Bleeding gastric ulcer   . Blood transfusion without reported diagnosis   . CAD (coronary artery disease) 07/24/2017  . Cataract    bilateral removed  . Chronic combined systolic and diastolic congestive heart failure ([place])   . Chronic renal insufficiency, stage III (moderate) (HCC)   . COPD (chronic obstructive pulmonary disease) ([place])   . Diabetes mellitus without complication ([place])    no meds- boarderline  . Diastolic heart failure ([place])   . Duodenum ulcer    with perforation  . DVT of axillary vein, acute left ([place]) 10/24/2014  . Emphysema   . GERD (gastroesophageal reflux disease)   . H/O hiatal hernia   . High cholesterol   . Hypertension   . Mixed hyperlipidemia   . Myocardial infarction ([place])   . Neuromuscular disorder ([place])    reports shaking, somedays "can't hold spoon" - generalized  shakiness, is going to talk to MD about it at next appt.   . Normal cardiac stress test    reports stress test was several yrs. ago /w [employer]. Dr. Rogelia Mire, WNL   . OSA (obstructive sleep apnea) 2007   CPAP- report of settings on paper chart, seen by [facility] - seen 07/2011   . PE (pulmonary embolism) 10/2011  . Prostate cancer ([place]) 1992   in remission.  s/p XRT, melanoma under left eye, left temple  . Recurrent upper respiratory infection (URI)    coughing from chronic sinusitis   . Renal stone    some passed spontaneous, & also had cystoscopy  . Shortness of breath on exertion   . Sleep apnea    wears c-pap   Past Surgical History:  Procedure Laterality Date  . CATARACT EXTRACTION W/ INTRAOCULAR LENS  IMPLANT, BILATERAL  ~ 2000  .  COLONOSCOPY N/A 04/03/2014   Procedure: COLONOSCOPY;  Surgeon: Inda Castle, MD;  Location: [facility];  Service: Endoscopy;  Laterality: N/A;  . ESOPHAGOGASTRODUODENOSCOPY N/A 08/28/2013   Procedure: ESOPHAGOGASTRODUODENOSCOPY (EGD);  Surgeon: Gayland Curry, MD;  Location: [facility];  Service: General;  Laterality: N/A;  . ESOPHAGOGASTRODUODENOSCOPY N/A 04/03/2014   Procedure: ESOPHAGOGASTRODUODENOSCOPY (EGD);  Surgeon: Inda Castle, MD;  Location: [facility];  Service: Endoscopy;  Laterality: N/A;  . GASTROSTOMY N/A 08/28/2013   Procedure: GASTROSTOMY;  Surgeon: Rolm Bookbinder, MD;  Location: [facility];  Service: General;  Laterality: N/A;  . LAPAROTOMY N/A 08/28/2013   Procedure: EXPLORATORY LAPAROTOMY;  Surgeon: Rolm Bookbinder, MD;  Location: [facility];  Service: General;  Laterality: N/A;  . LOBECTOMY Right 1986   1/3 removed   . LUNG SURGERY  1986   presumed from RLL. Segmentectomy. Benign Nodule.   [name] Kitchen MAXILLARY ANTROSTOMY  09/15/2011   Procedure: MAXILLARY ANTROSTOMY;  Surgeon: Ascencion Dike, MD;  Location: [facility] OR;  Service: ENT;  Laterality: Bilateral;  TOTAL MAXILLARY ANTROSTOMY  . MELANOMA EXCISION Left    under left eye  . REPAIR OF PERFORATED ULCER N/A 08/28/2013   Procedure: DUODENAL ULCER PATCH;  Surgeon: Rolm Bookbinder, MD;  Location: [facility];  Service: General;  Laterality: N/A;  .  SEPTOPLASTY  09/15/11  . SEPTOPLASTY  09/15/2011   Procedure: SEPTOPLASTY;  Surgeon: Ascencion Dike, MD;  Location: [facility];  Service: ENT;  Laterality: Bilateral;  . SINUS ENDO W/FUSION  sch. 09/15/2011   Procedure: ENDOSCOPIC SINUS SURGERY WITH FUSION NAVIGATION;  Surgeon: Ascencion Dike, MD;  Location: [facility];  Service: ENT;  Laterality: Bilateral;  bilateral maxillary antrostomy, bilateral endoscopic ethmoidectomy, bilateral sphenoidectomy, bilateral frontal recess exploration  . SINUS ENDO W/FUSION  09/15/2011   Procedure: ENDOSCOPIC SINUS SURGERY WITH FUSION NAVIGATION;  Surgeon: Ascencion Dike,  MD;  Location: [facility];  Service: ENT;  Laterality: Bilateral;  . SINUS EXPLORATION  09/15/2011   Procedure: SINUS EXPLORATION;  Surgeon: Ascencion Dike, MD;  Location: [facility];  Service: ENT;  Laterality: Bilateral;  FRONTAL RECESS EXPLORATION  . SPHENOIDECTOMY  09/15/2011   Procedure: Coralee Pesa;  Surgeon: Ascencion Dike, MD;  Location: [facility];  Service: ENT;  Laterality: Bilateral;  . URETEROSCOPY  1980's    Current Outpatient Medications:  .  albuterol (PROVENTIL HFA;VENTOLIN HFA) 108 (90 Base) MCG/ACT inhaler, INHALE 2 PUFFS BY MOUTH EVERY 6 HOURS AS NEEDED FOR WHEEZING OR SHORTNESS OF BREATH, Disp: 8.5 g, Rfl: 3 .  albuterol (PROVENTIL) (2.5 MG/3ML) 0.083% nebulizer solution, Take 2.5 mg by nebulization 4 (four) times daily., Disp: , Rfl:  .  apixaban (ELIQUIS) 5 MG TABS tablet, TAKE 1 TABLET(5 MG) BY MOUTH TWICE DAILY, Disp: 4 tablet, Rfl: 0 .  atorvastatin (LIPITOR) 10 MG tablet, Take 10 mg by mouth daily at 6 PM. , Disp: , Rfl:  .  esomeprazole (NEXIUM) 40 MG capsule, Take 1 capsule (40 mg total) by mouth daily., Disp: 90 capsule, Rfl: 6 .  ferrous sulfate 220 (44 Fe) MG/5ML solution, Take 220 mg by mouth every other day., Disp: , Rfl:  .  fluocinonide cream (LIDEX) 6.30 %, Apply 1 application topically daily as needed (rash). To infected area., Disp: , Rfl:  .  fluticasone (FLONASE) 50 MCG/ACT nasal spray, Place 2 sprays into the nose at bedtime. Each nostril once a day., Disp: , Rfl:  .  furosemide (LASIX) 40 MG tablet, TAKE 1 TABLET(40 MG) BY MOUTH DAILY, Disp: 90 tablet, Rfl: 3 .  ipratropium (ATROVENT) 0.02 % nebulizer solution, Take 2.5 mLs (0.5 mg total) by nebulization 4 (four) times daily., Disp: 120 mL, Rfl: 12 .  ketoconazole (NIZORAL) 2 % cream, Apply 1 fingertip amount to bottom of each foot and toenails daily, Disp: 30 g, Rfl: 0 .  metoprolol tartrate (LOPRESSOR) 25 MG tablet, Take 0.5 tablets (12.5 mg total) by mouth 2 (two) times daily., Disp: 90 tablet, Rfl: 3 .  nitroGLYCERIN  (NITROSTAT) 0.4 MG SL tablet, Place 1 tablet (0.4 mg total) under the tongue every 5 (five) minutes as needed for chest pain., Disp: 75 tablet, Rfl: 3 .  SYMBICORT 160-4.5 MCG/ACT inhaler, INHALE 2 PUFFS INTO THE LUNGS TWICE DAILY, Disp: 10.2 g, Rfl: 3 .  tamsulosin (FLOMAX) 0.4 MG CAPS capsule, Take 0.4 mg by mouth daily., Disp: , Rfl:   Allergies  Allergen Reactions  . Mometasone Furoate Other (See Comments)    Nasonex: CAUSED RED,ITCHY EYES; "lost my eyelashes"   . Naproxen Sodium Nausea And Vomiting  . Nsaids Other (See Comments)    Stomach ulcers  . Anoro Ellipta [Umeclidinium-Vilanterol] Other (See Comments)    Double vision  . Aspirin Hives and Nausea And Vomiting     Chills, sweats  . Bevespi Aerosphere [Glycopyrrolate-Formoterol] Hives  .  Pulmicort [Budesonide] Hives  . Breo Ellipta [Fluticasone Furoate-Vilanterol] Hives  . Dexilant [Dexlansoprazole] Diarrhea    Cramps   . Omnaris [Ciclesonide] Other (See Comments)    Nose bleed   . Spiriva Handihaler [Tiotropium Bromide Monohydrate] Other (See Comments)    Hiccups      Objective:   General AA&O x3. Normal mood and affect.  Vascular Dorsalis pedis pulses present 1+ bilaterally  Posterior tibial pulses absent bilaterally  Capillary refill normal to all digits. Pedal hair growth normal.  Neurologic Epicritic sensation present bilaterally. Protective sensation with 5.07 monofilament  present bilaterally. Vibratory sensation present bilaterally.  Dermatologic No open lesions. Interspaces clear of maceration.  Normal skin temperature and turgor. Hyperkeratotic lesions: None bilaterally. Nails: brittle, onychomycosis, thickening, elongation  Orthopedic: No history of amputation. MMT 5/5 in dorsiflexion, plantarflexion, inversion, and eversion. Normal lower extremity joint ROM without pain or crepitus.    Assessment & Plan:  Patient was evaluated and treated and all questions answered.  Diabetes with PAD,  Onychomycosis, coagulation defect -Educated on diabetic footcare. Diabetic risk level 1 -At risk foot care provided as below.   -Will order ABIs  Procedure: Nail Debridement Rationale: Patient meets criteria for routine foot care due to  coagulation defect, class B findings Type of Debridement: manual, sharp debridement. Instrumentation: Nail nipper, rotary burr. Number of Nails: 10  Return in about 3 months (around 02/04/2018) for Diabetic Foot Care.

## 2017-12-21 ENCOUNTER — Other Ambulatory Visit: Payer: Self-pay

## 2017-12-21 NOTE — Patient Outreach (Signed)
[facility]) Care Management  12/21/2017  [place] White 21-Dec-1932 [id number]  The patient has not responded to calls or letter.   Plan: RN Health Coach will proceed with case closure due to unable to contact.   [name] RN, BSN, [place] Direct Dial:  [phone]  Fax: [phone]

## 2017-12-29 DIAGNOSIS — G4733 Obstructive sleep apnea (adult) (pediatric): Secondary | ICD-10-CM | POA: Diagnosis not present

## 2017-12-29 DIAGNOSIS — R0600 Dyspnea, unspecified: Secondary | ICD-10-CM | POA: Diagnosis not present

## 2017-12-29 DIAGNOSIS — K219 Gastro-esophageal reflux disease without esophagitis: Secondary | ICD-10-CM | POA: Diagnosis not present

## 2017-12-29 DIAGNOSIS — E119 Type 2 diabetes mellitus without complications: Secondary | ICD-10-CM | POA: Diagnosis not present

## 2017-12-29 DIAGNOSIS — N183 Chronic kidney disease, stage 3 (moderate): Secondary | ICD-10-CM | POA: Diagnosis not present

## 2017-12-29 DIAGNOSIS — I82409 Acute embolism and thrombosis of unspecified deep veins of unspecified lower extremity: Secondary | ICD-10-CM | POA: Diagnosis not present

## 2017-12-29 DIAGNOSIS — K254 Chronic or unspecified gastric ulcer with hemorrhage: Secondary | ICD-10-CM | POA: Diagnosis not present

## 2017-12-29 DIAGNOSIS — T39015A Adverse effect of aspirin, initial encounter: Secondary | ICD-10-CM | POA: Diagnosis not present

## 2017-12-29 DIAGNOSIS — J449 Chronic obstructive pulmonary disease, unspecified: Secondary | ICD-10-CM | POA: Diagnosis not present

## 2017-12-29 DIAGNOSIS — E789 Disorder of lipoprotein metabolism, unspecified: Secondary | ICD-10-CM | POA: Diagnosis not present

## 2017-12-29 DIAGNOSIS — J45909 Unspecified asthma, uncomplicated: Secondary | ICD-10-CM | POA: Diagnosis not present

## 2017-12-29 DIAGNOSIS — I2699 Other pulmonary embolism without acute cor pulmonale: Secondary | ICD-10-CM | POA: Diagnosis not present

## 2018-01-02 ENCOUNTER — Other Ambulatory Visit: Payer: Self-pay | Admitting: Internal Medicine

## 2018-01-20 DIAGNOSIS — Z961 Presence of intraocular lens: Secondary | ICD-10-CM | POA: Diagnosis not present

## 2018-01-20 DIAGNOSIS — H353122 Nonexudative age-related macular degeneration, left eye, intermediate dry stage: Secondary | ICD-10-CM | POA: Diagnosis not present

## 2018-01-20 DIAGNOSIS — H1132 Conjunctival hemorrhage, left eye: Secondary | ICD-10-CM | POA: Diagnosis not present

## 2018-01-20 DIAGNOSIS — H04123 Dry eye syndrome of bilateral lacrimal glands: Secondary | ICD-10-CM | POA: Diagnosis not present

## 2018-01-31 DIAGNOSIS — J45909 Unspecified asthma, uncomplicated: Secondary | ICD-10-CM | POA: Diagnosis not present

## 2018-01-31 DIAGNOSIS — J449 Chronic obstructive pulmonary disease, unspecified: Secondary | ICD-10-CM | POA: Diagnosis not present

## 2018-01-31 DIAGNOSIS — T39015A Adverse effect of aspirin, initial encounter: Secondary | ICD-10-CM | POA: Diagnosis not present

## 2018-01-31 DIAGNOSIS — I82409 Acute embolism and thrombosis of unspecified deep veins of unspecified lower extremity: Secondary | ICD-10-CM | POA: Diagnosis not present

## 2018-01-31 DIAGNOSIS — E119 Type 2 diabetes mellitus without complications: Secondary | ICD-10-CM | POA: Diagnosis not present

## 2018-01-31 DIAGNOSIS — R0600 Dyspnea, unspecified: Secondary | ICD-10-CM | POA: Diagnosis not present

## 2018-01-31 DIAGNOSIS — K219 Gastro-esophageal reflux disease without esophagitis: Secondary | ICD-10-CM | POA: Diagnosis not present

## 2018-01-31 DIAGNOSIS — N183 Chronic kidney disease, stage 3 (moderate): Secondary | ICD-10-CM | POA: Diagnosis not present

## 2018-01-31 DIAGNOSIS — E789 Disorder of lipoprotein metabolism, unspecified: Secondary | ICD-10-CM | POA: Diagnosis not present

## 2018-01-31 DIAGNOSIS — K254 Chronic or unspecified gastric ulcer with hemorrhage: Secondary | ICD-10-CM | POA: Diagnosis not present

## 2018-01-31 DIAGNOSIS — I2699 Other pulmonary embolism without acute cor pulmonale: Secondary | ICD-10-CM | POA: Diagnosis not present

## 2018-01-31 DIAGNOSIS — G4733 Obstructive sleep apnea (adult) (pediatric): Secondary | ICD-10-CM | POA: Diagnosis not present

## 2018-02-03 DIAGNOSIS — Z23 Encounter for immunization: Secondary | ICD-10-CM | POA: Diagnosis not present

## 2018-03-02 ENCOUNTER — Other Ambulatory Visit: Payer: Self-pay | Admitting: *Deleted

## 2018-03-02 MED ORDER — ALBUTEROL SULFATE HFA 108 (90 BASE) MCG/ACT IN AERS: INHALATION_SPRAY | RESPIRATORY_TRACT | 3 refills | Status: DC

## 2018-03-03 DIAGNOSIS — S4992XA Unspecified injury of left shoulder and upper arm, initial encounter: Secondary | ICD-10-CM | POA: Diagnosis not present

## 2018-03-22 ENCOUNTER — Telehealth: Payer: Self-pay | Admitting: Pulmonary Disease

## 2018-03-22 DIAGNOSIS — G4733 Obstructive sleep apnea (adult) (pediatric): Secondary | ICD-10-CM

## 2018-03-22 NOTE — Telephone Encounter (Signed)
His last study was [place] where his daughter works in a sleep lab Send Rx for  CPAP 13 cm - OV in 6wks with NP

## 2018-03-22 NOTE — Telephone Encounter (Signed)
Dr. Elsworth Soho if pt is eligible for a new CPAP machine, can we send order to [facility]? Please advise. Pt advised we are waiting for RA approval. Last setting was on 10cm, order pended.   Assessment & Plan:   COPD (chronic obstructive pulmonary disease) Recent COPD exacerbation now improved after steroids  Plan  Patient Instructions  Continue on Symbicort 2 puffs twice daily, rinse after use Continue on DuoNeb nebulizer 4 times daily Continue on CPAP at bedtime Follow-up in 4 months with Dr. Elsworth Soho and as needed  Enjoy your trip up [place] .       Chronic pulmonary embolism (HCC) Of recurrent DVT and PE continue on Eliquis  OSA (obstructive sleep apnea) Doing well on CPAP, no changes

## 2018-03-23 ENCOUNTER — Ambulatory Visit (INDEPENDENT_AMBULATORY_CARE_PROVIDER_SITE_OTHER): Payer: Medicare Other | Admitting: Podiatry

## 2018-03-23 ENCOUNTER — Encounter: Payer: Self-pay | Admitting: Podiatry

## 2018-03-23 DIAGNOSIS — D689 Coagulation defect, unspecified: Secondary | ICD-10-CM | POA: Diagnosis not present

## 2018-03-23 DIAGNOSIS — E1151 Type 2 diabetes mellitus with diabetic peripheral angiopathy without gangrene: Secondary | ICD-10-CM | POA: Diagnosis not present

## 2018-03-23 DIAGNOSIS — B351 Tinea unguium: Secondary | ICD-10-CM

## 2018-03-23 NOTE — Telephone Encounter (Signed)
Spoke with the pt and notified of recs per RA  He verbalized understanding  Order sent to [facility] and the appt for f/u scheduled

## 2018-03-28 ENCOUNTER — Other Ambulatory Visit: Payer: Self-pay | Admitting: Neurosurgery

## 2018-03-28 DIAGNOSIS — M869 Osteomyelitis, unspecified: Secondary | ICD-10-CM

## 2018-03-28 NOTE — Progress Notes (Signed)
Subjective:  Patient ID: Jon White, male    DOB: 1932/06/16,  MRN: [id number]  Chief Complaint  Patient presents with  . Diabetic Foot Care    exam/bilateral nail debridement   84 y.o. male presents for diabetic foot care.  Still on Eliquis.   Past Medical History:  Diagnosis Date  . Allergic rhinitis   . Allergy   . Asthma   . Atrial fibrillation ([place])   . Bleeding gastric ulcer   . Blood transfusion without reported diagnosis   . CAD (coronary artery disease) 07/24/2017  . Cataract    bilateral removed  . Chronic combined systolic and diastolic congestive heart failure ([place])   . Chronic renal insufficiency, stage III (moderate) (HCC)   . COPD (chronic obstructive pulmonary disease) ([place])   . Diabetes mellitus without complication ([place])    no meds- boarderline  . Diastolic heart failure ([place])   . Duodenum ulcer    with perforation  . DVT of axillary vein, acute left ([place]) 10/24/2014  . Emphysema   . GERD (gastroesophageal reflux disease)   . H/O hiatal hernia   . High cholesterol   . Hypertension   . Mixed hyperlipidemia   . Myocardial infarction ([place])   . Neuromuscular disorder ([place])    reports shaking, somedays "can't hold spoon" - generalized  shakiness, is going to talk to MD about it at next appt.   . Normal cardiac stress test    reports stress test was several yrs. ago /w [employer]. Dr. Rogelia Mire, WNL   . OSA (obstructive sleep apnea) 2007   CPAP- report of settings on paper chart, seen by [facility] - seen 07/2011   . PE (pulmonary embolism) 10/2011  . Prostate cancer ([place]) 1992   in remission.  s/p XRT, melanoma under left eye, left temple  . Recurrent upper respiratory infection (URI)    coughing from chronic sinusitis   . Renal stone    some passed spontaneous, & also had cystoscopy  . Shortness of breath on exertion   . Sleep apnea    wears c-pap   Past Surgical History:  Procedure Laterality Date  . CATARACT EXTRACTION W/ INTRAOCULAR LENS   IMPLANT, BILATERAL  ~ 2000  . COLONOSCOPY N/A 04/03/2014   Procedure: COLONOSCOPY;  Surgeon: Inda Castle, MD;  Location: [facility];  Service: Endoscopy;  Laterality: N/A;  . ESOPHAGOGASTRODUODENOSCOPY N/A 08/28/2013   Procedure: ESOPHAGOGASTRODUODENOSCOPY (EGD);  Surgeon: Gayland Curry, MD;  Location: [place];  Service: General;  Laterality: N/A;  . ESOPHAGOGASTRODUODENOSCOPY N/A 04/03/2014   Procedure: ESOPHAGOGASTRODUODENOSCOPY (EGD);  Surgeon: Inda Castle, MD;  Location: [facility];  Service: Endoscopy;  Laterality: N/A;  . GASTROSTOMY N/A 08/28/2013   Procedure: GASTROSTOMY;  Surgeon: Rolm Bookbinder, MD;  Location: [place];  Service: General;  Laterality: N/A;  . LAPAROTOMY N/A 08/28/2013   Procedure: EXPLORATORY LAPAROTOMY;  Surgeon: Rolm Bookbinder, MD;  Location: [place];  Service: General;  Laterality: N/A;  . LOBECTOMY Right 1986   1/3 removed   . LUNG SURGERY  1986   presumed from RLL. Segmentectomy. Benign Nodule.   [name] Kitchen MAXILLARY ANTROSTOMY  09/15/2011   Procedure: MAXILLARY ANTROSTOMY;  Surgeon: Ascencion Dike, MD;  [facility];  Service: ENT;  Laterality: Bilateral;  TOTAL MAXILLARY ANTROSTOMY  . MELANOMA EXCISION Left    under left eye  . REPAIR OF PERFORATED ULCER N/A 08/28/2013   Procedure: DUODENAL ULCER PATCH;  Surgeon: Rolm Bookbinder, MD;  Location: [place];  Service: General;  Laterality: N/A;  . SEPTOPLASTY  09/15/11  . SEPTOPLASTY  09/15/2011   Procedure: SEPTOPLASTY;  Surgeon: Ascencion Dike, MD;  Location: [place];  Service: ENT;  Laterality: Bilateral;  . SINUS ENDO W/FUSION  sch. 09/15/2011   Procedure: ENDOSCOPIC SINUS SURGERY WITH FUSION NAVIGATION;  Surgeon: Ascencion Dike, MD;  Location: [place];  Service: ENT;  Laterality: Bilateral;  bilateral maxillary antrostomy, bilateral endoscopic ethmoidectomy, bilateral sphenoidectomy, bilateral frontal recess exploration  . SINUS ENDO W/FUSION  09/15/2011   Procedure: ENDOSCOPIC SINUS SURGERY WITH FUSION  NAVIGATION;  Surgeon: Ascencion Dike, MD;  Location: [place];  Service: ENT;  Laterality: Bilateral;  . SINUS EXPLORATION  09/15/2011   Procedure: SINUS EXPLORATION;  Surgeon: Ascencion Dike, MD;  Location: [place];  Service: ENT;  Laterality: Bilateral;  FRONTAL RECESS EXPLORATION  . SPHENOIDECTOMY  09/15/2011   Procedure: Coralee Pesa;  Surgeon: Ascencion Dike, MD;  Location: [place];  Service: ENT;  Laterality: Bilateral;  . URETEROSCOPY  1980's    Current Outpatient Medications:  .  albuterol (PROVENTIL HFA;VENTOLIN HFA) 108 (90 Base) MCG/ACT inhaler, INHALE 2 PUFFS BY MOUTH EVERY 6 HOURS AS NEEDED FOR WHEEZING OR SHORTNESS OF BREATH, Disp: 8.5 g, Rfl: 3 .  albuterol (PROVENTIL) (2.5 MG/3ML) 0.083% nebulizer solution, Take 2.5 mg by nebulization 4 (four) times daily., Disp: , Rfl:  .  apixaban (ELIQUIS) 5 MG TABS tablet, TAKE 1 TABLET(5 MG) BY MOUTH TWICE DAILY, Disp: 4 tablet, Rfl: 0 .  atorvastatin (LIPITOR) 10 MG tablet, Take 10 mg by mouth daily at 6 PM. , Disp: , Rfl:  .  ELIQUIS 5 MG TABS tablet, TAKE 1 TABLET(5 MG) BY MOUTH TWICE DAILY, Disp: 180 tablet, Rfl: 0 .  esomeprazole (NEXIUM) 40 MG capsule, Take 1 capsule (40 mg total) by mouth daily., Disp: 90 capsule, Rfl: 6 .  ferrous sulfate 220 (44 Fe) MG/5ML solution, Take 220 mg by mouth every other day., Disp: , Rfl:  .  fluocinonide cream (LIDEX) 9.92 %, Apply 1 application topically daily as needed (rash). To infected area., Disp: , Rfl:  .  fluticasone (FLONASE) 50 MCG/ACT nasal spray, Place 2 sprays into the nose at bedtime. Each nostril once a day., Disp: , Rfl:  .  furosemide (LASIX) 40 MG tablet, TAKE 1 TABLET(40 MG) BY MOUTH DAILY, Disp: 90 tablet, Rfl: 3 .  ipratropium (ATROVENT) 0.02 % nebulizer solution, Take 2.5 mLs (0.5 mg total) by nebulization 4 (four) times daily., Disp: 120 mL, Rfl: 12 .  ketoconazole (NIZORAL) 2 % cream, Apply 1 fingertip amount to bottom of each foot and toenails daily, Disp: 30 g, Rfl: 0 .  metoprolol tartrate  (LOPRESSOR) 25 MG tablet, Take 0.5 tablets (12.5 mg total) by mouth 2 (two) times daily., Disp: 90 tablet, Rfl: 3 .  nitroGLYCERIN (NITROSTAT) 0.4 MG SL tablet, Place 1 tablet (0.4 mg total) under the tongue every 5 (five) minutes as needed for chest pain., Disp: 75 tablet, Rfl: 3 .  SYMBICORT 160-4.5 MCG/ACT inhaler, INHALE 2 PUFFS INTO THE LUNGS TWICE DAILY, Disp: 10.2 g, Rfl: 3 .  tamsulosin (FLOMAX) 0.4 MG CAPS capsule, Take 0.4 mg by mouth daily., Disp: , Rfl:   Allergies  Allergen Reactions  . Mometasone Furoate Other (See Comments)    Nasonex: CAUSED RED,ITCHY EYES; "lost my eyelashes"   . Naproxen Sodium Nausea And Vomiting  . Nsaids Other (See Comments)    Stomach ulcers  . Anoro Ellipta [Umeclidinium-Vilanterol] Other (See Comments)  Double vision  . Aspirin Hives and Nausea And Vomiting     Chills, sweats  . Bevespi Aerosphere [Glycopyrrolate-Formoterol] Hives  . Pulmicort [Budesonide] Hives  . Breo Ellipta [Fluticasone Furoate-Vilanterol] Hives  . Dexilant [Dexlansoprazole] Diarrhea    Cramps   . Omnaris [Ciclesonide] Other (See Comments)    Nose bleed   . Spiriva Handihaler [Tiotropium Bromide Monohydrate] Other (See Comments)    Hiccups      Objective:   General AA&O x3. Normal mood and affect.  Vascular Dorsalis pedis pulses present 1+ bilaterally  Posterior tibial pulses absent bilaterally  Capillary refill normal to all digits. Pedal hair growth normal.  Neurologic Epicritic sensation present bilaterally. Protective sensation with 5.07 monofilament  present bilaterally. Vibratory sensation present bilaterally.  Dermatologic No open lesions. Interspaces clear of maceration.  Normal skin temperature and turgor. Hyperkeratotic lesions: None bilaterally. Nails: brittle, onychomycosis, thickening, elongation  Orthopedic: No history of amputation. MMT 5/5 in dorsiflexion, plantarflexion, inversion, and eversion. Normal lower extremity joint ROM without pain  or crepitus.    Assessment & Plan:  Patient was evaluated and treated and all questions answered.  Diabetes with PAD, Onychomycosis, coagulation defect -Educated on diabetic footcare. Diabetic risk level 1 -At risk foot care provided as below.    Procedure: Nail Debridement Rationale: Patient meets criteria for routine foot care due to PAD, coagulation defect Type of Debridement: manual, sharp debridement. Instrumentation: Nail nipper, rotary burr. Number of Nails: 10     No follow-ups on file.

## 2018-03-29 ENCOUNTER — Other Ambulatory Visit: Payer: Medicare Other

## 2018-03-29 ENCOUNTER — Ambulatory Visit
Admission: RE | Admit: 2018-03-29 | Discharge: 2018-03-29 | Disposition: A | Discharge status: Home / Self Care | Payer: Medicare Other | Source: Ambulatory Visit | Attending: Neurosurgery | Admitting: Neurosurgery

## 2018-03-29 DIAGNOSIS — M869 Osteomyelitis, unspecified: Secondary | ICD-10-CM | POA: Diagnosis not present

## 2018-03-30 DIAGNOSIS — E782 Mixed hyperlipidemia: Secondary | ICD-10-CM | POA: Diagnosis not present

## 2018-03-30 DIAGNOSIS — R21 Rash and other nonspecific skin eruption: Secondary | ICD-10-CM | POA: Diagnosis not present

## 2018-04-04 DIAGNOSIS — C61 Malignant neoplasm of prostate: Secondary | ICD-10-CM | POA: Diagnosis not present

## 2018-04-06 DIAGNOSIS — I5042 Chronic combined systolic (congestive) and diastolic (congestive) heart failure: Secondary | ICD-10-CM | POA: Diagnosis not present

## 2018-04-06 DIAGNOSIS — J449 Chronic obstructive pulmonary disease, unspecified: Secondary | ICD-10-CM | POA: Diagnosis not present

## 2018-04-06 DIAGNOSIS — E782 Mixed hyperlipidemia: Secondary | ICD-10-CM | POA: Diagnosis not present

## 2018-04-06 DIAGNOSIS — E119 Type 2 diabetes mellitus without complications: Secondary | ICD-10-CM | POA: Diagnosis not present

## 2018-04-06 DIAGNOSIS — I2510 Atherosclerotic heart disease of native coronary artery without angina pectoris: Secondary | ICD-10-CM | POA: Diagnosis not present

## 2018-04-08 ENCOUNTER — Other Ambulatory Visit: Payer: Self-pay | Admitting: Internal Medicine

## 2018-04-11 DIAGNOSIS — N200 Calculus of kidney: Secondary | ICD-10-CM | POA: Diagnosis not present

## 2018-04-11 DIAGNOSIS — C61 Malignant neoplasm of prostate: Secondary | ICD-10-CM | POA: Diagnosis not present

## 2018-04-11 DIAGNOSIS — Q6211 Congenital occlusion of ureteropelvic junction: Secondary | ICD-10-CM | POA: Diagnosis not present

## 2018-04-11 DIAGNOSIS — R350 Frequency of micturition: Secondary | ICD-10-CM | POA: Diagnosis not present

## 2018-04-12 DIAGNOSIS — J310 Chronic rhinitis: Secondary | ICD-10-CM | POA: Diagnosis not present

## 2018-04-12 DIAGNOSIS — J330 Polyp of nasal cavity: Secondary | ICD-10-CM | POA: Diagnosis not present

## 2018-04-20 DIAGNOSIS — M869 Osteomyelitis, unspecified: Secondary | ICD-10-CM | POA: Diagnosis not present

## 2018-05-08 ENCOUNTER — Encounter: Payer: Self-pay | Admitting: Adult Health

## 2018-05-08 ENCOUNTER — Ambulatory Visit (INDEPENDENT_AMBULATORY_CARE_PROVIDER_SITE_OTHER): Payer: Medicare Other | Admitting: Adult Health

## 2018-05-08 VITALS — BP 126/70 | HR 66 | Wt 171.0 lb

## 2018-05-08 DIAGNOSIS — G4733 Obstructive sleep apnea (adult) (pediatric): Secondary | ICD-10-CM | POA: Diagnosis not present

## 2018-05-08 DIAGNOSIS — I2510 Atherosclerotic heart disease of native coronary artery without angina pectoris: Secondary | ICD-10-CM

## 2018-05-08 DIAGNOSIS — J449 Chronic obstructive pulmonary disease, unspecified: Secondary | ICD-10-CM | POA: Diagnosis not present

## 2018-05-08 DIAGNOSIS — J9611 Chronic respiratory failure with hypoxia: Secondary | ICD-10-CM | POA: Diagnosis not present

## 2018-05-08 MED ORDER — BUDESONIDE-FORMOTEROL FUMARATE 160-4.5 MCG/ACT IN AERO: 2.0000 | INHALATION_SPRAY | Freq: Two times a day (BID) | RESPIRATORY_TRACT | 5 refills | Status: DC

## 2018-05-08 MED ORDER — BUDESONIDE-FORMOTEROL FUMARATE 160-4.5 MCG/ACT IN AERO: 2.0000 | INHALATION_SPRAY | Freq: Two times a day (BID) | RESPIRATORY_TRACT | 0 refills | Status: DC

## 2018-05-08 NOTE — Progress Notes (Signed)
@Patient  ID: Jon White, male    DOB: 04/24/1933, 84 y.o.   MRN: [id number]  Chief Complaint  Patient presents with  . 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  Titration study: cpap 8cm.  Auto 06/2011: Optimal pressure 12cm.  PSG 01/2017([place])RDI 12/hours-, predominant events during REM sleep with lowest desaturation 80% corrected by CPAP 13 cm,  Pneumovax 2016.  Prevnar 13 2018   05/08/2018 Follow up : COPD and OSA  Patient presents for a 44-month follow-up for severe COPD.  He remains on Symbicort twice daily and DuoNeb 4 times daily.    Patient has underlying obstructive sleep apnea.  Is on CPAP.  He recently got a new machine.  Has been having trouble with his new machine making loud noises.  Have encouraged him to contact his homecare company regarding this.  Patient also feels that his CPAP pressure may be a little too high.  He is currently on CPAP 13 cm H2O.  Download shows excellent compliance with average usage at 9.5 hours.  AHI is 3.2/hour.  Minimum leaks. Patient feels rested with no significant daytime sleepiness.  Says he tries to be active. Mows grass on riding mower and blows leaves. Gets winded with heavy activity . Does notice some drops in oxygen when doing heavy activity .  Today in office Oxygen at 87% walking in office on room air . Walking O2 on 2l/m sats >90%.  At rest O2 sats 95% on room air.  We discussed starting O2 with activity .      Allergies  Allergen  Reactions  . Mometasone Furoate Other (See Comments)    Nasonex: CAUSED RED,ITCHY EYES; "lost my eyelashes"   . Naproxen Sodium Nausea And Vomiting  . Nsaids Other (See Comments)    Stomach ulcers  . Anoro Ellipta [Umeclidinium-Vilanterol] Other (See Comments)    Double vision  . Aspirin Hives and Nausea And Vomiting     Chills, sweats  . Bevespi Aerosphere [Glycopyrrolate-Formoterol] Hives  . Pulmicort [Budesonide] Hives  . Breo Ellipta [Fluticasone Furoate-Vilanterol] Hives  . Dexilant [Dexlansoprazole] Diarrhea    Cramps   . Omnaris [Ciclesonide] Other (See Comments)    Nose bleed   . Spiriva Handihaler [Tiotropium Bromide Monohydrate] Other (See Comments)    Hiccups     Immunization History  Administered Date(s) Administered  . Influenza Split 02/26/2013, 02/21/2014, 02/04/2015  . Influenza Whole 03/31/2012  . Influenza, High Dose Seasonal PF 02/03/2018  . Influenza,inj,Quad PF,6+ Mos 02/04/2016  . Influenza-Unspecified 02/07/2017  . Pneumococcal Polysaccharide-23 08/30/2003, 07/30/2014    Past Medical History:  Diagnosis Date  . Allergic rhinitis   . Allergy   . Asthma   . Atrial fibrillation ([place])   . Bleeding gastric ulcer   . Blood transfusion without reported diagnosis   . CAD (coronary artery disease) 07/24/2017  . Cataract    bilateral removed  . Chronic combined systolic and diastolic congestive heart failure ([place])   . Chronic renal insufficiency, stage III (moderate) (HCC)   . COPD (chronic obstructive pulmonary disease) ([place])   .  Diabetes mellitus without complication ([place])    no meds- boarderline  . Diastolic heart failure ([place])   . Duodenum ulcer    with perforation  . DVT of axillary vein, acute left ([place]) 10/24/2014  . Emphysema   . GERD (gastroesophageal reflux disease)   . H/O hiatal hernia   . High cholesterol   . Hypertension   . Mixed hyperlipidemia   . Myocardial infarction ([place])   . Neuromuscular disorder ([place])    reports shaking,  somedays "can't hold spoon" - generalized  shakiness, is going to talk to MD about it at next appt.   . Normal cardiac stress test    reports stress test was several yrs. ago /w [employer]. Dr. Rogelia Mire, WNL   . OSA (obstructive sleep apnea) 2007   CPAP- report of settings on paper chart, seen by [facility] - seen 07/2011   . PE (pulmonary embolism) 10/2011  . Prostate cancer ([place]) 1992   in remission.  s/p XRT, melanoma under left eye, left temple  . Recurrent upper respiratory infection (URI)    coughing from chronic sinusitis   . Renal stone    some passed spontaneous, & also had cystoscopy  . Shortness of breath on exertion   . Sleep apnea    wears c-pap    Tobacco History: Social History   Tobacco Use  Smoking Status Former Smoker  . Packs/day: 1.00  . Years: 60.00  . Pack years: 60.00  . Types: Cigarettes  . Last attempt to quit: 08/06/2011  . Years since quitting: 6.7  Smokeless Tobacco Never Used   Counseling given: Not Answered   Outpatient Medications Prior to Visit  Medication Sig Dispense Refill  . albuterol (PROVENTIL HFA;VENTOLIN HFA) 108 (90 Base) MCG/ACT inhaler INHALE 2 PUFFS BY MOUTH EVERY 6 HOURS AS NEEDED FOR WHEEZING OR SHORTNESS OF BREATH 8.5 g 3  . albuterol (PROVENTIL) (2.5 MG/3ML) 0.083% nebulizer solution Take 2.5 mg by nebulization 4 (four) times daily.    [name] Kitchen atorvastatin (LIPITOR) 10 MG tablet Take 10 mg by mouth daily at 6 PM.     . ELIQUIS 5 MG TABS tablet TAKE 1 TABLET(5 MG) BY MOUTH TWICE DAILY 180 tablet 0  . esomeprazole (NEXIUM) 40 MG capsule Take 1 capsule (40 mg total) by mouth daily. 90 capsule 6  . ferrous sulfate 220 (44 Fe) MG/5ML solution Take 220 mg by mouth every other day.    . fluocinonide cream (LIDEX) 0.93 % Apply 1 application topically daily as needed (rash). To infected area.    . fluticasone (FLONASE) 50 MCG/ACT nasal spray Place 2 sprays into the nose at bedtime. Each nostril once a day.    . furosemide (LASIX) 40 MG tablet TAKE  1 TABLET(40 MG) BY MOUTH DAILY 90 tablet 3  . ipratropium (ATROVENT) 0.02 % nebulizer solution Take 2.5 mLs (0.5 mg total) by nebulization 4 (four) times daily. 120 mL 12  . ketoconazole (NIZORAL) 2 % cream Apply 1 fingertip amount to bottom of each foot and toenails daily 30 g 0  . metoprolol tartrate (LOPRESSOR) 25 MG tablet Take 0.5 tablets (12.5 mg total) by mouth 2 (two) times daily. 90 tablet 3  . nitroGLYCERIN (NITROSTAT) 0.4 MG SL tablet Place 1 tablet (0.4 mg total) under the tongue every 5 (five) minutes as needed for chest pain. 75 tablet 3  . tamsulosin (FLOMAX) 0.4 MG CAPS capsule Take 0.4 mg by mouth daily.    . SYMBICORT 160-4.5 MCG/ACT inhaler INHALE 2 PUFFS INTO  THE LUNGS TWICE DAILY 10.2 g 0  . apixaban (ELIQUIS) 5 MG TABS tablet TAKE 1 TABLET(5 MG) BY MOUTH TWICE DAILY 4 tablet 0   No facility-administered medications prior to visit.      Review of Systems  Constitutional:   No  weight loss, night sweats,  Fevers, chills,  +fatigue, or  lassitude.  HEENT:   No headaches,  Difficulty swallowing,  Tooth/dental problems, or  Sore throat,                No sneezing, itching, ear ache, nasal congestion, post nasal drip,   CV:  No chest pain,  Orthopnea, PND, swelling in lower extremities, anasarca, dizziness, palpitations, syncope.   GI  No heartburn, indigestion, abdominal pain, nausea, vomiting, diarrhea, change in bowel habits, loss of appetite, bloody stools.   Resp:    No chest wall deformity  Skin: no rash or lesions.  GU: no dysuria, change in color of urine, no urgency or frequency.  No flank pain, no hematuria   MS:  No joint pain or swelling.  No decreased range of motion.  No back pain.    Physical Exam  BP 126/70 (BP Location: Left Arm, Cuff Size: Normal)   Pulse 66   Wt 171 lb (77.6 kg)   SpO2 97%   BMI 26.00 kg/m   GEN: A/Ox3; [place] , NAD, elderly ,    HEENT:  [place]/AT,  EACs-clear, TMs-wnl, NOSE-clear, THROAT-clear, no lesions, no postnasal  drip or exudate noted. Class 2-3 MP airway   NECK:  Supple w/ fair ROM; no JVD; normal carotid impulses w/o bruits; no thyromegaly or nodules palpated; no lymphadenopathy.    RESP  Decreased BS in bases ,  accessory muscle use, no dullness to percussion  CARD:  RRR, no m/r/g, no peripheral edema, pulses intact, no cyanosis or clubbing.  GI:   Soft & nt; nml bowel sounds; no organomegaly or masses detected.   Musco: Warm bil, no deformities or joint swelling noted.   Neuro: alert, no focal deficits noted.    Skin: Warm, no lesions or rashes    Lab Results:  CBC  BNP  No results found.    No flowsheet data found.  No results found for: NITRICOXIDE      Assessment & Plan:   COPD (chronic obstructive pulmonary disease) Appears compensated without flare   Plan  Patient Instructions  Continue on Symbicort 2 puffs twice daily, rinse after use Continue on DuoNeb nebulizer 4 times daily Taper off prednisone as directed.  Mucinex DM Twice daily  As needed  Cough/congestion   Continue on CPAP at bedtime.  Contact home care company regarding CPAP noise.  Decrease CPAP pressure to 12cmH2O .  CPAP download in 2 months .  Begin Oxygen 2l/m with activity .  POC order .  Follow-up in 3 months with Dr. Elsworth Soho and as needed        OSA (obstructive sleep apnea) Continue on CPAP. We will adjust CPAP pressure for comfort  Plan  Patient Instructions  Continue on Symbicort 2 puffs twice daily, rinse after use Continue on DuoNeb nebulizer 4 times daily Taper off prednisone as directed.  Mucinex DM Twice daily  As needed  Cough/congestion   Continue on CPAP at bedtime.  Contact home care company regarding CPAP noise.  Decrease CPAP pressure to 12cmH2O .  CPAP download in 2 months .  Begin Oxygen 2l/m with activity .  POC order .  Follow-up in 3 months with  Dr. Elsworth Soho and as needed        Chronic respiratory failure with hypoxia [facility]) Patient has severe COPD with  multiple comorbidities.  Now with exertional hypoxemia.  This is mild but patient is trying to stay active.  We will begin oxygen with activity at 2 L.  An order for portable oxygen concentrator.     Rexene Edison, NP 05/08/2018

## 2018-05-08 NOTE — Assessment & Plan Note (Signed)
Patient has severe COPD with multiple comorbidities.  Now with exertional hypoxemia.  This is mild but patient is trying to stay active.  We will begin oxygen with activity at 2 L.  An order for portable oxygen concentrator.

## 2018-05-08 NOTE — Assessment & Plan Note (Signed)
Appears compensated without flare   Plan  Patient Instructions  Continue on Symbicort 2 puffs twice daily, rinse after use Continue on DuoNeb nebulizer 4 times daily Taper off prednisone as directed.  Mucinex DM Twice daily  As needed  Cough/congestion   Continue on CPAP at bedtime.  Contact home care company regarding CPAP noise.  Decrease CPAP pressure to 12cmH2O .  CPAP download in 2 months .  Begin Oxygen 2l/m with activity .  POC order .  Follow-up in 3 months with Dr. Elsworth Soho and as needed

## 2018-05-08 NOTE — Assessment & Plan Note (Signed)
Continue on CPAP. We will adjust CPAP pressure for comfort  Plan  Patient Instructions  Continue on Symbicort 2 puffs twice daily, rinse after use Continue on DuoNeb nebulizer 4 times daily Taper off prednisone as directed.  Mucinex DM Twice daily  As needed  Cough/congestion   Continue on CPAP at bedtime.  Contact home care company regarding CPAP noise.  Decrease CPAP pressure to 12cmH2O .  CPAP download in 2 months .  Begin Oxygen 2l/m with activity .  POC order .  Follow-up in 3 months with Dr. Elsworth Soho and as needed

## 2018-05-08 NOTE — Patient Instructions (Addendum)
Continue on Symbicort 2 puffs twice daily, rinse after use Continue on DuoNeb nebulizer 4 times daily Taper off prednisone as directed.  Mucinex DM Twice daily  As needed  Cough/congestion   Continue on CPAP at bedtime.  Contact home care company regarding CPAP noise.  Decrease CPAP pressure to 12cmH2O .  CPAP download in 2 months .  Begin Oxygen 2l/m with activity .  POC order .  Follow-up in 3 months with Dr. Elsworth Soho and as needed

## 2018-05-10 ENCOUNTER — Telehealth: Payer: Self-pay | Admitting: Adult Health

## 2018-05-10 NOTE — Telephone Encounter (Signed)
Called and spoke with patient, he stated that he received his supplies (o2) from [facility] but it was wrong. He did not received the concentrator that he was supposed and the supplies don't fit. Called [place] with [facility] unable to reach left message to give [place] a call back.

## 2018-05-11 NOTE — Telephone Encounter (Signed)
PCC's- is there another DME we would send orders to so that he can get a POC? [facility] does not have then currently, thanks

## 2018-05-11 NOTE — Telephone Encounter (Signed)
I sent this as a high priority/urgent community message to [facility].

## 2018-05-11 NOTE — Telephone Encounter (Signed)
Spoke with pt, advised him that the order was sent to [facility] for the [place] urgently. Pt understood and nothing further is needed. Jon White

## 2018-05-11 NOTE — Telephone Encounter (Signed)
Pt is calling back for an update. Per pt, he called [facility] and was told they did not currently have any POC's in stock. Pt requesting someone call him back with info on where else he could possibly get a POC. CB is [phone].

## 2018-05-15 DIAGNOSIS — J3089 Other allergic rhinitis: Secondary | ICD-10-CM | POA: Diagnosis not present

## 2018-05-15 DIAGNOSIS — J339 Nasal polyp, unspecified: Secondary | ICD-10-CM | POA: Diagnosis not present

## 2018-05-15 DIAGNOSIS — J4530 Mild persistent asthma, uncomplicated: Secondary | ICD-10-CM | POA: Diagnosis not present

## 2018-05-15 DIAGNOSIS — R21 Rash and other nonspecific skin eruption: Secondary | ICD-10-CM | POA: Diagnosis not present

## 2018-05-25 ENCOUNTER — Telehealth: Payer: Self-pay | Admitting: Pulmonary Disease

## 2018-05-25 NOTE — Telephone Encounter (Signed)
Call made to patient, patient states he is very confused regarding his POC. Patient states there is a lot of settings that go from 1-6 and then there is more settings that say CFM and he is not sure what that means or what settings he needs to be on.I asked that the patient pull out his AVS from his last visit so that we could review together. I attempted to explain to the patient over the phone what his settings needed to be however I determined that this will require a stop by the office, as patient became more confused regarding this and needs face to face training. I informed the patient that he needed to stop by the office and have someone from Triage show him how to use his equipment. The patient thought that was a great idea. I explained that the office was open all day tomorrow and he states he would come by then. He later begin asking if he needed more oxygen could he turn it up. I explained that he required 2L but if determined that he required more he needed to make a office visit to determine why he was needing more and if this was his new baseline oxygen requirement. Voiced understanding. Nothing further is needed at this time.

## 2018-05-26 ENCOUNTER — Encounter: Payer: Self-pay | Admitting: Neurology

## 2018-05-26 ENCOUNTER — Telehealth: Payer: Self-pay | Admitting: Pulmonary Disease

## 2018-05-26 NOTE — Telephone Encounter (Signed)
When about to go out to lobby to speak with pt, Clayborne Dana stated she took care of speaking with pt. Will route this to Joellen Jersey so she can document in encounter.

## 2018-05-26 NOTE — Telephone Encounter (Signed)
Spoke with patient and wife-stated they were set up with O2 through [facility] but did not understand how to use the tanks,etc. I contacted Darnelle Bos with [facility] and explained this situation. Jeneen Rinks is sending someone back to the patients home today to review O2 set up again with patient and wife-also to answer any questions they may have. Pt is aware and nothing more needed at this time.

## 2018-06-06 ENCOUNTER — Other Ambulatory Visit: Payer: Self-pay | Admitting: Cardiovascular Disease

## 2018-06-06 MED ORDER — NITROGLYCERIN 0.4 MG SL SUBL: 0.4000 mg | SUBLINGUAL_TABLET | SUBLINGUAL | 0 refills | Status: DC | PRN

## 2018-06-06 NOTE — Progress Notes (Signed)
Jon White was seen today in the movement disorders clinic for neurologic consultation at the request of Merrilee Seashore, MD.  The consultation is for the evaluation of tremor.  Referral records are reviewed.  No records of tremor are noted.  This patient is accompanied in the office by his spouse who supplements the history.  Tremor: Yes.     How long has it been going on? "I have always been nervous."  Wife thinks that it started 2-5 years ago.  Got worse in March when in hospital a year ago  At rest or with activation?  activation  When is it noted the most?  Trying to eat  Fam hx of tremor?  No.  Located where?  Bilateral UE (notes most in the L hand as is L hand dominant)  Affected by caffeine:  No. (2 cups coffee/day)  Affected by alcohol:  No. (1 beer/week)  Affected by stress:  Yes.    Affected by fatigue:  No.  Spills soup if on spoon: yes  Spills glass of liquid if full:  Yes.    Affects ADL's (tying shoes, brushing teeth, etc):  No.  Tremor inducing meds:  Yes.   Symbicort, pro-air, ipratropium/albuterol (uses nebulizer 3-4 times per day)  Other Specific Symptoms:  Voice: hoarse - sees Dr. Arnette Norris from ENT and told that he could do vocal cord injections if got worse Sleep: sleeps well at night with cpap  Vivid Dreams:  No.  Acting out dreams:  No. Postural symptoms:  Yes.   x 3-4 months  Falls?  Yes.   Bradykinesia symptoms: slow movements and difficulty getting out of a chair Loss of smell:  No. Loss of taste:  No. Urinary Incontinence:  No. Difficulty Swallowing:  No. Depression:  No. Memory changes:  No. N/V:  No. Lightheaded:  Yes.   if arises fast  Syncope: No. Diplopia:  Yes.   if awakens fast but can blink it away and goes away  Neuroimaging of the brain has not previously been performed.    Also c/o "strange sensation" in the R leg.  Feels like he has a boot on the right leg.  He is not DM.  Sx's x 1.5 years.  L leg feels like that but only to the  ankle and not to mid calf.     ALLERGIES:   Allergies  Allergen Reactions  . Mometasone Furoate Other (See Comments)    Nasonex: CAUSED RED,ITCHY EYES; "lost my eyelashes"   . Naproxen Sodium Nausea And Vomiting  . Nsaids Other (See Comments)    Stomach ulcers  . Anoro Ellipta [Umeclidinium-Vilanterol] Other (See Comments)    Double vision  . Aspirin Hives and Nausea And Vomiting     Chills, sweats  . Bevespi Aerosphere [Glycopyrrolate-Formoterol] Hives  . Pulmicort [Budesonide] Hives  . Breo Ellipta [Fluticasone Furoate-Vilanterol] Hives  . Dexilant [Dexlansoprazole] Diarrhea    Cramps   . Omnaris [Ciclesonide] Other (See Comments)    Nose bleed   . Spiriva Handihaler [Tiotropium Bromide Monohydrate] Other (See Comments)    Hiccups     CURRENT MEDICATIONS:  Outpatient Encounter Medications as of 06/09/2018  Medication Sig  . albuterol (PROVENTIL HFA;VENTOLIN HFA) 108 (90 Base) MCG/ACT inhaler INHALE 2 PUFFS BY MOUTH EVERY 6 HOURS AS NEEDED FOR WHEEZING OR SHORTNESS OF BREATH  . albuterol (PROVENTIL) (2.5 MG/3ML) 0.083% nebulizer solution Take 2.5 mg by nebulization 4 (four) times daily.  [name] Kitchen atorvastatin (LIPITOR) 10 MG tablet Take 10 mg by  mouth daily at 6 PM.   . budesonide-formoterol (SYMBICORT) 160-4.5 MCG/ACT inhaler Inhale 2 puffs into the lungs 2 (two) times daily.  . budesonide-formoterol (SYMBICORT) 160-4.5 MCG/ACT inhaler Inhale 2 puffs into the lungs 2 (two) times daily.  [name] Kitchen ELIQUIS 5 MG TABS tablet TAKE 1 TABLET(5 MG) BY MOUTH TWICE DAILY  . esomeprazole (NEXIUM) 40 MG capsule Take 1 capsule (40 mg total) by mouth daily.  . ferrous sulfate 220 (44 Fe) MG/5ML solution Take 220 mg by mouth every other day.  . fluocinonide cream (LIDEX) 0.07 % Apply 1 application topically daily as needed (rash). To infected area.  . fluticasone (FLONASE) 50 MCG/ACT nasal spray Place 2 sprays into the nose at bedtime. Each nostril once a day.  . furosemide (LASIX) 40 MG tablet TAKE 1  TABLET(40 MG) BY MOUTH DAILY  . ipratropium (ATROVENT) 0.02 % nebulizer solution Take 2.5 mLs (0.5 mg total) by nebulization 4 (four) times daily.  [name] Kitchen ketoconazole (NIZORAL) 2 % cream Apply 1 fingertip amount to bottom of each foot and toenails daily  . metoprolol tartrate (LOPRESSOR) 25 MG tablet Take 0.5 tablets (12.5 mg total) by mouth 2 (two) times daily.  . tamsulosin (FLOMAX) 0.4 MG CAPS capsule Take 0.4 mg by mouth daily.  . nitroGLYCERIN (NITROSTAT) 0.4 MG SL tablet Place 1 tablet (0.4 mg total) under the tongue every 5 (five) minutes as needed for chest pain. Please keep upcoming appt. thanks (Patient not taking: Reported on 06/09/2018)   No facility-administered encounter medications on file as of 06/09/2018.     PAST MEDICAL HISTORY:   Past Medical History:  Diagnosis Date  . Allergic rhinitis   . Allergy   . Asthma   . Atrial fibrillation ([place])   . Bleeding gastric ulcer   . Blood transfusion without reported diagnosis   . CAD (coronary artery disease) 07/24/2017  . Cataract    bilateral removed  . Chronic combined systolic and diastolic congestive heart failure ([place])   . Chronic renal insufficiency, stage III (moderate) (H[facility])   . COPD (chronic obstructive pulmonary disease) ([place])   . Diabetes mellitus without complication ([place])    no meds- boarderline  . Diastolic heart failure ([place])   . Duodenum ulcer    with perforation  . DVT of axillary vein, acute left ([place]) 10/24/2014  . Emphysema   . GERD (gastroesophageal reflux disease)   . H/O hiatal hernia   . High cholesterol   . Hypertension   . Mixed hyperlipidemia   . Myocardial infarction ([place])   . Neuromuscular disorder ([place])    reports shaking, somedays "can't hold spoon" - generalized  shakiness, is going to talk to MD about it at next appt.   . Normal cardiac stress test    reports stress test was several yrs. ago /w [employer]. Dr. Rogelia Mire, WNL   . OSA (obstructive sleep apnea) 2007   CPAP- report of settings  on paper chart, seen by [facility] - seen 07/2011   . PE (pulmonary embolism) 10/2011  . Prostate cancer ([place]) 1992   in remission.  s/p XRT, melanoma under left eye, left temple  . Recurrent upper respiratory infection (URI)    coughing from chronic sinusitis   . Renal stone    some passed spontaneous, & also had cystoscopy  . Shortness of breath on exertion   . Sleep apnea    wears c-pap    PAST SURGICAL HISTORY:   Past Surgical History:  Procedure Laterality Date  . CATARACT EXTRACTION  W/ INTRAOCULAR LENS  IMPLANT, BILATERAL  ~ 2000  . COLONOSCOPY N/A 04/03/2014   Procedure: COLONOSCOPY;  Surgeon: Inda Castle, MD;  Location: [facility];  Service: Endoscopy;  Laterality: N/A;  . ESOPHAGOGASTRODUODENOSCOPY N/A 08/28/2013   Procedure: ESOPHAGOGASTRODUODENOSCOPY (EGD);  Surgeon: Gayland Curry, MD;  Location: [place];  Service: General;  Laterality: N/A;  . ESOPHAGOGASTRODUODENOSCOPY N/A 04/03/2014   Procedure: ESOPHAGOGASTRODUODENOSCOPY (EGD);  Surgeon: Inda Castle, MD;  Location: [facility];  Service: Endoscopy;  Laterality: N/A;  . GASTROSTOMY N/A 08/28/2013   Procedure: GASTROSTOMY;  Surgeon: Rolm Bookbinder, MD;  Location: [place];  Service: General;  Laterality: N/A;  . LAPAROTOMY N/A 08/28/2013   Procedure: EXPLORATORY LAPAROTOMY;  Surgeon: Rolm Bookbinder, MD;  Location: [place];  Service: General;  Laterality: N/A;  . LOBECTOMY Right 1986   1/3 removed   . LUNG SURGERY  1986   presumed from RLL. Segmentectomy. Benign Nodule.   [name] Kitchen MAXILLARY ANTROSTOMY  09/15/2011   Procedure: MAXILLARY ANTROSTOMY;  Surgeon: Ascencion Dike, MD;  Location: [facility] OR;  Service: ENT;  Laterality: Bilateral;  TOTAL MAXILLARY ANTROSTOMY  . MELANOMA EXCISION Left    under left eye  . REPAIR OF PERFORATED ULCER N/A 08/28/2013   Procedure: DUODENAL ULCER PATCH;  Surgeon: Rolm Bookbinder, MD;  Location: [place];  Service: General;  Laterality: N/A;  . SEPTOPLASTY  09/15/11  . SEPTOPLASTY  09/15/2011    Procedure: SEPTOPLASTY;  Surgeon: Ascencion Dike, MD;  Location: [place];  Service: ENT;  Laterality: Bilateral;  . SINUS ENDO W/FUSION  sch. 09/15/2011   Procedure: ENDOSCOPIC SINUS SURGERY WITH FUSION NAVIGATION;  Surgeon: Ascencion Dike, MD;  Location: [place];  Service: ENT;  Laterality: Bilateral;  bilateral maxillary antrostomy, bilateral endoscopic ethmoidectomy, bilateral sphenoidectomy, bilateral frontal recess exploration  . SINUS ENDO W/FUSION  09/15/2011   Procedure: ENDOSCOPIC SINUS SURGERY WITH FUSION NAVIGATION;  Surgeon: Ascencion Dike, MD;  Location: [place];  Service: ENT;  Laterality: Bilateral;  . SINUS EXPLORATION  09/15/2011   Procedure: SINUS EXPLORATION;  Surgeon: Ascencion Dike, MD;  Location: [place];  Service: ENT;  Laterality: Bilateral;  FRONTAL RECESS EXPLORATION  . SPHENOIDECTOMY  09/15/2011   Procedure: Coralee Pesa;  Surgeon: Ascencion Dike, MD;  Location: [place];  Service: ENT;  Laterality: Bilateral;  . URETEROSCOPY  1980's    SOCIAL HISTORY:   Social History   Socioeconomic History  . Marital status: Married    Spouse name: Not on file  . Number of children: 4  . Years of education: Not on file  . Highest education level: Not on file  Occupational History  . Occupation: 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   Social Needs  . Financial resource strain: Not on file  . Food insecurity:    Worry: Not on file    Inability: Not on file  . Transportation needs:    Medical: Not on file    Non-medical: Not on file  Tobacco Use  . Smoking status: Former Smoker    Packs/day: 1.00    Years: 60.00    Pack years: 60.00    Types: Cigarettes    Last attempt to quit: 08/06/2011    Years since quitting: 6.8  . Smokeless tobacco: Never Used  Substance and Sexual Activity  . Alcohol use: Yes    Alcohol/week: 1.0 standard drinks    Types: 1 Cans of beer per week  . Drug use: No  . Sexual activity: Never  Lifestyle  .  Physical activity:    Days per week: Not on file    Minutes  per session: Not on file  . Stress: Not on file  Relationships  . Social connections:    Talks on phone: Not on file    Gets together: Not on file    Attends religious service: Not on file    Active member of club or organization: Not on file    Attends meetings of clubs or organizations: Not on file    Relationship status: Not on file  . Intimate partner violence:    Fear of current or ex partner: Not on file    Emotionally abused: Not on file    Physically abused: Not on file    Forced sexual activity: Not on file  Other Topics Concern  . Not on file  Social History Narrative   Traveled to [place].   Married and lives with wife.   Originally from [place].   Spends winters in [place]   Has 2 kids in [place]:   Family Status  Relation Name Status  . Father  Deceased  . Mother  Deceased  . PGF  Deceased  . PGM  Deceased  . MGF  Deceased  . MGM  Deceased  . Brother  (Not Specified)  . Brother  (Not Specified)  . Brother  (Not Specified)  . Sister  (Not Specified)  . Neg Hx  (Not Specified)    ROS:  Review of Systems  Constitutional: Negative.   HENT: Negative.   Eyes: Negative.   Respiratory: Positive for shortness of breath.   Cardiovascular: Negative.   Gastrointestinal: Negative.   Musculoskeletal: Positive for back pain.  Skin: Negative.   Neurological: Positive for tremors.  Endo/Heme/Allergies: Negative.     PHYSICAL EXAMINATION:    VITALS:   Vitals:   06/09/18 1011  BP: (!) 106/58  Pulse: 75  SpO2: 91%  Weight: 167 lb (75.8 kg)  Height: 5\' 8"  (1.727 m)    GEN:  The patient appears stated age and is in NAD. HEENT:  Normocephalic, atraumatic.  The mucous membranes are moist. The superficial temporal arteries are without ropiness or tenderness. CV:  RRR Lungs:  There is decreased air movement with diffuse exp wheezes Neck/HEME:  There are no carotid bruits bilaterally.  Neurological examination:  Orientation: The patient is  alert and oriented x3. Fund of knowledge is appropriate.  Recent and remote memory are intact.  Attention and concentration are normal.    Able to name objects and repeat phrases. Cranial nerves: There is good facial symmetry. Pupils are equal round and reactive to light bilaterally. Fundoscopic exam reveals clear margins bilaterally. Extraocular muscles are intact. The visual fields are full to confrontational testing. The speech is fluent and clear.  Voice is hoarse in quality.  Soft palate rises symmetrically and there is no tongue deviation. Hearing is intact to conversational tone. Sensation: Sensation is intact to light and pinprick throughout (facial, trunk, extremities) but light touch is decreased in the right leg compared to the left. There is decreased pinprick in a stocking distribution.  Vibration is marked decreased at the bilateral big toe, ankle and knee. There is no extinction with double simultaneous stimulation. There is no sensory dermatomal level identified. Motor: Strength is 5/5 in the bilateral upper and lower extremities.   Shoulder shrug is equal and symmetric.  There is no pronator drift. Deep tendon reflexes: Deep tendon reflexes are 2/4 at  the bilateral biceps, triceps, brachioradialis,2+ at the bilateral  patella and absent at the bilateral achilles. Plantar responses are downgoing bilaterally.  Movement examination: Tone: There is normal tone in the upper and lower extremities. Abnormal movements: there is bilateral UE and LE resting tremor.  There is low amplitude tremor of the outstretched hands.  This only slightly increased with intention.  This decreases when given a weight.  He does have trouble with Archimedes spirals bilaterally, particularly has trouble first getting the pen on the paper. Coordination:  There is no decremation with RAM's, with any form of RAMS, including alternating supination and pronation of the forearm, hand opening and closing, finger taps, heel  taps and toe taps. Gait and Station: The patient pushes off of the chair to arise.  He is wide-based.  He has decreased arm swing on the right.    Lab work has been reviewed from primary care physician.  Lab work is dated March 30, 2018.  Sodium is 143, potassium 5.1, chloride 102, CO2 34, BUN 40, creatinine 1.5, glucose 81, AST 17, ALT 28.  Hemoglobin A1c 6.1.  Last TSH was done on September 27, 2017.  This was 3.09.  Last CBC was done on September 27, 2017.  White blood cells were 10.4, hemoglobin 14.3, hematocrit 43.3 and platelets 207.  ASSESSMENT/PLAN:  1.  Tremor  -Discussed with the patient and his wife that tremor is likely due, at least in part, to his medications, particularly the nebulizer that he is using 4 times per day.  Discussed that oftentimes medications for tremor do not help when the tremor is induced by other medications.  We also discussed that 1 of the primary medications that we use for tremor (primidone) interacts with Eliquis and if we were to consider trying medications, his Eliquis would need to be changed to Pradaxa.  He does have an appointment with cardiology next month.  He is going to think about these things.  Overall, I do not think getting primidone is going to make a significant difference in tremor given the amount of nebulizer treatments he has to use per day.  -I did recommend that he try weighted spoons and weighted forks.  Also recommended that he try half pound weighted glove.  I also gave him information on the readi steadi glove.  2.  Peripheral neuropathy  -The patient has clinical examination evidence of a diffuse peripheral neuropathy, which certainly can affect gait and balance.  We discussed safety associated with peripheral neuropathy.  We discussed balance therapy and the importance of ambulatory assistive device for balance assistance.  This is likely the source of the strange feeling in the lower aspect of his legs.  We discussed medications.  He  ultimately, he decided that he did not want more medication.  -We will do labs to r/o reversible causes of peripheral neuropathy.  3.  Follow-up will be on an as-needed basis.  Greater than 50% of the 45-minute visit was spent in counseling with patient and his wife.   Cc:  Merrilee Seashore, MD

## 2018-06-09 ENCOUNTER — Other Ambulatory Visit (INDEPENDENT_AMBULATORY_CARE_PROVIDER_SITE_OTHER): Payer: Medicare Other

## 2018-06-09 ENCOUNTER — Encounter: Payer: Self-pay | Admitting: Neurology

## 2018-06-09 ENCOUNTER — Ambulatory Visit (INDEPENDENT_AMBULATORY_CARE_PROVIDER_SITE_OTHER): Payer: Medicare Other | Admitting: Neurology

## 2018-06-09 VITALS — BP 106/58 | HR 75 | Ht 68.0 in | Wt 167.0 lb

## 2018-06-09 DIAGNOSIS — Z5181 Encounter for therapeutic drug level monitoring: Secondary | ICD-10-CM

## 2018-06-09 DIAGNOSIS — G609 Hereditary and idiopathic neuropathy, unspecified: Secondary | ICD-10-CM | POA: Diagnosis not present

## 2018-06-09 DIAGNOSIS — R251 Tremor, unspecified: Secondary | ICD-10-CM

## 2018-06-09 NOTE — Patient Instructions (Addendum)
1.  You have tremor, which is likely due to your nebulizer (or at least exacerbated by medication).  We discussed that oftentimes medications for tremor do not help when tremor is induced by other medications.  We also discussed that 1 of the primary medications that we use for tremor interacts with your Eliquis.  We would need to see if we can change that to Pradaxa if we were going to consider trying any tremor medication.  2.  I would recommend that you try weighted spoons and weighted forks.  I would also recommend that he try 1/2 pound weighted glove, which one can usually buy on the site like [employer].  If that does not help, you can research the information that I gave you on the readi steadi glove which is specifically for tremor.  3.  I think the feeling in your legs is likely due to peripheral neuropathy.  You have opted not to take any medication for that at this time.  We will do lab work in that regard.  4. Your provider has requested that you have labwork completed today. Please go to [facility] ([phone]) on the second floor of this building before leaving the office today. You do not need to check in. If you are not called within 15 minutes please check with the front desk.   5.  I will be happy to see you back on an as-needed basis.  It was nice seeing you today.

## 2018-06-14 LAB — IMMUNOFIXATION ELECTROPHORESIS
IgG (Immunoglobin G), Serum: 789 mg/dL (ref 600–1540)
IgM, Serum: 32 mg/dL — ABNORMAL LOW (ref 50–300)
Immunoglobulin A: 359 mg/dL — ABNORMAL HIGH (ref 70–320)

## 2018-06-14 LAB — PROTEIN ELECTROPHORESIS,RANDOM URN
Creatinine, Urine: 34 mg/dL (ref 20–320)
PROTEIN/CREAT RATIO: 118 mg/g{creat} (ref 22–128)
PROTEIN/CREATININE RATIO: 0.118 mg/mg{creat} (ref 0.022–0.12)
Total Protein, Urine: 4 mg/dL — ABNORMAL LOW (ref 5–25)

## 2018-06-14 LAB — EXTRA URINE SPECIMEN

## 2018-06-14 LAB — VITAMIN B12: Vitamin B-12: 471 pg/mL (ref 200–1100)

## 2018-06-14 LAB — PROTEIN ELECTROPHORESIS, SERUM
ALPHA 1: 0.3 g/dL (ref 0.2–0.3)
Albumin ELP: 3.8 g/dL (ref 3.8–4.8)
Alpha 2: 1 g/dL — ABNORMAL HIGH (ref 0.5–0.9)
Beta 2: 0.6 g/dL — ABNORMAL HIGH (ref 0.2–0.5)
Beta Globulin: 0.6 g/dL (ref 0.4–0.6)
Gamma Globulin: 0.6 g/dL — ABNORMAL LOW (ref 0.8–1.7)
Total Protein: 6.9 g/dL (ref 6.1–8.1)

## 2018-06-14 LAB — FOLATE: Folate: 23 ng/mL

## 2018-06-14 LAB — IMMUNOFIXATION INTE

## 2018-06-14 LAB — RPR: RPR Ser Ql: NONREACTIVE

## 2018-06-23 ENCOUNTER — Ambulatory Visit (INDEPENDENT_AMBULATORY_CARE_PROVIDER_SITE_OTHER): Payer: Medicare Other | Admitting: Podiatry

## 2018-06-23 DIAGNOSIS — B351 Tinea unguium: Secondary | ICD-10-CM

## 2018-06-23 DIAGNOSIS — M79674 Pain in right toe(s): Secondary | ICD-10-CM | POA: Diagnosis not present

## 2018-06-23 DIAGNOSIS — E1151 Type 2 diabetes mellitus with diabetic peripheral angiopathy without gangrene: Secondary | ICD-10-CM

## 2018-06-23 DIAGNOSIS — M79675 Pain in left toe(s): Secondary | ICD-10-CM

## 2018-06-23 NOTE — Patient Instructions (Signed)
Diabetes Mellitus and Foot Care  Foot care is an important part of your health, especially when you have diabetes. Diabetes may cause you to have problems because of poor blood flow (circulation) to your feet and legs, which can cause your skin to:   Become thinner and drier.   Break more easily.   Heal more slowly.   Peel and crack.  You may also have nerve damage (neuropathy) in your legs and feet, causing decreased feeling in them. This means that you may not notice minor injuries to your feet that could lead to more serious problems. Noticing and addressing any potential problems early is the best way to prevent future foot problems.  How to care for your feet  Foot hygiene   Wash your feet daily with warm water and mild soap. Do not use hot water. Then, pat your feet and the areas between your toes until they are completely dry. Do not soak your feet as this can dry your skin.   Trim your toenails straight across. Do not dig under them or around the cuticle. File the edges of your nails with an emery board or nail file.   Apply a moisturizing lotion or petroleum jelly to the skin on your feet and to dry, brittle toenails. Use lotion that does not contain alcohol and is unscented. Do not apply lotion between your toes.  Shoes and socks   Wear clean socks or stockings every day. Make sure they are not too tight. Do not wear knee-high stockings since they may decrease blood flow to your legs.   Wear shoes that fit properly and have enough cushioning. Always look in your shoes before you put them on to be sure there are no objects inside.   To break in new shoes, wear them for just a few hours a day. This prevents injuries on your feet.  Wounds, scrapes, corns, and calluses   Check your feet daily for blisters, cuts, bruises, sores, and redness. If you cannot see the bottom of your feet, use a mirror or ask someone for help.   Do not cut corns or calluses or try to remove them with medicine.   If you  find a minor scrape, cut, or break in the skin on your feet, keep it and the skin around it clean and dry. You may clean these areas with mild soap and water. Do not clean the area with peroxide, alcohol, or iodine.   If you have a wound, scrape, corn, or callus on your foot, look at it several times a day to make sure it is healing and not infected. Check for:  ? Redness, swelling, or pain.  ? Fluid or blood.  ? Warmth.  ? Pus or a bad smell.  General instructions   Do not cross your legs. This may decrease blood flow to your feet.   Do not use heating pads or hot water bottles on your feet. They may burn your skin. If you have lost feeling in your feet or legs, you may not know this is happening until it is too late.   Protect your feet from hot and cold by wearing shoes, such as at the beach or on hot pavement.   Schedule a complete foot exam at least once a year (annually) or more often if you have foot problems. If you have foot problems, report any cuts, sores, or bruises to your health care provider immediately.  Contact a health care provider if:     You have a medical condition that increases your risk of infection and you have any cuts, sores, or bruises on your feet.   You have an injury that is not healing.   You have redness on your legs or feet.   You feel burning or tingling in your legs or feet.   You have pain or cramps in your legs and feet.   Your legs or feet are numb.   Your feet always feel cold.   You have pain around a toenail.  Get help right away if:   You have a wound, scrape, corn, or callus on your foot and:  ? You have pain, swelling, or redness that gets worse.  ? You have fluid or blood coming from the wound, scrape, corn, or callus.  ? Your wound, scrape, corn, or callus feels warm to the touch.  ? You have pus or a bad smell coming from the wound, scrape, corn, or callus.  ? You have a fever.  ? You have a red line going up your leg.  Summary   Check your feet every day  for cuts, sores, red spots, swelling, and blisters.   Moisturize feet and legs daily.   Wear shoes that fit properly and have enough cushioning.   If you have foot problems, report any cuts, sores, or bruises to your health care provider immediately.   Schedule a complete foot exam at least once a year (annually) or more often if you have foot problems.  This information is not intended to replace advice given to you by your health care provider. Make sure you discuss any questions you have with your health care provider.  Document Released: 05/14/2000 Document Revised: 06/29/2017 Document Reviewed: 06/18/2016  Elsevier Interactive Patient Education  2019 [employer]

## 2018-06-26 ENCOUNTER — Telehealth: Payer: Self-pay | Admitting: Pulmonary Disease

## 2018-06-26 DIAGNOSIS — J449 Chronic obstructive pulmonary disease, unspecified: Secondary | ICD-10-CM

## 2018-06-26 NOTE — Telephone Encounter (Signed)
Called and spoke with Patient.  He is requesting a POC or smaller tanks.  Called and spoke with Clement Husbands.  He stated that the Patient had been on O2 for 41months and could not receive POC, but could receive smaller tanks with order for titrate for OCD.  Per TP's last OV, 05/08/18- Begin Oxygen 2l/m with activity .  POC order .  Follow-up in 3 months with Dr. Elsworth Soho and as needed Placed order for titrate for OCD, and POC with [facility]

## 2018-06-27 ENCOUNTER — Other Ambulatory Visit: Payer: Self-pay | Admitting: Internal Medicine

## 2018-07-04 DIAGNOSIS — L570 Actinic keratosis: Secondary | ICD-10-CM | POA: Diagnosis not present

## 2018-07-04 DIAGNOSIS — D485 Neoplasm of uncertain behavior of skin: Secondary | ICD-10-CM | POA: Diagnosis not present

## 2018-07-04 DIAGNOSIS — D0439 Carcinoma in situ of skin of other parts of face: Secondary | ICD-10-CM | POA: Diagnosis not present

## 2018-07-04 DIAGNOSIS — Z23 Encounter for immunization: Secondary | ICD-10-CM | POA: Diagnosis not present

## 2018-07-04 DIAGNOSIS — C44329 Squamous cell carcinoma of skin of other parts of face: Secondary | ICD-10-CM | POA: Diagnosis not present

## 2018-07-04 DIAGNOSIS — C4442 Squamous cell carcinoma of skin of scalp and neck: Secondary | ICD-10-CM | POA: Diagnosis not present

## 2018-07-06 ENCOUNTER — Encounter: Payer: Self-pay | Admitting: Podiatry

## 2018-07-06 NOTE — Progress Notes (Signed)
Subjective: Patient presents today with diabetes and cc of painful, discolored, thick toenails which interfere with daily activities. Pain is aggravated when wearing enclosed shoe gear. Pain is getting progressively worse and relieved with periodic professional debridement.  Merrilee Seashore, MD is his PCP.   Current Outpatient Medications:  .  albuterol (PROVENTIL HFA;VENTOLIN HFA) 108 (90 Base) MCG/ACT inhaler, INHALE 2 PUFFS BY MOUTH EVERY 6 HOURS AS NEEDED FOR WHEEZING OR SHORTNESS OF BREATH, Disp: 8.5 g, Rfl: 3 .  albuterol (PROVENTIL) (2.5 MG/3ML) 0.083% nebulizer solution, Take 2.5 mg by nebulization 4 (four) times daily., Disp: , Rfl:  .  atorvastatin (LIPITOR) 10 MG tablet, Take 10 mg by mouth daily at 6 PM. , Disp: , Rfl:  .  budesonide-formoterol (SYMBICORT) 160-4.5 MCG/ACT inhaler, Inhale 2 puffs into the lungs 2 (two) times daily., Disp: 10.2 g, Rfl: 5 .  budesonide-formoterol (SYMBICORT) 160-4.5 MCG/ACT inhaler, Inhale 2 puffs into the lungs 2 (two) times daily., Disp: 2 Inhaler, Rfl: 0 .  esomeprazole (NEXIUM) 40 MG capsule, Take 1 capsule (40 mg total) by mouth daily., Disp: 90 capsule, Rfl: 6 .  ferrous sulfate 220 (44 Fe) MG/5ML solution, Take 220 mg by mouth every other day., Disp: , Rfl:  .  fluocinonide cream (LIDEX) 4.62 %, Apply 1 application topically daily as needed (rash). To infected area., Disp: , Rfl:  .  fluticasone (FLONASE) 50 MCG/ACT nasal spray, Place 2 sprays into the nose at bedtime. Each nostril once a day., Disp: , Rfl:  .  furosemide (LASIX) 40 MG tablet, TAKE 1 TABLET(40 MG) BY MOUTH DAILY, Disp: 90 tablet, Rfl: 3 .  ipratropium (ATROVENT) 0.02 % nebulizer solution, Take 2.5 mLs (0.5 mg total) by nebulization 4 (four) times daily., Disp: 120 mL, Rfl: 12 .  ketoconazole (NIZORAL) 2 % cream, Apply 1 fingertip amount to bottom of each foot and toenails daily, Disp: 30 g, Rfl: 0 .  metoprolol tartrate (LOPRESSOR) 25 MG tablet, Take 0.5 tablets (12.5 mg total)  by mouth 2 (two) times daily., Disp: 90 tablet, Rfl: 3 .  nitroGLYCERIN (NITROSTAT) 0.4 MG SL tablet, Place 1 tablet (0.4 mg total) under the tongue every 5 (five) minutes as needed for chest pain. Please keep upcoming appt. thanks, Disp: 75 tablet, Rfl: 0 .  tamsulosin (FLOMAX) 0.4 MG CAPS capsule, Take 0.4 mg by mouth daily., Disp: , Rfl:  .  ELIQUIS 5 MG TABS tablet, TAKE 1 TABLET(5 MG) BY MOUTH TWICE DAILY, Disp: 180 tablet, Rfl: 0   Allergies  Allergen Reactions  . Mometasone Furoate Other (See Comments)    Nasonex: CAUSED RED,ITCHY EYES; "lost my eyelashes"   . Naproxen Sodium Nausea And Vomiting  . Nsaids Other (See Comments)    Stomach ulcers  . Anoro Ellipta [Umeclidinium-Vilanterol] Other (See Comments)    Double vision  . Aspirin Hives and Nausea And Vomiting     Chills, sweats  . Bevespi Aerosphere [Glycopyrrolate-Formoterol] Hives  . Pulmicort [Budesonide] Hives  . Breo Ellipta [Fluticasone Furoate-Vilanterol] Hives  . Dexilant [Dexlansoprazole] Diarrhea    Cramps   . Omnaris [Ciclesonide] Other (See Comments)    Nose bleed   . Spiriva Handihaler [Tiotropium Bromide Monohydrate] Other (See Comments)    Hiccups      Objective:  Vascular Examination: Capillary refill time <3 seconds x 10 digits Dorsalis pedis pulses 1/4 b/l Posterior tibial pulses absent b/l No digital hair x 10 digits Skin temperature warm to cool b/l  Dermatological Examination: Skin with normal turgor texture and tone bilaterally  Toenails 1-5 b/l discolored, thick, dystrophic with subungual debris and pain with palpation to nailbeds due to thickness of nails.  No open wounds bilaterally  No interdigital macerations noted bilaterally  No hyperkeratotic lesions noted bilaterally  Musculoskeletal: Muscle strength 5/5 to all LE muscle groups  Neurological: Sensation intact with 10 gram monofilament. Vibratory sensation intact.  Assessment: 1. Painful onychomycosis toenails 1-5  b/l 2. NIDDM with Peripheral arterial disease  Plan: 1. Medicare ABN signed for 2020. 2. Toenails 1-5 b/l were debrided in length and girth without iatrogenic bleeding. 3. Patient to continue soft, supportive shoe gear 4. Patient to report any pedal injuries to medical professional  5. Follow up 3 months. Patient/POA to call should there be a concern in the interim.

## 2018-07-10 ENCOUNTER — Encounter: Payer: Self-pay | Admitting: Cardiovascular Disease

## 2018-07-10 ENCOUNTER — Ambulatory Visit (INDEPENDENT_AMBULATORY_CARE_PROVIDER_SITE_OTHER): Payer: Medicare Other | Admitting: Cardiovascular Disease

## 2018-07-10 VITALS — BP 110/60 | HR 61 | Ht 68.0 in | Wt 168.4 lb

## 2018-07-10 DIAGNOSIS — I10 Essential (primary) hypertension: Secondary | ICD-10-CM

## 2018-07-10 DIAGNOSIS — I5032 Chronic diastolic (congestive) heart failure: Secondary | ICD-10-CM

## 2018-07-10 DIAGNOSIS — I2510 Atherosclerotic heart disease of native coronary artery without angina pectoris: Secondary | ICD-10-CM | POA: Diagnosis not present

## 2018-07-10 DIAGNOSIS — E7800 Pure hypercholesterolemia, unspecified: Secondary | ICD-10-CM

## 2018-07-10 NOTE — Patient Instructions (Signed)
Medication Instructions:  Your physician recommends that you continue on your current medications as directed. Please refer to the Current Medication list given to you today.  If you need a refill on your cardiac medications before your next appointment, please call your pharmacy.   Lab work: none If you have labs (blood work) drawn today and your tests are completely normal, you will receive your results only by: . MyChart Message (if you have MyChart) OR . A paper copy in the mail If you have any lab test that is abnormal or we need to change your treatment, we will call you to review the results.  Testing/Procedures: none  Follow-Up: At [facility], you and your health needs are our priority.  As part of our continuing mission to provide you with exceptional heart care, we have created designated Provider Care Teams.  These Care Teams include your primary Cardiologist (physician) and Advanced Practice Providers (APPs -  Physician Assistants and Nurse Practitioners) who all work together to provide you with the care you need, when you need it. You will need a follow up appointment in 12 months.  Please call our office 4 months in advance to schedule this appointment.  You may see Christopher McAlhany, MD or one of the following Advanced Practice Providers on your designated Care Team:   Brittainy Simmons, PA-C Dayna Dunn, PA-C . Michele Lenze, PA-C  Any Other Special Instructions Will Be Listed Below (If Applicable).    

## 2018-07-10 NOTE — Progress Notes (Signed)
Chief Complaint  Patient presents with  . Follow-up    chronic diastolic CHF    History of Present Illness: 85 yo male with history of PE/DVT,  PVCs, NSVT, GI bleeding, COPD, prostate cancer, OSA, HLD and chronic kidney disease who is here today for cardiac follow up. He was seen as a new patient 03/25/14 for cardiac evaluation after he arrived back in [place] from a trip to [place]. He was in [place] September 2015 and had a syncopal episode. He was taken to a [facility] where he was diagnosed with a non-STEMI. His coumadin was held and he was placed on IV heparin but he developed a GI bleed and no cath was performed. Upper endoscopy showed multiple bleeding gastric ulcers. He also had runs of non-sustained VT and was discharged on metoprolol. Echo 02/08/14 from [facility] with LVEF=40-45%. I arranged a cardiac cath after his first visit but this was cancelled pending GI workup. EGD 04/03/14 with active bleeding ulcer in the duodenal bulb. Otherwise EGD was normal. Severe sigmoid diverticulosis noted on colonoscopy.Follow up EGD May 2016 with healed ulcer. He was admitted to [facility] 10/24/14 with dyspnea and found to have recurrent PE. He was started on Eliquis. Echo 10/24/14 with normal LV function, aortic valve sclerosis. He was seen here 11/07/14 and was doing well. He was seen in the the Pulmonary office the following week and his HR was felt to be in the 40s but no EKG was done. I saw him 11/22/14 and EKG showed ventricular bigeminy with overall HR in the 80s. He was started on a beta blocker and he did well. Cardiac monitor 2017 with 72,000 PVCs over 48 hours. He was seen in the EP clinic by Dr. Lovena Le and medical therapy was continued. Echo 10/27/16 with LVEF=50%, no valve disease.  He is intolerant of ASA (hives).   He is here today for follow up. The patient denies any chest pain, dyspnea, palpitations, lower extremity edema, orthopnea, PND, dizziness, near syncope or syncope.     Primary Care  Physician: Merrilee Seashore, MD  Past Medical History:  Diagnosis Date  . Allergic rhinitis   . Allergy   . Asthma   . Atrial fibrillation ([place])   . Bleeding gastric ulcer   . Blood transfusion without reported diagnosis   . CAD (coronary artery disease) 07/24/2017  . Cataract    bilateral removed  . Chronic combined systolic and diastolic congestive heart failure ([place])   . Chronic renal insufficiency, stage III (moderate) (HCC)   . COPD (chronic obstructive pulmonary disease) ([place])   . Diabetes mellitus without complication ([place])    no meds- boarderline  . Diastolic heart failure ([place])   . Duodenum ulcer    with perforation  . DVT of axillary vein, acute left ([place]) 10/24/2014  . Emphysema   . GERD (gastroesophageal reflux disease)   . H/O hiatal hernia   . High cholesterol   . Hypertension   . Mixed hyperlipidemia   . Myocardial infarction ([place])   . Neuromuscular disorder ([place])    reports shaking, somedays "can't hold spoon" - generalized  shakiness, is going to talk to MD about it at next appt.   . Normal cardiac stress test    reports stress test was several yrs. ago /w [employer]. Dr. Rogelia Mire, WNL   . OSA (obstructive sleep apnea) 2007   CPAP- report of settings on paper chart, seen by [facility] - seen 07/2011   . PE (pulmonary  embolism) 10/2011  . Prostate cancer ([place]) 1992   in remission.  s/p XRT, melanoma under left eye, left temple  . Recurrent upper respiratory infection (URI)    coughing from chronic sinusitis   . Renal stone    some passed spontaneous, & also had cystoscopy  . Shortness of breath on exertion   . Sleep apnea    wears c-pap    Past Surgical History:  Procedure Laterality Date  . CATARACT EXTRACTION W/ INTRAOCULAR LENS  IMPLANT, BILATERAL  ~ 2000  . COLONOSCOPY N/A 04/03/2014   Procedure: COLONOSCOPY;  Surgeon: Inda Castle, MD;  Location: [facility];  Service: Endoscopy;  Laterality: N/A;  . ESOPHAGOGASTRODUODENOSCOPY N/A 08/28/2013    Procedure: ESOPHAGOGASTRODUODENOSCOPY (EGD);  Surgeon: Gayland Curry, MD;  Location: [place];  Service: General;  Laterality: N/A;  . ESOPHAGOGASTRODUODENOSCOPY N/A 04/03/2014   Procedure: ESOPHAGOGASTRODUODENOSCOPY (EGD);  Surgeon: Inda Castle, MD;  Location: [facility];  Service: Endoscopy;  Laterality: N/A;  . GASTROSTOMY N/A 08/28/2013   Procedure: GASTROSTOMY;  Surgeon: Rolm Bookbinder, MD;  Location: [place];  Service: General;  Laterality: N/A;  . LAPAROTOMY N/A 08/28/2013   Procedure: EXPLORATORY LAPAROTOMY;  Surgeon: Rolm Bookbinder, MD;  Location: [place];  Service: General;  Laterality: N/A;  . LOBECTOMY Right 1986   1/3 removed   . LUNG SURGERY  1986   presumed from RLL. Segmentectomy. Benign Nodule.   [name] Kitchen MAXILLARY ANTROSTOMY  09/15/2011   Procedure: MAXILLARY ANTROSTOMY;  Surgeon: Ascencion Dike, MD;  Location: [facility];  Service: ENT;  Laterality: Bilateral;  TOTAL MAXILLARY ANTROSTOMY  . MELANOMA EXCISION Left    under left eye  . REPAIR OF PERFORATED ULCER N/A 08/28/2013   Procedure: DUODENAL ULCER PATCH;  Surgeon: Rolm Bookbinder, MD;  Location: [place];  Service: General;  Laterality: N/A;  . SEPTOPLASTY  09/15/11  . SEPTOPLASTY  09/15/2011   Procedure: SEPTOPLASTY;  Surgeon: Ascencion Dike, MD;  Location: [place];  Service: ENT;  Laterality: Bilateral;  . SINUS ENDO W/FUSION  sch. 09/15/2011   Procedure: ENDOSCOPIC SINUS SURGERY WITH FUSION NAVIGATION;  Surgeon: Ascencion Dike, MD;  Location: [place];  Service: ENT;  Laterality: Bilateral;  bilateral maxillary antrostomy, bilateral endoscopic ethmoidectomy, bilateral sphenoidectomy, bilateral frontal recess exploration  . SINUS ENDO W/FUSION  09/15/2011   Procedure: ENDOSCOPIC SINUS SURGERY WITH FUSION NAVIGATION;  Surgeon: Ascencion Dike, MD;  Location: [place];  Service: ENT;  Laterality: Bilateral;  . SINUS EXPLORATION  09/15/2011   Procedure: SINUS EXPLORATION;  Surgeon: Ascencion Dike, MD;  Location: [place];  Service: ENT;   Laterality: Bilateral;  FRONTAL RECESS EXPLORATION  . SPHENOIDECTOMY  09/15/2011   Procedure: Coralee P[place];  Surgeon: Ascencion Dike, MD;  Location: [place];  Service: ENT;  Laterality: Bilateral;  . URETEROSCOPY  1980's    Current Outpatient Medications  Medication Sig Dispense Refill  . albuterol (PROVENTIL HFA;VENTOLIN HFA) 108 (90 Base) MCG/ACT inhaler INHALE 2 PUFFS BY MOUTH EVERY 6 HOURS AS NEEDED FOR WHEEZING OR SHORTNESS OF BREATH 8.5 g 3  . albuterol (PROVENTIL) (2.5 MG/3ML) 0.083% nebulizer solution Take 2.5 mg by nebulization 4 (four) times daily.    [name] Kitchen atorvastatin (LIPITOR) 10 MG tablet Take 10 mg by mouth daily at 6 PM.     . budesonide-formoterol (SYMBICORT) 160-4.5 MCG/ACT inhaler Inhale 2 puffs into the lungs 2 (two) times daily. 2 Inhaler 0  . ELIQUIS 5 MG TABS tablet TAKE 1 TABLET(5 MG) BY MOUTH TWICE DAILY  180 tablet 0  . esomeprazole (NEXIUM) 40 MG capsule Take 1 capsule (40 mg total) by mouth daily. 90 capsule 6  . ferrous sulfate 220 (44 Fe) MG/5ML solution Take 220 mg by mouth every other day.    . fluocinonide cream (LIDEX) 2.68 % Apply 1 application topically daily as needed (rash). To infected area.    . fluticasone (FLONASE) 50 MCG/ACT nasal spray Place 2 sprays into the nose at bedtime. Each nostril once a day.    . furosemide (LASIX) 40 MG tablet TAKE 1 TABLET(40 MG) BY MOUTH DAILY 90 tablet 3  . ipratropium (ATROVENT) 0.02 % nebulizer solution Take 2.5 mLs (0.5 mg total) by nebulization 4 (four) times daily. 120 mL 12  . ketoconazole (NIZORAL) 2 % cream Apply 1 fingertip amount to bottom of each foot and toenails daily 30 g 0  . metoprolol tartrate (LOPRESSOR) 25 MG tablet Take 0.5 tablets (12.5 mg total) by mouth 2 (two) times daily. 90 tablet 3  . nitroGLYCERIN (NITROSTAT) 0.4 MG SL tablet Place 1 tablet (0.4 mg total) under the tongue every 5 (five) minutes as needed for chest pain. Please keep upcoming appt. thanks 75 tablet 0  . tamsulosin (FLOMAX) 0.4 MG CAPS  capsule Take 0.4 mg by mouth daily.     No current facility-administered medications for this visit.     Allergies  Allergen Reactions  . Mometasone Furoate Other (See Comments)    Nasonex: CAUSED RED,ITCHY EYES; "lost my eyelashes"   . Naproxen Sodium Nausea And Vomiting  . Nsaids Other (See Comments)    Stomach ulcers  . Anoro Ellipta [Umeclidinium-Vilanterol] Other (See Comments)    Double vision  . Aspirin Hives and Nausea And Vomiting     Chills, sweats  . Bevespi Aerosphere [Glycopyrrolate-Formoterol] Hives  . Pulmicort [Budesonide] Hives  . Breo Ellipta [Fluticasone Furoate-Vilanterol] Hives  . Dexilant [Dexlansoprazole] Diarrhea    Cramps   . Omnaris [Ciclesonide] Other (See Comments)    Nose bleed   . Spiriva Handihaler [Tiotropium Bromide Monohydrate] Other (See Comments)    Hiccups     Social History   Socioeconomic History  . Marital status: Married    Spouse name: Not on file  . Number of children: 4  . Years of education: Not on file  . Highest education level: Not on file  Occupational History  . Occupation: 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   Social Needs  . Financial resource strain: Not on file  . Food insecurity:    Worry: Not on file    Inability: Not on file  . Transportation needs:    Medical: Not on file    Non-medical: Not on file  Tobacco Use  . Smoking status: Former Smoker    Packs/day: 1.00    Years: 60.00    Pack years: 60.00    Types: Cigarettes    Last attempt to quit: 08/06/2011    Years since quitting: 6.9  . Smokeless tobacco: Never Used  Substance and Sexual Activity  . Alcohol use: Yes    Alcohol/week: 1.0 standard drinks    Types: 1 Cans of beer per week  . Drug use: No  . Sexual activity: Never  Lifestyle  . Physical activity:    Days per week: Not on file    Minutes per session: Not on file  . Stress: Not on file  Relationships  . Social connections:    Talks on phone: Not on file    Gets together: Not on file  Attends religious service: Not on file    Active member of club or organization: Not on file    Attends meetings of clubs or organizations: Not on file    Relationship status: Not on file  . Intimate partner violence:    Fear of current or ex partner: Not on file    Emotionally abused: Not on file    Physically abused: Not on file    Forced sexual activity: Not on file  Other Topics Concern  . Not on file  Social History Narrative   Traveled to [place].   Married and lives with wife.   Originally from [place].   Spends winters in [place]   Has 2 kids in [place]       Family History  Problem Relation Age of Onset  . Asthma Father   . Heart disease Mother        MI age 5  . Heart attack Mother   . Prostate cancer Brother   . Prostate cancer Brother   . Prostate cancer Brother   . Stroke Sister   . Anesthesia problems Neg Hx   . Colon cancer Neg Hx   . Esophageal cancer Neg Hx   . Rectal cancer Neg Hx   . Stomach cancer Neg Hx     Review of Systems:  As stated in the HPI and otherwise negative.   BP 110/60   Pulse 61   Ht 5\' 8"  (1.727 m)   Wt 168 lb 6.4 oz (76.4 kg)   SpO2 97%   BMI 25.61 kg/m   Physical Examination:  General: Well developed, well nourished, NAD  HEENT: OP clear, mucus membranes moist  SKIN: warm, dry. No rashes. Neuro: No focal deficits  Musculoskeletal: Muscle strength 5/5 all ext  Psychiatric: Mood and affect normal  Neck: No JVD, no carotid bruits, no thyromegaly, no lymphadenopathy.  Lungs:Clear bilaterally, no wheezes, rhonci, crackles Cardiovascular: Regular rate and rhythm. No murmurs, gallops or rubs. Abdomen:Soft. Bowel sounds present. Non-tender.  Extremities: No lower extremity edema. Pulses are 2 + in the bilateral DP/PT.  Echo May 2018:  Left ventricle: Septal and inferior basal hypokinesis. The cavity   size was normal. Systolic function was mildly reduced. The   estimated ejection fraction was in the range of 45% to 50%. Wall    motion was normal; there were no regional wall motion   abnormalities. Doppler parameters are consistent with abnormal   left ventricular relaxation (grade 1 diastolic dysfunction). - Atrial septum: No defect or patent foramen ovale was identified.  EKG:  EKG is ordered today The ekg ordered today demonstrates NSR, rate 61 bpm.   Recent Labs: 07/24/2017: B Natriuretic Peptide 29.0; Hemoglobin 15.5; Platelets 215 07/29/2017: BUN 48; Creatinine, Ser 1.43; Magnesium 1.8; Potassium 4.2; Sodium 137   Lipid Panel    Component Value Date/Time   TRIG 56 08/28/2013 1620     Wt Readings from Last 3 Encounters:  07/10/18 168 lb 6.4 oz (76.4 kg)  06/09/18 167 lb (75.8 kg)  05/08/18 171 lb (77.6 kg)     Other studies Reviewed: Additional studies/ records that were reviewed today include:  Review of the above records demonstrates:    Assessment and Plan:   1. CAD without angina: He has presumed CAD but has had no cath due to issues with prior GI bleeding while on heparin and then DVT/PE requiring Eliquis. No chest pain suggestive of angina. Continue statin and beta blocker.    2. HLD: Lipids well  controlled in primary care. LDL near goal in April 2019. Continue statin  3. DVT/PE: Followed in Pulmonary/primary care. Continue Eliquis  4. PVCs with bigeminy/trigeminy: No palpitations. Continue beta blocker.   5. Chronic diastolic CHF:  He has chronic LE edema. Weight is stable. Continue Lasix.  He has compression stocking at home. He elevates his legs during the day.   6. HTN: BP is well controlled. No changes today  Current medicines are reviewed at length with the patient today.  The patient does not have concerns regarding medicines.  The following changes have been made:  no change  Labs/ tests ordered today include:   Orders Placed This Encounter  Procedures  . EKG 12-Lead    Disposition:   FU with me in 12 months  Signed, Lauree Chandler, MD 07/10/2018 11:53 AM      [facility] [place], [place], [place]  [address] Phone: [phone]; Fax: [phone]

## 2018-07-24 ENCOUNTER — Telehealth: Payer: Self-pay | Admitting: Pulmonary Disease

## 2018-07-24 NOTE — Telephone Encounter (Signed)
He can check out [employer] and we can discuss during follow-up visit

## 2018-07-24 NOTE — Telephone Encounter (Signed)
Ok that's what he mentioned to me. Ok will await information from pt about sending order to [employer].

## 2018-07-24 NOTE — Telephone Encounter (Signed)
Spoke with pt, he states his tanks are too heavy for him to carry and the POC is also too heavy. He asked [facility] if they had a light weight POC and they stated they did not have them. I suggested to see if he could purchase one online and get back to [place] to let us know. He said he had an appt with Dr. Elsworth Soho next month and we could re-visit the POC at that time. RA do you have any other suggestions? Please advise.

## 2018-07-29 ENCOUNTER — Encounter (HOSPITAL_COMMUNITY): Payer: Self-pay | Admitting: *Deleted

## 2018-07-29 ENCOUNTER — Other Ambulatory Visit: Payer: Self-pay

## 2018-07-29 ENCOUNTER — Emergency Department (HOSPITAL_COMMUNITY)
Admission: EM | Admit: 2018-07-29 | Discharge: 2018-07-29 | Disposition: A | Discharge status: Home / Self Care | Payer: Medicare Other | Attending: Emergency Medicine | Admitting: Emergency Medicine

## 2018-07-29 DIAGNOSIS — Z87891 Personal history of nicotine dependence: Secondary | ICD-10-CM | POA: Diagnosis not present

## 2018-07-29 DIAGNOSIS — Z79899 Other long term (current) drug therapy: Secondary | ICD-10-CM | POA: Diagnosis not present

## 2018-07-29 DIAGNOSIS — I2510 Atherosclerotic heart disease of native coronary artery without angina pectoris: Secondary | ICD-10-CM | POA: Diagnosis not present

## 2018-07-29 DIAGNOSIS — J449 Chronic obstructive pulmonary disease, unspecified: Secondary | ICD-10-CM | POA: Diagnosis not present

## 2018-07-29 DIAGNOSIS — N183 Chronic kidney disease, stage 3 (moderate): Secondary | ICD-10-CM | POA: Diagnosis not present

## 2018-07-29 DIAGNOSIS — I130 Hypertensive heart and chronic kidney disease with heart failure and stage 1 through stage 4 chronic kidney disease, or unspecified chronic kidney disease: Secondary | ICD-10-CM | POA: Diagnosis not present

## 2018-07-29 DIAGNOSIS — E1122 Type 2 diabetes mellitus with diabetic chronic kidney disease: Secondary | ICD-10-CM | POA: Diagnosis not present

## 2018-07-29 DIAGNOSIS — Z8546 Personal history of malignant neoplasm of prostate: Secondary | ICD-10-CM | POA: Insufficient documentation

## 2018-07-29 DIAGNOSIS — M436 Torticollis: Secondary | ICD-10-CM | POA: Insufficient documentation

## 2018-07-29 DIAGNOSIS — I5042 Chronic combined systolic (congestive) and diastolic (congestive) heart failure: Secondary | ICD-10-CM | POA: Diagnosis not present

## 2018-07-29 DIAGNOSIS — Z7901 Long term (current) use of anticoagulants: Secondary | ICD-10-CM | POA: Diagnosis not present

## 2018-07-29 DIAGNOSIS — R52 Pain, unspecified: Secondary | ICD-10-CM | POA: Diagnosis not present

## 2018-07-29 DIAGNOSIS — M62838 Other muscle spasm: Secondary | ICD-10-CM | POA: Diagnosis not present

## 2018-07-29 DIAGNOSIS — M542 Cervicalgia: Secondary | ICD-10-CM | POA: Diagnosis not present

## 2018-07-29 MED ORDER — SODIUM CHLORIDE 0.9 % IV SOLN
INTRAVENOUS | Status: DC
  Administered 2018-07-29: 14:00:00 via INTRAVENOUS

## 2018-07-29 MED ORDER — ONDANSETRON HCL 4 MG/2ML IJ SOLN
4.0000 mg | Freq: Once | INTRAMUSCULAR | Status: AC
  Administered 2018-07-29: 4 mg via INTRAVENOUS
  Filled 2018-07-29: qty 2

## 2018-07-29 MED ORDER — LORAZEPAM 2 MG/ML IJ SOLN
1.0000 mg | Freq: Once | INTRAMUSCULAR | Status: AC
  Administered 2018-07-29: 1 mg via INTRAVENOUS
  Filled 2018-07-29: qty 1

## 2018-07-29 MED ORDER — HYDROCODONE-ACETAMINOPHEN 5-325 MG PO TABS: 1.0000 | ORAL_TABLET | Freq: Four times a day (QID) | ORAL | 0 refills | Status: DC | PRN

## 2018-07-29 MED ORDER — HYDROMORPHONE HCL 1 MG/ML IJ SOLN
0.5000 mg | Freq: Once | INTRAMUSCULAR | Status: AC
  Administered 2018-07-29: 0.5 mg via INTRAVENOUS
  Filled 2018-07-29: qty 1

## 2018-07-29 MED ORDER — LORAZEPAM 1 MG PO TABS: 1.0000 mg | ORAL_TABLET | Freq: Three times a day (TID) | ORAL | 0 refills | Status: DC | PRN

## 2018-07-29 NOTE — ED Notes (Signed)
Given meal tray.

## 2018-07-29 NOTE — Discharge Instructions (Addendum)
Continue to take the hydrocodone as directed.  And take the Ativan for as a muscle relaxer.  Make an appointment to follow-up with your doctor.  Also follow-up with your neurosurgeon if symptoms are not improving in 2 weeks.  MRI scan of your neck may be warranted.

## 2018-07-29 NOTE — ED Provider Notes (Signed)
[facility] EMERGENCY DEPARTMENT Provider Note   CSN: [id number] Arrival date & time: 07/29/18  1105    History   Chief Complaint Chief Complaint  Patient presents with  . Neck Pain    HPI Jon White is a 85 y.o. male.     Patient awoke Thursday morning with tight muscle to the lateral aspect of his neck on the right side.  Then yesterday it got tight on the left side as well.  Now it is kind of tight all over both sides and at the base of the skull.  Associated with some nausea no vision changes no numbness or weakness in the upper extremities.  No fevers.  Patient is normally on 2 L of oxygen at home.  No increased shortness of breath no chest pain no abdominal pain.  No low back pain.  Patient has not had symptoms like this in the past.  Many years ago he was evaluated for some pulmonary issues they found a lesion on his cervical spine area which led to MRI and has been followed by Dr. Trenton Gammon here locally for that since then but that is all been stable.  Never had any symptoms from that.     Past Medical History:  Diagnosis Date  . Allergic rhinitis   . Allergy   . Asthma   . Atrial fibrillation ([place])   . Bleeding gastric ulcer   . Blood transfusion without reported diagnosis   . CAD (coronary artery disease) 07/24/2017  . Cataract    bilateral removed  . Chronic combined systolic and diastolic congestive heart failure ([place])   . Chronic renal insufficiency, stage III (moderate) (HCC)   . COPD (chronic obstructive pulmonary disease) ([place])   . Diabetes mellitus without complication ([place])    no meds- boarderline  . Diastolic heart failure ([place])   . Duodenum ulcer    with perforation  . DVT of axillary vein, acute left ([place]) 10/24/2014  . Emphysema   . GERD (gastroesophageal reflux disease)   . H/O hiatal hernia   . High cholesterol   . Hypertension   . Mixed hyperlipidemia   . Myocardial infarction ([place])   . Neuromuscular disorder ([place])    reports shaking,  somedays "can't hold spoon" - generalized  shakiness, is going to talk to MD about it at next appt.   . Normal cardiac stress test    reports stress test was several yrs. ago /w [employer]. Dr. Rogelia Mire, WNL   . OSA (obstructive sleep apnea) 2007   CPAP- report of settings on paper chart, seen by [facility] - seen 07/2011   . PE (pulmonary embolism) 10/2011  . Prostate cancer ([place]) 1992   in remission.  s/p XRT, melanoma under left eye, left temple  . Recurrent upper respiratory infection (URI)    coughing from chronic sinusitis   . Renal stone    some passed spontaneous, & also had cystoscopy  . Shortness of breath on exertion   . Sleep apnea    wears c-pap    Patient Active Problem List   Diagnosis Date Noted  . Chronic respiratory failure with hypoxia ([place]) 05/08/2018  . Chronic combined systolic and diastolic congestive heart failure ([place]) 07/25/2017  . Type 2 diabetes mellitus ([place]) 07/25/2017  . COPD exacerbation ([place]) 07/24/2017  . Mixed hyperlipidemia 07/24/2017  . Atrial fibrillation ([place]) 07/24/2017  . CAD (coronary artery disease) 07/24/2017  . COPD with acute exacerbation ([place]) 06/24/2017  . Upper airway cough syndrome 06/24/2017  .  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 Chronic venous insufficiency 08/29/2015  . Post-phlebitic syndrome 08/29/2015  . Poor circulation of extremity 07/29/2015  . PVC's (premature ventricular contractions) 05/08/2015  . Bradycardia 04/28/2015  . COPD, moderate ([place]) 04/28/2015  . Essential hypertension 11/13/2014  . Other pulmonary embolism without acute cor pulmonale ([place]) 10/25/2014  . CKD (chronic kidney disease), stage III ([place]) 10/25/2014  . DVT (deep venous thrombosis) ([place]) 10/25/2014  . DVT of axillary vein, acute left ([place]) 10/24/2014  . Duodenal ulcer without hemorrhage or perforation and without obstruction 04/03/2014  . Benign esophageal stricture 04/03/2014  . Acute duodenal ulcer with bleeding 03/29/2014  . Perforated viscus  08/28/2013  . Encounter for therapeutic drug monitoring 07/02/2013  . Chronic rhinosinusitis 03/21/2013  . COPD (chronic obstructive pulmonary disease) ([place]) 06/07/2012  . Chronic pulmonary embolism ([place]) 05/19/2012  . PE (pulmonary thromboembolism) ([place]) 05/18/2012  . STRICTURE AND STENOSIS OF ESOPHAGUS 07/31/2008  . G E R D 05/13/2008  . OSA (obstructive sleep apnea) 05/13/2008  . Cough productive of purulent sputum 05/13/2008    Past Surgical History:  Procedure Laterality Date  . CATARACT EXTRACTION W/ INTRAOCULAR LENS  IMPLANT, BILATERAL  ~ 2000  . COLONOSCOPY N/A 04/03/2014   Procedure: COLONOSCOPY;  Surgeon: Inda Castle, MD;  Location: [facility];  Service: Endoscopy;  Laterality: N/A;  . ESOPHAGOGASTRODUODENOSCOPY N/A 08/28/2013   Procedure: ESOPHAGOGASTRODUODENOSCOPY (EGD);  Surgeon: Gayland Curry, MD;  Location: [place];  Service: General;  Laterality: N/A;  . ESOPHAGOGASTRODUODENOSCOPY N/A 04/03/2014   Procedure: ESOPHAGOGASTRODUODENOSCOPY (EGD);  Surgeon: Inda Castle, MD;  Location: [facility];  Service: Endoscopy;  Laterality: N/A;  . GASTROSTOMY N/A 08/28/2013   Procedure: GASTROSTOMY;  Surgeon: Rolm Bookbinder, MD;  Location: [place];  Service: General;  Laterality: N/A;  . LAPAROTOMY N/A 08/28/2013   Procedure: EXPLORATORY LAPAROTOMY;  Surgeon: Rolm Bookbinder, MD;  Location: [place];  Service: General;  Laterality: N/A;  . LOBECTOMY Right 1986   1/3 removed   . LUNG SURGERY  1986   presumed from RLL. Segmentectomy. Benign Nodule.   [name] Kitchen MAXILLARY ANTROSTOMY  09/15/2011   Procedure: MAXILLARY ANTROSTOMY;  Surgeon: Ascencion Dike, MD;  [facility];  Service: ENT;  Laterality: Bilateral;  TOTAL MAXILLARY ANTROSTOMY  . MELANOMA EXCISION Left    under left eye  . REPAIR OF PERFORATED ULCER N/A 08/28/2013   Procedure: DUODENAL ULCER PATCH;  Surgeon: Rolm Bookbinder, MD;  Location: [place];  Service: General;  Laterality: N/A;  . SEPTOPLASTY  09/15/11  . SEPTOPLASTY   09/15/2011   Procedure: SEPTOPLASTY;  Surgeon: Ascencion Dike, MD;  Location: [place];  Service: ENT;  Laterality: Bilateral;  . SINUS ENDO W/FUSION  sch. 09/15/2011   Procedure: ENDOSCOPIC SINUS SURGERY WITH FUSION NAVIGATION;  Surgeon: Ascencion Dike, MD;  Location: [facility];  Service: ENT;  Laterality: Bilateral;  bilateral maxillary antrostomy, bilateral endoscopic ethmoidectomy, bilateral sphenoidectomy, bilateral frontal recess exploration  . SINUS ENDO W/FUSION  09/15/2011   Procedure: ENDOSCOPIC SINUS SURGERY WITH FUSION NAVIGATION;  Surgeon: Ascencion Dike, MD;  Location: [place];  Service: ENT;  Laterality: Bilateral;  . SINUS EXPLORATION  09/15/2011   Procedure: SINUS EXPLORATION;  Surgeon: Ascencion Dike, MD;  Location: [facility];  Service: ENT;  Laterality: Bilateral;  FRONTAL RECESS EXPLORATION  . SPHENOIDECTOMY  09/15/2011   Procedure: Coralee Pesa;  Surgeon: Ascencion Dike, MD;  Location: [facility];  Service: ENT;  Laterality: Bilateral;  . URETEROSCOPY  1980's  Home Medications    Prior to Admission medications   Medication Sig Start Date End Date Taking? Authorizing Provider  albuterol (PROVENTIL HFA;VENTOLIN HFA) 108 (90 Base) MCG/ACT inhaler INHALE 2 PUFFS BY MOUTH EVERY 6 HOURS AS NEEDED FOR WHEEZING OR SHORTNESS OF BREATH 03/02/18   Rigoberto Noel, MD  albuterol (PROVENTIL) (2.5 MG/3ML) 0.083% nebulizer solution Take 2.5 mg by nebulization 4 (four) times daily.    [provider]  atorvastatin (LIPITOR) 10 MG tablet Take 10 mg by mouth daily at 6 PM.     [provider]  budesonide-formoterol (SYMBICORT) 160-4.5 MCG/ACT inhaler Inhale 2 puffs into the lungs 2 (two) times daily. 05/08/18   Parrett, Tammy S, NP  ELIQUIS 5 MG TABS tablet TAKE 1 TABLET(5 MG) BY MOUTH TWICE DAILY 06/27/18   Brand Males, MD  esomeprazole (NEXIUM) 40 MG capsule Take 1 capsule (40 mg total) by mouth daily. 05/02/15   Mauri Pole, MD  ferrous sulfate 220 (44 Fe) MG/5ML solution  Take 220 mg by mouth every other day.    [provider]  fluocinonide cream (LIDEX) 0.35 % Apply 1 application topically daily as needed (rash). To infected area.    [provider]  fluticasone (FLONASE) 50 MCG/ACT nasal spray Place 2 sprays into the nose at bedtime. Each nostril once a day.    [provider]  furosemide (LASIX) 40 MG tablet TAKE 1 TABLET(40 MG) BY MOUTH DAILY 06/09/17   Burnell Blanks, MD  HYDROcodone-acetaminophen (NORCO/VICODIN) 5-325 MG tablet Take 1 tablet by mouth every 6 (six) hours as needed. 07/29/18   Fredia Sorrow, MD  ipratropium (ATROVENT) 0.02 % nebulizer solution Take 2.5 mLs (0.5 mg total) by nebulization 4 (four) times daily. 08/29/17   Rigoberto Noel, MD  ketoconazole (NIZORAL) 2 % cream Apply 1 fingertip amount to bottom of each foot and toenails daily 11/09/17   Evelina Bucy, DPM  LORazepam (ATIVAN) 1 MG tablet Take 1 tablet (1 mg total) by mouth 3 (three) times daily as needed for anxiety. 07/29/18   Fredia Sorrow, MD  metoprolol tartrate (LOPRESSOR) 25 MG tablet Take 0.5 tablets (12.5 mg total) by mouth 2 (two) times daily. 06/09/17   Burnell Blanks, MD  nitroGLYCERIN (NITROSTAT) 0.4 MG SL tablet Place 1 tablet (0.4 mg total) under the tongue every 5 (five) minutes as needed for chest pain. Please keep upcoming appt. thanks 06/06/18   Burnell Blanks, MD  tamsulosin (FLOMAX) 0.4 MG CAPS capsule Take 0.4 mg by mouth daily. 11/06/14   [provider]    Family History Family History  Problem Relation Age of Onset  . Asthma Father   . Heart disease Mother        MI age 60  . Heart attack Mother   . Prostate cancer Brother   . Prostate cancer Brother   . Prostate cancer Brother   . Stroke Sister   . Anesthesia problems Neg Hx   . Colon cancer Neg Hx   . Esophageal cancer Neg Hx   . Rectal cancer Neg Hx   . Stomach cancer Neg Hx     Social History Social History   Tobacco Use  .  Smoking status: Former Smoker    Packs/day: 1.00    Years: 60.00    Pack years: 60.00    Types: Cigarettes    Last attempt to quit: 08/06/2011    Years since quitting: 6.9  . Smokeless tobacco: Never Used  Substance Use Topics  .  Alcohol use: Yes    Alcohol/week: 1.0 standard drinks    Types: 1 Cans of beer per week  . Drug use: No     Allergies   Mometasone furoate; Naproxen sodium; Nsaids; Anoro ellipta [umeclidinium-vilanterol]; Aspirin; Bevespi aerosphere [glycopyrrolate-formoterol]; Pulmicort [budesonide]; Breo ellipta [fluticasone furoate-vilanterol]; Dexilant [dexlansoprazole]; Omnaris [ciclesonide]; and Spiriva handihaler [tiotropium bromide monohydrate]   Review of Systems Review of Systems  Constitutional: Negative for chills and fever.  HENT: Negative for congestion, rhinorrhea and sore throat.   Eyes: Negative for photophobia, pain and visual disturbance.  Respiratory: Negative for cough and shortness of breath.   Cardiovascular: Negative for chest pain and leg swelling.  Gastrointestinal: Positive for nausea. Negative for abdominal pain, diarrhea and vomiting.  Genitourinary: Negative for dysuria.  Musculoskeletal: Positive for back pain. Negative for neck pain.  Skin: Negative for rash.  Neurological: Negative for dizziness, weakness, light-headedness, numbness and headaches.  Hematological: Does not bruise/bleed easily.  Psychiatric/Behavioral: Negative for confusion.     Physical Exam Updated Vital Signs BP 117/72   Pulse 76   Temp 97.7 F (36.5 C) (Oral)   Resp 16   Ht 1.727 m (5\' 8" )   Wt 76.2 kg   SpO2 98%   BMI 25.54 kg/m   Physical Exam Vitals signs and nursing note reviewed.  Constitutional:      Appearance: Normal appearance. He is well-developed.  HENT:     Head: Normocephalic and atraumatic.     Mouth/Throat:     Mouth: Mucous membranes are moist.  Eyes:     Conjunctiva/sclera: Conjunctivae normal.  Neck:     Musculoskeletal: Neck  rigidity and muscular tenderness present.  Cardiovascular:     Rate and Rhythm: Normal rate and regular rhythm.     Heart sounds: No murmur.  Pulmonary:     Effort: Pulmonary effort is normal. No respiratory distress.     Breath sounds: Normal breath sounds.  Abdominal:     Palpations: Abdomen is soft.     Tenderness: There is no abdominal tenderness.  Lymphadenopathy:     Cervical: No cervical adenopathy.  Skin:    General: Skin is warm and dry.     Capillary Refill: Capillary refill takes less than 2 seconds.  Neurological:     General: No focal deficit present.     Mental Status: He is alert and oriented to person, place, and time.     Cranial Nerves: No cranial nerve deficit.     Sensory: No sensory deficit.     Motor: No weakness.      ED Treatments / Results  Labs (all labs ordered are listed, but only abnormal results are displayed) Labs Reviewed - No data to display  EKG None  Radiology No results found.  Procedures Procedures (including critical care time)  Medications Ordered in ED Medications  0.9 %  sodium chloride infusion ( Intravenous New Bag/Given 07/29/18 1344)  ondansetron (ZOFRAN) injection 4 mg (4 mg Intravenous Given 07/29/18 1344)  HYDROmorphone (DILAUDID) injection 0.5 mg (0.5 mg Intravenous Given 07/29/18 1344)  LORazepam (ATIVAN) injection 1 mg (1 mg Intravenous Given 07/29/18 1400)     Initial Impression / Assessment and Plan / ED Course  I have reviewed the triage vital signs and the nursing notes.  Pertinent labs & imaging results that were available during my care of the patient were reviewed by me and considered in my medical decision making (see chart for details).        Symptoms seem to be  consistent with torticollis.  Patient with significant improvement here with some pain medicine and Ativan now able to move neck better but is still has some tightness to it.  Clinically do not feel that this is related to meningitis.  Patient  without any other systemic symptoms.  Is not toxic.  Patient normally on 2 L of oxygen he has oxygen concentrator available at home.  His oxygen sats on 2 L are fine.  Patient will be discharged home on a course of hydrocodone and a course of Ativan.  Make an appointment to follow-up with his primary care doctor for recheck in the next several days.  He also has been followed by neurosurgery in the past not for anything related to this but a long time ago had a spot on his spine.  This with Dr. Trenton Gammon as an option.  If symptoms persist MRI of the cervical spine would be appropriate.  In addition no cervical radiculopathy symptoms.  No numbness or weakness to the upper extremities or hands.  Final Clinical Impressions(s) / ED Diagnoses   Final diagnoses:  Torticollis  Muscle spasms of neck    ED Discharge Orders         Ordered    HYDROcodone-acetaminophen (NORCO/VICODIN) 5-325 MG tablet  Every 6 hours PRN     07/29/18 1426    LORazepam (ATIVAN) 1 MG tablet  3 times daily PRN     07/29/18 1426           Fredia Sorrow, MD 07/29/18 1435

## 2018-07-29 NOTE — ED Triage Notes (Signed)
Pt brought in by [facility] with c/o neck pain since Thursday. Denies injury. Pt unable to move his neck to either side, but is able to move his head up and down some. Denies fever. Denies numbness/tingling in either arms. VSS per EMS.

## 2018-07-29 NOTE — ED Notes (Signed)
Pt placed on 2l o2 vial [place].  o2 dropped to 88-89.  Pt states he wears home O2 prn.

## 2018-08-07 ENCOUNTER — Ambulatory Visit: Payer: Medicare Other | Admitting: Pulmonary Disease

## 2018-08-07 DIAGNOSIS — S139XXA Sprain of joints and ligaments of unspecified parts of neck, initial encounter: Secondary | ICD-10-CM | POA: Diagnosis not present

## 2018-08-09 ENCOUNTER — Ambulatory Visit (INDEPENDENT_AMBULATORY_CARE_PROVIDER_SITE_OTHER): Payer: Medicare Other | Admitting: Pulmonary Disease

## 2018-08-09 ENCOUNTER — Encounter: Payer: Self-pay | Admitting: Pulmonary Disease

## 2018-08-09 ENCOUNTER — Other Ambulatory Visit: Payer: Self-pay

## 2018-08-09 DIAGNOSIS — I2699 Other pulmonary embolism without acute cor pulmonale: Secondary | ICD-10-CM | POA: Diagnosis not present

## 2018-08-09 DIAGNOSIS — I2510 Atherosclerotic heart disease of native coronary artery without angina pectoris: Secondary | ICD-10-CM | POA: Diagnosis not present

## 2018-08-09 DIAGNOSIS — J449 Chronic obstructive pulmonary disease, unspecified: Secondary | ICD-10-CM

## 2018-08-09 DIAGNOSIS — G4733 Obstructive sleep apnea (adult) (pediatric): Secondary | ICD-10-CM | POA: Diagnosis not present

## 2018-08-09 MED ORDER — BUDESONIDE-FORMOTEROL FUMARATE 160-4.5 MCG/ACT IN AERO: 2.0000 | INHALATION_SPRAY | Freq: Two times a day (BID) | RESPIRATORY_TRACT | 0 refills | Status: DC

## 2018-08-09 NOTE — Progress Notes (Signed)
   Subjective:    Patient ID: Jon White, male    DOB: 15-Nov-1932, 85 y.o.   MRN: [id number]  HPI  85 year old  former smoker followed for COPD and obstructive sleep apnea Past medical history significant for right lower lobectomy for benign lesion,  recurrent DVT PE on anticoagulation, coronary disease, chronic systolic heart failure and chronic kidney disease  Chief Complaint  Patient presents with  . Follow-up    3 month follow up for COPD and OSA.    On his last visit, CPAP pressure was turned down from 13 to 12 cm.  He likes this pressure better. He had the power go out 1 time and humidity setting was reset to 88% for some reason.  He had water coming into the tubing and he had increased phlegm production.  Once he sat the setting back down to 62% things got better. Compliant with his machine and this is helping Download was reviewed which shows residual AHI 5/hour, slightly increased from prior after dropping pressure but acceptable, good compliance and minimal leak   Breathing is at baseline, denies cough.  Has intermittent wheezing when he has phlegm Maintained on a regimen of Symbicort and Atrovent nebs she has cut down to twice daily Inquires about dosing for Eliquis, resection of warts on his face as planned  He has increased pedal edema in spite of taking 60 mg of Lasix daily  Significant tests/ events  PFTs 2014 FEV1 42%-1.08,significant bronchodilator response with improvement to 59%  NPSG 2008: AHI 11/hr, but no supine sleep.  Titration study: cpap 8cm.  Auto 06/2011: Optimal pressure 12cm.  Review of Systems neg for any significant sore throat, dysphagia, itching, sneezing, nasal congestion or excess/ purulent secretions, fever, chills, sweats, unintended wt loss, pleuritic or exertional cp, hempoptysis, orthopnea pnd or change in chronic leg swelling. Also denies presyncope, palpitations, heartburn, abdominal pain, nausea, vomiting, diarrhea or change in  bowel or urinary habits, dysuria,hematuria, rash, arthralgias, visual complaints, headache, numbness weakness or ataxia.     Objective:   Physical Exam   Gen. Pleasant, obese, in no distress ENT - no lesions, no post nasal drip Neck: No JVD, no thyromegaly, no carotid bruits Lungs: no use of accessory muscles, no dullness to percussion, decreased without rales or rhonchi  Cardiovascular: Rhythm regular, heart sounds  normal, no murmurs or gallops, no peripheral edema Musculoskeletal: No deformities, no cyanosis or clubbing , no tremors         Assessment & Plan:

## 2018-08-09 NOTE — Assessment & Plan Note (Signed)
CPAP is working well on lower pressure of 12 cm. He is compliant  Weight loss encouraged, compliance with goal of at least 4-6 hrs every night is the expectation. Advised against medications with sedative side effects Cautioned against driving when sleepy - understanding that sleepiness will vary on a day to day basis

## 2018-08-09 NOTE — Addendum Note (Signed)
Addended by: Valerie Salts on: 08/09/2018 02:07 PM   Modules accepted: Orders

## 2018-08-09 NOTE — Assessment & Plan Note (Signed)
Continue Symbicort and Atrovent nebs We discussed signs and symptoms of flare and he will call us as needed

## 2018-08-09 NOTE — Assessment & Plan Note (Signed)
He is on chronic anticoagulation. Okay to stop Eliquis 2 days prior to surgery

## 2018-08-09 NOTE — Patient Instructions (Addendum)
Continue Symbicort and Atrovent nebs CPAP is working well on 12 cm

## 2018-08-17 DIAGNOSIS — C4442 Squamous cell carcinoma of skin of scalp and neck: Secondary | ICD-10-CM | POA: Diagnosis not present

## 2018-08-17 DIAGNOSIS — C44329 Squamous cell carcinoma of skin of other parts of face: Secondary | ICD-10-CM | POA: Diagnosis not present

## 2018-08-17 DIAGNOSIS — D0439 Carcinoma in situ of skin of other parts of face: Secondary | ICD-10-CM | POA: Diagnosis not present

## 2018-08-18 ENCOUNTER — Other Ambulatory Visit: Payer: Self-pay | Admitting: Cardiovascular Disease

## 2018-08-21 ENCOUNTER — Other Ambulatory Visit: Payer: Self-pay | Admitting: Pulmonary Disease

## 2018-08-22 DIAGNOSIS — C4442 Squamous cell carcinoma of skin of scalp and neck: Secondary | ICD-10-CM | POA: Diagnosis not present

## 2018-09-01 DIAGNOSIS — C44319 Basal cell carcinoma of skin of other parts of face: Secondary | ICD-10-CM | POA: Diagnosis not present

## 2018-09-01 DIAGNOSIS — D485 Neoplasm of uncertain behavior of skin: Secondary | ICD-10-CM | POA: Diagnosis not present

## 2018-09-20 ENCOUNTER — Other Ambulatory Visit: Payer: Self-pay | Admitting: Internal Medicine

## 2018-09-22 ENCOUNTER — Encounter: Payer: Self-pay | Admitting: Podiatry

## 2018-09-22 ENCOUNTER — Ambulatory Visit (INDEPENDENT_AMBULATORY_CARE_PROVIDER_SITE_OTHER): Payer: Medicare Other | Admitting: Podiatry

## 2018-09-22 ENCOUNTER — Other Ambulatory Visit: Payer: Self-pay

## 2018-09-22 VITALS — Temp 97.9°F

## 2018-09-22 DIAGNOSIS — B351 Tinea unguium: Secondary | ICD-10-CM | POA: Diagnosis not present

## 2018-09-22 DIAGNOSIS — M79675 Pain in left toe(s): Secondary | ICD-10-CM

## 2018-09-22 DIAGNOSIS — M79674 Pain in right toe(s): Secondary | ICD-10-CM

## 2018-09-22 DIAGNOSIS — Z9229 Personal history of other drug therapy: Secondary | ICD-10-CM

## 2018-09-22 DIAGNOSIS — E1151 Type 2 diabetes mellitus with diabetic peripheral angiopathy without gangrene: Secondary | ICD-10-CM | POA: Diagnosis not present

## 2018-09-22 NOTE — Patient Instructions (Signed)

## 2018-09-25 ENCOUNTER — Encounter: Payer: Self-pay | Admitting: Podiatry

## 2018-09-25 NOTE — Progress Notes (Signed)
Subjective: Jon White is a 85 y.o. y.o. male with diabetes, who is on long term blood thinner Eliquis,  presents today with painful, discolored, thick toenails  which interfere with daily activities. Pain is aggravated when wearing enclosed shoe gear. Pain is relieved with periodic professional debridement.  Patient's PCP is Merrilee Seashore, MD.   Current Outpatient Medications:  .  albuterol (PROVENTIL) (2.5 MG/3ML) 0.083% nebulizer solution, Take 2.5 mg by nebulization 4 (four) times daily., Disp: , Rfl:  .  atorvastatin (LIPITOR) 10 MG tablet, Take 10 mg by mouth daily at 6 PM. , Disp: , Rfl:  .  budesonide-formoterol (SYMBICORT) 160-4.5 MCG/ACT inhaler, Inhale 2 puffs into the lungs 2 (two) times daily., Disp: 2 Inhaler, Rfl: 0 .  budesonide-formoterol (SYMBICORT) 160-4.5 MCG/ACT inhaler, Inhale 2 puffs into the lungs 2 (two) times daily., Disp: 2 Inhaler, Rfl: 0 .  diazepam (VALIUM) 2 MG tablet, TK 1 T PO QD IN THE MORNING PRN, Disp: , Rfl:  .  ELIQUIS 5 MG TABS tablet, TAKE 1 TABLET(5 MG) BY MOUTH TWICE DAILY, Disp: 180 tablet, Rfl: 0 .  esomeprazole (NEXIUM) 40 MG capsule, Take 1 capsule (40 mg total) by mouth daily., Disp: 90 capsule, Rfl: 6 .  ferrous sulfate 220 (44 Fe) MG/5ML solution, Take 220 mg by mouth every other day., Disp: , Rfl:  .  fluocinonide cream (LIDEX) 1.50 %, Apply 1 application topically daily as needed (rash). To infected area., Disp: , Rfl:  .  fluticasone (FLONASE) 50 MCG/ACT nasal spray, Place 2 sprays into the nose at bedtime. Each nostril once a day., Disp: , Rfl:  .  furosemide (LASIX) 40 MG tablet, TAKE 1 TABLET(40 MG) BY MOUTH DAILY, Disp: 90 tablet, Rfl: 3 .  ipratropium (ATROVENT) 0.02 % nebulizer solution, Take 2.5 mLs (0.5 mg total) by nebulization 4 (four) times daily., Disp: 120 mL, Rfl: 12 .  ketoconazole (NIZORAL) 2 % cream, Apply 1 fingertip amount to bottom of each foot and toenails daily, Disp: 30 g, Rfl: 0 .  metoprolol tartrate  (LOPRESSOR) 25 MG tablet, TAKE 1/2 TABLET(12.5 MG) BY MOUTH TWICE DAILY, Disp: 90 tablet, Rfl: 3 .  nitroGLYCERIN (NITROSTAT) 0.4 MG SL tablet, Place 1 tablet (0.4 mg total) under the tongue every 5 (five) minutes as needed for chest pain. Please keep upcoming appt. thanks, Disp: 75 tablet, Rfl: 0 .  PROAIR HFA 108 (90 Base) MCG/ACT inhaler, INHALE 2 PUFFS BY MOUTH EVERY 6 HOURS AS NEEDED FOR WHEEZING OR SHORTNESS OF BREATH, Disp: 8.5 g, Rfl: 3 .  tamsulosin (FLOMAX) 0.4 MG CAPS capsule, Take 0.4 mg by mouth daily., Disp: , Rfl:  .  tiZANidine (ZANAFLEX) 4 MG tablet, TK 1 T PO HS PRN, Disp: , Rfl:    Allergies  Allergen Reactions  . Mometasone Furoate Other (See Comments)    Nasonex: CAUSED RED,ITCHY EYES; "lost my eyelashes"   . Naproxen Sodium Nausea And Vomiting  . Nsaids Other (See Comments)    Stomach ulcers  . Anoro Ellipta [Umeclidinium-Vilanterol] Other (See Comments)    Double vision  . Aspirin Hives and Nausea And Vomiting     Chills, sweats  . Bevespi Aerosphere [Glycopyrrolate-Formoterol] Hives  . Pulmicort [Budesonide] Hives  . Breo Ellipta [Fluticasone Furoate-Vilanterol] Hives  . Dexilant [Dexlansoprazole] Diarrhea    Cramps   . Omnaris [Ciclesonide] Other (See Comments)    Nose bleed   . Spiriva Handihaler [Tiotropium Bromide Monohydrate] Other (See Comments)    Hiccups      Objective: Vascular  Examination: Capillary refill time < 3 seconds x 10 digits.  Dorsalis pedis pulses 1/4 b/l.  Posterior tibial pulses absent b/l.  No digital hair x 10 digits.  Skin temperature gradient warm to cool b/l.  Dermatological Examination: Skin with normal turgor, texture and tone b/l.  Toenails 1-5 b/l discolored, thick, dystrophic with subungual debris and pain with palpation to nailbeds due to thickness of nails.  No open wounds b/l.  No interdigital macerations noted b/l.  Musculoskeletal: Muscle strength 5/5 to all LE muscle groups.  Neurological: Sensation  intact with 10 gram monofilament.  Vibratory sensation intact.  Assessment: Painful onychomycosis toenails 1-5 b/l in patient on blood thinner.   Plan: 1. Toenails 1-5 b/l were debrided in length and girth without iatrogenic bleeding. 2. Patient to continue soft, supportive shoe gear daily. 3. Patient to report any pedal injuries to medical professional immediately. 4. Avoid self trimming due to use of blood thinner. 5. Follow up 3 months.  6. Patient/POA to call should there be a concern in the interim.

## 2018-10-19 DIAGNOSIS — Z0000 Encounter for general adult medical examination without abnormal findings: Secondary | ICD-10-CM | POA: Diagnosis not present

## 2018-10-19 DIAGNOSIS — E782 Mixed hyperlipidemia: Secondary | ICD-10-CM | POA: Diagnosis not present

## 2018-10-19 DIAGNOSIS — J449 Chronic obstructive pulmonary disease, unspecified: Secondary | ICD-10-CM | POA: Diagnosis not present

## 2018-10-19 DIAGNOSIS — Z7189 Other specified counseling: Secondary | ICD-10-CM | POA: Diagnosis not present

## 2018-10-19 DIAGNOSIS — E119 Type 2 diabetes mellitus without complications: Secondary | ICD-10-CM | POA: Diagnosis not present

## 2018-10-19 DIAGNOSIS — I2510 Atherosclerotic heart disease of native coronary artery without angina pectoris: Secondary | ICD-10-CM | POA: Diagnosis not present

## 2018-10-19 DIAGNOSIS — I5042 Chronic combined systolic (congestive) and diastolic (congestive) heart failure: Secondary | ICD-10-CM | POA: Diagnosis not present

## 2018-10-26 DIAGNOSIS — I5042 Chronic combined systolic (congestive) and diastolic (congestive) heart failure: Secondary | ICD-10-CM | POA: Diagnosis not present

## 2018-10-26 DIAGNOSIS — I130 Hypertensive heart and chronic kidney disease with heart failure and stage 1 through stage 4 chronic kidney disease, or unspecified chronic kidney disease: Secondary | ICD-10-CM | POA: Diagnosis not present

## 2018-10-26 DIAGNOSIS — E119 Type 2 diabetes mellitus without complications: Secondary | ICD-10-CM | POA: Diagnosis not present

## 2018-10-26 DIAGNOSIS — J449 Chronic obstructive pulmonary disease, unspecified: Secondary | ICD-10-CM | POA: Diagnosis not present

## 2018-10-26 DIAGNOSIS — I214 Non-ST elevation (NSTEMI) myocardial infarction: Secondary | ICD-10-CM | POA: Diagnosis not present

## 2018-10-26 DIAGNOSIS — I2699 Other pulmonary embolism without acute cor pulmonale: Secondary | ICD-10-CM | POA: Diagnosis not present

## 2018-10-26 DIAGNOSIS — I2510 Atherosclerotic heart disease of native coronary artery without angina pectoris: Secondary | ICD-10-CM | POA: Diagnosis not present

## 2018-10-26 DIAGNOSIS — Z7189 Other specified counseling: Secondary | ICD-10-CM | POA: Diagnosis not present

## 2018-10-26 DIAGNOSIS — E782 Mixed hyperlipidemia: Secondary | ICD-10-CM | POA: Diagnosis not present

## 2018-11-06 ENCOUNTER — Ambulatory Visit: Payer: Medicare Other | Admitting: Adult Health

## 2018-12-11 DIAGNOSIS — C44319 Basal cell carcinoma of skin of other parts of face: Secondary | ICD-10-CM | POA: Diagnosis not present

## 2018-12-22 ENCOUNTER — Ambulatory Visit (INDEPENDENT_AMBULATORY_CARE_PROVIDER_SITE_OTHER): Payer: Medicare Other | Admitting: Podiatry

## 2018-12-22 ENCOUNTER — Other Ambulatory Visit: Payer: Self-pay

## 2018-12-22 ENCOUNTER — Encounter: Payer: Self-pay | Admitting: Podiatry

## 2018-12-22 ENCOUNTER — Other Ambulatory Visit: Payer: Self-pay | Admitting: Internal Medicine

## 2018-12-22 DIAGNOSIS — M79674 Pain in right toe(s): Secondary | ICD-10-CM

## 2018-12-22 DIAGNOSIS — B351 Tinea unguium: Secondary | ICD-10-CM

## 2018-12-22 DIAGNOSIS — M79675 Pain in left toe(s): Secondary | ICD-10-CM | POA: Diagnosis not present

## 2018-12-22 DIAGNOSIS — E1151 Type 2 diabetes mellitus with diabetic peripheral angiopathy without gangrene: Secondary | ICD-10-CM

## 2018-12-22 NOTE — Patient Instructions (Signed)
Diabetes Mellitus and Foot Care Foot care is an important part of your health, especially when you have diabetes. Diabetes may cause you to have problems because of poor blood flow (circulation) to your feet and legs, which can cause your skin to:  Become thinner and drier.  Break more easily.  Heal more slowly.  Peel and crack. You may also have nerve damage (neuropathy) in your legs and feet, causing decreased feeling in them. This means that you may not notice minor injuries to your feet that could lead to more serious problems. Noticing and addressing any potential problems early is the best way to prevent future foot problems. How to care for your feet Foot hygiene  Wash your feet daily with warm water and mild soap. Do not use hot water. Then, pat your feet and the areas between your toes until they are completely dry. Do not soak your feet as this can dry your skin.  Trim your toenails straight across. Do not dig under them or around the cuticle. File the edges of your nails with an emery board or nail file.  Apply a moisturizing lotion or petroleum jelly to the skin on your feet and to dry, brittle toenails. Use lotion that does not contain alcohol and is unscented. Do not apply lotion between your toes. Shoes and socks  Wear clean socks or stockings every day. Make sure they are not too tight. Do not wear knee-high stockings since they may decrease blood flow to your legs.  Wear shoes that fit properly and have enough cushioning. Always look in your shoes before you put them on to be sure there are no objects inside.  To break in new shoes, wear them for just a few hours a day. This prevents injuries on your feet. Wounds, scrapes, corns, and calluses  Check your feet daily for blisters, cuts, bruises, sores, and redness. If you cannot see the bottom of your feet, use a mirror or ask someone for help.  Do not cut corns or calluses or try to remove them with medicine.  If you  find a minor scrape, cut, or break in the skin on your feet, keep it and the skin around it clean and dry. You may clean these areas with mild soap and water. Do not clean the area with peroxide, alcohol, or iodine.  If you have a wound, scrape, corn, or callus on your foot, look at it several times a day to make sure it is healing and not infected. Check for: ? Redness, swelling, or pain. ? Fluid or blood. ? Warmth. ? Pus or a bad smell. General instructions  Do not cross your legs. This may decrease blood flow to your feet.  Do not use heating pads or hot water bottles on your feet. They may burn your skin. If you have lost feeling in your feet or legs, you may not know this is happening until it is too late.  Protect your feet from hot and cold by wearing shoes, such as at the beach or on hot pavement.  Schedule a complete foot exam at least once a year (annually) or more often if you have foot problems. If you have foot problems, report any cuts, sores, or bruises to your health care provider immediately. Contact a health care provider if:  You have a medical condition that increases your risk of infection and you have any cuts, sores, or bruises on your feet.  You have an injury that is not   healing.  You have redness on your legs or feet.  You feel burning or tingling in your legs or feet.  You have pain or cramps in your legs and feet.  Your legs or feet are numb.  Your feet always feel cold.  You have pain around a toenail. Get help right away if:  You have a wound, scrape, corn, or callus on your foot and: ? You have pain, swelling, or redness that gets worse. ? You have fluid or blood coming from the wound, scrape, corn, or callus. ? Your wound, scrape, corn, or callus feels warm to the touch. ? You have pus or a bad smell coming from the wound, scrape, corn, or callus. ? You have a fever. ? You have a red line going up your leg. Summary  Check your feet every day  for cuts, sores, red spots, swelling, and blisters.  Moisturize feet and legs daily.  Wear shoes that fit properly and have enough cushioning.  If you have foot problems, report any cuts, sores, or bruises to your health care provider immediately.  Schedule a complete foot exam at least once a year (annually) or more often if you have foot problems. This information is not intended to replace advice given to you by your health care provider. Make sure you discuss any questions you have with your health care provider. Document Released: 05/14/2000 Document Revised: 06/29/2017 Document Reviewed: 06/18/2016 Elsevier Patient Education  2020 [employer] Inc.   Onychomycosis/Fungal Toenails  WHAT IS IT? An infection that lies within the keratin of your nail plate that is caused by a fungus.  WHY ME? Fungal infections affect all ages, sexes, races, and creeds.  There may be many factors that predispose you to a fungal infection such as age, coexisting medical conditions such as diabetes, or an autoimmune disease; stress, medications, fatigue, genetics, etc.  Bottom line: fungus thrives in a warm, moist environment and your shoes offer such a location.  IS IT CONTAGIOUS? Theoretically, yes.  You do not want to share shoes, nail clippers or files with someone who has fungal toenails.  Walking around barefoot in the same room or sleeping in the same bed is unlikely to transfer the organism.  It is important to realize, however, that fungus can spread easily from one nail to the next on the same foot.  HOW DO WE TREAT THIS?  There are several ways to treat this condition.  Treatment may depend on many factors such as age, medications, pregnancy, liver and kidney conditions, etc.  It is best to ask your doctor which options are available to you.  1. No treatment.   Unlike many other medical concerns, you can live with this condition.  However for many people this can be a painful condition and may lead to  ingrown toenails or a bacterial infection.  It is recommended that you keep the nails cut short to help reduce the amount of fungal nail. 2. Topical treatment.  These range from herbal remedies to prescription strength nail lacquers.  About 40-50% effective, topicals require twice daily application for approximately 9 to 12 months or until an entirely new nail has grown out.  The most effective topicals are medical grade medications available through physicians offices. 3. Oral antifungal medications.  With an 80-90% cure rate, the most common oral medication requires 3 to 4 months of therapy and stays in your system for a year as the new nail grows out.  Oral antifungal medications do require   blood work to make sure it is a safe drug for you.  A liver function panel will be performed prior to starting the medication and after the first month of treatment.  It is important to have the blood work performed to avoid any harmful side effects.  In general, this medication safe but blood work is required. 4. Laser Therapy.  This treatment is performed by applying a specialized laser to the affected nail plate.  This therapy is noninvasive, fast, and non-painful.  It is not covered by insurance and is therefore, out of pocket.  The results have been very good with a 80-95% cure rate.  [facility] is the only practice in the area to offer this therapy. 5. Permanent Nail Avulsion.  Removing the entire nail so that a new nail will not grow back. 

## 2018-12-25 NOTE — Progress Notes (Signed)
Subjective:  Jon White presents to clinic today with cc of  painful, thick, discolored, elongated toenails 1-5 b/l that become tender and cannot cut because of thickness. Pain is aggravated when wearing enclosed shoe gear.  Merrilee Seashore, MD is his PCP.    Current Outpatient Medications:  .  albuterol (PROVENTIL) (2.5 MG/3ML) 0.083% nebulizer solution, Take 2.5 mg by nebulization 4 (four) times daily., Disp: , Rfl:  .  atorvastatin (LIPITOR) 10 MG tablet, Take 10 mg by mouth daily at 6 PM. , Disp: , Rfl:  .  budesonide-formoterol (SYMBICORT) 160-4.5 MCG/ACT inhaler, Inhale 2 puffs into the lungs 2 (two) times daily., Disp: 2 Inhaler, Rfl: 0 .  budesonide-formoterol (SYMBICORT) 160-4.5 MCG/ACT inhaler, Inhale 2 puffs into the lungs 2 (two) times daily., Disp: 2 Inhaler, Rfl: 0 .  diazepam (VALIUM) 2 MG tablet, TK 1 T PO QD IN THE MORNING PRN, Disp: , Rfl:  .  ELIQUIS 5 MG TABS tablet, TAKE 1 TABLET(5 MG) BY MOUTH TWICE DAILY, Disp: 180 tablet, Rfl: 0 .  esomeprazole (NEXIUM) 40 MG capsule, Take 1 capsule (40 mg total) by mouth daily., Disp: 90 capsule, Rfl: 6 .  ferrous sulfate 220 (44 Fe) MG/5ML solution, Take 220 mg by mouth every other day., Disp: , Rfl:  .  fluocinonide cream (LIDEX) 2.72 %, Apply 1 application topically daily as needed (rash). To infected area., Disp: , Rfl:  .  fluticasone (FLONASE) 50 MCG/ACT nasal spray, Place 2 sprays into the nose at bedtime. Each nostril once a day., Disp: , Rfl:  .  furosemide (LASIX) 40 MG tablet, TAKE 1 TABLET(40 MG) BY MOUTH DAILY, Disp: 90 tablet, Rfl: 3 .  ipratropium (ATROVENT) 0.02 % nebulizer solution, Take 2.5 mLs (0.5 mg total) by nebulization 4 (four) times daily., Disp: 120 mL, Rfl: 12 .  ketoconazole (NIZORAL) 2 % cream, Apply 1 fingertip amount to bottom of each foot and toenails daily, Disp: 30 g, Rfl: 0 .  metoprolol tartrate (LOPRESSOR) 25 MG tablet, TAKE 1/2 TABLET(12.5 MG) BY MOUTH TWICE DAILY, Disp: 90 tablet, Rfl:  3 .  nitroGLYCERIN (NITROSTAT) 0.4 MG SL tablet, Place 1 tablet (0.4 mg total) under the tongue every 5 (five) minutes as needed for chest pain. Please keep upcoming appt. thanks, Disp: 75 tablet, Rfl: 0 .  predniSONE (DELTASONE) 10 MG tablet, TK 1 T PO BID UTD, Disp: , Rfl:  .  PROAIR HFA 108 (90 Base) MCG/ACT inhaler, INHALE 2 PUFFS BY MOUTH EVERY 6 HOURS AS NEEDED FOR WHEEZING OR SHORTNESS OF BREATH, Disp: 8.5 g, Rfl: 3 .  tamsulosin (FLOMAX) 0.4 MG CAPS capsule, Take 0.4 mg by mouth daily., Disp: , Rfl:  .  tiZANidine (ZANAFLEX) 4 MG tablet, TK 1 T PO HS PRN, Disp: , Rfl:    Allergies  Allergen Reactions  . Mometasone Furoate Other (See Comments)    Nasonex: CAUSED RED,ITCHY EYES; "lost my eyelashes"   . Naproxen Sodium Nausea And Vomiting  . Nsaids Other (See Comments)    Stomach ulcers  . Anoro Ellipta [Umeclidinium-Vilanterol] Other (See Comments)    Double vision  . Aspirin Hives and Nausea And Vomiting     Chills, sweats  . Bevespi Aerosphere [Glycopyrrolate-Formoterol] Hives  . Pulmicort [Budesonide] Hives  . Breo Ellipta [Fluticasone Furoate-Vilanterol] Hives  . Dexilant [Dexlansoprazole] Diarrhea    Cramps   . Omnaris [Ciclesonide] Other (See Comments)    Nose bleed   . Spiriva Handihaler [Tiotropium Bromide Monohydrate] Other (See Comments)    Hiccups  Objective: Physical Examination:  Vascular Examination: Capillary refill time <3 seconds x 10 digits.  DP pulses 1/4 b/l.  PT pulses 0/4 b/l.  Digital hair absent b/l.  Skin temperature gradient warm to cool b/l.  Dermatological Examination: Skin with normal turgor, texture and tone b/l.  No open wounds b/l.  No interdigital macerations noted b/l.  Elongated, thick, discolored brittle toenails with subungual debris and pain on dorsal palpation of nailbeds 1-5 b/l.  Musculoskeletal Examination: Muscle strength 5/5 to all muscle groups b/l.  No pain, crepitus or joint discomfort with active/passive  ROM.  Neurological Examination: Sensation intact 5/5 b/l with 10 gram monofilament.  Vibratory sensation intact b/l.  Assessment: Mycotic nail infection with pain 1-5 b/l  Plan: 1. Toenails 1-5 b/l were debrided in length and girth without iatrogenic laceration. 2.  Continue soft, supportive shoe gear daily. 3.  Report any pedal injuries to medical professional. 4.  Follow up 3 months. 5.  Patient/POA to call should there be a question/concern in there interim.

## 2019-01-19 ENCOUNTER — Other Ambulatory Visit: Payer: Self-pay | Admitting: Adult Health

## 2019-01-30 DIAGNOSIS — Z23 Encounter for immunization: Secondary | ICD-10-CM | POA: Diagnosis not present

## 2019-02-14 ENCOUNTER — Telehealth: Payer: Self-pay | Admitting: Pulmonary Disease

## 2019-02-14 NOTE — Telephone Encounter (Signed)
Called and spoke to patient.  Patient stated that he has the smallest tanks that [employer] offers and that it is too heavy for him.  Patient would like a small portable concentrator, order for POC was placed 06/26/2018.  Patient stated he called [facility] and was told they don't have any currently.  I attempted to call [facility] but they are closed for lunch.  Will try to call back at a later time.

## 2019-02-14 NOTE — Telephone Encounter (Signed)
Called and spoke to [employer].  Patient currently has one of the lightest models they carry. Patient's POC per [facility] is 6 lbs.  They suggest if patient is having trouble carrying it around that maybe he could use a walker to hang it on.  I called and spoke to patient and let him know that he was in the lightest category. Patient stated it is very heavy for him and he would like to talk with NP about options.  Patient wanted to schedule an OV with Rexene Edison, NP as he has seen her previously.  Patient would also like to talk with her about medications. Patient stated that his daughter works with a respiratory therapist who recommended Duoneb.  Patient wanted to know if that would be covered for him.  Advised patient that he should check with the insurance company to see if that would be covered on his formulary prior to the visit.  Patient stated he would do that.  Scheduled patient for OV and let him know we are still screening for COVID and not allowing visitors at this time. Nothing further needed at this time.

## 2019-02-21 ENCOUNTER — Ambulatory Visit (INDEPENDENT_AMBULATORY_CARE_PROVIDER_SITE_OTHER): Payer: Medicare Other | Admitting: Adult Health

## 2019-02-21 ENCOUNTER — Ambulatory Visit (INDEPENDENT_AMBULATORY_CARE_PROVIDER_SITE_OTHER): Payer: Medicare Other

## 2019-02-21 ENCOUNTER — Other Ambulatory Visit: Payer: Self-pay

## 2019-02-21 ENCOUNTER — Encounter: Payer: Self-pay | Admitting: Adult Health

## 2019-02-21 VITALS — BP 126/72 | HR 82 | Temp 97.2°F | Ht 68.0 in | Wt 168.0 lb

## 2019-02-21 DIAGNOSIS — J9611 Chronic respiratory failure with hypoxia: Secondary | ICD-10-CM | POA: Diagnosis not present

## 2019-02-21 DIAGNOSIS — I2699 Other pulmonary embolism without acute cor pulmonale: Secondary | ICD-10-CM | POA: Diagnosis not present

## 2019-02-21 DIAGNOSIS — J449 Chronic obstructive pulmonary disease, unspecified: Secondary | ICD-10-CM

## 2019-02-21 DIAGNOSIS — I2510 Atherosclerotic heart disease of native coronary artery without angina pectoris: Secondary | ICD-10-CM | POA: Diagnosis not present

## 2019-02-21 DIAGNOSIS — G4733 Obstructive sleep apnea (adult) (pediatric): Secondary | ICD-10-CM

## 2019-02-21 MED ORDER — BUDESONIDE-FORMOTEROL FUMARATE 160-4.5 MCG/ACT IN AERO: 2.0000 | INHALATION_SPRAY | Freq: Two times a day (BID) | RESPIRATORY_TRACT | 5 refills | Status: DC

## 2019-02-21 MED ORDER — BUDESONIDE-FORMOTEROL FUMARATE 160-4.5 MCG/ACT IN AERO: 2.0000 | INHALATION_SPRAY | Freq: Two times a day (BID) | RESPIRATORY_TRACT | 0 refills | Status: DC

## 2019-02-21 NOTE — Patient Instructions (Addendum)
Continue on Symbicort 2 puffs twice daily, rinse after use Continue on Albuterol Ipratropium nebulizer 3 times daily Mucinex DM Twice daily  As needed  Cough/congestion  Continue on CPAP at bedtime.  Continue on Oxygen 2l/m with activity .  Chest xray today  Follow-up in 4 -6 months with Dr. Elsworth Soho and as needed

## 2019-02-21 NOTE — Progress Notes (Signed)
@Patient  ID: Jon White, male    DOB: September 21, 1932, 85 y.o.   MRN: [id number]  Chief Complaint  Patient presents with  . 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  Titration study: cpap 8cm.  Auto 06/2011: Optimal pressure 12cm.  02/21/2019 Follow up : COPD and OSA  Patient returns for a 40-month follow-up.  Patient has underlying severe COPD.  He remains on Symbicort twice daily.  Is on DuoNeb uses it about 3 times a day. Says that he has been doing well without any flare of cough or wheezing.  Has not been on any recent antibiotics. He remains on oxygen 2 L with activity.  Says he is doing well with oxygen.  Does have a portable oxygen concentrator which he says he uses most of the time.   Patient has underlying sleep apnea is on nocturnal CPAP.  Says he is doing well on CPAP.  Wears it every night gets in about 9 hours of sleep.  CPAP download shows excellent compliance with 100% usage.  Daily average usage at 9.5 hours.  Patient is on CPAP 12 cm H2O.  AHI 1.7.  Minimum leaks.  Patient says he remains very active at home.  He works on [employer].  Is able to mow the grass and actually is able to weed eat.  Uses a power washer.  Says he tries to be active every day.  He says he does get winded with some heavy activity but he stops and rest and then returns back to his activity without difficulty.    Patient has a history of recurrent DVT and PE.  Is on chronic anticoagulation with Eliquis.  He denies  any known bleeding.  Says he is tolerating well.  Allergies  Allergen Reactions  . Mometasone Furoate Other (See Comments)    Nasonex: CAUSED RED,ITCHY EYES; "lost my eyelashes"   . Naproxen Sodium Nausea And Vomiting  . Nsaids Other (See Comments)    Stomach ulcers  . Anoro Ellipta [Umeclidinium-Vilanterol] Other (See Comments)    Double vision  . Aspirin Hives and Nausea And Vomiting     Chills, sweats  . Bevespi Aerosphere [Glycopyrrolate-Formoterol] Hives  . Pulmicort [Budesonide] Hives  . Breo Ellipta [Fluticasone Furoate-Vilanterol] Hives  . Dexilant [Dexlansoprazole] Diarrhea    Cramps   . Omnaris [Ciclesonide] Other (See Comments)    Nose bleed   . Spiriva Handihaler [Tiotropium Bromide Monohydrate] Other (See Comments)    Hiccups     Immunization History  Administered Date(s) Administered  . Influenza Split 02/26/2013, 02/21/2014, 02/04/2015  . Influenza Whole 03/31/2012  . Influenza, High Dose Seasonal PF 02/03/2018, 01/30/2019  . Influenza,inj,Quad PF,6+ Mos 02/04/2016  . Influenza-Unspecified 02/07/2017  . Pneumococcal Polysaccharide-23 08/30/2003, 07/30/2014    Past Medical History:  Diagnosis Date  . Allergic rhinitis   . Allergy   . Asthma   . Atrial fibrillation ([place])   . Bleeding gastric ulcer   . Blood transfusion without reported diagnosis   . CAD (coronary artery disease) 07/24/2017  . Cataract    bilateral removed  .  Chronic combined systolic and diastolic congestive heart failure ([place])   . Chronic renal insufficiency, stage III (moderate) ([facility])   . COPD (chronic obstructive pulmonary disease) ([place])   . Diabetes mellitus without complication ([place])    no meds- boarderline  . Diastolic heart failure ([place])   . Duodenum ulcer    with perforation  . DVT of axillary vein, acute left ([place]) 10/24/2014  . Emphysema   . GERD (gastroesophageal reflux disease)   . H/O hiatal hernia   . High cholesterol   . Hypertension   . Mixed hyperlipidemia   .  Myocardial infarction ([place])   . Neuromuscular disorder ([place])    reports shaking, somedays "can't hold spoon" - generalized  shakiness, is going to talk to MD about it at next appt.   . Normal cardiac stress test    reports stress test was several yrs. ago /w [employer]. Dr. Rogelia Mire, WNL   . OSA (obstructive sleep apnea) 2007   CPAP- report of settings on paper chart, seen by [facility] - seen 07/2011   . PE (pulmonary embolism) 10/2011  . Prostate cancer ([place]) 1992   in remission.  s/p XRT, melanoma under left eye, left temple  . Recurrent upper respiratory infection (URI)    coughing from chronic sinusitis   . Renal stone    some passed spontaneous, & also had cystoscopy  . Shortness of breath on exertion   . Sleep apnea    wears c-pap    Tobacco History: Social History   Tobacco Use  Smoking Status Former Smoker  . Packs/day: 1.00  . Years: 60.00  . Pack years: 60.00  . Types: Cigarettes  . Quit date: 08/06/2011  . Years since quitting: 7.5  Smokeless Tobacco Never Used   Counseling given: Not Answered   Outpatient Medications Prior to Visit  Medication Sig Dispense Refill  . albuterol (PROVENTIL) (2.5 MG/3ML) 0.083% nebulizer solution Take 2.5 mg by nebulization 4 (four) times daily.    [name] Kitchen atorvastatin (LIPITOR) 10 MG tablet Take 10 mg by mouth daily at 6 PM.     . diazepam (VALIUM) 2 MG tablet TK 1 T PO QD IN THE MORNING PRN    . ELIQUIS 5 MG TABS tablet TAKE 1 TABLET(5 MG) BY MOUTH TWICE DAILY 180 tablet 0  . esomeprazole (NEXIUM) 40 MG capsule Take 1 capsule (40 mg total) by mouth daily. 90 capsule 6  . ferrous sulfate 220 (44 Fe) MG/5ML solution Take 220 mg by mouth every other day.    . fluocinonide cream (LIDEX) [id number] % Apply 1 application topically daily as needed (rash). To infected area.    . fluticasone (FLONASE) 50 MCG/ACT nasal spray Place 2 sprays into the nose at bedtime. Each nostril once a day.    . furosemide (LASIX) 40 MG tablet TAKE 1 TABLET(40 MG) BY MOUTH  DAILY 90 tablet 3  . ipratropium (ATROVENT) 0.02 % nebulizer solution Take 2.5 mLs (0.5 mg total) by nebulization 4 (four) times daily. 120 mL 12  . ketoconazole (NIZORAL) 2 % cream Apply 1 fingertip amount to bottom of each foot and toenails daily 30 g 0  . metoprolol tartrate (LOPRESSOR) 25 MG tablet TAKE 1/2 TABLET(12.5 MG) BY MOUTH TWICE DAILY 90 tablet 3  . nitroGLYCERIN (NITROSTAT) 0.4 MG SL tablet Place 1 tablet (0.4 mg total) under the tongue every 5 (five) minutes as needed for chest pain. Please keep upcoming appt. thanks 75 tablet 0  . predniSONE (DELTASONE) 10 MG tablet TK  1 T PO BID UTD    . PROAIR HFA 108 (90 Base) MCG/ACT inhaler INHALE 2 PUFFS BY MOUTH EVERY 6 HOURS AS NEEDED FOR WHEEZING OR SHORTNESS OF BREATH 8.5 g 3  . SYMBICORT 160-4.5 MCG/ACT inhaler INHALE 2 PUFFS INTO THE LUNGS TWICE DAILY 10.2 g 0  . tamsulosin (FLOMAX) 0.4 MG CAPS capsule Take 0.4 mg by mouth daily.    [name] Kitchen tiZANidine (ZANAFLEX) 4 MG tablet TK 1 T PO HS PRN    . budesonide-formoterol (SYMBICORT) 160-4.5 MCG/ACT inhaler Inhale 2 puffs into the lungs 2 (two) times daily. (Patient not taking: Reported on 02/21/2019) 2 Inhaler 0  . budesonide-formoterol (SYMBICORT) 160-4.5 MCG/ACT inhaler Inhale 2 puffs into the lungs 2 (two) times daily. (Patient not taking: Reported on 02/21/2019) 2 Inhaler 0   No facility-administered medications prior to visit.      Review of Systems:   Constitutional:   No  weight loss, night sweats,  Fevers, chills, fatigue, or  lassitude.  HEENT:   No headaches,  Difficulty swallowing,  Tooth/dental problems, or  Sore throat,                No sneezing, itching, ear ache, nasal congestion, post nasal drip,   CV:  No chest pain,  Orthopnea, PND, swelling in lower extremities, anasarca, dizziness, palpitations, syncope.   GI  No heartburn, indigestion, abdominal pain, nausea, vomiting, diarrhea, change in bowel habits, loss of appetite, bloody stools.   Resp:  .  No wheezing.  No  chest wall deformity  Skin: no rash or lesions.  GU: no dysuria, change in color of urine, no urgency or frequency.  No flank pain, no hematuria   MS:  No joint pain or swelling.  No decreased range of motion.  No back pain.    Physical Exam  BP 126/72 (BP Location: Right Arm, Cuff Size: Normal)   Pulse 82   Temp (!) 97.2 F (36.2 C) (Temporal)   Ht 5\' 8"  (1.727 m)   Wt 168 lb (76.2 kg)   SpO2 96%   BMI 25.54 kg/m   GEN: A/Ox3; pleasant , NAD, elderly     HEENT:  [place]/AT,   , NOSE-clear, THROAT-clear, no lesions, no postnasal drip or exudate noted.   NECK:  Supple w/ fair ROM; no JVD; normal carotid impulses w/o bruits; no thyromegaly or nodules palpated; no lymphadenopathy.    RESP decrease sounds in the bases  no accessory muscle use, no dullness to percussion  CARD:  RRR, no m/r/g, no peripheral edema, pulses intact, no cyanosis or clubbing.  GI:   Soft & nt; nml bowel sounds; no organomegaly or masses detected.   Musco: Warm bil, no deformities or joint swelling noted.   Neuro: alert, no focal deficits noted.    Skin: Warm, no lesions or rashes    Lab Results:   BNP  Imaging: No results found.    No flowsheet data found.  No results found for: NITRICOXIDE      Assessment & Plan:   No problem-specific Assessment & Plan notes found for this encounter.     Rexene Edison, NP 02/21/2019

## 2019-02-22 NOTE — Assessment & Plan Note (Signed)
Compensated on present regimen  Plan  Patient Instructions  Continue on Symbicort 2 puffs twice daily, rinse after use Continue on Albuterol Ipratropium nebulizer 3 times daily Mucinex DM Twice daily  As needed  Cough/congestion  Continue on CPAP at bedtime.  Continue on Oxygen 2l/m with activity .  Chest xray today  Follow-up in 4 -6 months with Dr. Elsworth Soho and as needed

## 2019-02-22 NOTE — Assessment & Plan Note (Signed)
Continue on oxygen.  O2 saturation goal is greater than 88 to 90%. 

## 2019-02-22 NOTE — Assessment & Plan Note (Signed)
History of recurrent PE and DVT on lifelong anticoagulation.  Appears to be doing well on Eliquis.

## 2019-02-22 NOTE — Assessment & Plan Note (Signed)
Excellent control and compliance on CPAP  Plan  Patient Instructions  Continue on Symbicort 2 puffs twice daily, rinse after use Continue on Albuterol Ipratropium nebulizer 3 times daily Mucinex DM Twice daily  As needed  Cough/congestion  Continue on CPAP at bedtime.  Continue on Oxygen 2l/m with activity .  Chest xray today  Follow-up in 4 -6 months with Dr. Elsworth Soho and as needed

## 2019-03-13 ENCOUNTER — Telehealth: Payer: Self-pay | Admitting: Pulmonary Disease

## 2019-03-13 MED ORDER — PROAIR HFA 108 (90 BASE) MCG/ACT IN AERS: INHALATION_SPRAY | RESPIRATORY_TRACT | 6 refills | Status: AC

## 2019-03-13 NOTE — Telephone Encounter (Signed)
Call made to patient, confirmed DOB, medication, and pharmacy. Refill sent. Nothing further needed at this time.

## 2019-03-20 ENCOUNTER — Telehealth: Payer: Self-pay | Admitting: General Surgery

## 2019-03-20 MED ORDER — APIXABAN 5 MG PO TABS: 5.0000 mg | ORAL_TABLET | Freq: Two times a day (BID) | ORAL | 0 refills | Status: DC

## 2019-03-20 NOTE — Telephone Encounter (Signed)
OK to refill

## 2019-03-20 NOTE — Telephone Encounter (Signed)
Per DRP detailed message can be left on machine.  I left message stating 30 day refill was sent to  [facility] in [place].   Nothing further needed at this time.

## 2019-03-20 NOTE — Telephone Encounter (Signed)
Dr. Elsworth White a fax was received asking for a refill of the Eliquis 5 mg tablets BID issued last on 12/22/18. He was issued a 180 dispense. This patient was last seen on 02/21/19 by Rexene Edison, NP. He has an appointment to see you on 04/03/19.  Are you o.k. with refilling this medication for 30 days until he comes in to see you next month?

## 2019-03-26 ENCOUNTER — Encounter: Payer: Self-pay | Admitting: Podiatry

## 2019-03-26 ENCOUNTER — Other Ambulatory Visit: Payer: Self-pay

## 2019-03-26 ENCOUNTER — Ambulatory Visit (INDEPENDENT_AMBULATORY_CARE_PROVIDER_SITE_OTHER): Payer: Medicare Other | Admitting: Podiatry

## 2019-03-26 DIAGNOSIS — B353 Tinea pedis: Secondary | ICD-10-CM

## 2019-03-26 DIAGNOSIS — M79675 Pain in left toe(s): Secondary | ICD-10-CM | POA: Diagnosis not present

## 2019-03-26 DIAGNOSIS — M79674 Pain in right toe(s): Secondary | ICD-10-CM | POA: Diagnosis not present

## 2019-03-26 DIAGNOSIS — B351 Tinea unguium: Secondary | ICD-10-CM | POA: Diagnosis not present

## 2019-03-26 MED ORDER — KETOCONAZOLE 2 % EX CREA: TOPICAL_CREAM | CUTANEOUS | 1 refills | Status: AC

## 2019-03-26 NOTE — Patient Instructions (Addendum)
Athlete's Foot  Athlete's foot (tinea pedis) is a fungal infection of the skin on your feet. It often occurs on the skin that is between or underneath the toes. It can also occur on the soles of your feet. The infection can spread from person to person (is contagious). It can also spread when a person's bare feet come in contact with the fungus on shower floors or on items such as shoes. What are the causes? This condition is caused by a fungus that grows in warm, moist places. You can get athlete's foot by sharing shoes, shower stalls, towels, and wet floors with someone who is infected. Not washing your feet or changing your socks often enough can also lead to athlete's foot. What increases the risk? This condition is more likely to develop in:  Men.  People who have a weak body defense system (immune system).  People who have diabetes.  People who use public showers, such as at a gym.  People who wear heavy-duty shoes, such as industrial or military shoes.  Seasons with warm, humid weather. What are the signs or symptoms? Symptoms of this condition include:  Itchy areas between your toes or on the soles of your feet.  White, flaky, or scaly areas between your toes or on the soles of your feet.  Very itchy small blisters between your toes or on the soles of your feet.  Small cuts in your skin. These cuts can become infected.  Thick or discolored toenails. How is this diagnosed? This condition may be diagnosed with a physical exam and a review of your medical history. Your health care provider may also take a skin or toenail sample to examine under a microscope. How is this treated? This condition is treated with antifungal medicines. These may be applied as powders, ointments, or creams. In severe cases, an oral antifungal medicine may be given. Follow these instructions at home: Medicines  Apply or take over-the-counter and prescription medicines only as told by your health  care provider.  Apply your antifungal medicine as told by your health care provider. Do not stop using the antifungal even if your condition improves. Foot care  Do not scratch your feet.  Keep your feet dry: ? Wear cotton or wool socks. Change your socks every day or if they become wet. ? Wear shoes that allow air to flow, such as sandals or canvas tennis shoes.  Wash and dry your feet, including the area between your toes. Also, wash and dry your feet: ? Every day or as told by your health care provider. ? After exercising. General instructions  Do not let others use towels, shoes, nail clippers, or other personal items that touch your feet.  Protect your feet by wearing sandals in wet areas, such as locker rooms and shared showers.  Keep all follow-up visits as told by your health care provider. This is important.  If you have diabetes, keep your blood sugar under control. Contact a health care provider if:  You have a fever.  You have swelling, soreness, warmth, or redness in your foot.  Your feet are not getting better with treatment.  Your symptoms get worse.  You have new symptoms. Summary  Athlete's foot (tinea pedis) is a fungal infection of the skin on your feet. It often occurs on skin that is between or underneath the toes.  This condition is caused by a fungus that grows in warm, moist places.  Symptoms include white, flaky, or scaly areas between   your toes or on the soles of your feet.  This condition is treated with antifungal medicines.  Keep your feet clean. Always dry them thoroughly. This information is not intended to replace advice given to you by your health care provider. Make sure you discuss any questions you have with your health care provider. Document Released: 05/14/2000 Document Revised: 05/12/2017 Document Reviewed: 03/07/2017 Elsevier Patient Education  [place].  Diabetic Neuropathy Diabetic neuropathy refers to nerve damage  that is caused by diabetes (diabetes mellitus). Over time, people with diabetes can develop nerve damage throughout the body. There are several types of diabetic neuropathy:  Peripheral neuropathy. This is the most common type of diabetic neuropathy. It causes damage to nerves that carry signals between the spinal cord and other parts of the body (peripheral nerves). This usually affects nerves in the feet and legs first, and may eventually affect the hands and arms. The damage affects the ability to sense touch or temperature.  Autonomic neuropathy. This type causes damage to nerves that control involuntary functions (autonomic nerves). These nerves carry signals that control: ? Heartbeat. ? Body temperature. ? Blood pressure. ? Urination. ? Digestion. ? Sweating. ? Sexual function. ? Response to changing blood sugar (glucose) levels.  Focal neuropathy. This type of nerve damage affects one area of the body, such as an arm, a leg, or the face. The injury may involve one nerve or a small group of nerves. Focal neuropathy can be painful and unpredictable, and occurs most often in older adults with diabetes. This often develops suddenly, but usually improves over time and does not cause long-term problems.  Proximal neuropathy. This type of nerve damage affects the nerves of the thighs, hips, buttocks, or legs. It causes severe pain, weakness, and muscle death (atrophy), usually in the thigh muscles. It is more common among older men and people who have type 2 diabetes. The length of recovery time may vary. What are the causes? Peripheral, autonomic, and focal neuropathies are caused by diabetes that is not well controlled with treatment. The cause of proximal neuropathy is not known, but it may be caused by inflammation related to uncontrolled blood glucose levels. What are the signs or symptoms? Peripheral neuropathy Peripheral neuropathy develops slowly over time. When the nerves of the feet  and legs no longer work, you may experience:  Burning, stabbing, or aching pain in the legs or feet.  Pain or cramping in the legs or feet.  Loss of feeling (numbness) and inability to feel pressure or pain in the feet. This can lead to: ? Thick calluses or sores on areas of constant pressure. ? Ulcers. ? Reduced ability to feel temperature changes.  Foot deformities.  Muscle weakness.  Loss of balance or coordination. Autonomic neuropathy The symptoms of autonomic neuropathy vary depending on which nerves are affected. Symptoms may include:  Problems with digestion, such as: ? Nausea or vomiting. ? Poor appetite. ? Bloating. ? Diarrhea or constipation. ? Trouble swallowing. ? Losing weight without trying to.  Problems with the heart, blood and lungs, such as: ? Dizziness, especially when standing up. ? Fainting. ? Shortness of breath. ? Irregular heartbeat.  Bladder problems, such as: ? Trouble starting or stopping urination. ? Leaking urine. ? Trouble emptying the bladder. ? Urinary tract infections (UTIs).  Problems with other body functions, such as: ? Sweat. You may sweat too much or too little. ? Temperature. You might get hot easily. Or, you might feel cold more than usual. ?  Sexual function. Men may not be able to get or maintain an erection. Women may have vaginal dryness and difficulty with arousal. Focal neuropathy Symptoms affect only one area of the body. Common symptoms include:  Numbness.  Tingling.  Burning pain.  Prickling feeling.  Very sensitive skin.  Weakness.  Inability to move (paralysis).  Muscle twitching.  Muscles getting smaller (wasting).  Poor coordination.  Double or blurred vision. Proximal neuropathy  Sudden, severe pain in the hip, thigh, or buttocks. Pain may spread from the back into the legs (sciatica).  Pain and numbness in the arms and legs.  Tingling.  Loss of bladder control or bowel control.   Weakness and wasting of thigh muscles.  Difficulty getting up from a seated position.  Abdominal swelling.  Unexplained weight loss. How is this diagnosed? Diagnosis usually involves reviewing your medical history and any symptoms you have. Diagnosis varies depending on the type of neuropathy your health care provider suspects. Peripheral neuropathy Your health care provider will check areas that are affected by your nervous system (neurologic exam), such as your reflexes, how you move, and what you can feel. You may have other tests, such as:  Blood tests.  Removal and examination of fluid that surrounds the spinal cord (lumbar puncture).  CT scan.  MRI.  A test to check the nerves that control muscles (electromyogram, EMG).  Tests of how quickly messages pass through your nerves (nerve conduction velocity tests).  Removal of a small piece of nerve to be examined under a microscope (biopsy). Autonomic neuropathy You may have tests, such as:  Tests to measure your blood pressure and heart rate. This may include monitoring you while you are safely secured to an exam table that moves you from a lying position to an upright position (table tilt test).  Breathing tests to check your lungs.  Tests to check how food moves through the digestive system (gastric emptying tests).  Blood, sweat, or urine tests.  Ultrasound of your bladder.  Spinal fluid tests. Focal neuropathy This condition may be diagnosed with:  A neurologic exam.  CT scan.  MRI.  EMG.  Nerve conduction velocity tests. Proximal neuropathy There is no test to diagnose this type of neuropathy. You may have tests to rule out other possible causes of this type of neuropathy. Tests may include:  X-rays of your spine and lumbar region.  Lumbar puncture.  MRI. How is this treated? The goal of treatment is to keep nerve damage from getting worse. The most important part of treatment is keeping your blood  glucose level and your A1C level within your target range by following your diabetes management plan. Over time, maintaining lower blood glucose levels helps lessen symptoms. In some cases, you may need prescription pain medicine. Follow these instructions at home:  Lifestyle   Do not use any products that contain nicotine or tobacco, such as cigarettes and e-cigarettes. If you need help quitting, ask your health care provider.  Be physically active every day. Include strength training and balance exercises.  Follow a healthy meal plan.  Work with your health care provider to manage your blood pressure. General instructions  Follow your diabetes management plan as directed. ? Check your blood glucose levels as directed by your health care provider. ? Keep your blood glucose in your target range as directed by your health care provider. ? Have your A1C level checked at least two times a year, or as often as told by your health care provider.  Take over the counter and prescription medicines only as told by your health care provider. This includes insulin and diabetes medicine.  Do not drive or use heavy machinery while taking prescription pain medicines.  Check your skin and feet every day for cuts, bruises, redness, blisters, or sores.  Keep all follow up visits as told by your health care provider. This is important. Contact a health care provider if:  You have burning, stabbing, or aching pain in your legs or feet.  You are unable to feel pressure or pain in your feet.  You develop problems with digestion, such as: ? Nausea. ? Vomiting. ? Bloating. ? Constipation. ? Diarrhea. ? Abdominal pain.  You have difficulty with urination, such as inability: ? To control when you urinate (incontinence). ? To completely empty the bladder (retention).  You have palpitations.  You feel dizzy, weak, or faint when you stand up. Get help right away if:  You cannot urinate.  You  have sudden weakness or loss of coordination.  You have trouble speaking.  You have pain or pressure in your chest.  You have an irregular heart beat.  You have sudden inability to move a part of your body. Summary  Diabetic neuropathy refers to nerve damage that is caused by diabetes. It can affect nerves throughout the entire body, causing numbness and pain in the arms, legs, digestive tract, heart, and other body systems.  Keep your blood glucose level and your blood pressure in your target range, as directed by your health care provider. This can help prevent neuropathy from getting worse.  Check your skin and feet every day for cuts, bruises, redness, blisters, or sores.  Do not use any products that contain nicotine or tobacco, such as cigarettes and e-cigarettes. If you need help quitting, ask your health care provider. This information is not intended to replace advice given to you by your health care provider. Make sure you discuss any questions you have with your health care provider. Document Released: 07/26/2001 Document Revised: 06/29/2017 Document Reviewed: 06/21/2016 Elsevier Patient Education  2020 [employer].

## 2019-03-29 NOTE — Progress Notes (Signed)
Subjective: Jon White is seen today for follow up painful, elongated, thickened toenails 1-5 b/l feet that he cannot cut. Pain interferes with daily activities. Aggravating factor includes wearing enclosed shoe gear and relieved with periodic debridement.  Patient states he did not receive refill on his Ketoconazole on last visit. He says he mentioned it to my assistant, but not to me on last visit.  Current Outpatient Medications on File Prior to Visit  Medication Sig  . albuterol (PROVENTIL) (2.5 MG/3ML) 0.083% nebulizer solution Take 2.5 mg by nebulization 4 (four) times daily.  [name] Kitchen apixaban (ELIQUIS) 5 MG TABS tablet Take 1 tablet (5 mg total) by mouth 2 (two) times daily.  [name] Kitchen atorvastatin (LIPITOR) 10 MG tablet Take 10 mg by mouth daily at 6 PM.   . budesonide-formoterol (SYMBICORT) 160-4.5 MCG/ACT inhaler Inhale 2 puffs into the lungs 2 (two) times daily.  . budesonide-formoterol (SYMBICORT) 160-4.5 MCG/ACT inhaler Inhale 2 puffs into the lungs 2 (two) times daily.  . diazepam (VALIUM) 2 MG tablet TK 1 T PO QD IN THE MORNING PRN  . esomeprazole (NEXIUM) 40 MG capsule Take 1 capsule (40 mg total) by mouth daily.  . ferrous sulfate 220 (44 Fe) MG/5ML solution Take 220 mg by mouth every other day.  . fluocinonide cream (LIDEX) [id number] % Apply 1 application topically daily as needed (rash). To infected area.  . fluticasone (FLONASE) 50 MCG/ACT nasal spray Place 2 sprays into the nose at bedtime. Each nostril once a day.  . furosemide (LASIX) 40 MG tablet TAKE 1 TABLET(40 MG) BY MOUTH DAILY  . HYDROcodone-acetaminophen (NORCO/VICODIN) 5-325 MG tablet hydrocodone 5 mg-acetaminophen 325 mg tablet  . ipratropium (ATROVENT) 0.02 % nebulizer solution Take 2.5 mLs (0.5 mg total) by nebulization 4 (four) times daily.  [name] Kitchen LORazepam (ATIVAN) 1 MG tablet lorazepam 1 mg tablet  . metoprolol tartrate (LOPRESSOR) 25 MG tablet TAKE 1/2 TABLET(12.5 MG) BY MOUTH TWICE DAILY  . nitroGLYCERIN (NITROSTAT) 0.4 MG  SL tablet Place 1 tablet (0.4 mg total) under the tongue every 5 (five) minutes as needed for chest pain. Please keep upcoming appt. thanks  . predniSONE (DELTASONE) 10 MG tablet TK 1 T PO BID UTD  . PROAIR HFA 108 (90 Base) MCG/ACT inhaler INHALE 2 PUFFS BY MOUTH EVERY 6 HOURS AS NEEDED FOR WHEEZING OR SHORTNESS OF BREATH  . tamsulosin (FLOMAX) 0.4 MG CAPS capsule Take 0.4 mg by mouth daily.  [name] Kitchen tiZANidine (ZANAFLEX) 4 MG tablet TK 1 T PO HS PRN   No current facility-administered medications on file prior to visit.      Allergies  Allergen Reactions  . Mometasone Furoate Other (See Comments)    Nasonex: CAUSED RED,ITCHY EYES; "lost my eyelashes"   . Naproxen Sodium Nausea And Vomiting  . Nsaids Other (See Comments)    Stomach ulcers  . Anoro Ellipta [Umeclidinium-Vilanterol] Other (See Comments)    Double vision  . Aspirin Hives and Nausea And Vomiting     Chills, sweats  . Bevespi Aerosphere [Glycopyrrolate-Formoterol] Hives  . Pulmicort [Budesonide] Hives  . Breo Ellipta [Fluticasone Furoate-Vilanterol] Hives  . Dexilant [Dexlansoprazole] Diarrhea    Cramps   . Omnaris [Ciclesonide] Other (See Comments)    Nose bleed   . Spiriva Handihaler [Tiotropium Bromide Monohydrate] Other (See Comments)    Hiccups     Objective:  Vascular Examination: Capillary refill time <3 seconds x 10 digits.  Dorsalis pedis faintly palpable b/l.  Posterior tibial pulses absent b/l.  Digital hair absent b/l.  Skin  temperature gradient warm to cool b/l.  Dermatological Examination: Skin with normal turgor, texture and tone b/l.  Toenails 1-5 b/l discolored, thick, dystrophic with subungual debris and pain with palpation to nailbeds due to thickness of nails.  Diffuse scaling noted peripherally and plantarly b/l feet with mild foot odor.  No interdigital macerations.  No blisters, no weeping. No signs of secondary bacterial infection noted.  Musculoskeletal: Muscle strength 5/5 to all LE  muscle groups  No gross bony deformities b/l.  No pain, crepitus or joint limitation noted with ROM.   Neurological Examination: Protective sensation intact 5/5 with 10 gram monofilament bilaterally.  Epicritic sensation present bilaterally.  Vibratory sensation intact bilaterally.   Assessment: Painful onychomycosis toenails 1-5 b/l  Tinea pedis b/l  Plan: 1. Toenails 1-5 b/l were debrided in length and girth without iatrogenic bleeding.  2. For tinea pedis, prescription sent to pharmacy for Ketoconazole Cream 2% to be applied to both feet and between toes qd x 6 weeks. 3. Patient to continue soft, supportive shoe gear. 4. Patient to report any pedal injuries to medical professional immediately. 5. Follow up 3 months.  6. Patient/POA to call should there be a concern in the interim.

## 2019-04-03 ENCOUNTER — Other Ambulatory Visit: Payer: Self-pay

## 2019-04-03 ENCOUNTER — Encounter: Payer: Self-pay | Admitting: Pulmonary Disease

## 2019-04-03 ENCOUNTER — Ambulatory Visit (INDEPENDENT_AMBULATORY_CARE_PROVIDER_SITE_OTHER): Payer: Medicare Other | Admitting: Pulmonary Disease

## 2019-04-03 DIAGNOSIS — I2510 Atherosclerotic heart disease of native coronary artery without angina pectoris: Secondary | ICD-10-CM | POA: Diagnosis not present

## 2019-04-03 DIAGNOSIS — J449 Chronic obstructive pulmonary disease, unspecified: Secondary | ICD-10-CM

## 2019-04-03 DIAGNOSIS — J9611 Chronic respiratory failure with hypoxia: Secondary | ICD-10-CM | POA: Diagnosis not present

## 2019-04-03 DIAGNOSIS — I5042 Chronic combined systolic (congestive) and diastolic (congestive) heart failure: Secondary | ICD-10-CM | POA: Diagnosis not present

## 2019-04-03 DIAGNOSIS — G4733 Obstructive sleep apnea (adult) (pediatric): Secondary | ICD-10-CM

## 2019-04-03 MED ORDER — BUDESONIDE-FORMOTEROL FUMARATE 160-4.5 MCG/ACT IN AERO: 2.0000 | INHALATION_SPRAY | Freq: Two times a day (BID) | RESPIRATORY_TRACT | 0 refills | Status: DC

## 2019-04-03 NOTE — Assessment & Plan Note (Signed)
Very compliant with CPAP 12 cm and this is really helping his daytime somnolence and fatigue

## 2019-04-03 NOTE — Assessment & Plan Note (Signed)
Pedal edema appears increased although is not overly symptomatic. We will ask him to take an extra half tablet-20 mg of Lasix for the next few days until edema improves.  He also has chronic venous insufficiency

## 2019-04-03 NOTE — Assessment & Plan Note (Signed)
Continue O2 during sleep and as needed in the daytime

## 2019-04-03 NOTE — Assessment & Plan Note (Signed)
Continue Symbicort -he has not tolerated Spiriva or Anoro in the past Albuterol nebs as needed

## 2019-04-03 NOTE — Progress Notes (Signed)
Subjective:    Patient ID: Jon White, male    DOB: [birth year], 85 y.o.   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 Follow-up    He reports his cpap is working well. Download provided. Using his albuterol neb 3x/day, symbicort daily, and albuterol hfa prn.    Breathing is at baseline, however he has developed pedal edema. He denies orthopnea or paroxysmal nocturnal dyspnea or chest pain. He takes 40 mg Lasix around 9 AM and he does pee a lot. He is very compliant with CPAP, this is verified on download which shows excellent compliance on 12 cm with no residual events and minimal leak.  Denies cough or wheezing or sputum production.  He is compliant with Symbicort -he has settled on this MDI -we have tried several others in the past and he had reactions to all the others  Oxygen saturation is 97% today on room air, he is compliant with nocturnal O2 blended into CPAP  He had lesions excised from his head and his face by dermatologist  Significant tests/ events reviewed  PFTs 2014 FEV1 42%-1.08,significant bronchodilator response with improvement to 59%  NPSG 2008: AHI 11/hr, but no supine sleep.  Titration study: cpap 8cm.  Auto 06/2011: Optimal pressure 12cm.  Past Medical History:  Diagnosis Date  . Allergic rhinitis   . Allergy   . Asthma   . Atrial fibrillation ([place])   . Bleeding gastric ulcer   . Blood transfusion without reported diagnosis   . CAD (coronary artery disease) 07/24/2017  . Cataract    bilateral removed  . Chronic combined systolic and diastolic congestive heart failure ([place])   . Chronic renal insufficiency,  stage III (moderate)   . COPD (chronic obstructive pulmonary disease) ([place])   . Diabetes mellitus without complication ([place])    no meds- boarderline  . Diastolic heart failure ([place])   . Duodenum ulcer    with perforation  . DVT of axillary vein, acute left ([place]) 10/24/2014  . Emphysema   . GERD (gastroesophageal reflux disease)   . H/O hiatal hernia   . High cholesterol   . Hypertension   . Mixed hyperlipidemia   . Myocardial infarction ([place])   . Neuromuscular disorder ([place])    reports shaking, somedays "can't hold spoon" - generalized  shakiness, is going to talk to MD about it at next appt.   . Normal cardiac stress test    reports stress test was several yrs. ago /w [employer]. Dr. Rogelia Mire, WNL   . OSA (obstructive sleep apnea) 2007   CPAP- report of settings on paper chart, seen by [facility] - seen 07/2011   . PE (pulmonary embolism) 10/2011  . Prostate cancer ([place]) 1992   in remission.  s/p XRT, melanoma under left eye, left temple  . Recurrent upper respiratory infection (URI)    coughing from chronic sinusitis   . Renal stone    some passed spontaneous, & also had cystoscopy  . Shortness of breath on exertion   . Sleep apnea    wears c-pap     Review of Systems neg for any significant sore throat, dysphagia, itching, sneezing, nasal congestion or  excess/ purulent secretions, fever, chills, sweats, unintended wt loss, pleuritic or exertional cp, hempoptysis, orthopnea pnd or change in chronic leg swelling. Also denies presyncope, palpitations, heartburn, abdominal pain, nausea, vomiting, diarrhea or change in bowel or urinary habits, dysuria,hematuria, rash, arthralgias, visual complaints, headache, numbness weakness or ataxia.     Objective:   Physical Exam  Gen. Pleasant, well-nourished, in no distress, normal affect ENT - no pallor,icterus, no post nasal drip Neck: No JVD, no thyromegaly, no carotid bruits Lungs: no use of accessory muscles, no dullness to  percussion, clear without rales or rhonchi  Cardiovascular: Rhythm regular, heart sounds  normal, no murmurs or gallops, 2+ peripheral edema Abdomen: soft and non-tender, no hepatosplenomegaly, BS normal. Musculoskeletal: No deformities, no cyanosis or clubbing Neuro:  alert, non focal       Assessment & Plan:

## 2019-04-03 NOTE — Patient Instructions (Signed)
Take an extra half tablet of Lasix around 5 PM for the next 3 days -this should decrease your leg swelling  Stay on Symbicort. CPAP is working well

## 2019-04-09 ENCOUNTER — Telehealth: Payer: Self-pay | Admitting: Pulmonary Disease

## 2019-04-09 MED ORDER — IPRATROPIUM BROMIDE 0.02 % IN SOLN: 0.5000 mg | Freq: Four times a day (QID) | RESPIRATORY_TRACT | 2 refills | Status: DC

## 2019-04-09 NOTE — Telephone Encounter (Signed)
LM informing patient his refill has been sent in per [facility].   Nothing further needed at this time.

## 2019-04-12 DIAGNOSIS — E119 Type 2 diabetes mellitus without complications: Secondary | ICD-10-CM | POA: Diagnosis not present

## 2019-04-12 DIAGNOSIS — I130 Hypertensive heart and chronic kidney disease with heart failure and stage 1 through stage 4 chronic kidney disease, or unspecified chronic kidney disease: Secondary | ICD-10-CM | POA: Diagnosis not present

## 2019-04-12 DIAGNOSIS — Z7189 Other specified counseling: Secondary | ICD-10-CM | POA: Diagnosis not present

## 2019-04-12 DIAGNOSIS — I5042 Chronic combined systolic (congestive) and diastolic (congestive) heart failure: Secondary | ICD-10-CM | POA: Diagnosis not present

## 2019-04-12 DIAGNOSIS — I2510 Atherosclerotic heart disease of native coronary artery without angina pectoris: Secondary | ICD-10-CM | POA: Diagnosis not present

## 2019-04-16 ENCOUNTER — Other Ambulatory Visit: Payer: Self-pay | Admitting: Pulmonary Disease

## 2019-04-17 ENCOUNTER — Other Ambulatory Visit: Payer: Self-pay | Admitting: General Surgery

## 2019-04-18 DIAGNOSIS — J330 Polyp of nasal cavity: Secondary | ICD-10-CM | POA: Diagnosis not present

## 2019-04-18 DIAGNOSIS — J310 Chronic rhinitis: Secondary | ICD-10-CM | POA: Diagnosis not present

## 2019-04-18 DIAGNOSIS — H6123 Impacted cerumen, bilateral: Secondary | ICD-10-CM | POA: Diagnosis not present

## 2019-04-19 DIAGNOSIS — I5042 Chronic combined systolic (congestive) and diastolic (congestive) heart failure: Secondary | ICD-10-CM | POA: Diagnosis not present

## 2019-04-19 DIAGNOSIS — E782 Mixed hyperlipidemia: Secondary | ICD-10-CM | POA: Diagnosis not present

## 2019-04-19 DIAGNOSIS — N1831 Chronic kidney disease, stage 3a: Secondary | ICD-10-CM | POA: Diagnosis not present

## 2019-04-19 DIAGNOSIS — I10 Essential (primary) hypertension: Secondary | ICD-10-CM | POA: Diagnosis not present

## 2019-04-19 DIAGNOSIS — J449 Chronic obstructive pulmonary disease, unspecified: Secondary | ICD-10-CM | POA: Diagnosis not present

## 2019-04-30 DIAGNOSIS — Q6211 Congenital occlusion of ureteropelvic junction: Secondary | ICD-10-CM | POA: Diagnosis not present

## 2019-04-30 DIAGNOSIS — R350 Frequency of micturition: Secondary | ICD-10-CM | POA: Diagnosis not present

## 2019-04-30 DIAGNOSIS — C61 Malignant neoplasm of prostate: Secondary | ICD-10-CM | POA: Diagnosis not present

## 2019-07-02 ENCOUNTER — Ambulatory Visit (INDEPENDENT_AMBULATORY_CARE_PROVIDER_SITE_OTHER): Payer: Medicare Other | Admitting: Podiatry

## 2019-07-02 ENCOUNTER — Encounter: Payer: Self-pay | Admitting: Podiatry

## 2019-07-02 ENCOUNTER — Other Ambulatory Visit: Payer: Self-pay

## 2019-07-02 DIAGNOSIS — R600 Localized edema: Secondary | ICD-10-CM | POA: Diagnosis not present

## 2019-07-02 DIAGNOSIS — B351 Tinea unguium: Secondary | ICD-10-CM

## 2019-07-02 DIAGNOSIS — M79675 Pain in left toe(s): Secondary | ICD-10-CM | POA: Diagnosis not present

## 2019-07-02 DIAGNOSIS — E1151 Type 2 diabetes mellitus with diabetic peripheral angiopathy without gangrene: Secondary | ICD-10-CM | POA: Diagnosis not present

## 2019-07-02 DIAGNOSIS — M79674 Pain in right toe(s): Secondary | ICD-10-CM

## 2019-07-02 DIAGNOSIS — E119 Type 2 diabetes mellitus without complications: Secondary | ICD-10-CM | POA: Diagnosis not present

## 2019-07-02 NOTE — Progress Notes (Signed)
Subjective: Jon White presents today for follow up of preventative diabetic foot care and painful mycotic nails b/l that are difficult to trim. Pain interferes with ambulation. Aggravating factors include wearing enclosed shoe gear. Pain is relieved with periodic professional debridement.   Allergies  Allergen Reactions  . Mometasone Furoate Other (See Comments)    Nasonex: CAUSED RED,ITCHY EYES; "lost my eyelashes"   . Naproxen Sodium Nausea And Vomiting  . Nsaids Other (See Comments)    Stomach ulcers  . Anoro Ellipta [Umeclidinium-Vilanterol] Other (See Comments)    Double vision  . Aspirin Hives and Nausea And Vomiting     Chills, sweats  . Bevespi Aerosphere [Glycopyrrolate-Formoterol] Hives  . Pulmicort [Budesonide] Hives  . Breo Ellipta [Fluticasone Furoate-Vilanterol] Hives  . Dexilant [Dexlansoprazole] Diarrhea    Cramps   . Omnaris [Ciclesonide] Other (See Comments)    Nose bleed   . Spiriva Handihaler [Tiotropium Bromide Monohydrate] Other (See Comments)    Hiccups      Objective: There were no vitals filed for this visit.  Vascular Examination:  Capillary fill time to digits <3s b/l, faintly palpable DP pulses b/l, non-palpable PT pulses b/l, pedal hair absent b/l, skin temperature gradient warm to cool b/l and +1 pitting edema b/l LE  Dermatological Examination: Pedal skin with normal turgor, texture and tone bilaterally, no open wounds bilaterally, no interdigital macerations bilaterally and toenails 1-5 b/l elongated, dystrophic, thickened, crumbly with subungual debris  Musculoskeletal: Normal muscle strength 5/5 to all lower extremity muscle groups bilaterally, no gross bony deformities bilaterally and no pain crepitus or joint limitation noted with ROM b/l  Neurological: Protective sensation intact 5/5 intact bilaterally with 10g monofilament b/l and vibratory sensation intact b/l  Assessment: 1. Pain due to onychomycosis of toenails of both feet    2. Localized edema   3. Diabetes mellitus type 2 with peripheral artery disease ([place])   4. Encounter for diabetic foot exam ([place])     Plan: -Diabetic foot exam performed today. -Continue diabetic foot care principles. Literature dispensed on today.  -Toenails 1-5 b/l were debrided in length and girth without iatrogenic bleeding. -Patient to continue soft, supportive shoe gear daily. -Patient to report any pedal injuries to medical professional immediately. -Patient/POA to call should there be question/concern in the interim.  Return in about 3 months (around 09/29/2019) for diabetic nail trim.

## 2019-07-02 NOTE — Patient Instructions (Signed)
Diabetes Mellitus and Foot Care Foot care is an important part of your health, especially when you have diabetes. Diabetes may cause you to have problems because of poor blood flow (circulation) to your feet and legs, which can cause your skin to:  Become thinner and drier.  Break more easily.  Heal more slowly.  Peel and crack. You may also have nerve damage (neuropathy) in your legs and feet, causing decreased feeling in them. This means that you may not notice minor injuries to your feet that could lead to more serious problems. Noticing and addressing any potential problems early is the best way to prevent future foot problems. How to care for your feet Foot hygiene  Wash your feet daily with warm water and mild soap. Do not use hot water. Then, pat your feet and the areas between your toes until they are completely dry. Do not soak your feet as this can dry your skin.  Trim your toenails straight across. Do not dig under them or around the cuticle. File the edges of your nails with an emery board or nail file.  Apply a moisturizing lotion or petroleum jelly to the skin on your feet and to dry, brittle toenails. Use lotion that does not contain alcohol and is unscented. Do not apply lotion between your toes. Shoes and socks  Wear clean socks or stockings every day. Make sure they are not too tight. Do not wear knee-high stockings since they may decrease blood flow to your legs.  Wear shoes that fit properly and have enough cushioning. Always look in your shoes before you put them on to be sure there are no objects inside.  To break in new shoes, wear them for just a few hours a day. This prevents injuries on your feet. Wounds, scrapes, corns, and calluses  Check your feet daily for blisters, cuts, bruises, sores, and redness. If you cannot see the bottom of your feet, use a mirror or ask someone for help.  Do not cut corns or calluses or try to remove them with medicine.  If you  find a minor scrape, cut, or break in the skin on your feet, keep it and the skin around it clean and dry. You may clean these areas with mild soap and water. Do not clean the area with peroxide, alcohol, or iodine.  If you have a wound, scrape, corn, or callus on your foot, look at it several times a day to make sure it is healing and not infected. Check for: ? Redness, swelling, or pain. ? Fluid or blood. ? Warmth. ? Pus or a bad smell. General instructions  Do not cross your legs. This may decrease blood flow to your feet.  Do not use heating pads or hot water bottles on your feet. They may burn your skin. If you have lost feeling in your feet or legs, you may not know this is happening until it is too late.  Protect your feet from hot and cold by wearing shoes, such as at the beach or on hot pavement.  Schedule a complete foot exam at least once a year (annually) or more often if you have foot problems. If you have foot problems, report any cuts, sores, or bruises to your health care provider immediately. Contact a health care provider if:  You have a medical condition that increases your risk of infection and you have any cuts, sores, or bruises on your feet.  You have an injury that is not   healing.  You have redness on your legs or feet.  You feel burning or tingling in your legs or feet.  You have pain or cramps in your legs and feet.  Your legs or feet are numb.  Your feet always feel cold.  You have pain around a toenail. Get help right away if:  You have a wound, scrape, corn, or callus on your foot and: ? You have pain, swelling, or redness that gets worse. ? You have fluid or blood coming from the wound, scrape, corn, or callus. ? Your wound, scrape, corn, or callus feels warm to the touch. ? You have pus or a bad smell coming from the wound, scrape, corn, or callus. ? You have a fever. ? You have a red line going up your leg. Summary  Check your feet every day  for cuts, sores, red spots, swelling, and blisters.  Moisturize feet and legs daily.  Wear shoes that fit properly and have enough cushioning.  If you have foot problems, report any cuts, sores, or bruises to your health care provider immediately.  Schedule a complete foot exam at least once a year (annually) or more often if you have foot problems. This information is not intended to replace advice given to you by your health care provider. Make sure you discuss any questions you have with your health care provider. Document Revised: 02/07/2019 Document Reviewed: 06/18/2016 Elsevier Patient Education  2020 Elsevier Inc.  

## 2019-07-12 DIAGNOSIS — I10 Essential (primary) hypertension: Secondary | ICD-10-CM | POA: Diagnosis not present

## 2019-07-12 DIAGNOSIS — J449 Chronic obstructive pulmonary disease, unspecified: Secondary | ICD-10-CM | POA: Diagnosis not present

## 2019-07-12 DIAGNOSIS — I5042 Chronic combined systolic (congestive) and diastolic (congestive) heart failure: Secondary | ICD-10-CM | POA: Diagnosis not present

## 2019-07-12 DIAGNOSIS — N1831 Chronic kidney disease, stage 3a: Secondary | ICD-10-CM | POA: Diagnosis not present

## 2019-08-03 ENCOUNTER — Telehealth: Payer: Self-pay | Admitting: Cardiovascular Disease

## 2019-08-03 NOTE — Telephone Encounter (Signed)
Spoke with patient. He feels an area on his thigh about 3/4 inch firm, tender, not discolored.  There is no swelling .  Since he's had a DVT/PE in the past he is worried this may be a blood clot.  He take Eliquis and has not missed any doses. He has no CP.  SOB as his usual with COPD.  He has been using his chainsaw and to start it he holds it between his legs.  That is how he noticed the area is tender.  Reviewed with Dr. Angelena Form who thinks this is likely a small hematoma under the skin and recommends to call back if any swelling develops below this site.  DVT would be unlikely while on eliquis.  Pt has been informed and will continue to monitor. Pt scheduled for his follow up here 08/15/19.

## 2019-08-03 NOTE — Telephone Encounter (Signed)
Patient is concerned that he may be developing a blood clot in his thigh. He feels like he has a knot in his leg, and it is tender. He wanted to know what to do to prevent the clot from forming. Please advise

## 2019-08-09 DIAGNOSIS — M7989 Other specified soft tissue disorders: Secondary | ICD-10-CM | POA: Diagnosis not present

## 2019-08-09 DIAGNOSIS — I10 Essential (primary) hypertension: Secondary | ICD-10-CM | POA: Diagnosis not present

## 2019-08-09 DIAGNOSIS — J449 Chronic obstructive pulmonary disease, unspecified: Secondary | ICD-10-CM | POA: Diagnosis not present

## 2019-08-09 DIAGNOSIS — I130 Hypertensive heart and chronic kidney disease with heart failure and stage 1 through stage 4 chronic kidney disease, or unspecified chronic kidney disease: Secondary | ICD-10-CM | POA: Diagnosis not present

## 2019-08-09 DIAGNOSIS — R600 Localized edema: Secondary | ICD-10-CM | POA: Diagnosis not present

## 2019-08-09 DIAGNOSIS — M79604 Pain in right leg: Secondary | ICD-10-CM | POA: Diagnosis not present

## 2019-08-09 DIAGNOSIS — E782 Mixed hyperlipidemia: Secondary | ICD-10-CM | POA: Diagnosis not present

## 2019-08-15 ENCOUNTER — Other Ambulatory Visit: Payer: Self-pay

## 2019-08-15 ENCOUNTER — Encounter: Payer: Self-pay | Admitting: Cardiovascular Disease

## 2019-08-15 ENCOUNTER — Ambulatory Visit (INDEPENDENT_AMBULATORY_CARE_PROVIDER_SITE_OTHER): Payer: Medicare Other | Admitting: Cardiovascular Disease

## 2019-08-15 VITALS — BP 130/72 | HR 70 | Ht 68.0 in | Wt 176.0 lb

## 2019-08-15 DIAGNOSIS — I5032 Chronic diastolic (congestive) heart failure: Secondary | ICD-10-CM | POA: Diagnosis not present

## 2019-08-15 DIAGNOSIS — E7800 Pure hypercholesterolemia, unspecified: Secondary | ICD-10-CM | POA: Diagnosis not present

## 2019-08-15 DIAGNOSIS — I10 Essential (primary) hypertension: Secondary | ICD-10-CM | POA: Diagnosis not present

## 2019-08-15 DIAGNOSIS — I2510 Atherosclerotic heart disease of native coronary artery without angina pectoris: Secondary | ICD-10-CM

## 2019-08-15 MED ORDER — ATORVASTATIN CALCIUM 10 MG PO TABS: 10.0000 mg | ORAL_TABLET | Freq: Every day | ORAL | 3 refills | Status: AC

## 2019-08-15 MED ORDER — NITROGLYCERIN 0.4 MG SL SUBL: 0.4000 mg | SUBLINGUAL_TABLET | SUBLINGUAL | 5 refills | Status: AC | PRN

## 2019-08-15 MED ORDER — METOPROLOL TARTRATE 25 MG PO TABS: 12.5000 mg | ORAL_TABLET | Freq: Two times a day (BID) | ORAL | 3 refills | Status: AC

## 2019-08-15 MED ORDER — FUROSEMIDE 40 MG PO TABS: 40.0000 mg | ORAL_TABLET | Freq: Every day | ORAL | 3 refills | Status: AC

## 2019-08-15 NOTE — Progress Notes (Signed)
Chief Complaint  Patient presents with  . Follow-up    PVCs   History of Present Illness: 86 yo male with history of PE/DVT,  PVCs, NSVT, GI bleeding, COPD, prostate cancer, OSA, HLD and chronic kidney disease who is here today for cardiac follow up. He was seen as a new patient 03/25/14 for cardiac evaluation after he arrived back in [place] from a trip to [place]. He was in [place] September 2015 and had a syncopal episode. He was taken to a [facility] where he was diagnosed with a non-STEMI. His coumadin was held and he was placed on IV heparin but he developed a GI bleed and no cath was performed. Upper endoscopy showed multiple bleeding gastric ulcers. He also had runs of non-sustained VT and was discharged on metoprolol. Echo 02/08/14 from [facility] with LVEF=40-45%. I arranged a cardiac cath after his first visit but this was cancelled pending GI workup. EGD 04/03/14 with active bleeding ulcer in the duodenal bulb. Otherwise EGD was normal. Severe sigmoid diverticulosis noted on colonoscopy.Follow up EGD May 2016 with healed ulcer. He was admitted to [facility] 10/24/14 with dyspnea and found to have recurrent PE. He was started on Eliquis. Echo 10/24/14 with normal LV function, aortic valve sclerosis. He was seen here 11/07/14 and was doing well. He was seen in the the Pulmonary office the following week and his HR was felt to be in the 40s but no EKG was done. I saw him 11/22/14 and EKG showed ventricular bigeminy with overall HR in the 80s. He was started on a beta blocker and he did well. Cardiac monitor 2017 with 72,000 PVCs over 48 hours. He was seen in the EP clinic by Dr. Lovena Le and medical therapy was continued. Echo 10/27/16 with LVEF=50%, no valve disease.  He is intolerant of ASA (hives).   He is here today for follow up. The patient denies any chest pain, palpitations, lower extremity edema, orthopnea, PND, dizziness, near syncope or syncope. Baseline dyspnea.   Primary Care Physician:  Merrilee Seashore, MD  Past Medical History:  Diagnosis Date  . Allergic rhinitis   . Allergy   . Asthma   . Atrial fibrillation ([place])   . Bleeding gastric ulcer   . Blood transfusion without reported diagnosis   . CAD (coronary artery disease) 07/24/2017  . Cataract    bilateral removed  . Chronic combined systolic and diastolic congestive heart failure ([place])   . Chronic renal insufficiency, stage III (moderate)   . COPD (chronic obstructive pulmonary disease) ([place])   . Diabetes mellitus without complication ([place])    no meds- boarderline  . Diastolic heart failure ([place])   . Duodenum ulcer    with perforation  . DVT of axillary vein, acute left ([place]) 10/24/2014  . Emphysema   . GERD (gastroesophageal reflux disease)   . H/O hiatal hernia   . High cholesterol   . Hypertension   . Mixed hyperlipidemia   . Myocardial infarction ([place])   . Neuromuscular disorder ([place])    reports shaking, somedays "can't hold spoon" - generalized  shakiness, is going to talk to MD about it at next appt.   . Normal cardiac stress test    reports stress test was several yrs. ago /w [employer]. Dr. Rogelia Mire, WNL   . OSA (obstructive sleep apnea) 2007   CPAP- report of settings on paper chart, seen by [facility] - seen 07/2011   . PE (pulmonary embolism) 10/2011  . Prostate  cancer ([place]) 1992   in remission.  s/p XRT, melanoma under left eye, left temple  . Recurrent upper respiratory infection (URI)    coughing from chronic sinusitis   . Renal stone    some passed spontaneous, & also had cystoscopy  . Shortness of breath on exertion   . Sleep apnea    wears c-pap    Past Surgical History:  Procedure Laterality Date  . CATARACT EXTRACTION W/ INTRAOCULAR LENS  IMPLANT, BILATERAL  ~ 2000  . COLONOSCOPY N/A 04/03/2014   Procedure: COLONOSCOPY;  Surgeon: Inda Castle, MD;  Location: [facility];  Service: Endoscopy;  Laterality: N/A;  . ESOPHAGOGASTRODUODENOSCOPY N/A 08/28/2013   Procedure:  ESOPHAGOGASTRODUODENOSCOPY (EGD);  Surgeon: Gayland Curry, MD;  Location: [place];  Service: General;  Laterality: N/A;  . ESOPHAGOGASTRODUODENOSCOPY N/A 04/03/2014   Procedure: ESOPHAGOGASTRODUODENOSCOPY (EGD);  Surgeon: Inda Castle, MD;  Location: [facility];  Service: Endoscopy;  Laterality: N/A;  . GASTROSTOMY N/A 08/28/2013   Procedure: GASTROSTOMY;  Surgeon: Rolm Bookbinder, MD;  Location: [place];  Service: General;  Laterality: N/A;  . LAPAROTOMY N/A 08/28/2013   Procedure: EXPLORATORY LAPAROTOMY;  Surgeon: Rolm Bookbinder, MD;  Location: [place];  Service: General;  Laterality: N/A;  . LOBECTOMY Right 1986   1/3 removed   . LUNG SURGERY  1986   presumed from RLL. Segmentectomy. Benign Nodule.   [name] Kitchen MAXILLARY ANTROSTOMY  09/15/2011   Procedure: MAXILLARY ANTROSTOMY;  Surgeon: Ascencion Dike, MD;  [facility]: [facility];  Service: ENT;  Laterality: Bilateral;  TOTAL MAXILLARY ANTROSTOMY  . MELANOMA EXCISION Left    under left eye  . REPAIR OF PERFORATED ULCER N/A 08/28/2013   Procedure: DUODENAL ULCER PATCH;  Surgeon: Rolm Bookbinder, MD;  Location: [place];  Service: General;  Laterality: N/A;  . SEPTOPLASTY  09/15/11  . SEPTOPLASTY  09/15/2011   Procedure: SEPTOPLASTY;  Surgeon: Ascencion Dike, MD;  Location: [place];  Service: ENT;  Laterality: Bilateral;  . SINUS ENDO W/FUSION  sch. 09/15/2011   Procedure: ENDOSCOPIC SINUS SURGERY WITH FUSION NAVIGATION;  Surgeon: Ascencion Dike, MD;  Location: [place];  Service: ENT;  Laterality: Bilateral;  bilateral maxillary antrostomy, bilateral endoscopic ethmoidectomy, bilateral sphenoidectomy, bilateral frontal recess exploration  . SINUS ENDO W/FUSION  09/15/2011   Procedure: ENDOSCOPIC SINUS SURGERY WITH FUSION NAVIGATION;  Surgeon: Ascencion Dike, MD;  Location: [place];  Service: ENT;  Laterality: Bilateral;  . SINUS EXPLORATION  09/15/2011   Procedure: SINUS EXPLORATION;  Surgeon: Ascencion Dike, MD;  Location: [place];  Service: ENT;  Laterality:  Bilateral;  FRONTAL RECESS EXPLORATION  . SPHENOIDECTOMY  09/15/2011   Procedure: Coralee Pesa;  Surgeon: Ascencion Dike, MD;  Location: [place];  Service: ENT;  Laterality: Bilateral;  . URETEROSCOPY  1980's    Current Outpatient Medications  Medication Sig Dispense Refill  . albuterol (PROVENTIL) (2.5 MG/3ML) 0.083% nebulizer solution Take 2.5 mg by nebulization 4 (four) times daily.    [name] Kitchen atorvastatin (LIPITOR) 10 MG tablet Take 1 tablet (10 mg total) by mouth daily at 6 PM. 90 tablet 3  . budesonide-formoterol (SYMBICORT) 160-4.5 MCG/ACT inhaler Inhale 2 puffs into the lungs 2 (two) times daily. 10.2 g 5  . ELIQUIS 5 MG TABS tablet TAKE 1 TABLET(5 MG) BY MOUTH TWICE DAILY 60 tablet 2  . esomeprazole (NEXIUM) 40 MG capsule Take 1 capsule (40 mg total) by mouth daily. 90 capsule 6  . ferrous sulfate 220 (44 Fe) MG/5ML solution Take  220 mg by mouth every other day.    . fluocinonide cream (LIDEX) [id number] % Apply 1 application topically daily as needed (rash). To infected area.    . fluticasone (FLONASE) 50 MCG/ACT nasal spray Place 2 sprays into the nose at bedtime. Each nostril once a day.    . furosemide (LASIX) 40 MG tablet Take 1 tablet (40 mg total) by mouth daily. 90 tablet 3  . HYDROcodone-acetaminophen (NORCO/VICODIN) 5-325 MG tablet hydrocodone 5 mg-acetaminophen 325 mg tablet    . ketoconazole (NIZORAL) 2 % cream Apply to both feet once daily for 6 weeks. 30 g 1  . LORazepam (ATIVAN) 1 MG tablet lorazepam 1 mg tablet    . metoprolol tartrate (LOPRESSOR) 25 MG tablet Take 0.5 tablets (12.5 mg total) by mouth 2 (two) times daily. 90 tablet 3  . nitroGLYCERIN (NITROSTAT) 0.4 MG SL tablet Place 1 tablet (0.4 mg total) under the tongue every 5 (five) minutes as needed for chest pain. 25 tablet 5  . PROAIR HFA 108 (90 Base) MCG/ACT inhaler INHALE 2 PUFFS BY MOUTH EVERY 6 HOURS AS NEEDED FOR WHEEZING OR SHORTNESS OF BREATH 8.5 g 6  . tamsulosin (FLOMAX) 0.4 MG CAPS capsule Take 0.4 mg by mouth  daily.    [name] Kitchen tiZANidine (ZANAFLEX) 4 MG tablet TK 1 T PO HS PRN    . ipratropium (ATROVENT) 0.02 % nebulizer solution Take 2.5 mLs (0.5 mg total) by nebulization 4 (four) times daily. 300 mL 2   No current facility-administered medications for this visit.    Allergies  Allergen Reactions  . Mometasone Furoate Other (See Comments)    Nasonex: CAUSED RED,ITCHY EYES; "lost my eyelashes"   . Naproxen Sodium Nausea And Vomiting  . Nsaids Other (See Comments)    Stomach ulcers  . Anoro Ellipta [Umeclidinium-Vilanterol] Other (See Comments)    Double vision  . Aspirin Hives and Nausea And Vomiting     Chills, sweats  . Bevespi Aerosphere [Glycopyrrolate-Formoterol] Hives  . Pulmicort [Budesonide] Hives  . Breo Ellipta [Fluticasone Furoate-Vilanterol] Hives  . Dexilant [Dexlansoprazole] Diarrhea    Cramps   . Omnaris [Ciclesonide] Other (See Comments)    Nose bleed   . Spiriva Handihaler [Tiotropium Bromide Monohydrate] Other (See Comments)    Hiccups     Social History   Socioeconomic History  . Marital status: Married    Spouse name: Not on file  . Number of children: 4  . Years of education: Not on file  . Highest education level: Not on file  Occupational History  . Occupation: 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   Tobacco Use  . Smoking status: Former Smoker    Packs/day: 1.00    Years: 60.00    Pack years: 60.00    Types: Cigarettes    Quit date: 08/06/2011    Years since quitting: 8.0  . Smokeless tobacco: Never Used  Substance and Sexual Activity  . Alcohol use: Yes    Alcohol/week: 1.0 standard drinks    Types: 1 Cans of beer per week  . Drug use: No  . Sexual activity: Never  Other Topics Concern  . Not on file  Social History Narrative   Traveled to [place].   Married and lives with wife.   Originally from [place].   Spends winters in [place]   Has 2 kids in [place]      Social Determinants of Health   Financial Resource Strain:   . Difficulty of Paying Living  Expenses:   Food Insecurity:   .  Worried About Charity fundraiser in the Last Year:   . Arboriculturist in the Last Year:   Transportation Needs:   . Film/video editor (Medical):   [name] Kitchen Lack of Transportation (Non-Medical):   Physical Activity:   . Days of Exercise per Week:   . Minutes of Exercise per Session:   Stress:   . Feeling of Stress :   Social Connections:   . Frequency of Communication with Friends and Family:   . Frequency of Social Gatherings with Friends and Family:   . Attends Religious Services:   . Active Member of Clubs or Organizations:   . Attends Archivist Meetings:   [name] Kitchen Marital Status:   Intimate Partner Violence:   . Fear of Current or Ex-Partner:   . Emotionally Abused:   [name] Kitchen Physically Abused:   . Sexually Abused:     Family History  Problem Relation Age of Onset  . Asthma Father   . Heart disease Mother        MI age 87  . Heart attack Mother   . Prostate cancer Brother   . Prostate cancer Brother   . Prostate cancer Brother   . Stroke Sister   . Anesthesia problems Neg Hx   . Colon cancer Neg Hx   . Esophageal cancer Neg Hx   . Rectal cancer Neg Hx   . Stomach cancer Neg Hx     Review of Systems:  As stated in the HPI and otherwise negative.   BP 130/72   Pulse 70   Ht 5\' 8"  (1.727 m)   Wt 176 lb (79.8 kg)   SpO2 95%   BMI 26.76 kg/m   Physical Examination: General: Well developed, well nourished, NAD  HEENT: OP clear, mucus membranes moist  SKIN: warm, dry. No rashes. Neuro: No focal deficits  Musculoskeletal: Muscle strength 5/5 all ext  Psychiatric: Mood and affect normal  Neck: No JVD, no carotid bruits, no thyromegaly, no lymphadenopathy.  Lungs:Clear bilaterally, no wheezes, rhonci, crackles Cardiovascular: Regular rate and rhythm. No murmurs, gallops or rubs. Abdomen:Soft. Bowel sounds present. Non-tender.  Extremities: 1+ bilateral lower extremity edema. Pulses are 2 + in the bilateral DP/PT.  Echo May  2018:  Left ventricle: Septal and inferior basal hypokinesis. The cavity   size was normal. Systolic function was mildly reduced. The   estimated ejection fraction was in the range of 45% to 50%. Wall   motion was normal; there were no regional wall motion   abnormalities. Doppler parameters are consistent with abnormal   left ventricular relaxation (grade 1 diastolic dysfunction). - Atrial septum: No defect or patent foramen ovale was identified.  EKG:  EKG is ordered today The ekg ordered today demonstrates sinus, PVCs  Recent Labs: No results found for requested labs within last 8760 hours.   Lipid Panel    Component Value Date/Time   TRIG 56 08/28/2013 1620     Wt Readings from Last 3 Encounters:  08/15/19 176 lb (79.8 kg)  04/03/19 165 lb 6.4 oz (75 kg)  02/21/19 168 lb (76.2 kg)     Other studies Reviewed: Additional studies/ records that were reviewed today include:  Review of the above records demonstrates:    Assessment and Plan:   1. CAD without angina: He has presumed CAD but has had no cath due to issues with prior GI bleeding while on heparin and then DVT/PE requiring Eliquis. He has no chest pain. Continue statin and beta  blocker.     2. HLD: Lipids well controlled in primary care. Continue statin.   3. DVT/PE: Followed in Pulmonary/primary care. Continue Eliquis  4. PVCs with bigeminy/trigeminy: No palpitations. Continue beta blocker.    5. Chronic diastolic CHF:  He has chronic LE edema. Weight is stable. Continue lasix.  He will use extra lasix as needed.   6. HTN: BP is controlled. No changes.   Current medicines are reviewed at length with the patient today.  The patient does not have concerns regarding medicines.  The following changes have been made:  no change  Labs/ tests ordered today include:   Orders Placed This Encounter  Procedures  . EKG 12-Lead    Disposition:   FU with me in 12 months  Signed, Lauree Chandler,  MD 08/15/2019 [facility] [place], [place], [place]  [address] Phone: [phone]; Fax: [phone]

## 2019-08-15 NOTE — Patient Instructions (Signed)
Medication Instructions:  No changes *If you need a refill on your cardiac medications before your next appointment, please call your pharmacy*   Lab Work: none If you have labs (blood work) drawn today and your tests are completely normal, you will receive your results only by: [name] Kitchen MyChart Message (if you have MyChart) OR . A paper copy in the mail If you have any lab test that is abnormal or we need to change your treatment, we will call you to review the results.   Testing/Procedures: none   Follow-Up: At [facility], you and your health needs are our priority.  As part of our continuing mission to provide you with exceptional heart care, we have created designated Provider Care Teams.  These Care Teams include your primary Cardiologist (physician) and Advanced Practice Providers (APPs -  Physician Assistants and Nurse Practitioners) who all work together to provide you with the care you need, when you need it.  We recommend signing up for the patient portal called "MyChart".  Sign up information is provided on this After Visit Summary.  MyChart is used to connect with patients for Virtual Visits (Telemedicine).  Patients are able to view lab/test results, encounter notes, upcoming appointments, etc.  Non-urgent messages can be sent to your provider as well.   To learn more about what you can do with MyChart, go to [email or web address].    Your next appointment:   12 month(s)  The format for your next appointment:   Either In Person or Virtual  Provider:   You may see Lauree Chandler, MD or one of the following Advanced Practice Providers on your designated Care Team:    Melina Copa, PA-C  Ermalinda Barrios, PA-C    Other Instructions

## 2019-08-22 ENCOUNTER — Other Ambulatory Visit: Payer: Self-pay | Admitting: Pulmonary Disease

## 2019-08-23 DIAGNOSIS — Z23 Encounter for immunization: Secondary | ICD-10-CM | POA: Diagnosis not present

## 2019-09-15 DIAGNOSIS — Z23 Encounter for immunization: Secondary | ICD-10-CM | POA: Diagnosis not present

## 2019-10-01 ENCOUNTER — Ambulatory Visit (INDEPENDENT_AMBULATORY_CARE_PROVIDER_SITE_OTHER): Payer: Medicare Other | Admitting: Adult Health

## 2019-10-01 ENCOUNTER — Other Ambulatory Visit: Payer: Self-pay

## 2019-10-01 ENCOUNTER — Encounter: Payer: Self-pay | Admitting: Adult Health

## 2019-10-01 DIAGNOSIS — J9611 Chronic respiratory failure with hypoxia: Secondary | ICD-10-CM

## 2019-10-01 DIAGNOSIS — I2510 Atherosclerotic heart disease of native coronary artery without angina pectoris: Secondary | ICD-10-CM

## 2019-10-01 DIAGNOSIS — G4733 Obstructive sleep apnea (adult) (pediatric): Secondary | ICD-10-CM

## 2019-10-01 DIAGNOSIS — J449 Chronic obstructive pulmonary disease, unspecified: Secondary | ICD-10-CM

## 2019-10-01 DIAGNOSIS — I5042 Chronic combined systolic (congestive) and diastolic (congestive) heart failure: Secondary | ICD-10-CM | POA: Diagnosis not present

## 2019-10-01 NOTE — Patient Instructions (Addendum)
Continue on Symbicort 2 puffs twice daily, rinse after use Continue on  Ipratropium nebulizer 3 times daily Mucinex DM Twice daily  As needed  Cough/congestion  Continue on CPAP at bedtime.  Continue on Oxygen 2l/m with activity .  Activity as tolerated.  Follow-up in 4 -6 months with Dr. Elsworth Soho and as needed

## 2019-10-01 NOTE — Progress Notes (Signed)
@Patient  ID: Jon White, male    DOB: 09-25-1932, 86 y.o.   MRN: [id number]  Chief Complaint  Patient presents with  . 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  Coronary artery disease, congestive heart failure, chronic kidney disease.  Nasal polyps s/p surgery (Dr. Benjamine Mola)   TEST/EVENTS :  PFTs 2014 FEV1 42%-1.08,significant bronchodilator response with improvement to 59%  NPSG 2008: AHI 11/hr, but no supine sleep.  Titration study: cpap 8cm.  Auto 06/2011: Optimal pressure 12cm.  Echo 2018 EF 45-50%, Gr 1 DD   10/01/2019 Follow up : COPD/Asthma  , O2 RF , OSA  Patient presents for a 79-month follow-up.  Patient has underlying COPD w/ asthma component .  Overall breathing is doing okay.  He remains on Symbicort twice daily.  He is on Atrovent nebulizers 3  times daily.  He denies any flare of cough or wheezing.  Says activity tolerance is at baseline. Tries to work out in the yard, and keeps active. Mows grass, can weed eating and use chain saw.  He remains on oxygen 2 L with activity.   He has obstructive sleep apnea is on nocturnal CPAP.  Says he is doing well on CPAP.  Says he wears his CPAP each night.  Gets in about 6 to 7 hours of sleep.  Feels that he benefits from CPAP with decreased daytime sleepiness.  CPAP download shows excellent control with 100% usage, daily avg usage at 9.5hr. AHI 2.1. On CPAP at [facility].   Patient has history of recurrent DVT and PE.  He is on lifelong anticoagulation with Eliquis.  He denies any known bleeding. Bruises easily.   Has CHF , on lasix and metoprolol . Leg swelling at baseline, wears TEDS . Says the support hose do help (just had to  put on) .    He has received both Covid vaccines.      Allergies  Allergen Reactions  . Mometasone Furoate Other (See Comments)    Nasonex: CAUSED RED,ITCHY EYES; "lost my eyelashes"   . Naproxen Sodium Nausea And Vomiting  . Nsaids Other (See Comments)    Stomach ulcers  . Anoro Ellipta [Umeclidinium-Vilanterol] Other (See Comments)    Double vision  . Aspirin Hives and Nausea And Vomiting     Chills, sweats  . Bevespi Aerosphere [Glycopyrrolate-Formoterol] Hives  . Pulmicort [Budesonide] Hives  . Breo Ellipta [Fluticasone Furoate-Vilanterol] Hives  . Dexilant [Dexlansoprazole] Diarrhea    Cramps   . Omnaris [Ciclesonide] Other (See Comments)    Nose bleed   . Spiriva Handihaler [Tiotropium Bromide Monohydrate] Other (See Comments)    Hiccups     Immunization History  Administered Date(s) Administered  . Fluad Quad(high Dose 65+) 01/30/2019  . Influenza Split 02/24/2012, 02/26/2013, 02/21/2014, 02/04/2015  . Influenza Whole 03/31/2012  . Influenza, High Dose Seasonal PF 02/02/2017, 02/03/2018, 01/30/2019  . Influenza,inj,Quad PF,6+ Mos 02/04/2016  . Influenza-Unspecified 02/07/2017  . PFIZER SARS-COV-2 Vaccination 08/20/2019, 09/10/2019  . Pneumococcal Polysaccharide-23 08/30/2003, 07/30/2014    Past Medical History:  Diagnosis Date  . Allergic rhinitis   . Allergy   . Asthma   . Atrial fibrillation ([place])   . Bleeding gastric ulcer   . Blood transfusion without  reported diagnosis   . CAD (coronary artery disease) 07/24/2017  . Cataract    bilateral removed  . Chronic combined systolic and diastolic congestive heart failure ([place])   . Chronic renal insufficiency, stage III (moderate)   . COPD (chronic obstructive pulmonary disease) ([place])   . Diabetes mellitus without complication ([place])    no meds- boarderline  . Diastolic heart failure ([place])   . Duodenum ulcer    with perforation  . DVT of axillary vein, acute left ([place]) 10/24/2014  . Emphysema   . GERD  (gastroesophageal reflux disease)   . H/O hiatal hernia   . High cholesterol   . Hypertension   . Mixed hyperlipidemia   . Myocardial infarction ([place])   . Neuromuscular disorder ([place])    reports shaking, somedays "can't hold spoon" - generalized  shakiness, is going to talk to MD about it at next appt.   . Normal cardiac stress test    reports stress test was several yrs. ago /w [employer]. Dr. Rogelia Mire, WNL   . OSA (obstructive sleep apnea) 2007   CPAP- report of settings on paper chart, seen by [facility] - seen 07/2011   . PE (pulmonary embolism) 10/2011  . Prostate cancer ([place]) 1992   in remission.  s/p XRT, melanoma under left eye, left temple  . Recurrent upper respiratory infection (URI)    coughing from chronic sinusitis   . Renal stone    some passed spontaneous, & also had cystoscopy  . Shortness of breath on exertion   . Sleep apnea    wears c-pap    Tobacco History: Social History   Tobacco Use  Smoking Status Former Smoker  . Packs/day: 1.00  . Years: 60.00  . Pack years: 60.00  . Types: Cigarettes  . Quit date: 08/06/2011  . Years since quitting: 8.1  Smokeless Tobacco Never Used   Counseling given: Not Answered   Outpatient Medications Prior to Visit  Medication Sig Dispense Refill  . albuterol (PROVENTIL) (2.5 MG/3ML) 0.083% nebulizer solution Take 2.5 mg by nebulization 4 (four) times daily.    [name] Kitchen atorvastatin (LIPITOR) 10 MG tablet Take 1 tablet (10 mg total) by mouth daily at 6 PM. 90 tablet 3  . budesonide-formoterol (SYMBICORT) 160-4.5 MCG/ACT inhaler Inhale 2 puffs into the lungs 2 (two) times daily. 10.2 g 5  . ELIQUIS 5 MG TABS tablet TAKE 1 TABLET(5 MG) BY MOUTH TWICE DAILY 60 tablet 2  . esomeprazole (NEXIUM) 40 MG capsule Take 1 capsule (40 mg total) by mouth daily. 90 capsule 6  . ferrous sulfate 220 (44 Fe) MG/5ML solution Take 220 mg by mouth every other day.    . fluocinonide cream (LIDEX) [id number] % Apply 1 application topically daily as needed  (rash). To infected area.    . fluticasone (FLONASE) 50 MCG/ACT nasal spray Place 2 sprays into the nose at bedtime. Each nostril once a day.    . furosemide (LASIX) 40 MG tablet Take 1 tablet (40 mg total) by mouth daily. 90 tablet 3  . HYDROcodone-acetaminophen (NORCO/VICODIN) 5-325 MG tablet hydrocodone 5 mg-acetaminophen 325 mg tablet    . ketoconazole (NIZORAL) 2 % cream Apply to both feet once daily for 6 weeks. 30 g 1  . LORazepam (ATIVAN) 1 MG tablet lorazepam 1 mg tablet    . metoprolol tartrate (LOPRESSOR) 25 MG tablet Take 0.5 tablets (12.5 mg total) by mouth 2 (two) times daily. 90 tablet 3  . nitroGLYCERIN (NITROSTAT) 0.4 MG SL tablet Place 1  tablet (0.4 mg total) under the tongue every 5 (five) minutes as needed for chest pain. 25 tablet 5  . PROAIR HFA 108 (90 Base) MCG/ACT inhaler INHALE 2 PUFFS BY MOUTH EVERY 6 HOURS AS NEEDED FOR WHEEZING OR SHORTNESS OF BREATH 8.5 g 6  . tamsulosin (FLOMAX) 0.4 MG CAPS capsule Take 0.4 mg by mouth daily.    [name] Kitchen tiZANidine (ZANAFLEX) 4 MG tablet TK 1 T PO HS PRN    . ipratropium (ATROVENT) 0.02 % nebulizer solution Take 2.5 mLs (0.5 mg total) by nebulization 4 (four) times daily. 300 mL 2   No facility-administered medications prior to visit.     Review of Systems:   Constitutional:   No  weight loss, night sweats,  Fevers, chills, + fatigue, or  lassitude.  HEENT:   No headaches,  Difficulty swallowing,  Tooth/dental problems, or  Sore throat,                No sneezing, itching, ear ache, nasal congestion, post nasal drip,  +chronic hoarseness   CV:  No chest pain,  Orthopnea, PND, swelling in lower extremities, anasarca, dizziness, palpitations, syncope.   GI  No heartburn, indigestion, abdominal pain, nausea, vomiting, diarrhea, change in bowel habits, loss of appetite, bloody stools.   Resp:  .  No chest wall deformity  Skin: no rash or lesions.  GU: no dysuria, change in color of urine, no urgency or frequency.  No flank pain,  no hematuria   MS:  No joint pain or swelling.  No decreased range of motion.  No back pain.    Physical Exam  BP 110/60 (BP Location: Left Arm, Cuff Size: Normal)   Pulse 96   Temp 97.6 F (36.4 C) (Temporal)   Ht 5\' 8"  (1.727 m)   Wt 175 lb 9.6 oz (79.7 kg)   SpO2 95%   BMI 26.70 kg/m   GEN: A/Ox3; pleasant , NAD, elderly    HEENT:  [place]/AT,    NOSE-clear, THROAT-clear, no lesions, no postnasal drip or exudate noted.   NECK:  Supple w/ fair ROM; no JVD; normal carotid impulses w/o bruits; no thyromegaly or nodules palpated; no lymphadenopathy.    RESP  Clear  P & A; w/o, wheezes/ rales/ or rhonchi. no accessory muscle use, no dullness to percussion  CARD:  RRR, no m/r/g, 1+ peripheral edema, pulses intact, no cyanosis or clubbing.  GI:   Soft & nt; nml bowel sounds; no organomegaly or masses detected.   Musco: Warm bil, no deformities or joint swelling noted.   Neuro: alert, no focal deficits noted.    Skin: Warm, no lesions or rashes    Lab Results:  CBC  No results found.    No flowsheet data found.  No results found for: NITRICOXIDE      Assessment & Plan:   COPD (chronic obstructive pulmonary disease) Controlled on current regimen   Plan  Patient Instructions  Continue on Symbicort 2 puffs twice daily, rinse after use Continue on  Ipratropium nebulizer 3 times daily Mucinex DM Twice daily  As needed  Cough/congestion  Continue on CPAP at bedtime.  Continue on Oxygen 2l/m with activity .  Follow-up in 4 -6 months with Dr. Elsworth Soho and as needed        OSA (obstructive sleep apnea) Controlled on CPAP At bedtime    Plan  Patient Instructions  Continue on Symbicort 2 puffs twice daily, rinse after use Continue on  Ipratropium nebulizer 3 times daily  Mucinex DM Twice daily  As needed  Cough/congestion  Continue on CPAP at bedtime.  Continue on Oxygen 2l/m with activity .  Follow-up in 4 -6 months with Dr. Elsworth Soho and as needed       '  Chronic combined systolic and diastolic congestive heart failure ([place]) Appears euvolemic on exam .   Cont on current regimen   Chronic respiratory failure with hypoxia (HCC) Cont on O2 2l/m with activity . - goal O2 sats >88-90%.       Rexene Edison, NP 10/01/2019

## 2019-10-01 NOTE — Assessment & Plan Note (Signed)
Controlled on CPAP At bedtime    Plan  Patient Instructions  Continue on Symbicort 2 puffs twice daily, rinse after use Continue on  Ipratropium nebulizer 3 times daily Mucinex DM Twice daily  As needed  Cough/congestion  Continue on CPAP at bedtime.  Continue on Oxygen 2l/m with activity .  Follow-up in 4 -6 months with Dr. Elsworth Soho and as needed      '

## 2019-10-01 NOTE — Assessment & Plan Note (Signed)
Cont on O2 2l/m with activity . - goal O2 sats >88-90%.

## 2019-10-01 NOTE — Assessment & Plan Note (Signed)
Appears euvolemic on exam  Cont on current regimen  

## 2019-10-01 NOTE — Assessment & Plan Note (Signed)
Controlled on current regimen   Plan  Patient Instructions  Continue on Symbicort 2 puffs twice daily, rinse after use Continue on  Ipratropium nebulizer 3 times daily Mucinex DM Twice daily  As needed  Cough/congestion  Continue on CPAP at bedtime.  Continue on Oxygen 2l/m with activity .  Follow-up in 4 -6 months with Dr. Elsworth Soho and as needed

## 2019-10-02 ENCOUNTER — Ambulatory Visit (INDEPENDENT_AMBULATORY_CARE_PROVIDER_SITE_OTHER): Payer: Medicare Other | Admitting: Podiatry

## 2019-10-02 VITALS — Temp 97.4°F

## 2019-10-02 DIAGNOSIS — B351 Tinea unguium: Secondary | ICD-10-CM

## 2019-10-02 DIAGNOSIS — M79675 Pain in left toe(s): Secondary | ICD-10-CM | POA: Diagnosis not present

## 2019-10-02 DIAGNOSIS — M79674 Pain in right toe(s): Secondary | ICD-10-CM

## 2019-10-02 DIAGNOSIS — E1151 Type 2 diabetes mellitus with diabetic peripheral angiopathy without gangrene: Secondary | ICD-10-CM

## 2019-10-02 NOTE — Patient Instructions (Signed)
Diabetes Mellitus and Foot Care Foot care is an important part of your health, especially when you have diabetes. Diabetes may cause you to have problems because of poor blood flow (circulation) to your feet and legs, which can cause your skin to:  Become thinner and drier.  Break more easily.  Heal more slowly.  Peel and crack. You may also have nerve damage (neuropathy) in your legs and feet, causing decreased feeling in them. This means that you may not notice minor injuries to your feet that could lead to more serious problems. Noticing and addressing any potential problems early is the best way to prevent future foot problems. How to care for your feet Foot hygiene  Wash your feet daily with warm water and mild soap. Do not use hot water. Then, pat your feet and the areas between your toes until they are completely dry. Do not soak your feet as this can dry your skin.  Trim your toenails straight across. Do not dig under them or around the cuticle. File the edges of your nails with an emery board or nail file.  Apply a moisturizing lotion or petroleum jelly to the skin on your feet and to dry, brittle toenails. Use lotion that does not contain alcohol and is unscented. Do not apply lotion between your toes. Shoes and socks  Wear clean socks or stockings every day. Make sure they are not too tight. Do not wear knee-high stockings since they may decrease blood flow to your legs.  Wear shoes that fit properly and have enough cushioning. Always look in your shoes before you put them on to be sure there are no objects inside.  To break in new shoes, wear them for just a few hours a day. This prevents injuries on your feet. Wounds, scrapes, corns, and calluses  Check your feet daily for blisters, cuts, bruises, sores, and redness. If you cannot see the bottom of your feet, use a mirror or ask someone for help.  Do not cut corns or calluses or try to remove them with medicine.  If you  find a minor scrape, cut, or break in the skin on your feet, keep it and the skin around it clean and dry. You may clean these areas with mild soap and water. Do not clean the area with peroxide, alcohol, or iodine.  If you have a wound, scrape, corn, or callus on your foot, look at it several times a day to make sure it is healing and not infected. Check for: ? Redness, swelling, or pain. ? Fluid or blood. ? Warmth. ? Pus or a bad smell. General instructions  Do not cross your legs. This may decrease blood flow to your feet.  Do not use heating pads or hot water bottles on your feet. They may burn your skin. If you have lost feeling in your feet or legs, you may not know this is happening until it is too late.  Protect your feet from hot and cold by wearing shoes, such as at the beach or on hot pavement.  Schedule a complete foot exam at least once a year (annually) or more often if you have foot problems. If you have foot problems, report any cuts, sores, or bruises to your health care provider immediately. Contact a health care provider if:  You have a medical condition that increases your risk of infection and you have any cuts, sores, or bruises on your feet.  You have an injury that is not   healing.  You have redness on your legs or feet.  You feel burning or tingling in your legs or feet.  You have pain or cramps in your legs and feet.  Your legs or feet are numb.  Your feet always feel cold.  You have pain around a toenail. Get help right away if:  You have a wound, scrape, corn, or callus on your foot and: ? You have pain, swelling, or redness that gets worse. ? You have fluid or blood coming from the wound, scrape, corn, or callus. ? Your wound, scrape, corn, or callus feels warm to the touch. ? You have pus or a bad smell coming from the wound, scrape, corn, or callus. ? You have a fever. ? You have a red line going up your leg. Summary  Check your feet every day  for cuts, sores, red spots, swelling, and blisters.  Moisturize feet and legs daily.  Wear shoes that fit properly and have enough cushioning.  If you have foot problems, report any cuts, sores, or bruises to your health care provider immediately.  Schedule a complete foot exam at least once a year (annually) or more often if you have foot problems. This information is not intended to replace advice given to you by your health care provider. Make sure you discuss any questions you have with your health care provider. Document Revised: 02/07/2019 Document Reviewed: 06/18/2016 Elsevier Patient Education  2020 Elsevier Inc.  Edema  Edema is an abnormal buildup of fluids in the body tissues and under the skin. Swelling of the legs, feet, and ankles is a common symptom that becomes more likely as you get older. Swelling is also common in looser tissues, like around the eyes. When the affected area is squeezed, the fluid may move out of that spot and leave a dent for a few moments. This dent is called pitting edema. There are many possible causes of edema. Eating too much salt (sodium) and being on your feet or sitting for a long time can cause edema in your legs, feet, and ankles. Hot weather may make edema worse. Common causes of edema include:  Heart failure.  Liver or kidney disease.  Weak leg blood vessels.  Cancer.  An injury.  Pregnancy.  Medicines.  Being obese.  Low protein levels in the blood. Edema is usually painless. Your skin may look swollen or shiny. Follow these instructions at home:  Keep the affected body part raised (elevated) above the level of your heart when you are sitting or lying down.  Do not sit still or stand for long periods of time.  Do not wear tight clothing. Do not wear garters on your upper legs.  Exercise your legs to get your circulation going. This helps to move the fluid back into your blood vessels, and it may help the swelling go  down.  Wear elastic bandages or support stockings to reduce swelling as told by your health care provider.  Eat a low-salt (low-sodium) diet to reduce fluid as told by your health care provider.  Depending on the cause of your swelling, you may need to limit how much fluid you drink (fluid restriction).  Take over-the-counter and prescription medicines only as told by your health care provider. Contact a health care provider if:  Your edema does not get better with treatment.  You have heart, liver, or kidney disease and have symptoms of edema.  You have sudden and unexplained weight gain. Get help right away if:    You develop shortness of breath or chest pain.  You cannot breathe when you lie down.  You develop pain, redness, or warmth in the swollen areas.  You have heart, liver, or kidney disease and suddenly get edema.  You have a fever and your symptoms suddenly get worse. Summary  Edema is an abnormal buildup of fluids in the body tissues and under the skin.  Eating too much salt (sodium) and being on your feet or sitting for a long time can cause edema in your legs, feet, and ankles.  Keep the affected body part raised (elevated) above the level of your heart when you are sitting or lying down. This information is not intended to replace advice given to you by your health care provider. Make sure you discuss any questions you have with your health care provider. Document Revised: 10/04/2018 Document Reviewed: 06/19/2016 Elsevier Patient Education  2020 Elsevier Inc.  

## 2019-10-07 ENCOUNTER — Encounter: Payer: Self-pay | Admitting: Podiatry

## 2019-10-07 NOTE — Progress Notes (Signed)
Subjective: Jon White presents today at risk foot care. Pt has h/o NIDDM with chronic kidney disease and painful mycotic nails b/l that are difficult to trim. Pain interferes with ambulation. Aggravating factors include wearing enclosed shoe gear. Pain is relieved with periodic professional debridement.  Jon White voices no new pedal problems on today's visit.  Jon Seashore, MD is patient's PCP. Last visit was: 04/19/2019.  Medications reviewed in chart.  Allergies  Allergen Reactions  . Mometasone Furoate Other (See Comments)    Nasonex: CAUSED RED,ITCHY EYES; "lost my eyelashes"   . Naproxen Sodium Nausea And Vomiting  . Nsaids Other (See Comments)    Stomach ulcers  . Anoro Ellipta [Umeclidinium-Vilanterol] Other (See Comments)    Double vision  . Aspirin Hives and Nausea And Vomiting     Chills, sweats  . Bevespi Aerosphere [Glycopyrrolate-Formoterol] Hives  . Pulmicort [Budesonide] Hives  . Breo Ellipta [Fluticasone Furoate-Vilanterol] Hives  . Dexilant [Dexlansoprazole] Diarrhea    Cramps   . Omnaris [Ciclesonide] Other (See Comments)    Nose bleed   . Spiriva Handihaler [Tiotropium Bromide Monohydrate] Other (See Comments)    Hiccups     Objective: Vitals:   10/02/19 1503  Temp: (!) 97.4 F (36.3 C)    Vascular Examination: Neurovascular status unchanged b/l. Capillary fill time to digits <3 seconds b/l. Faintly palpable DP pulses b/l. Nonpalpable PT pulses b/l. Pedal hair absent b/l Skin temperature gradient within normal limits b/l. +1 pitting edema b/l LE.  Dermatological Examination: Pedal skin with normal turgor, texture and tone bilaterally. No open wounds bilaterally. No interdigital macerations bilaterally. Toenails 1-5 b/l elongated, dystrophic, thickened, crumbly with subungual debris and tenderness to dorsal palpation.  Musculoskeletal: Normal muscle strength 5/5 to all lower extremity muscle groups bilaterally. No gross bony deformities  bilaterally.  Neurological Examination: Protective sensation intact 5/5 intact bilaterally with 10g monofilament b/l. Proprioception intact bilaterally.  Assessment: 1. Pain due to onychomycosis of toenails of both feet   2. Diabetes mellitus type 2 with peripheral artery disease ([place])    Plan: -Examined patient. -No new findings. No new orders. -Continue diabetic foot care principles. Literature dispensed on today.  -Toenails 1-5 b/l were debrided in length and girth with sterile nail nippers and dremel without iatrogenic bleeding.  -Patient to continue soft, supportive shoe gear daily. -Patient to report any pedal injuries to medical professional immediately. -Patient/POA to call should there be question/concern in the interim.  Return in about 3 months (around 01/02/2020) for diabetic nail trim.

## 2019-10-26 DIAGNOSIS — S41102A Unspecified open wound of left upper arm, initial encounter: Secondary | ICD-10-CM | POA: Diagnosis not present

## 2019-10-26 DIAGNOSIS — L03114 Cellulitis of left upper limb: Secondary | ICD-10-CM | POA: Diagnosis not present

## 2019-10-26 DIAGNOSIS — R2232 Localized swelling, mass and lump, left upper limb: Secondary | ICD-10-CM | POA: Diagnosis not present

## 2019-11-01 DIAGNOSIS — L03114 Cellulitis of left upper limb: Secondary | ICD-10-CM | POA: Diagnosis not present

## 2019-11-01 DIAGNOSIS — E119 Type 2 diabetes mellitus without complications: Secondary | ICD-10-CM | POA: Diagnosis not present

## 2019-11-01 DIAGNOSIS — I10 Essential (primary) hypertension: Secondary | ICD-10-CM | POA: Diagnosis not present

## 2019-11-01 DIAGNOSIS — E782 Mixed hyperlipidemia: Secondary | ICD-10-CM | POA: Diagnosis not present

## 2019-11-01 DIAGNOSIS — Z0000 Encounter for general adult medical examination without abnormal findings: Secondary | ICD-10-CM | POA: Diagnosis not present

## 2019-11-08 DIAGNOSIS — I5042 Chronic combined systolic (congestive) and diastolic (congestive) heart failure: Secondary | ICD-10-CM | POA: Diagnosis not present

## 2019-11-08 DIAGNOSIS — I10 Essential (primary) hypertension: Secondary | ICD-10-CM | POA: Diagnosis not present

## 2019-11-08 DIAGNOSIS — E119 Type 2 diabetes mellitus without complications: Secondary | ICD-10-CM | POA: Diagnosis not present

## 2019-11-08 DIAGNOSIS — I214 Non-ST elevation (NSTEMI) myocardial infarction: Secondary | ICD-10-CM | POA: Diagnosis not present

## 2019-11-08 DIAGNOSIS — I700 Atherosclerosis of aorta: Secondary | ICD-10-CM | POA: Diagnosis not present

## 2019-11-08 DIAGNOSIS — J449 Chronic obstructive pulmonary disease, unspecified: Secondary | ICD-10-CM | POA: Diagnosis not present

## 2019-11-08 DIAGNOSIS — I130 Hypertensive heart and chronic kidney disease with heart failure and stage 1 through stage 4 chronic kidney disease, or unspecified chronic kidney disease: Secondary | ICD-10-CM | POA: Diagnosis not present

## 2019-11-08 DIAGNOSIS — N1831 Chronic kidney disease, stage 3a: Secondary | ICD-10-CM | POA: Diagnosis not present

## 2019-11-08 DIAGNOSIS — G609 Hereditary and idiopathic neuropathy, unspecified: Secondary | ICD-10-CM | POA: Diagnosis not present

## 2019-11-08 DIAGNOSIS — L03114 Cellulitis of left upper limb: Secondary | ICD-10-CM | POA: Diagnosis not present

## 2019-11-08 DIAGNOSIS — E782 Mixed hyperlipidemia: Secondary | ICD-10-CM | POA: Diagnosis not present

## 2019-11-08 DIAGNOSIS — I2510 Atherosclerotic heart disease of native coronary artery without angina pectoris: Secondary | ICD-10-CM | POA: Diagnosis not present

## 2019-11-20 ENCOUNTER — Other Ambulatory Visit: Payer: Self-pay | Admitting: Pulmonary Disease

## 2019-12-18 DIAGNOSIS — H35363 Drusen (degenerative) of macula, bilateral: Secondary | ICD-10-CM | POA: Diagnosis not present

## 2019-12-18 DIAGNOSIS — H04123 Dry eye syndrome of bilateral lacrimal glands: Secondary | ICD-10-CM | POA: Diagnosis not present

## 2019-12-18 DIAGNOSIS — H26499 Other secondary cataract, unspecified eye: Secondary | ICD-10-CM | POA: Diagnosis not present

## 2019-12-18 DIAGNOSIS — H35722 Serous detachment of retinal pigment epithelium, left eye: Secondary | ICD-10-CM | POA: Diagnosis not present

## 2019-12-31 ENCOUNTER — Other Ambulatory Visit: Payer: Self-pay | Admitting: Pulmonary Disease

## 2019-12-31 DIAGNOSIS — J328 Other chronic sinusitis: Secondary | ICD-10-CM

## 2019-12-31 DIAGNOSIS — R05 Cough: Secondary | ICD-10-CM

## 2019-12-31 DIAGNOSIS — R058 Other specified cough: Secondary | ICD-10-CM

## 2019-12-31 DIAGNOSIS — J449 Chronic obstructive pulmonary disease, unspecified: Secondary | ICD-10-CM

## 2019-12-31 MED ORDER — ALBUTEROL SULFATE (2.5 MG/3ML) 0.083% IN NEBU: 2.5000 mg | INHALATION_SOLUTION | Freq: Four times a day (QID) | RESPIRATORY_TRACT | 1 refills | Status: DC

## 2020-01-02 ENCOUNTER — Telehealth: Payer: Self-pay

## 2020-01-02 ENCOUNTER — Ambulatory Visit: Payer: Medicare Other | Admitting: Podiatry

## 2020-01-02 NOTE — Telephone Encounter (Signed)
PA request received from [facility]  Drug requested: Symbicort 160 CMM Key: [id number] Tried/failed: on file Covered alternatives:  PA request has been sent to plan, and a determination is expected within 3 days.   Routing to [place] for follow-up.

## 2020-01-03 ENCOUNTER — Telehealth: Payer: Self-pay | Admitting: Pulmonary Disease

## 2020-01-03 NOTE — Telephone Encounter (Signed)
Checked Cover My Meds. PA received a N/A determination >> This medication or product is on your plan's list of covered drugs. Prior authorization is not required at this time.

## 2020-01-03 NOTE — Telephone Encounter (Signed)
Spoke with pt who stated that [facility] had refused refill stating RA had D/C'd med. Spoke with [facility] and informed them medication had not been D/C'd and [facility] then filled medication. Left VM for pt stating this. Nothing further needed at this time.

## 2020-01-05 ENCOUNTER — Other Ambulatory Visit: Payer: Self-pay | Admitting: Adult Health

## 2020-01-09 ENCOUNTER — Other Ambulatory Visit: Payer: Self-pay

## 2020-01-09 ENCOUNTER — Other Ambulatory Visit: Payer: Self-pay | Admitting: Pulmonary Disease

## 2020-01-09 DIAGNOSIS — R058 Other specified cough: Secondary | ICD-10-CM

## 2020-01-09 DIAGNOSIS — J328 Other chronic sinusitis: Secondary | ICD-10-CM

## 2020-01-09 DIAGNOSIS — J449 Chronic obstructive pulmonary disease, unspecified: Secondary | ICD-10-CM

## 2020-01-09 MED ORDER — ALBUTEROL SULFATE (2.5 MG/3ML) 0.083% IN NEBU: 2.5000 mg | INHALATION_SOLUTION | Freq: Four times a day (QID) | RESPIRATORY_TRACT | 2 refills | Status: AC

## 2020-01-27 ENCOUNTER — Other Ambulatory Visit: Payer: Self-pay | Admitting: Pulmonary Disease

## 2020-01-31 ENCOUNTER — Other Ambulatory Visit: Payer: Self-pay | Admitting: Pulmonary Disease

## 2020-02-09 ENCOUNTER — Encounter: Payer: Self-pay | Admitting: *Deleted

## 2020-02-11 ENCOUNTER — Telehealth: Payer: Self-pay | Admitting: Pulmonary Disease

## 2020-02-11 MED ORDER — BUDESONIDE-FORMOTEROL FUMARATE 160-4.5 MCG/ACT IN AERO: 2.0000 | INHALATION_SPRAY | Freq: Two times a day (BID) | RESPIRATORY_TRACT | 5 refills | Status: AC

## 2020-02-11 NOTE — Telephone Encounter (Signed)
Spoke with the pt  He is needing refill on his symbicort  Appt scheduled with Dr Elsworth Soho and med refilled to last until then

## 2020-02-21 DIAGNOSIS — G4733 Obstructive sleep apnea (adult) (pediatric): Secondary | ICD-10-CM | POA: Diagnosis not present

## 2020-02-21 DIAGNOSIS — D539 Nutritional anemia, unspecified: Secondary | ICD-10-CM | POA: Diagnosis present

## 2020-02-21 DIAGNOSIS — D696 Thrombocytopenia, unspecified: Secondary | ICD-10-CM | POA: Diagnosis not present

## 2020-02-21 DIAGNOSIS — J939 Pneumothorax, unspecified: Secondary | ICD-10-CM | POA: Diagnosis not present

## 2020-02-21 DIAGNOSIS — R140 Abdominal distension (gaseous): Secondary | ICD-10-CM | POA: Diagnosis not present

## 2020-02-21 DIAGNOSIS — I350 Nonrheumatic aortic (valve) stenosis: Secondary | ICD-10-CM | POA: Diagnosis not present

## 2020-02-21 DIAGNOSIS — N179 Acute kidney failure, unspecified: Secondary | ICD-10-CM | POA: Diagnosis present

## 2020-02-21 DIAGNOSIS — D509 Iron deficiency anemia, unspecified: Secondary | ICD-10-CM | POA: Diagnosis not present

## 2020-02-21 DIAGNOSIS — R7982 Elevated C-reactive protein (CRP): Secondary | ICD-10-CM | POA: Diagnosis not present

## 2020-02-21 DIAGNOSIS — I420 Dilated cardiomyopathy: Secondary | ICD-10-CM | POA: Diagnosis not present

## 2020-02-21 DIAGNOSIS — J441 Chronic obstructive pulmonary disease with (acute) exacerbation: Secondary | ICD-10-CM | POA: Diagnosis not present

## 2020-02-21 DIAGNOSIS — Z79899 Other long term (current) drug therapy: Secondary | ICD-10-CM | POA: Diagnosis not present

## 2020-02-21 DIAGNOSIS — I482 Chronic atrial fibrillation, unspecified: Secondary | ICD-10-CM | POA: Diagnosis present

## 2020-02-21 DIAGNOSIS — I4891 Unspecified atrial fibrillation: Secondary | ICD-10-CM | POA: Diagnosis not present

## 2020-02-21 DIAGNOSIS — E875 Hyperkalemia: Secondary | ICD-10-CM | POA: Diagnosis not present

## 2020-02-21 DIAGNOSIS — U071 COVID-19: Secondary | ICD-10-CM | POA: Diagnosis not present

## 2020-02-21 DIAGNOSIS — K219 Gastro-esophageal reflux disease without esophagitis: Secondary | ICD-10-CM | POA: Diagnosis present

## 2020-02-21 DIAGNOSIS — Z4682 Encounter for fitting and adjustment of non-vascular catheter: Secondary | ICD-10-CM | POA: Diagnosis not present

## 2020-02-21 DIAGNOSIS — Z515 Encounter for palliative care: Secondary | ICD-10-CM | POA: Diagnosis not present

## 2020-02-21 DIAGNOSIS — I2510 Atherosclerotic heart disease of native coronary artery without angina pectoris: Secondary | ICD-10-CM | POA: Diagnosis present

## 2020-02-21 DIAGNOSIS — I872 Venous insufficiency (chronic) (peripheral): Secondary | ICD-10-CM | POA: Diagnosis present

## 2020-02-21 DIAGNOSIS — I10 Essential (primary) hypertension: Secondary | ICD-10-CM | POA: Diagnosis not present

## 2020-02-21 DIAGNOSIS — K209 Esophagitis, unspecified without bleeding: Secondary | ICD-10-CM | POA: Diagnosis not present

## 2020-02-21 DIAGNOSIS — Z6824 Body mass index (BMI) 24.0-24.9, adult: Secondary | ICD-10-CM | POA: Diagnosis not present

## 2020-02-21 DIAGNOSIS — R918 Other nonspecific abnormal finding of lung field: Secondary | ICD-10-CM | POA: Diagnosis not present

## 2020-02-21 DIAGNOSIS — E860 Dehydration: Secondary | ICD-10-CM | POA: Diagnosis present

## 2020-02-21 DIAGNOSIS — J9602 Acute respiratory failure with hypercapnia: Secondary | ICD-10-CM | POA: Diagnosis not present

## 2020-02-21 DIAGNOSIS — J90 Pleural effusion, not elsewhere classified: Secondary | ICD-10-CM | POA: Diagnosis not present

## 2020-02-21 DIAGNOSIS — I110 Hypertensive heart disease with heart failure: Secondary | ICD-10-CM | POA: Diagnosis present

## 2020-02-21 DIAGNOSIS — I509 Heart failure, unspecified: Secondary | ICD-10-CM | POA: Diagnosis not present

## 2020-02-21 DIAGNOSIS — J9601 Acute respiratory failure with hypoxia: Secondary | ICD-10-CM | POA: Diagnosis not present

## 2020-02-21 DIAGNOSIS — I5032 Chronic diastolic (congestive) heart failure: Secondary | ICD-10-CM | POA: Diagnosis present

## 2020-02-21 DIAGNOSIS — J1282 Pneumonia due to coronavirus disease 2019: Secondary | ICD-10-CM | POA: Diagnosis not present

## 2020-02-21 DIAGNOSIS — R627 Adult failure to thrive: Secondary | ICD-10-CM | POA: Diagnosis present

## 2020-02-21 DIAGNOSIS — J449 Chronic obstructive pulmonary disease, unspecified: Secondary | ICD-10-CM | POA: Diagnosis not present

## 2020-02-21 DIAGNOSIS — J9690 Respiratory failure, unspecified, unspecified whether with hypoxia or hypercapnia: Secondary | ICD-10-CM | POA: Diagnosis not present

## 2020-02-21 DIAGNOSIS — E785 Hyperlipidemia, unspecified: Secondary | ICD-10-CM | POA: Diagnosis present

## 2020-02-21 DIAGNOSIS — Z9981 Dependence on supplemental oxygen: Secondary | ICD-10-CM | POA: Diagnosis not present

## 2020-02-21 DIAGNOSIS — Z7901 Long term (current) use of anticoagulants: Secondary | ICD-10-CM | POA: Diagnosis not present

## 2020-02-21 DIAGNOSIS — R0902 Hypoxemia: Secondary | ICD-10-CM | POA: Diagnosis not present

## 2020-02-21 DIAGNOSIS — K439 Ventral hernia without obstruction or gangrene: Secondary | ICD-10-CM | POA: Diagnosis not present

## 2020-02-21 DIAGNOSIS — I252 Old myocardial infarction: Secondary | ICD-10-CM | POA: Diagnosis not present

## 2020-02-21 DIAGNOSIS — E1122 Type 2 diabetes mellitus with diabetic chronic kidney disease: Secondary | ICD-10-CM | POA: Diagnosis present

## 2020-02-21 DIAGNOSIS — J811 Chronic pulmonary edema: Secondary | ICD-10-CM | POA: Diagnosis not present

## 2020-02-21 DIAGNOSIS — J439 Emphysema, unspecified: Secondary | ICD-10-CM | POA: Diagnosis not present

## 2020-02-21 DIAGNOSIS — E119 Type 2 diabetes mellitus without complications: Secondary | ICD-10-CM | POA: Diagnosis present

## 2020-02-21 DIAGNOSIS — D72829 Elevated white blood cell count, unspecified: Secondary | ICD-10-CM | POA: Diagnosis not present

## 2020-02-21 DIAGNOSIS — R05 Cough: Secondary | ICD-10-CM | POA: Diagnosis not present

## 2020-02-22 DIAGNOSIS — J1282 Pneumonia due to coronavirus disease 2019: Secondary | ICD-10-CM | POA: Diagnosis not present

## 2020-02-22 DIAGNOSIS — Z9981 Dependence on supplemental oxygen: Secondary | ICD-10-CM | POA: Diagnosis not present

## 2020-02-22 DIAGNOSIS — J441 Chronic obstructive pulmonary disease with (acute) exacerbation: Secondary | ICD-10-CM | POA: Diagnosis not present

## 2020-02-22 DIAGNOSIS — J449 Chronic obstructive pulmonary disease, unspecified: Secondary | ICD-10-CM | POA: Diagnosis not present

## 2020-02-22 DIAGNOSIS — J9601 Acute respiratory failure with hypoxia: Secondary | ICD-10-CM | POA: Diagnosis not present

## 2020-02-22 DIAGNOSIS — I4891 Unspecified atrial fibrillation: Secondary | ICD-10-CM | POA: Diagnosis not present

## 2020-02-22 DIAGNOSIS — U071 COVID-19: Secondary | ICD-10-CM | POA: Diagnosis not present

## 2020-02-23 DIAGNOSIS — J9690 Respiratory failure, unspecified, unspecified whether with hypoxia or hypercapnia: Secondary | ICD-10-CM | POA: Diagnosis not present

## 2020-02-23 DIAGNOSIS — J441 Chronic obstructive pulmonary disease with (acute) exacerbation: Secondary | ICD-10-CM | POA: Diagnosis not present

## 2020-02-23 DIAGNOSIS — J90 Pleural effusion, not elsewhere classified: Secondary | ICD-10-CM | POA: Diagnosis not present

## 2020-02-23 DIAGNOSIS — U071 COVID-19: Secondary | ICD-10-CM | POA: Diagnosis not present

## 2020-02-23 DIAGNOSIS — Z9981 Dependence on supplemental oxygen: Secondary | ICD-10-CM | POA: Diagnosis not present

## 2020-02-23 DIAGNOSIS — R918 Other nonspecific abnormal finding of lung field: Secondary | ICD-10-CM | POA: Diagnosis not present

## 2020-02-23 DIAGNOSIS — I4891 Unspecified atrial fibrillation: Secondary | ICD-10-CM | POA: Diagnosis not present

## 2020-02-23 DIAGNOSIS — J811 Chronic pulmonary edema: Secondary | ICD-10-CM | POA: Diagnosis not present

## 2020-02-23 DIAGNOSIS — J9601 Acute respiratory failure with hypoxia: Secondary | ICD-10-CM | POA: Diagnosis not present

## 2020-02-24 DIAGNOSIS — I4891 Unspecified atrial fibrillation: Secondary | ICD-10-CM | POA: Diagnosis not present

## 2020-02-24 DIAGNOSIS — J441 Chronic obstructive pulmonary disease with (acute) exacerbation: Secondary | ICD-10-CM | POA: Diagnosis not present

## 2020-02-24 DIAGNOSIS — Z9981 Dependence on supplemental oxygen: Secondary | ICD-10-CM | POA: Diagnosis not present

## 2020-02-24 DIAGNOSIS — U071 COVID-19: Secondary | ICD-10-CM | POA: Diagnosis not present

## 2020-02-24 DIAGNOSIS — J9601 Acute respiratory failure with hypoxia: Secondary | ICD-10-CM | POA: Diagnosis not present

## 2020-02-25 DIAGNOSIS — I4891 Unspecified atrial fibrillation: Secondary | ICD-10-CM | POA: Diagnosis not present

## 2020-02-25 DIAGNOSIS — J9601 Acute respiratory failure with hypoxia: Secondary | ICD-10-CM | POA: Diagnosis not present

## 2020-02-25 DIAGNOSIS — I420 Dilated cardiomyopathy: Secondary | ICD-10-CM | POA: Diagnosis not present

## 2020-02-25 DIAGNOSIS — I509 Heart failure, unspecified: Secondary | ICD-10-CM | POA: Diagnosis not present

## 2020-02-25 DIAGNOSIS — Z9981 Dependence on supplemental oxygen: Secondary | ICD-10-CM | POA: Diagnosis not present

## 2020-02-25 DIAGNOSIS — I252 Old myocardial infarction: Secondary | ICD-10-CM | POA: Diagnosis not present

## 2020-02-25 DIAGNOSIS — R7982 Elevated C-reactive protein (CRP): Secondary | ICD-10-CM | POA: Diagnosis not present

## 2020-02-25 DIAGNOSIS — J441 Chronic obstructive pulmonary disease with (acute) exacerbation: Secondary | ICD-10-CM | POA: Diagnosis not present

## 2020-02-25 DIAGNOSIS — J1282 Pneumonia due to coronavirus disease 2019: Secondary | ICD-10-CM | POA: Diagnosis not present

## 2020-02-25 DIAGNOSIS — I350 Nonrheumatic aortic (valve) stenosis: Secondary | ICD-10-CM | POA: Diagnosis not present

## 2020-02-25 DIAGNOSIS — U071 COVID-19: Secondary | ICD-10-CM | POA: Diagnosis not present

## 2020-02-25 DIAGNOSIS — I10 Essential (primary) hypertension: Secondary | ICD-10-CM | POA: Diagnosis not present

## 2020-02-26 DIAGNOSIS — J1282 Pneumonia due to coronavirus disease 2019: Secondary | ICD-10-CM | POA: Diagnosis not present

## 2020-02-26 DIAGNOSIS — I420 Dilated cardiomyopathy: Secondary | ICD-10-CM | POA: Diagnosis not present

## 2020-02-26 DIAGNOSIS — J441 Chronic obstructive pulmonary disease with (acute) exacerbation: Secondary | ICD-10-CM | POA: Diagnosis not present

## 2020-02-26 DIAGNOSIS — D72829 Elevated white blood cell count, unspecified: Secondary | ICD-10-CM | POA: Diagnosis not present

## 2020-02-26 DIAGNOSIS — K209 Esophagitis, unspecified without bleeding: Secondary | ICD-10-CM | POA: Diagnosis not present

## 2020-02-26 DIAGNOSIS — I350 Nonrheumatic aortic (valve) stenosis: Secondary | ICD-10-CM | POA: Diagnosis not present

## 2020-02-26 DIAGNOSIS — Z9981 Dependence on supplemental oxygen: Secondary | ICD-10-CM | POA: Diagnosis not present

## 2020-02-26 DIAGNOSIS — G4733 Obstructive sleep apnea (adult) (pediatric): Secondary | ICD-10-CM | POA: Diagnosis not present

## 2020-02-26 DIAGNOSIS — I252 Old myocardial infarction: Secondary | ICD-10-CM | POA: Diagnosis not present

## 2020-02-26 DIAGNOSIS — I10 Essential (primary) hypertension: Secondary | ICD-10-CM | POA: Diagnosis not present

## 2020-02-26 DIAGNOSIS — U071 COVID-19: Secondary | ICD-10-CM | POA: Diagnosis not present

## 2020-02-26 DIAGNOSIS — I509 Heart failure, unspecified: Secondary | ICD-10-CM | POA: Diagnosis not present

## 2020-02-26 DIAGNOSIS — J9601 Acute respiratory failure with hypoxia: Secondary | ICD-10-CM | POA: Diagnosis not present

## 2020-02-27 DIAGNOSIS — J441 Chronic obstructive pulmonary disease with (acute) exacerbation: Secondary | ICD-10-CM | POA: Diagnosis not present

## 2020-02-27 DIAGNOSIS — U071 COVID-19: Secondary | ICD-10-CM | POA: Diagnosis not present

## 2020-02-27 DIAGNOSIS — I420 Dilated cardiomyopathy: Secondary | ICD-10-CM | POA: Diagnosis not present

## 2020-02-27 DIAGNOSIS — Z9981 Dependence on supplemental oxygen: Secondary | ICD-10-CM | POA: Diagnosis not present

## 2020-02-27 DIAGNOSIS — D72829 Elevated white blood cell count, unspecified: Secondary | ICD-10-CM | POA: Diagnosis not present

## 2020-02-27 DIAGNOSIS — J9690 Respiratory failure, unspecified, unspecified whether with hypoxia or hypercapnia: Secondary | ICD-10-CM | POA: Diagnosis not present

## 2020-02-27 DIAGNOSIS — J90 Pleural effusion, not elsewhere classified: Secondary | ICD-10-CM | POA: Diagnosis not present

## 2020-02-27 DIAGNOSIS — J9601 Acute respiratory failure with hypoxia: Secondary | ICD-10-CM | POA: Diagnosis not present

## 2020-02-27 DIAGNOSIS — I509 Heart failure, unspecified: Secondary | ICD-10-CM | POA: Diagnosis not present

## 2020-02-27 DIAGNOSIS — J1282 Pneumonia due to coronavirus disease 2019: Secondary | ICD-10-CM | POA: Diagnosis not present

## 2020-02-27 DIAGNOSIS — G4733 Obstructive sleep apnea (adult) (pediatric): Secondary | ICD-10-CM | POA: Diagnosis not present

## 2020-02-28 DIAGNOSIS — U071 COVID-19: Secondary | ICD-10-CM | POA: Diagnosis not present

## 2020-02-28 DIAGNOSIS — J1282 Pneumonia due to coronavirus disease 2019: Secondary | ICD-10-CM | POA: Diagnosis not present

## 2020-02-28 DIAGNOSIS — J441 Chronic obstructive pulmonary disease with (acute) exacerbation: Secondary | ICD-10-CM | POA: Diagnosis not present

## 2020-02-28 DIAGNOSIS — D72829 Elevated white blood cell count, unspecified: Secondary | ICD-10-CM | POA: Diagnosis not present

## 2020-02-28 DIAGNOSIS — J9601 Acute respiratory failure with hypoxia: Secondary | ICD-10-CM | POA: Diagnosis not present

## 2020-02-28 DIAGNOSIS — Z9981 Dependence on supplemental oxygen: Secondary | ICD-10-CM | POA: Diagnosis not present

## 2020-02-28 DIAGNOSIS — G4733 Obstructive sleep apnea (adult) (pediatric): Secondary | ICD-10-CM | POA: Diagnosis not present

## 2020-02-29 DIAGNOSIS — J9601 Acute respiratory failure with hypoxia: Secondary | ICD-10-CM | POA: Diagnosis not present

## 2020-02-29 DIAGNOSIS — D72829 Elevated white blood cell count, unspecified: Secondary | ICD-10-CM | POA: Diagnosis not present

## 2020-02-29 DIAGNOSIS — J1282 Pneumonia due to coronavirus disease 2019: Secondary | ICD-10-CM | POA: Diagnosis not present

## 2020-02-29 DIAGNOSIS — U071 COVID-19: Secondary | ICD-10-CM | POA: Diagnosis not present

## 2020-02-29 DIAGNOSIS — J441 Chronic obstructive pulmonary disease with (acute) exacerbation: Secondary | ICD-10-CM | POA: Diagnosis not present

## 2020-02-29 DIAGNOSIS — Z9981 Dependence on supplemental oxygen: Secondary | ICD-10-CM | POA: Diagnosis not present

## 2020-03-03 DIAGNOSIS — R918 Other nonspecific abnormal finding of lung field: Secondary | ICD-10-CM | POA: Diagnosis not present

## 2020-03-09 DIAGNOSIS — Z9981 Dependence on supplemental oxygen: Secondary | ICD-10-CM | POA: Diagnosis not present

## 2020-03-09 DIAGNOSIS — U071 COVID-19: Secondary | ICD-10-CM | POA: Diagnosis not present

## 2020-03-09 DIAGNOSIS — J441 Chronic obstructive pulmonary disease with (acute) exacerbation: Secondary | ICD-10-CM | POA: Diagnosis not present

## 2020-03-09 DIAGNOSIS — J9601 Acute respiratory failure with hypoxia: Secondary | ICD-10-CM | POA: Diagnosis not present

## 2020-03-09 DIAGNOSIS — J1282 Pneumonia due to coronavirus disease 2019: Secondary | ICD-10-CM | POA: Diagnosis not present

## 2020-03-09 DIAGNOSIS — J939 Pneumothorax, unspecified: Secondary | ICD-10-CM | POA: Diagnosis not present

## 2020-03-10 DIAGNOSIS — J441 Chronic obstructive pulmonary disease with (acute) exacerbation: Secondary | ICD-10-CM | POA: Diagnosis not present

## 2020-03-10 DIAGNOSIS — Z9981 Dependence on supplemental oxygen: Secondary | ICD-10-CM | POA: Diagnosis not present

## 2020-03-10 DIAGNOSIS — U071 COVID-19: Secondary | ICD-10-CM | POA: Diagnosis not present

## 2020-03-10 DIAGNOSIS — J9601 Acute respiratory failure with hypoxia: Secondary | ICD-10-CM | POA: Diagnosis not present

## 2020-03-12 DIAGNOSIS — U071 COVID-19: Secondary | ICD-10-CM | POA: Diagnosis not present

## 2020-03-12 DIAGNOSIS — J939 Pneumothorax, unspecified: Secondary | ICD-10-CM | POA: Diagnosis not present

## 2020-03-13 DIAGNOSIS — J939 Pneumothorax, unspecified: Secondary | ICD-10-CM | POA: Diagnosis not present

## 2020-03-20 ENCOUNTER — Ambulatory Visit: Payer: Medicare Other | Admitting: Pulmonary Disease

## 2020-03-31 DEATH — deceased

## 2020-04-02 ENCOUNTER — Ambulatory Visit: Payer: Medicare Other | Admitting: Podiatry
